# Patient Record
Sex: Male | Born: 1945 | Race: White | Hispanic: No | Marital: Single | Smoking: Current every day smoker
Health system: Southern US, Community
[De-identification: ages and names within clinical notes are randomized; demographics above are authoritative.]

## PROBLEM LIST (undated history)

## (undated) DIAGNOSIS — J449 Chronic obstructive pulmonary disease, unspecified: Secondary | ICD-10-CM

## (undated) DIAGNOSIS — I10 Essential (primary) hypertension: Secondary | ICD-10-CM

## (undated) DIAGNOSIS — N183 Chronic kidney disease, stage 3 unspecified: Secondary | ICD-10-CM

## (undated) DIAGNOSIS — Z8719 Personal history of other diseases of the digestive system: Secondary | ICD-10-CM

## (undated) DIAGNOSIS — K449 Diaphragmatic hernia without obstruction or gangrene: Secondary | ICD-10-CM

## (undated) DIAGNOSIS — I2510 Atherosclerotic heart disease of native coronary artery without angina pectoris: Secondary | ICD-10-CM

## (undated) DIAGNOSIS — I739 Peripheral vascular disease, unspecified: Secondary | ICD-10-CM

## (undated) DIAGNOSIS — G709 Myoneural disorder, unspecified: Secondary | ICD-10-CM

## (undated) DIAGNOSIS — N401 Enlarged prostate with lower urinary tract symptoms: Secondary | ICD-10-CM

## (undated) DIAGNOSIS — K5730 Diverticulosis of large intestine without perforation or abscess without bleeding: Secondary | ICD-10-CM

## (undated) DIAGNOSIS — N189 Chronic kidney disease, unspecified: Secondary | ICD-10-CM

## (undated) DIAGNOSIS — T8859XA Other complications of anesthesia, initial encounter: Secondary | ICD-10-CM

## (undated) DIAGNOSIS — T4145XA Adverse effect of unspecified anesthetic, initial encounter: Secondary | ICD-10-CM

## (undated) DIAGNOSIS — Z87442 Personal history of urinary calculi: Secondary | ICD-10-CM

## (undated) DIAGNOSIS — R600 Localized edema: Secondary | ICD-10-CM

## (undated) DIAGNOSIS — K118 Other diseases of salivary glands: Secondary | ICD-10-CM

## (undated) DIAGNOSIS — I499 Cardiac arrhythmia, unspecified: Secondary | ICD-10-CM

## (undated) DIAGNOSIS — E785 Hyperlipidemia, unspecified: Secondary | ICD-10-CM

## (undated) DIAGNOSIS — G4730 Sleep apnea, unspecified: Secondary | ICD-10-CM

## (undated) DIAGNOSIS — Z87898 Personal history of other specified conditions: Secondary | ICD-10-CM

## (undated) DIAGNOSIS — Z860101 Personal history of adenomatous and serrated colon polyps: Secondary | ICD-10-CM

## (undated) DIAGNOSIS — I701 Atherosclerosis of renal artery: Secondary | ICD-10-CM

## (undated) DIAGNOSIS — R0600 Dyspnea, unspecified: Secondary | ICD-10-CM

## (undated) DIAGNOSIS — Z85820 Personal history of malignant melanoma of skin: Secondary | ICD-10-CM

## (undated) DIAGNOSIS — M1990 Unspecified osteoarthritis, unspecified site: Secondary | ICD-10-CM

## (undated) DIAGNOSIS — Z86010 Personal history of colonic polyps: Secondary | ICD-10-CM

## (undated) DIAGNOSIS — I493 Ventricular premature depolarization: Secondary | ICD-10-CM

## (undated) DIAGNOSIS — K219 Gastro-esophageal reflux disease without esophagitis: Secondary | ICD-10-CM

## (undated) DIAGNOSIS — I4891 Unspecified atrial fibrillation: Secondary | ICD-10-CM

## (undated) DIAGNOSIS — I714 Abdominal aortic aneurysm, without rupture, unspecified: Secondary | ICD-10-CM

## (undated) DIAGNOSIS — N200 Calculus of kidney: Secondary | ICD-10-CM

## (undated) DIAGNOSIS — M5030 Other cervical disc degeneration, unspecified cervical region: Secondary | ICD-10-CM

## (undated) DIAGNOSIS — T7840XA Allergy, unspecified, initial encounter: Secondary | ICD-10-CM

## (undated) DIAGNOSIS — H269 Unspecified cataract: Secondary | ICD-10-CM

## (undated) DIAGNOSIS — J45909 Unspecified asthma, uncomplicated: Secondary | ICD-10-CM

## (undated) HISTORY — PX: TONSILLECTOMY: SHX5217

## (undated) HISTORY — PX: COLONOSCOPY: SHX174

## (undated) HISTORY — DX: Essential (primary) hypertension: I10

## (undated) HISTORY — PX: POLYPECTOMY: SHX149

## (undated) HISTORY — DX: Hyperlipidemia, unspecified: E78.5

## (undated) HISTORY — PX: ILIAC ARTERY STENT: SHX1786

## (undated) HISTORY — DX: Unspecified osteoarthritis, unspecified site: M19.90

## (undated) HISTORY — DX: Gastro-esophageal reflux disease without esophagitis: K21.9

## (undated) HISTORY — DX: Calculus of kidney: N20.0

## (undated) HISTORY — PX: CHOLECYSTECTOMY: SHX55

## (undated) HISTORY — DX: Unspecified atrial fibrillation: I48.91

## (undated) HISTORY — DX: Atherosclerotic heart disease of native coronary artery without angina pectoris: I25.10

## (undated) HISTORY — DX: Chronic obstructive pulmonary disease, unspecified: J44.9

## (undated) HISTORY — DX: Myoneural disorder, unspecified: G70.9

## (undated) HISTORY — DX: Unspecified cataract: H26.9

## (undated) HISTORY — DX: Sleep apnea, unspecified: G47.30

## (undated) HISTORY — DX: Peripheral vascular disease, unspecified: I73.9

## (undated) HISTORY — PX: TONSILLECTOMY: SUR1361

## (undated) HISTORY — DX: Unspecified asthma, uncomplicated: J45.909

## (undated) HISTORY — DX: Ventricular premature depolarization: I49.3

## (undated) HISTORY — DX: Allergy, unspecified, initial encounter: T78.40XA

## (undated) HISTORY — PX: CHOLECYSTECTOMY, LAPAROSCOPIC: SHX56

---
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
## 1972-12-19 HISTORY — PX: APPENDECTOMY: SHX54

## 1992-12-19 DIAGNOSIS — Z955 Presence of coronary angioplasty implant and graft: Secondary | ICD-10-CM

## 1992-12-19 DIAGNOSIS — I2510 Atherosclerotic heart disease of native coronary artery without angina pectoris: Secondary | ICD-10-CM

## 1992-12-19 HISTORY — PX: CORONARY ANGIOPLASTY WITH STENT PLACEMENT: SHX49

## 1992-12-19 HISTORY — DX: Presence of coronary angioplasty implant and graft: Z95.5

## 1992-12-19 HISTORY — DX: Atherosclerotic heart disease of native coronary artery without angina pectoris: I25.10

## 1998-12-19 DIAGNOSIS — G4733 Obstructive sleep apnea (adult) (pediatric): Secondary | ICD-10-CM

## 1998-12-19 HISTORY — DX: Obstructive sleep apnea (adult) (pediatric): G47.33

## 1999-04-06 ENCOUNTER — Ambulatory Visit: Admission: RE | Admit: 1999-04-06 | Discharge: 1999-04-06 | Payer: Self-pay | Admitting: Internal Medicine

## 1999-07-19 ENCOUNTER — Ambulatory Visit: Admission: RE | Admit: 1999-07-19 | Discharge: 1999-07-19 | Payer: Self-pay | Admitting: Internal Medicine

## 1999-09-19 ENCOUNTER — Inpatient Hospital Stay (HOSPITAL_COMMUNITY): Admission: EM | Admit: 1999-09-19 | Discharge: 1999-09-21 | Payer: Self-pay | Admitting: *Deleted

## 1999-09-19 ENCOUNTER — Encounter: Payer: Self-pay | Admitting: *Deleted

## 2001-04-02 ENCOUNTER — Encounter: Admission: RE | Admit: 2001-04-02 | Discharge: 2001-07-01 | Payer: Self-pay | Admitting: Internal Medicine

## 2001-11-06 ENCOUNTER — Encounter: Admission: RE | Admit: 2001-11-06 | Discharge: 2002-02-04 | Payer: Self-pay | Admitting: Internal Medicine

## 2002-04-17 ENCOUNTER — Encounter: Admission: RE | Admit: 2002-04-17 | Discharge: 2002-07-16 | Payer: Self-pay | Admitting: Internal Medicine

## 2002-06-04 ENCOUNTER — Encounter (INDEPENDENT_AMBULATORY_CARE_PROVIDER_SITE_OTHER): Payer: Self-pay | Admitting: Specialist

## 2002-06-04 ENCOUNTER — Ambulatory Visit (HOSPITAL_COMMUNITY): Admission: RE | Admit: 2002-06-04 | Discharge: 2002-06-04 | Payer: Self-pay | Admitting: Internal Medicine

## 2002-06-04 ENCOUNTER — Encounter: Payer: Self-pay | Admitting: Internal Medicine

## 2002-06-04 DIAGNOSIS — K648 Other hemorrhoids: Secondary | ICD-10-CM | POA: Insufficient documentation

## 2002-10-04 ENCOUNTER — Emergency Department (HOSPITAL_COMMUNITY): Admission: EM | Admit: 2002-10-04 | Discharge: 2002-10-04 | Payer: Self-pay | Admitting: Emergency Medicine

## 2002-10-04 ENCOUNTER — Encounter: Payer: Self-pay | Admitting: Emergency Medicine

## 2002-10-06 ENCOUNTER — Emergency Department (HOSPITAL_COMMUNITY): Admission: EM | Admit: 2002-10-06 | Discharge: 2002-10-06 | Payer: Self-pay | Admitting: Emergency Medicine

## 2002-12-19 HISTORY — PX: UMBILICAL HERNIA REPAIR: SHX196

## 2003-08-07 ENCOUNTER — Emergency Department (HOSPITAL_COMMUNITY): Admission: AD | Admit: 2003-08-07 | Discharge: 2003-08-07 | Payer: Self-pay | Admitting: Emergency Medicine

## 2003-08-07 ENCOUNTER — Encounter: Payer: Self-pay | Admitting: Emergency Medicine

## 2003-08-11 ENCOUNTER — Encounter: Payer: Self-pay | Admitting: *Deleted

## 2003-08-11 ENCOUNTER — Emergency Department (HOSPITAL_COMMUNITY): Admission: EM | Admit: 2003-08-11 | Discharge: 2003-08-11 | Payer: Self-pay | Admitting: *Deleted

## 2003-08-23 ENCOUNTER — Emergency Department (HOSPITAL_COMMUNITY): Admission: EM | Admit: 2003-08-23 | Discharge: 2003-08-23 | Payer: Self-pay | Admitting: Emergency Medicine

## 2003-08-23 ENCOUNTER — Encounter: Payer: Self-pay | Admitting: Emergency Medicine

## 2003-12-20 DIAGNOSIS — I2089 Other forms of angina pectoris: Secondary | ICD-10-CM

## 2003-12-20 DIAGNOSIS — I209 Angina pectoris, unspecified: Secondary | ICD-10-CM

## 2003-12-20 HISTORY — PX: OTHER SURGICAL HISTORY: SHX169

## 2003-12-20 HISTORY — DX: Angina pectoris, unspecified: I20.9

## 2003-12-20 HISTORY — DX: Other forms of angina pectoris: I20.89

## 2004-06-28 ENCOUNTER — Encounter: Admission: RE | Admit: 2004-06-28 | Discharge: 2004-06-28 | Payer: Self-pay | Admitting: Neurosurgery

## 2004-07-13 ENCOUNTER — Encounter: Admission: RE | Admit: 2004-07-13 | Discharge: 2004-07-13 | Payer: Self-pay | Admitting: Neurosurgery

## 2004-07-15 ENCOUNTER — Encounter: Admission: RE | Admit: 2004-07-15 | Discharge: 2004-07-15 | Payer: Self-pay | Admitting: Neurosurgery

## 2004-08-02 ENCOUNTER — Encounter: Admission: RE | Admit: 2004-08-02 | Discharge: 2004-08-02 | Payer: Self-pay | Admitting: Neurosurgery

## 2004-09-07 ENCOUNTER — Ambulatory Visit (HOSPITAL_COMMUNITY): Admission: RE | Admit: 2004-09-07 | Discharge: 2004-09-08 | Payer: Self-pay | Admitting: Neurosurgery

## 2004-09-07 HISTORY — PX: LUMBAR LAMINECTOMY: SHX95

## 2005-05-18 ENCOUNTER — Ambulatory Visit (HOSPITAL_COMMUNITY): Admission: RE | Admit: 2005-05-18 | Discharge: 2005-05-18 | Payer: Self-pay | Admitting: Urology

## 2005-05-18 HISTORY — PX: CYSTOSCOPY/URETEROSCOPY/HOLMIUM LASER/STENT PLACEMENT: SHX6546

## 2005-07-18 ENCOUNTER — Ambulatory Visit: Payer: Self-pay | Admitting: Internal Medicine

## 2005-08-11 ENCOUNTER — Ambulatory Visit: Payer: Self-pay | Admitting: Internal Medicine

## 2005-08-11 ENCOUNTER — Encounter (INDEPENDENT_AMBULATORY_CARE_PROVIDER_SITE_OTHER): Payer: Self-pay | Admitting: Specialist

## 2005-08-11 DIAGNOSIS — K5731 Diverticulosis of large intestine without perforation or abscess with bleeding: Secondary | ICD-10-CM | POA: Insufficient documentation

## 2005-08-11 DIAGNOSIS — D126 Benign neoplasm of colon, unspecified: Secondary | ICD-10-CM

## 2005-08-11 DIAGNOSIS — K5730 Diverticulosis of large intestine without perforation or abscess without bleeding: Secondary | ICD-10-CM

## 2005-10-07 ENCOUNTER — Ambulatory Visit: Payer: Self-pay | Admitting: Internal Medicine

## 2005-10-14 ENCOUNTER — Ambulatory Visit: Payer: Self-pay | Admitting: Internal Medicine

## 2006-10-28 ENCOUNTER — Emergency Department (HOSPITAL_COMMUNITY): Admission: EM | Admit: 2006-10-28 | Discharge: 2006-10-28 | Payer: Self-pay | Admitting: Emergency Medicine

## 2007-01-23 ENCOUNTER — Ambulatory Visit: Payer: Self-pay | Admitting: Vascular Surgery

## 2007-05-29 ENCOUNTER — Encounter: Payer: Self-pay | Admitting: Internal Medicine

## 2007-06-12 ENCOUNTER — Ambulatory Visit: Payer: Self-pay | Admitting: Internal Medicine

## 2007-06-12 LAB — CONVERTED CEMR LAB
Bilirubin Urine: NEGATIVE
Eosinophils Absolute: 0.3 10*3/uL (ref 0.0–0.6)
HCT: 49.2 % (ref 39.0–52.0)
Hemoglobin: 16.7 g/dL (ref 13.0–17.0)
Ketones, ur: NEGATIVE mg/dL
Leukocytes, UA: NEGATIVE
Lipase: 27 units/L (ref 11.0–59.0)
Monocytes Absolute: 0.8 10*3/uL — ABNORMAL HIGH (ref 0.2–0.7)
Monocytes Relative: 10.4 % (ref 3.0–11.0)
Mucus, UA: NEGATIVE
Neutro Abs: 4.3 10*3/uL (ref 1.4–7.7)
Neutrophils Relative %: 55.6 % (ref 43.0–77.0)
Total Protein, Urine: NEGATIVE mg/dL
Urobilinogen, UA: 0.2 (ref 0.0–1.0)
WBC: 7.8 10*3/uL (ref 4.5–10.5)
pH: 6 (ref 5.0–8.0)

## 2007-06-15 ENCOUNTER — Ambulatory Visit: Payer: Self-pay | Admitting: Gastroenterology

## 2007-06-15 DIAGNOSIS — N281 Cyst of kidney, acquired: Secondary | ICD-10-CM

## 2007-08-01 ENCOUNTER — Ambulatory Visit: Payer: Self-pay | Admitting: Vascular Surgery

## 2007-08-08 ENCOUNTER — Ambulatory Visit: Payer: Self-pay | Admitting: Vascular Surgery

## 2008-02-27 ENCOUNTER — Encounter: Payer: Self-pay | Admitting: Internal Medicine

## 2008-02-29 ENCOUNTER — Ambulatory Visit: Payer: Self-pay | Admitting: Internal Medicine

## 2008-02-29 DIAGNOSIS — R911 Solitary pulmonary nodule: Secondary | ICD-10-CM

## 2008-03-01 DIAGNOSIS — G4733 Obstructive sleep apnea (adult) (pediatric): Secondary | ICD-10-CM

## 2008-03-05 ENCOUNTER — Telehealth: Payer: Self-pay | Admitting: Internal Medicine

## 2008-03-07 ENCOUNTER — Telehealth: Payer: Self-pay | Admitting: Internal Medicine

## 2008-04-11 DIAGNOSIS — I2510 Atherosclerotic heart disease of native coronary artery without angina pectoris: Secondary | ICD-10-CM

## 2008-04-11 DIAGNOSIS — I10 Essential (primary) hypertension: Secondary | ICD-10-CM | POA: Insufficient documentation

## 2008-04-11 DIAGNOSIS — K6289 Other specified diseases of anus and rectum: Secondary | ICD-10-CM

## 2008-04-11 DIAGNOSIS — E785 Hyperlipidemia, unspecified: Secondary | ICD-10-CM | POA: Insufficient documentation

## 2008-04-16 ENCOUNTER — Ambulatory Visit (HOSPITAL_COMMUNITY): Admission: RE | Admit: 2008-04-16 | Discharge: 2008-04-16 | Payer: Self-pay | Admitting: Internal Medicine

## 2008-05-15 ENCOUNTER — Telehealth: Payer: Self-pay | Admitting: Internal Medicine

## 2008-05-16 ENCOUNTER — Ambulatory Visit: Payer: Self-pay | Admitting: Internal Medicine

## 2008-05-16 LAB — CONVERTED CEMR LAB
Basophils Absolute: 0.1 10*3/uL (ref 0.0–0.1)
Eosinophils Absolute: 0.3 10*3/uL (ref 0.0–0.7)
Eosinophils Relative: 2.8 % (ref 0.0–5.0)
Hemoglobin: 16.8 g/dL (ref 13.0–17.0)
MCV: 92 fL (ref 78.0–100.0)
Monocytes Relative: 6 % (ref 3.0–12.0)
Neutrophils Relative %: 68.1 % (ref 43.0–77.0)
Platelets: 199 10*3/uL (ref 150–400)

## 2008-05-19 ENCOUNTER — Telehealth (INDEPENDENT_AMBULATORY_CARE_PROVIDER_SITE_OTHER): Payer: Self-pay | Admitting: *Deleted

## 2008-05-20 ENCOUNTER — Ambulatory Visit: Payer: Self-pay | Admitting: Gastroenterology

## 2008-05-20 DIAGNOSIS — K219 Gastro-esophageal reflux disease without esophagitis: Secondary | ICD-10-CM

## 2008-05-20 DIAGNOSIS — R1013 Epigastric pain: Secondary | ICD-10-CM | POA: Insufficient documentation

## 2008-05-22 ENCOUNTER — Ambulatory Visit: Payer: Self-pay | Admitting: Internal Medicine

## 2008-08-01 ENCOUNTER — Ambulatory Visit: Payer: Self-pay | Admitting: Vascular Surgery

## 2008-08-11 ENCOUNTER — Ambulatory Visit: Payer: Self-pay | Admitting: Internal Medicine

## 2008-08-19 ENCOUNTER — Encounter: Payer: Self-pay | Admitting: Internal Medicine

## 2008-09-11 ENCOUNTER — Ambulatory Visit: Payer: Self-pay | Admitting: Internal Medicine

## 2008-10-02 ENCOUNTER — Telehealth (INDEPENDENT_AMBULATORY_CARE_PROVIDER_SITE_OTHER): Payer: Self-pay | Admitting: *Deleted

## 2008-11-15 ENCOUNTER — Emergency Department (HOSPITAL_COMMUNITY): Admission: EM | Admit: 2008-11-15 | Discharge: 2008-11-15 | Payer: Self-pay | Admitting: Emergency Medicine

## 2008-12-03 ENCOUNTER — Ambulatory Visit (HOSPITAL_COMMUNITY): Admission: RE | Admit: 2008-12-03 | Discharge: 2008-12-04 | Payer: Self-pay | Admitting: General Surgery

## 2008-12-03 HISTORY — PX: LAPAROSCOPY ABDOMEN DIAGNOSTIC: PRO50

## 2009-03-10 ENCOUNTER — Ambulatory Visit: Payer: Self-pay | Admitting: Internal Medicine

## 2009-03-10 DIAGNOSIS — J309 Allergic rhinitis, unspecified: Secondary | ICD-10-CM

## 2009-03-10 DIAGNOSIS — F17200 Nicotine dependence, unspecified, uncomplicated: Secondary | ICD-10-CM

## 2009-03-23 ENCOUNTER — Ambulatory Visit: Payer: Self-pay | Admitting: Internal Medicine

## 2009-04-03 ENCOUNTER — Encounter
Admission: RE | Admit: 2009-04-03 | Discharge: 2009-04-03 | Payer: Self-pay | Admitting: Physical Medicine and Rehabilitation

## 2009-04-06 ENCOUNTER — Encounter: Payer: Self-pay | Admitting: Internal Medicine

## 2009-04-15 ENCOUNTER — Ambulatory Visit: Payer: Self-pay | Admitting: Internal Medicine

## 2009-04-15 DIAGNOSIS — J449 Chronic obstructive pulmonary disease, unspecified: Secondary | ICD-10-CM

## 2009-04-22 ENCOUNTER — Ambulatory Visit: Payer: Self-pay

## 2009-06-01 ENCOUNTER — Encounter: Admission: RE | Admit: 2009-06-01 | Discharge: 2009-06-01 | Payer: Self-pay | Admitting: Specialist

## 2009-10-13 ENCOUNTER — Ambulatory Visit: Payer: Self-pay | Admitting: Internal Medicine

## 2010-04-02 ENCOUNTER — Ambulatory Visit: Payer: Self-pay | Admitting: Internal Medicine

## 2010-06-18 ENCOUNTER — Telehealth: Payer: Self-pay | Admitting: Physician Assistant

## 2010-07-06 ENCOUNTER — Encounter: Payer: Self-pay | Admitting: Internal Medicine

## 2010-09-24 ENCOUNTER — Emergency Department (HOSPITAL_COMMUNITY): Admission: EM | Admit: 2010-09-24 | Discharge: 2010-09-24 | Payer: Self-pay | Admitting: Emergency Medicine

## 2010-09-24 ENCOUNTER — Encounter: Payer: Self-pay | Admitting: Internal Medicine

## 2010-09-29 ENCOUNTER — Ambulatory Visit: Payer: Self-pay | Admitting: Internal Medicine

## 2010-10-05 ENCOUNTER — Ambulatory Visit: Payer: Self-pay | Admitting: Vascular Surgery

## 2011-01-18 NOTE — Progress Notes (Signed)
Summary: Pantoprazole Prior Auth  Phone Note Outgoing Call   Call placed by: Madlyn Frankel CMA ([place]),  June 18, 2010 1:20 PM Call placed to: Pharmacy Summary of Call: I recieved a fax from [employer] requesting prior authorization for pantoprazole. Actually, looks like patient should have never gotten 6 refills anyways as she has not been seen since 05/20/08 by Nicoletta Ba, PA-C and she has not been seen by her physician, Dr Scarlette Shorts since 2008. I have declined to do a prior authorization on patient's pantoprazole until he comes for an office visit.  Initial call taken by: Madlyn Frankel CMA (AAMA),  June 18, 2010 1:25 PM

## 2011-01-18 NOTE — Assessment & Plan Note (Signed)
Summary: 6 months/ mbw   Copy to:  Reynaldo Minium Primary Provider/Referring Provider:  Reynaldo Minium  CC:  6 month follow up visit-no complaints.  History of Present Illness: 03/10/09- Lung nodules, COPD, Tobacco, rhinits , OSA Much better on latest trip to [place]. Head stopped up on return. Has 2 dogs. Mostly nasal but some chest tight. smokes. Denies change in chest. Likes cpap at current pressure 17. Sleeps through night. Feels diaphragmatic crowding short of breath.    04/15/09- [place] nodules, COPD, Tobacco, rhinitis, OSA CXR reviewed- Do not see the smal nodules previously described on CT. Discussed issue of resolution. Ok to f/u CXR. He feels ok. Pollen is "awfull" with head congestion and some chest congestion. PFT reviewed- Moderate to severe obstruction without response to dilator- Emphysema pattern developing. Implications and urgency od smoking cessation discussed.  October 13, 2009- Hx lung nodules, COPD, Tobacco, rhinitis, OSA Tried hypnosis- didn't help. Chantix worked well but caused bad dreams and GI upset. Patches cause rash. Tried Welbutrin. Denies change in dyspnea, chest pain, palpitation, discolored sputum. He discussed bloating and gas discomforts with Dr Reynaldo Minium. Compliant with CPAP.  April 02, 2010- Hx lung nodules, COPD, Tobacco, rhinitis, OSA CPAP- Continues compliant at 17, used all night every night.[name]. Got a battery on line for travel use. He denies daytime somnolence. Smoking still. He hopes that he will quit while on upcoming trip to [place]. Daily cough is routine, mucus usually clear. Pollen turns it a little yellow. Swallowing more mucus causes stomach aches.  He is doing rescue Proventil two times a day maintenance. Steroid mdi caused yeast.     Current Medications (verified): 1)  Imdur 60 Mg  Tb24 (Isosorbide Mononitrate) .... Take 1 By Mouth Once Daily 2)  Cartia Xt 240 Mg  Cp24 (Diltiazem Hcl Coated Beads) .... Take 1 By Mouth Once Daily 3)  Trental  400 Mg  Tbcr (Pentoxifylline) .... Take 1 By Mouth Once Daily 4)  Lipitor 10 Mg  Tabs (Atorvastatin Calcium) .... Take 1 By Mouth Once Daily 5)  Bayer Aspirin 325 Mg  Tabs (Aspirin) .... Take 1 By Mouth Once Daily 6)  Lisinopril 20 Mg  Tabs (Lisinopril) .... Take 1 By Mouth Once Daily 7)  Finasteride 5 Mg  Tabs (Finasteride) .... Take 1 By Mouth Once Daily 8)  Proventil Hfa 108 (90 Base) Mcg/act  Aers (Albuterol Sulfate) .... 2 Puffs Two Times A Day 9)  Pantoprazole Sodium 40 Mg  Tbec (Pantoprazole Sodium) .... Take 1 By Mouth Once Daily 10)  Nitro-Dur 0.4 Mg/hr  Pt24 (Nitroglycerin) .... As Needed 11)  Nicoderm Cq 21 Mg/24hr Pt24 (Nicotine) .... Use As Directed 12)  Cpap 17 13)  Fluticasone Propionate 50 Mcg/act Susp (Fluticasone Propionate) .Marland Kitchen.. 1-2 Sprays Each Nostril Daily  Allergies (verified): 1)  ! Pcn  Past History:  Past Medical History: Last updated: 03/10/2009 Kidney Stone CAD,S/P ANGIOPLASTY AND STENTING X3 1994, SLEEP APNEA, HTN, HYPERLIPIDEMIA Lumbar disk surgery L3-4-5  Past Surgical History: Last updated: 04/11/2008 Coronary stent-1994 lumbar discectomy L3-4-5  2005 Cholecystectomy (1990's) Appendectomy  Angioplasty & stent placement (Q000111Q) Umbilical Hernia Repair Tonsillectomy   Family History: Last updated: 2008-03-23 Father ETOH Mother died renal cancer  Social History: Last updated: 09/11/2008 Patient is a current smoker.- 2 ppd Travelled to [place] and [place] 2008, [place] 2009 Retired Pharmacist, 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  The patient denies anorexia, fever, weight loss, weight gain, vision loss, decreased hearing, hoarseness, chest pain, syncope, peripheral edema, headaches, hemoptysis, abdominal pain, and severe indigestion/heartburn.    Vital  Signs:  Patient profile:   65 year old male Height:      65.5 inches Weight:      217.38 pounds BMI:     35.75 O2 Sat:      96 % on Room air Pulse rate:   68 / minute BP sitting:   130 / 64  (left arm) Cuff size:   large  Vitals Entered By: Clayborne Dana CMA (April 02, 2010 10:01 AM)  O2 Flow:  Room air  Physical Exam  Additional Exam:  General: A/Ox3; pleasant and cooperative, NAD, obese SKIN: no rash, lesions NODES: no lymphadenopathy HEENT: [place]/AT, EOM- WNL, Conjuctivae- clear, PERRLA, TM-WNL, Nose- clear, Throat- clear and wnl, Mallampati  III NECK: Supple w/ fair ROM, JVD- none, normal carotid impulses w/o bruits  CHEST: fdiminished butr quiet and unlabored HEART: RRR, no m/g/r heard ABDOMEN: Soft and nl; nml bowel sounds; no organomegaly or masses noted; significantly crowded by his abdominal girth [id number], nl pulses, no edema  NEURO: Grossly intact to observation      Impression & Recommendations:  Problem # 1:  OBSTRUCTIVE SLEEP APNEA (ICD-327.23)  Good compliance and control with CPAP.   Problem # 2:  LUNG NODULE (ICD-518.89)  We will update CXR  Problem # 3:  COPD ([id number]) He has an asthmatic bronchitis pattern with his COPD and is ok staying with maintenance bronchodilator for now without a steroid inhaler as discused. We are giving a Z pak to cary on his trip to [place].  Problem # 4:  TOBACCO USER (ICD-305.1)  Effort again made to discuss the medical issues, support and techniques available for cessation. His updated medication list for this problem includes:    Nicoderm Cq 21 Mg/24hr Pt24 (Nicotine) ..... Use as directed  Medications Added to Medication List This Visit: 1)  Cpap 17 [employer]  2)  Zithromax Z-pak 250 Mg Tabs (Azithromycin) .... 2 today then one daily  Other Orders: Est. Patient Level III [id number]) T-2 View CXR (71020TC) Tobacco use cessation intermediate 3-10 minutes [id number])  Patient Instructions: 1)  Please schedule a follow-up  appointment in 6 months. 2)  Script for Z pak sent to your drug store 3)  Please keep trying to stop smoking. Make it a present you give to yourself. 4)  A chest x-ray has been recommended.  Your imaging study may require preauthorization.  Prescriptions: ZITHROMAX Z-PAK 250 MG TABS (AZITHROMYCIN) 2 today then one daily  #1 pak x 0   Entered and Authorized by:   Deneise Lever MD   Signed by:   Deneise Lever MD on 04/02/2010   Method used:   Electronically to        [facility] [phone]* (retail)       [place]       [place],   [address]       Ph: [phone] or [phone]       Fax: [phone]   RxID:   [id number]

## 2011-01-18 NOTE — Assessment & Plan Note (Signed)
Summary: 6 months/apc   Copy to:  Dean [name] Primary Dean [name]/Referring Dean [name]:  Dean [name]  CC:  6 month follow up visit-COPD; Flu shot today.Marland Kitchen  History of Present Illness:  October 13, 2009- Hx lung nodules, COPD, Tobacco, rhinitis, OSA Tried hypnosis- didn't help. Chantix worked well but caused bad dreams and GI upset. Patches cause rash. Tried Welbutrin. Denies change in dyspnea, chest pain, palpitation, discolored sputum. He discussed bloating and gas discomforts with Dr Dean [name]. Compliant with CPAP.  April 02, 2010- Hx lung nodules, COPD, Tobacco, rhinitis, OSA CPAP- Continues compliant at 17, used all night every night.Dean [name]. Got a battery on line for travel use. He denies daytime somnolence. Smoking still. He hopes that he will quit while on upcoming trip to [place]. Daily cough is routine, mucus usually clear. Pollen turns it a little yellow. Swallowing more mucus causes stomach aches.  He is doing rescue Proventil two times a day maintenance. Steroid mdi caused yeast.  September 29, 2010-  Hx lung nodules, COPD, Tobacco, rhinitis, OSA Nurse CC:6 month follow up visit-COPD; Flu shot today. Still doing well with CPAP.  Still fighting cigarettes. Failed hypnosis, Chantix. Will give Dean [name] support information. Daily cough, clear mucus; sinus drainage. Sensitive to seasonal pollens. Had chest pain last week and Dr Dean [name] had CXR at [facility]- PFT- April/ 2010- FEV!/FVC 0.48. Discussed.   Preventive Screening-Counseling & Management  Alcohol-Tobacco     Smoking Status: current     Smoking Cessation Counseling: yes     Packs/Day: 2ppd     Tobacco Counseling: to quit use of tobacco products  Current Medications (verified): 1)  Imdur 60 Mg  Tb24 (Isosorbide Mononitrate) .... Take 1 By Mouth Once Daily 2)  Cartia Xt 240 Mg  Cp24 (Diltiazem Hcl Coated Beads) .... Take 1 By Mouth Once Daily 3)  Trental 400 Mg  Tbcr (Pentoxifylline) .... Take 1 By Mouth Once Daily 4)  Lipitor 10 Mg  Tabs  (Atorvastatin Calcium) .... Take 1 By Mouth Once Daily 5)  Bayer Aspirin 325 Mg  Tabs (Aspirin) .... Take 1 By Mouth Once Daily 6)  Lisinopril 20 Mg  Tabs (Lisinopril) .... Take 1 By Mouth Once Daily 7)  Finasteride 5 Mg  Tabs (Finasteride) .... Take 1 By Mouth Once Daily 8)  Proventil Hfa 108 (90 Base) Mcg/act  Aers (Albuterol Sulfate) .... 2 Puffs Two Times A Day 9)  Dexilant 60 Mg Cpdr (Dexlansoprazole) .... Take 1 By Mouth Once Daily 10)  Nitro-Dur 0.4 Mg/hr  Pt24 (Nitroglycerin) .... As Needed 11)  Nicoderm Cq 21 Mg/24hr Pt24 (Nicotine) .... Use As Directed 12)  Cpap 17 [employer] 13)  Fluticasone Propionate 50 Mcg/act Susp (Fluticasone Propionate) .Marland Kitchen.. 1-2 Sprays Each Nostril Daily  Allergies (verified): 1)  ! Pcn  Past History:  Past Medical History: Last updated: 03/10/2009 Kidney Stone CAD,S/P ANGIOPLASTY AND STENTING X3 1994, SLEEP APNEA, HTN, HYPERLIPIDEMIA Lumbar disk surgery L3-4-5  Past Surgical History: Last updated: 04/11/2008 Coronary stent-1994 lumbar discectomy L3-4-5  2005 Cholecystectomy (1990's) Appendectomy  Angioplasty & stent placement (Q000111Q) Umbilical Hernia Repair Tonsillectomy   Family History: Last updated: 03/08/08 Father ETOH Mother died renal cancer  Social History: Last updated: 09/11/2008 Patient is a current smoker.- 2 ppd Travelled to [place] and [place] 2008, [place] 2009 Retired Pharmacist, 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  The patient denies shortness of breath at rest, coughing up blood, chest pain, irregular heartbeats, acid heartburn, indigestion, loss of appetite, weight change, abdominal pain, difficulty swallowing, sore throat, tooth/dental  problems, headaches, nasal congestion/difficulty breathing through nose, sneezing, itching, ear ache, hand/feet swelling, rash, change in color of mucus, and fever.    Vital Signs:  Patient profile:   65 year old male Height:      65.5 inches Weight:      216.13 pounds BMI:     35.55 O2 Sat:      93 % on Room air Pulse rate:   68 / minute BP sitting:   122 / 64  (left arm) Cuff size:   regular  Vitals Entered By: Dean [name] CMA (September 29, 2010 9:37 AM)  O2 Flow:  Room air CC: 6 month follow up visit-COPD; Flu shot today.   Physical Exam  Additional Exam:  General: A/Ox3; pleasant and cooperative, NAD, obese SKIN: no rash, lesions NODES: no lymphadenopathy HEENT: [place]/AT, EOM- WNL, Conjuctivae- clear, PERRLA, TM-WNL, Nose- clear, Throat- clear and wnl, Mallampati  III NECK: Supple w/ fair ROM, JVD- none, normal carotid impulses w/o bruits  CHEST:unlabored, mild cough, no wheeze or dullness HEART: RRR, no m/g/r heard ABDOMEN: Soft and nl; nml bowel sounds; no organomegaly or masses noted; significantly crowded by his abdominal girth [id number], nl pulses, no edema  NEURO: Grossly intact to observation      Impression & Recommendations:  Problem # 1:  OBSTRUCTIVE SLEEP APNEA (ICD-327.23)  Good compliance and control. Weight loss emphasized as this would help substantially.  Problem # 2:  COPD ([id number]) Moderately severe COPD with no med changes needed. Walking encouraged.  Problem # 3:  LUNG NODULE (ICD-518.89)  CXR to be followed up.  Problem # 4:  TOBACCO USER (ICD-305.1)  Ongoing cessation effort. His updated medication list for this problem includes:    Nicoderm Cq 21 Mg/24hr Pt24 (Nicotine) ..... Use as directed  Problem # 5:  ALLERGIC RHINITIS WITH CONJUNCTIVITIS (ICD-477.9) Offering sample of Patanase His updated medication list for this problem includes:    Fluticasone Propionate 50 Mcg/act Susp (Fluticasone propionate) .Marland Kitchen... 1-2 sprays each nostril  daily  Orders: Est. Patient Level IV [id number])  Medications Added to Medication List This Visit: 1)  Dexilant 60 Mg Cpdr (Dexlansoprazole) .... Take 1 by mouth once daily  Other Orders: Admin 1st Vaccine [id number]) Flu Vaccine 43yrs + [id number])  Patient Instructions: 1)  Please schedule a follow-up appointment in 6 months. 2)  Flu vax 3)  Sample Patanase antihistamine nasal spray: 1-2 puffs up to twice daily as needed. This can be used in addition to Fluticasone. 4)  Flyer- information on Dean [name] smoking cessation program. 5)  cc Dr Dean [name]   Flu Vaccine Consent Questions     Do you have a history of severe allergic reactions to this vaccine? no    Any prior history of allergic reactions to egg and/or gelatin? no    Do you have a sensitivity to the preservative Thimersol? no    Do you have a past history of Guillan-Barre Syndrome? no    Do you currently have an acute febrile illness? no    Have you ever had a severe reaction to latex? no    Vaccine information given and explained to patient? yes    Are you currently pregnant? no    Lot Number:[id number]   Exp  Date:06/18/2011   Site Given  Left Deltoid IMbflu Raymondo Band RN  September 29, 2010 10:17 AM

## 2011-01-18 NOTE — Letter (Signed)
Summary: Colonoscopy Letter  [facility]  [place], [place] [address]   Phone: [phone]  Fax: [phone]      July 06, 2010 MRN: [id number]   BIJAN DAUZAT [address] [place], [place]  [address]   Dear Mr. Delima,   According to your medical record, it is time for you to schedule a Colonoscopy. The American Cancer Society recommends this procedure as a method to detect early colon cancer. Patients with a family history of colon cancer, or a personal history of colon polyps or inflammatory bowel disease are at increased risk.  This letter has been generated based on the recommendations made at the time of your procedure. If you feel that in your particular situation this may no longer apply, please contact our office.  Please call our office at [phone] to schedule this appointment or to update your records at your earliest convenience.  Thank you for cooperating with [place] to provide you with the very best care possible.   Sincerely,  Docia Chuck. Henrene Pastor, M.D.  [facility] [phone]

## 2011-03-02 LAB — BASIC METABOLIC PANEL
BUN: 9 mg/dL (ref 6–23)
CO2: 27 mEq/L (ref 19–32)
Calcium: 9.5 mg/dL (ref 8.4–10.5)
Creatinine, Ser: 0.96 mg/dL (ref 0.4–1.5)
GFR calc non Af Amer: >60 mL/min (ref >60)

## 2011-03-02 LAB — DIFFERENTIAL
Basophils Relative: 1 % (ref 0–1)
Lymphocytes Relative: 24 % (ref 12–46)
Lymphs Abs: 2.3 10*3/uL (ref 0.7–4.0)
Monocytes Absolute: 1 10*3/uL (ref 0.1–1.0)
Monocytes Relative: 11 % (ref 3–12)
Neutro Abs: 5.6 10*3/uL (ref 1.7–7.7)
Neutrophils Relative %: 61 % (ref 43–77)

## 2011-03-02 LAB — CBC
HCT: 48.4 % (ref 39.0–52.0)
Hemoglobin: 17 g/dL (ref 13.0–17.0)
MCH: 32.9 pg (ref 26.0–34.0)
MCV: 93.8 fL (ref 78.0–100.0)
Platelets: 218 10*3/uL (ref 150–400)
RBC: 5.16 MIL/uL (ref 4.22–5.81)
RDW: 13.7 % (ref 11.5–15.5)

## 2011-03-02 LAB — POCT CARDIAC MARKERS
CKMB, poc: 1.2 ng/mL (ref 1.0–8.0)
Myoglobin, poc: 144 ng/mL (ref 12–200)

## 2011-03-23 ENCOUNTER — Encounter: Payer: Self-pay | Admitting: Internal Medicine

## 2011-03-28 ENCOUNTER — Ambulatory Visit: Payer: Self-pay | Admitting: Internal Medicine

## 2011-03-29 ENCOUNTER — Encounter: Payer: Self-pay | Admitting: Internal Medicine

## 2011-03-29 ENCOUNTER — Ambulatory Visit (INDEPENDENT_AMBULATORY_CARE_PROVIDER_SITE_OTHER): Payer: BC Managed Care – PPO | Admitting: Internal Medicine

## 2011-03-29 VITALS — BP 122/70 | HR 61 | Ht 65.5 in | Wt 220.8 lb

## 2011-03-29 DIAGNOSIS — G4733 Obstructive sleep apnea (adult) (pediatric): Secondary | ICD-10-CM

## 2011-03-29 DIAGNOSIS — J984 Other disorders of lung: Secondary | ICD-10-CM

## 2011-03-29 DIAGNOSIS — F17200 Nicotine dependence, unspecified, uncomplicated: Secondary | ICD-10-CM

## 2011-03-29 DIAGNOSIS — J449 Chronic obstructive pulmonary disease, unspecified: Secondary | ICD-10-CM

## 2011-03-29 DIAGNOSIS — J309 Allergic rhinitis, unspecified: Secondary | ICD-10-CM

## 2011-03-29 MED ORDER — OLOPATADINE HCL 0.6 % NA SOLN: 1.0000 | Freq: Two times a day (BID) | NASAL | Status: DC | PRN

## 2011-03-29 NOTE — Patient Instructions (Signed)
Continue CPAP  Please keep trying to stop smoking. Other people with long habits have quit- so can you.   Refill script for Patanase was sent to drug store

## 2011-03-29 NOTE — Progress Notes (Signed)
  Subjective:    Patient ID: Dean [name], male    DOB: 11-18-1946, 64 y.o.   MRN: [id number]  HPI 22 yoM , ongoing smoker despite continued counseling efforts, followed here for COPD, obstructive sleep apnea, lung nodule, allergic rhinitis. Last here September 29, 2010, when PFT reviewed for 2010- FEV1/FVC 0.48 indicating severe COPD. Did well through the winter.  Now Spring pollen season is bothering him with itching eyes, watering eyes and nose.. Ears don't pop or hurt. Chest tightness and morning cough are also worse lately- blaming pollen. Denies discolored sputum, chest pain or palpitation. Actively uncomfortable with GERD- working with Dr Henrene Pastor.      Review of Systems See HPI   Constitutional:   No weight loss, night sweats,  Fevers, chills, fatigue, lassitude. HEENT:   No headaches,  Difficulty swallowing,  Tooth/dental problems,  Sore throat,               CV:  No chest pain,  Orthopnea, PND, swelling in lower extremities, anasarca, dizziness, palpitations  GI  No heartburn, indigestion, abdominal pain, nausea, vomiting, diarrhea, change in bowel habits, loss of appetite  Resp:Mre aware of shortness of breath with exertion, not at rest.  No excess mucus,,  No coughing up of blood.  No change in color of mucus.  No wheezing.   Skin: no rash or lesions.  GU: no dysuria, change in color of urine, no urgency or frequency.  No flank pain.  MS:  No joint pain or swelling.  No decreased range of motion.  No back pain.  Psych:  No change in mood or affect. No depression or anxiety.  No memory loss.   Objective:   Physical Exam General- Alert, Oriented, Affect-appropriate, Distress- none acute. Overweight    Odor of tobacco Skin- rash-none, lesions- none, excoriation- none  Lymphadenopathy- none  Head- atraumatic  Eyes- Gross vision intact, PERRLA, conjunctivae clear secretions, mildly injected Ears- Hearing Normal canals, Tm L , R ,  Nose- Clear, No-Septal dev, mucus,  polyps, erosion, perforation   Throat- Mallampati IV , mucosa clear , drainage- none, tonsils- atrophic  Neck- flexible , trachea midline, no stridor , thyroid nl, carotid no bruit  Chest - symmetrical excursion , unlabored     Heart/CV- RRR , no murmur , no gallop  , no rub, nl s1 s2                     - JVD- none , edema- none, stasis changes- none, varices- none     Lung- clear to P&A, distant.  wheeze- none, cough- none , dullness-none, rub- none     Chest wall- Abd- tender-no, distended-no, bowel sounds-present, HSM- no  Br/ Gen/ Rectal- Not done, not indicated  Extrem- cyanosis- none, clubbing, none, atrophy- none, strength- nl  Neuro- grossly intact to observation         Assessment & Plan:

## 2011-03-29 NOTE — Assessment & Plan Note (Signed)
CXR 09/24/10 showed emphysema without discrete nodule seen.

## 2011-03-29 NOTE — Assessment & Plan Note (Signed)
Definite seasonal exacerbation. We will continue flonase, refill Patanase

## 2011-03-29 NOTE — Assessment & Plan Note (Signed)
He most recdntly failed offer of referral to [facility] cessation program. Not motivated to try quitting despite my efforts.

## 2011-03-29 NOTE — Assessment & Plan Note (Signed)
Ongoing concern, but he is relatively asymptomatic within his effort range.

## 2011-04-02 ENCOUNTER — Encounter: Payer: Self-pay | Admitting: Internal Medicine

## 2011-04-02 NOTE — Assessment & Plan Note (Signed)
Remains fully compliant and successful, using CPAP 17 all night, every night- Dean [name]

## 2011-05-03 NOTE — Assessment & Plan Note (Signed)
[place] OFFICE NOTE   NAME:Scovell, KHRYSTIAN SCHMICK                    MRN:          [id number]  DATE:06/12/2007                            DOB:          [birth year]    REASON FOR CONSULTATION:  Abdominal pain and bloating.   HISTORY:  This is a 65 year old white male with multiple medical  problems including coronary artery disease, status post coronary artery  stenting, hypertension, hyperlipidemia, and sleep apnea. He also has a  history of adenomatous colon polyps for which he has undergone  colonoscopy, most recently in August 2006. He is also known to have  diverticulosis. The patient has chronic intermittent problems with  bloating. These worsened recently. A couple of weeks ago, he developed  significant right upper quadrant pain. This was constant and exacerbated  by meals and seemed worse at night. There was no associated nausea,  vomiting, fever, GI bleeding, change in urine color. He is status post  cholecystectomy and appendectomy. He had been on once daily proton pump  inhibitor therapy for bloating. This was increased to twice daily. His  symptoms seemed to resolve in about 3 to 4 days. About that time, he was  having worsening problems with constipation for which he took over-the-  counter laxatives. His discomfort seemed less after defecation. He  continues to smoke. He has had gradual weight gain of about 20 pounds  over the past 18 months.   He underwent a cardiac stress test, which was negative. He also  underwent a CT scan of the chest which was negative. He is now referred.  The patient denies a history of indigestion or heartburn. He takes daily  aspirin for his heart. He occasionally takes Advil or Aleve for back  pain. He is not certain whether the recent problems with right upper  quadrant discomfort remind him of his gallbladder type pain.   PAST MEDICAL HISTORY:  As above. In  addition, history of kidney stones.   PAST SURGICAL HISTORY:  1. Cholecystectomy.  2. Appendectomy.  3. Umbilical hernia repair.  4. Back surgery.   ALLERGIES:  PENICILLIN.   CURRENT MEDICATIONS:  1. Imdur 60 mg daily.  2. Cardizem ER 240 mg daily.  3. Trental ER 400 mg daily.  4. Lipitor 10 mg daily.  5. Ecotrin 325 mg daily.  6. Lisinopril 20 mg daily.  7. Uroxatral 10 mg daily.  8. Proventil two puffs b.i.d.  9. Protonix 40 mg b.i.d.   FAMILY HISTORY:  Non-contributory.   SOCIAL HISTORY:  Unchanged.   PHYSICAL EXAMINATION:  Well-appearing male in no acute distress. Blood  pressure 104/62, heart rate 60, weight is 210.4 pounds.  HEENT: Sclerae anicteric. Conjunctivae are pink. Oral mucosa intact. No  adenopathy.  LUNGS:  Are clear.  HEART: Is regular.  ABDOMEN: Obese and soft without tenderness, mass or hernia. Good bowel  sounds heard.   IMPRESSION:  Recent problems with constant right upper quadrant  abdominal pain. Now resolved. The patient's pain certainly could have  been related to worsening constipation, which after improved, resulted  in resolution of abdominal pain. He also  seemed to improve on proton  pump inhibitor therapy. Symptoms do not sound like reflux. It may have  been a peptic ulcer. In any event, symptoms have completely resolved.  Other possibilities for his pain include retained common bile duct stone  or kidney stone.   RECOMMENDATIONS:  1. Abdominal ultrasound to rule out biliary ductal dilation or common      duct stone.  2. Laboratories today including liver function tests, lipase and      urinalysis.  3. Continue b.i.d. proton pump inhibitor therapy for an additional two      weeks and then discontinue and observe.  4. Avoid unnecessary nonsteroidal anti-inflammatory drugs.  5. Stop smoking.  6. MiraLax as needed to alleviate constipation.  7. GI followup for recurrent problems. If problems recur, he may      require upper  endoscopy.     Docia Chuck. Henrene Pastor, MD  Electronically Signed    JNP/MedQ  DD: 06/12/2007  DT: 06/12/2007  Job #: [id number]   cc:   [name], M.D.

## 2011-05-03 NOTE — Op Note (Signed)
NAMETARVIS, MANGELS NO.:  [id number]   MEDICAL RECORD NO.:  [id number]          PATIENT TYPE:  OIB   LOCATION:  [id number]                         FACILITY:  [facility]   PHYSICIAN:  Dickey Gave, MDDATE OF BIRTH:  Mar 25, 1946   DATE OF PROCEDURE:  12/03/2008  DATE OF DISCHARGE:                               OPERATIVE REPORT   PREOPERATIVE DIAGNOSIS:  Recurrent incisional hernia.   POSTOPERATIVE DIAGNOSIS:  Recurrent incisional hernia.   PROCEDURE:  1. Diagnostic laparoscopy with lysis of adhesions.  2. Open repair umbilical hernia with Proceed ventral patch.  3. Laparoscopic tacking of Proceed ventral patch to the abdominal      wall.   SURGEON:  Rolm Bookbinder, M.D.   ASSISTANT:  None.   ANESTHESIA:  General.   FINDINGS:  A [id number] recurrent umbilical hernia with omental adhesions  present.   SPECIMENS:  None.   ESTIMATED BLOOD LOSS:  Minimal.   DRAINS:  None.   COMPLICATIONS:  None.   DISPOSITION:  To PACU in stable condition.   INDICATIONS:  This is a 65 year old male with history of umbilical  hernia repaired with mesh in 1990 that followed a laparoscopic  cholecystectomy by Dr. Leafy Kindle. He now has about a one-year history of  umbilical bulge slightly increasing in size over this time with some  intermittent pain and associated bloating as well. He underwent an EGD  and colonoscopy recently that were negative. On his examination, he had  about a [id number] umbilical hernia beside of his prior repair. I counseled  him for a diagnostic laparoscopy, possible laparoscopic versus open  umbilical hernia repair.   PROCEDURE:  After informed consent was obtained, the patient was brought  to the operating room. He was first administered intravenous  ciprofloxacin and then taken to the operating room where he had  sequential compressive devices placed on his lower extremities  throughout the operation. He then underwent general endotracheal  anesthesia  without complication. He also had 5000 units of subcutaneous  heparin given prior to surgery. His abdomen was then prepped and draped  in standard surgical fashion. A Foley catheter was inserted without  complication, as well as an orogastric tube. Charlie Pitter was placed over his  stomach. Following this, surgical time out was then performed.  I then made a 5-mm incision in his left upper quadrant and then used a 5-  mm OptiVu trocar to enter the abdomen without difficulty. This was done  without any injury. The abdomen was then insufflated to 15 mmHg  pressure. He was noted to have what appeared to be just some omental  adhesions to the site of his prior repair.  I did not see any mesh which  he reported that they had placed at the prior surgery present. Another  10-mm port was inserted in the left mid abdomen, and a combination of  scissors with no electricity and blunt dissection were then used to  dissect the adhesions. These were just omental adhesions that were  present. Following this, again, I did not see any mesh from a prior  repair, and the defect was noted to  be about 2.5 cm. I elected at this  point to make an open incision and dissect down. The umbilical stalk was  encircled and divided, and then I placed a large Proceed ventral patch  into position to cover this defect. I did tack the edges of the Proceed  ventral patch intra-abdominally to the abdominal wall to make sure this  stuck to the abdominal wall. This got good coverage of the defect. For  the open portion, I then tacked the leaves of the mesh to the fascia  after pulling them tight and then tacked them down further out as well.  Hemostasis was observed. This would was closed with 4-0 Vicryl after  tacking the belly button back down, and the skin was closed with 4-0  Vicryl. Upon inspection laparoscopically, the mesh was in good position.  There were no injuries noted, and I was very happy with the placement.  Ports were  then removed, after the abdomen was desufflated, I then  closed the 10-mm port with a 0 Vicryl suture. Ports were then closed  with 4-0 Monocryl. Steri-Strips were placed over this, and sterile  dressings were placed on all of the wounds. He tolerated this well and  was extubated in the operating room and was transferred to the recovery  room in stable condition.      Dickey Gave, MD  Electronically Signed     MCW/MEDQ  D:  12/03/2008  T:  12/03/2008  Job:  [id number]   cc:   [name], M.D.  Fax: [phone]

## 2011-05-06 NOTE — Op Note (Signed)
NAME:  Dean [name], Dean [name]             ACCOUNT NO.:  [id number]   MEDICAL RECORD NO.:  [id number]          PATIENT TYPE:  OIB   LOCATION:  2858                         FACILITY:  [facility]   PHYSICIAN:  Cooper Render. Pool, M.D.    DATE OF BIRTH:  November 22, 1946   DATE OF PROCEDURE:  09/07/2004  DATE OF DISCHARGE:                                 OPERATIVE REPORT   PREOPERATIVE DIAGNOSES:  Lumbar stenosis with neurogenic claudication.   POSTOPERATIVE DIAGNOSES:  Lumbar stenosis with neurogenic claudication.   OPERATION PERFORMED:  L3-4 and L4-5 bilateral decompressive laminotomies  with foraminotomies.   SURGEON:  Cooper Render. Pool, M.D.   ASSISTANT:  Faythe Ghee, M.D.   ANESTHESIA:  General endotracheal.   INDICATIONS FOR PROCEDURE:  [name] is a 65 year old male with a history  of back and bilateral lower extremity pain failing all conservative  management.  Work-up demonstrates evidence of significant stenosis at L3-4  and L4-5.  The patient has been counseled as to his options.  He has decided  to proceed with two-level lumbar decompression for hopeful improvement of  the symptoms.   DESCRIPTION OF PROCEDURE:  The patient was taken to the operating room and  placed on the table in the supine position.  After adequate level of  anesthesia was achieved, the patient was positioned prone onto a Wilson  frame and appropriately padded.  The patient's lumbar region was prepped and  draped sterilely.  A 10 blade was used to make a linear skin incision  overlying the L3, 4 and 5 levels.  This was carried down sharply in the  midline.  A subperiosteal dissection was then performed exposing the lamina  and facet joints of L3, L4 and L5.  Deep self-retaining retractor was  placed.  Intraoperative x-ray was taken, the levels were confirmed.  Decompressive laminotomy was then performed bilateral at L3-4 and L4-5 using  the high speed drill and Kerrison rongeurs to remove the inferior edge of  the  lamina above, the medial aspect of the facet joint and the superior  aspect of the lamina below.  The ligamentum flavum was then elevated and  resected in piecemeal fashion at all four levels.  The underlying thecal sac  and exiting nerve roots were identified.  Wide foraminotomies were performed  along the course of exiting nerve roots.   The microscope was brought into the field and used to complete the  foraminotomies.  The disk spaces were examined at L3-4 and L4-5 bilaterally.  There was no evidence of significant herniation.  There was no evidence of  injury to thecal sac or nerve roots.  The wound was then irrigated with  antibiotic solution.  Gelfoam was placed topically for hemostasis which was  found to be good.  Microscope and retractor system were removed.  Hemostasis  in the muscle was achieved with electrocautery.  The wound was then closed  in layers with Vicryl sutures.  Steri-Strips and sterile dressing were  applied.  There were no apparent complications.  The patient tolerated the  procedure well and returned to the recovery room postoperatively.  HAP/MEDQ  D:  09/07/2004  T:  09/08/2004  Job:  [id number]

## 2011-05-06 NOTE — Op Note (Signed)
NAME:  Dean [name], Dean [name]             ACCOUNT NO.:  [id number]   MEDICAL RECORD NO.:  [id number]          PATIENT TYPE:  AMB   LOCATION:  DAY                          FACILITY:  [facility]   PHYSICIAN:  Shanda Bumps., M.D.DATE OF BIRTH:  01/20/1946   DATE OF PROCEDURE:  05/18/2005  DATE OF DISCHARGE:                                 OPERATIVE REPORT   PREOPERATIVE DIAGNOSIS:  Left retained ureteral stone with non-progression.   POSTOPERATIVE DIAGNOSIS:  Left retained ureteral stone with non-progression.   OPERATION:  Cystoscopy, left retrograde pyelogram, left ureteroscopy with  holmium laser tripsy, insertion of left ureteral stent.   ANESTHESIA:  Spinal.   SURGEON:  Corky Downs, M.D.   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  It has been present since early April, 2006.  It measured about 5-6  mm on CT scan.  He has had some intermittent pain and hematuria and enters  to have this removed at this time prior to a trip he has planned to [place]  later this summer.   The patient was placed on the operating room table in a dorsal lithotomy  position after satisfactory induction of spinal anesthesia.  He was prepped  and draped with Betadine in the usual sterile fashion and given IV  antibiotics.  The pan endoscope was inserted.  He had a fairly large  prostate with the fairly big median lobe.  The bladder was carefully  inspected.  No bladder mucosal lesions were seen.  The left orifice was  somewhat tight, and I had to use a guidewire through the 5 open-ended  ureteral catheter  to be able to insert this into the left orifice.   An occlusive retrograde was done, demonstrating the stone at the junction of  the middle and lower third of the ureter, just below the pelvic brim with  moderate hydronephrosis proximal to this.   Under fluoroscopy,  through the open-ended catheter, a 0.38 floppy tipped  guidewire was passed under fluoroscopy up to the level of the kidney, past  the stone.  The open-ended catheter was removed.  Over the guidewire, a 10  cm ureteral balloon dilator was passed under fluoroscopy.  Initially went a  little past the stone, but then I pulled it back just to where the stone was  and inflated this to 12 atmospheres for three timed minutes.  After this, I  could leave the guidewire in place.  I could pass the scope through the  ureter and then up to the stone.  The stone was fairly large, and I could  not negotiate the scope past this, so I calibrated the holmium laser, and  then using the camera, under direct vision, the stone was broken into tiny  pieces.  The scope was then passed into the mid portion of the ureter.  Using the stone basket, several pieces were retrieved in this fashion,  leaving nothing but just some little tiny pieces.  There was a little  bit of  bleeding noted, and where the stone was present, was a little bit more  inflamed.  So I felt the need to insert a ureteral stent, which was done.  A  #6 French 24 cm length double-J ureteral stent was then passed under  fluoroscopy over the guidewire up to the level of the kidney.  As the  guidewire was removed, there was a nice coil in the renal pelvis and one in  the bladder.  It was a little high up in the kidney, and I had to pull this  back, so it was just barely outside the level of the prostate, but it seemed  to drain well at this point.  The bladder was then  drained.  The scope removed.  The patient was given a B&O suppository.  He  was taken to the recovery room in good condition, where he would be later  discharged as an outpatient with detailed written instructions.  We will  remove the stent in about five days' time.  He will be covered with  antibiotics and given  pain pills to take prn.       HMK/MEDQ  D:  05/18/2005  T:   05/18/2005  Job:  [id number]

## 2011-06-16 ENCOUNTER — Emergency Department (HOSPITAL_COMMUNITY)
Admission: EM | Admit: 2011-06-16 | Discharge: 2011-06-16 | Disposition: A | Discharge status: Home / Self Care | Payer: BC Managed Care – PPO | Attending: Emergency Medicine | Admitting: Emergency Medicine

## 2011-06-16 ENCOUNTER — Emergency Department (HOSPITAL_COMMUNITY): Payer: BC Managed Care – PPO

## 2011-06-16 DIAGNOSIS — R197 Diarrhea, unspecified: Secondary | ICD-10-CM | POA: Insufficient documentation

## 2011-06-16 DIAGNOSIS — I4891 Unspecified atrial fibrillation: Secondary | ICD-10-CM | POA: Insufficient documentation

## 2011-06-16 DIAGNOSIS — E7800 Pure hypercholesterolemia, unspecified: Secondary | ICD-10-CM | POA: Insufficient documentation

## 2011-06-16 DIAGNOSIS — K5289 Other specified noninfective gastroenteritis and colitis: Secondary | ICD-10-CM | POA: Insufficient documentation

## 2011-06-16 DIAGNOSIS — I252 Old myocardial infarction: Secondary | ICD-10-CM | POA: Insufficient documentation

## 2011-06-16 DIAGNOSIS — R10811 Right upper quadrant abdominal tenderness: Secondary | ICD-10-CM | POA: Insufficient documentation

## 2011-06-16 DIAGNOSIS — I10 Essential (primary) hypertension: Secondary | ICD-10-CM | POA: Insufficient documentation

## 2011-06-16 DIAGNOSIS — R112 Nausea with vomiting, unspecified: Secondary | ICD-10-CM | POA: Insufficient documentation

## 2011-06-16 DIAGNOSIS — Z79899 Other long term (current) drug therapy: Secondary | ICD-10-CM | POA: Insufficient documentation

## 2011-06-16 DIAGNOSIS — R0602 Shortness of breath: Secondary | ICD-10-CM | POA: Insufficient documentation

## 2011-06-16 DIAGNOSIS — Z0389 Encounter for observation for other suspected diseases and conditions ruled out: Secondary | ICD-10-CM | POA: Insufficient documentation

## 2011-06-16 LAB — URINE MICROSCOPIC-ADD ON

## 2011-06-16 LAB — CBC
Platelets: 192 10*3/uL (ref 150–400)
RBC: 5.35 MIL/uL (ref 4.22–5.81)
WBC: 14.9 10*3/uL — ABNORMAL HIGH (ref 4.0–10.5)

## 2011-06-16 LAB — COMPREHENSIVE METABOLIC PANEL
ALT: 30 U/L (ref 0–53)
AST: 19 U/L (ref 0–37)
Alkaline Phosphatase: 93 U/L (ref 39–117)
CO2: 26 mEq/L (ref 19–32)
Chloride: 103 mEq/L (ref 96–112)
GFR calc non Af Amer: >60 mL/min (ref >60)
Sodium: 140 mEq/L (ref 135–145)
Total Bilirubin: 0.3 mg/dL (ref 0.3–1.2)

## 2011-06-16 LAB — URINALYSIS, ROUTINE W REFLEX MICROSCOPIC
Glucose, UA: NEGATIVE mg/dL
Ketones, ur: NEGATIVE mg/dL
Leukocytes, UA: NEGATIVE
Protein, ur: 30 mg/dL — AB
Urobilinogen, UA: 1 mg/dL (ref 0.0–1.0)

## 2011-06-16 LAB — DIFFERENTIAL
Basophils Relative: 0 % (ref 0–1)
Eosinophils Absolute: 0.1 10*3/uL (ref 0.0–0.7)
Lymphs Abs: 1.1 10*3/uL (ref 0.7–4.0)
Neutro Abs: 12.4 10*3/uL — ABNORMAL HIGH (ref 1.7–7.7)
Neutrophils Relative %: 83 % — ABNORMAL HIGH (ref 43–77)

## 2011-07-11 ENCOUNTER — Other Ambulatory Visit: Payer: Self-pay | Admitting: Internal Medicine

## 2011-07-11 DIAGNOSIS — M48061 Spinal stenosis, lumbar region without neurogenic claudication: Secondary | ICD-10-CM

## 2011-07-11 DIAGNOSIS — E669 Obesity, unspecified: Secondary | ICD-10-CM | POA: Insufficient documentation

## 2011-07-12 ENCOUNTER — Ambulatory Visit
Admission: RE | Admit: 2011-07-12 | Discharge: 2011-07-12 | Disposition: A | Discharge status: Home / Self Care | Payer: BC Managed Care – PPO | Source: Ambulatory Visit | Attending: Internal Medicine | Admitting: Internal Medicine

## 2011-07-12 DIAGNOSIS — M48061 Spinal stenosis, lumbar region without neurogenic claudication: Secondary | ICD-10-CM

## 2011-07-12 MED ORDER — GADOBENATE DIMEGLUMINE 529 MG/ML IV SOLN
20.0000 mL | Freq: Once | INTRAVENOUS | Status: AC | PRN
  Administered 2011-07-12: 20 mL via INTRAVENOUS

## 2011-07-18 ENCOUNTER — Encounter (INDEPENDENT_AMBULATORY_CARE_PROVIDER_SITE_OTHER): Payer: BC Managed Care – PPO

## 2011-07-18 DIAGNOSIS — I714 Abdominal aortic aneurysm, without rupture: Secondary | ICD-10-CM

## 2011-07-27 NOTE — Procedures (Unsigned)
DUPLEX ULTRASOUND OF ABDOMINAL AORTA  INDICATION:  Followup abdominal aortic aneurysm.  HISTORY: Diabetes:  No. Cardiac:  No. Hypertension:  Yes. Smoking:  Currently. Family History:  No. Previous Surgery:  Bilateral iliac stents 1994.  DUPLEX EXAM:         AP (cm)                   TRANSVERSE (cm) Proximal             2.50 cm                   2.55 cm Mid                  N/V                       N/V Distal               3.13 cm                   3.0 cm Right Iliac          1.41 cm                   1.68 cm Left Iliac           2.56 cm                   2.51 cm   IMPRESSION: 1. Distal abdominal aortic aneurysm with intramural thrombus present     measuring 3.13 cm x 3.0 cm. 2. 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 3. Technically difficult and limited evaluation of the abdominal aorta     and proximal iliac arteries due to excessive bowel gas and patient     body habitus.  ___________________________________________ Nelda Severe. Kellie Simmering, M.D.  SH/MEDQ  D:  07/18/2011  T:  07/18/2011  Job:  [id number]

## 2011-08-08 ENCOUNTER — Encounter (INDEPENDENT_AMBULATORY_CARE_PROVIDER_SITE_OTHER): Payer: BC Managed Care – PPO

## 2011-08-08 ENCOUNTER — Ambulatory Visit (INDEPENDENT_AMBULATORY_CARE_PROVIDER_SITE_OTHER): Payer: BC Managed Care – PPO | Admitting: Vascular Surgery

## 2011-08-08 ENCOUNTER — Encounter: Payer: Self-pay | Admitting: Vascular Surgery

## 2011-08-08 VITALS — BP 153/67 | HR 83 | Resp 20 | Ht 65.5 in | Wt 210.0 lb

## 2011-08-08 DIAGNOSIS — I789 Disease of capillaries, unspecified: Secondary | ICD-10-CM

## 2011-08-08 DIAGNOSIS — I70219 Atherosclerosis of native arteries of extremities with intermittent claudication, unspecified extremity: Secondary | ICD-10-CM

## 2011-08-08 DIAGNOSIS — Z48812 Encounter for surgical aftercare following surgery on the circulatory system: Secondary | ICD-10-CM

## 2011-08-08 NOTE — Progress Notes (Signed)
Subjective:     Patient ID: Dean [name], male   DOB: 07-25-1946, 65 y.o.   MRN: [id number]  HPI this 65 year old male patient was referred for possible claudication left leg. He has a history of bilateral iliac stents placed in 1994. We have followed him in our vascular lab and in October of 2011 his ABI was greater than 0.9 bilaterally he now has symptoms in his left leg after walking only about 3 minutes. This started in his thigh area extends distally. He also has numbness in both lower extremities and some discomfort which he has had related to back problems. He has had previous lumbar surgery. He now has spinal stenosis. He was seen by Dr.Elsner about one year ago who did not think further surgery would be beneficial. New  Past Medical History  Diagnosis Date  . Hyperlipidemia   . Hypertension   . Sleep apnea   . Kidney stone   . CAD (coronary artery disease)     s/p angioplasty and stenting x3 1994    History  Substance Use Topics  . Smoking status: Current Everyday Smoker -- 2.0 packs/day    Types: Cigarettes  . Smokeless tobacco: Not on file  . Alcohol Use: No    Family History  Problem Relation Age of Onset  . Other Father     ETOH  . Kidney cancer Mother     Allergies  Allergen Reactions  . Penicillins     REACTION: rapid pulse    Current outpatient prescriptions:albuterol (PROVENTIL HFA) 108 (90 BASE) MCG/ACT inhaler, Inhale 2 puffs into the lungs 2 (two) times daily as needed.  , Disp: , Rfl: ;  aspirin 325 MG buffered tablet, Take 325 mg by mouth daily.  , Disp: , Rfl: ;  atorvastatin (LIPITOR) 10 MG tablet, Take 10 mg by mouth daily.  , Disp: , Rfl: ;  dexlansoprazole (DEXILANT) 60 MG capsule, Take 60 mg by mouth daily.  , Disp: , Rfl:  diltiazem (CARDIZEM CD) 240 MG 24 hr capsule, Take 240 mg by mouth daily.  , Disp: , Rfl: ;  finasteride (PROSCAR) 5 MG tablet, Take 5 mg by mouth daily.  , Disp: , Rfl: ;  fluticasone (FLONASE) 50 MCG/ACT nasal spray, 1-2  sprays by Nasal route daily.  , Disp: , Rfl: ;  isosorbide mononitrate (IMDUR) 60 MG 24 hr tablet, Take 60 mg by mouth daily.  , Disp: , Rfl:  lisinopril (PRINIVIL,ZESTRIL) 20 MG tablet, Take 20 mg by mouth daily.  , Disp: , Rfl: ;  nicotine (NICODERM CQ - DOSED IN MG/24 HOURS) 21 mg/24hr patch, as directed.  , Disp: , Rfl: ;  nitroGLYCERIN (NITRODUR - DOSED IN MG/24 HR) 0.4 mg/hr, Place 1 patch onto the skin as directed.  , Disp: , Rfl: ;  Olopatadine HCl (PATANASE) 0.6 % SOLN, 1-2 puffs by Nasal route 2 (two) times daily as needed., Disp: 1 Bottle, Rfl: prn pentoxifylline (TRENTAL) 400 MG CR tablet, Take 400 mg by mouth daily.  , Disp: , Rfl:   Filed Vitals:   08/08/11 1106  Height: 5' 5.5" (1.664 m)  Weight: 210 lb (95.255 kg)    Body mass index is 34.41 kg/(m^2).         Review of Systems  Constitutional: Negative for fever, chills and appetite change.  HENT: Negative for hearing loss, nosebleeds, congestion, sore throat, trouble swallowing and voice change.   Eyes: Negative for photophobia and visual disturbance.  Respiratory: Negative for cough, chest tightness,  shortness of breath and wheezing.   Cardiovascular: Negative for chest pain, palpitations and leg swelling.  Gastrointestinal: Negative for nausea, vomiting, abdominal pain, diarrhea, constipation, blood in stool and abdominal distention.  Genitourinary: Negative for urgency, frequency, hematuria and difficulty urinating.  Musculoskeletal: Negative for back pain, joint swelling, arthralgias and gait problem.  Skin: Negative for pallor, rash and wound.  Neurological: Negative for dizziness, syncope, facial asymmetry, speech difficulty, weakness, light-headedness, numbness and headaches.  Hematological: Negative for adenopathy.  Psychiatric/Behavioral: Negative for confusion.       Objective:   Physical Exam  Constitutional: He is oriented to person, place, and time. He appears well-developed and well-nourished. No  distress.  HENT:  Head: Normocephalic.  Mouth/Throat: Oropharynx is clear and moist.  Eyes: EOM are normal.  Neck: Normal range of motion. Neck supple.  Cardiovascular: Normal rate and regular rhythm.   No murmur heard.      Right leg has 3+ femoral popliteal and dorsalis pedis pulse palpable. Left leg has absent femoral popliteal and distal pulses. Both feet are adequately perfused with no evidence of cellulitis or gangrene. No varicosities are noted.  Pulmonary/Chest: Effort normal and breath sounds normal. No respiratory distress. He has no wheezes. He has no rales.  Abdominal: Soft. He exhibits no distension and no mass. There is no tenderness. There is no rebound and no guarding.  Musculoskeletal: Normal range of motion. He exhibits no edema.  Lymphadenopathy:    He has no cervical adenopathy.  Neurological: He is alert and oriented to person, place, and time. Coordination normal.  Skin: Skin is warm and dry. No rash noted.  Psychiatric: His behavior is normal.   Blood pressure 153/67 heart rate 83 respirations 20    Assessment:     today I ordered lower extremity arterial ABIs which I reviewed and interpreted. Right leg is 1.0 left leg is 0.40. 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 He also has symptoms secondary to his lumbar spine problems. Some of the symptoms may overlap.    Plan:    aortobifemoral angiography with possible PTA and stenting of left iliac artery by Dr. Trula Slade on Tuesday of August 28.

## 2011-08-16 ENCOUNTER — Ambulatory Visit (HOSPITAL_COMMUNITY)
Admission: RE | Admit: 2011-08-16 | Discharge: 2011-08-16 | Disposition: A | Discharge status: Home / Self Care | Payer: BC Managed Care – PPO | Source: Ambulatory Visit | Attending: Surgery | Admitting: Surgery

## 2011-08-16 DIAGNOSIS — I70219 Atherosclerosis of native arteries of extremities with intermittent claudication, unspecified extremity: Secondary | ICD-10-CM

## 2011-08-16 LAB — POCT I-STAT, CHEM 8
Creatinine, Ser: 1.2 mg/dL (ref 0.50–1.35)
Glucose, Bld: 134 mg/dL — ABNORMAL HIGH (ref 70–99)
Hemoglobin: 17.3 g/dL — ABNORMAL HIGH (ref 13.0–17.0)
Potassium: 3.6 mEq/L (ref 3.5–5.1)
TCO2: 29 mmol/L (ref 0–100)

## 2011-08-18 NOTE — Op Note (Signed)
NAME:  Dean [name], Dean [name]             ACCOUNT NO.:  [id number]  MEDICAL RECORD NO.:  [id number]  LOCATION:  SDSC                         FACILITY:  [facility]  PHYSICIAN:  Theotis Burrow IV, MDDATE OF BIRTH:  11-May-1946  DATE OF PROCEDURE:  08/16/2011 DATE OF DISCHARGE:                              OPERATIVE REPORT   PREOPERATIVE DIAGNOSIS:  Left leg claudication.  POSTOPERATIVE DIAGNOSIS:  Left leg claudication.  PROCEDURE PERFORMED: 1. Ultrasound access, right femoral artery. 2. Abdominal aortogram. 3. Bilateral lower extremity runoff.  INDICATIONS:  This is a 65 year old gentleman with history of left common iliac stent in the 90s.  He has had worsening left leg symptoms. ABIs dropped to 0.4.  He comes in today for further evaluation.  PROCEDURE:  The patient was identified in the holding area, taken to room 8, placed supine on the table.  The right groin was prepped and draped in usual fashion.  Time-out was called.  The right femoral artery was evaluated by ultrasound and found to be widely patent.  A digital ultrasound image was acquired.  The right femoral artery was then accessed under ultrasound guidance with a micropuncture needle and 0.018 wire was advanced without resistance and a micropuncture sheath was placed.  Next over a Bentson wire, a 5-French sheath was inserted and a Omni flush catheter was advanced to the level of L1.  Abdominal aortogram was obtained.  The cath was pulled down to the aortic bifurcation.  Pelvic angiogram was performed followed by bilateral lower extremity runoff with a catheter in the distal abdominal aorta.  FINDINGS:  Aortogram:  Visualized portions of the suprarenal abdominal aorta showed no significant disease.  There were 2 right renal arteries which were widely patent.  The left renal artery was widely patent. Mild disease within the infrarenal abdominal aorta without evidence of stenosis.  Pelvic angiogram:  The left common iliac  artery and stents were occluded.  There was reconstitution of the common femoral artery from hypogastric collaterals.  The left external iliac artery also appears to be occluded.  There was luminal irregularity within the right common iliac artery.  The right common iliac artery stent was widely patent. The right external iliac artery was widely patent.  A pressure gradient was checked from the aorta to the right groin.  There was no significant gradient.  Right lower extremity:  The right common femoral artery was widely patent.  The right profunda femoral artery was widely patent.  The right superficial femoral artery was widely patent.  No significant tibial disease was appreciated in the limited evaluation below the knee.  Left lower extremity:  There was reconstitution of the left common femoral artery.  The profunda femoral artery was patent.  Superficial femoral artery was patent and the popliteal artery was patent.  Further evaluation of the left leg could not be done due to proximal disease and the inability to visualize contrast.  At this point, decision was made to terminate procedure.  Catheters and wires were removed.  The patient was taken to the holding area for sheath pull.  IMPRESSION: 1. Occluded left common iliac stent with reconstitution of the common     femoral artery.  No  significant outflow or runoff disease was     found, however, this was a limited evaluation due to proximal     disease and the inability to opacify the vessels with contrast. 2. Patent right common iliac stent.  Luminal irregularity within the     right common iliac system, however, there was no significant     pressure gradient. 3. No significant outflow or runoff disease on the right.     Eldridge Abrahams, MD     VWB/MEDQ  D:  08/16/2011  T:  08/16/2011  Job:  [id number]  Electronically Signed by Orvan Falconer IV MD on 08/18/2011 12:16:45 AM

## 2011-08-19 ENCOUNTER — Encounter: Payer: Self-pay | Admitting: Vascular Surgery

## 2011-08-23 ENCOUNTER — Ambulatory Visit (INDEPENDENT_AMBULATORY_CARE_PROVIDER_SITE_OTHER): Payer: BC Managed Care – PPO | Admitting: Vascular Surgery

## 2011-08-23 ENCOUNTER — Encounter: Payer: Self-pay | Admitting: Vascular Surgery

## 2011-08-23 VITALS — BP 134/67 | HR 59 | Resp 20 | Ht 65.5 in | Wt 214.6 lb

## 2011-08-23 DIAGNOSIS — I70219 Atherosclerosis of native arteries of extremities with intermittent claudication, unspecified extremity: Secondary | ICD-10-CM

## 2011-08-23 NOTE — Progress Notes (Signed)
Subjective:     Patient ID: Dean [name], male   DOB: [birth year], 65 y.o.   MRN: [id number]  HPI this 65 year old male patient returns for further evaluation of his left leg claudication symptoms. He underwent angiography by Dr. Trula Slade last week and I have reviewed her studies. 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 Currently he cannot even walk more than 3 minutes because of discomfort in the thigh and calf area as well as numbness in the foot. He also has chronic back problems. Currently is limited severely by his claudication. Past Medical History  Diagnosis Date  . Hyperlipidemia   . Hypertension   . Sleep apnea   . Kidney stone   . CAD (coronary artery disease)     s/p angioplasty and stenting x3 1994  . Peripheral vascular disease     History  Substance Use Topics  . Smoking status: Current Everyday Smoker -- 2.0 packs/day for 42 years    Types: Cigarettes  . Smokeless tobacco: Not on file  . Alcohol Use: 0.6 oz/week    1 Glasses of wine per week    Family History  Problem Relation Age of Onset  . Other Father     ETOH  . Kidney cancer Mother   . Cancer Mother     renal  . Cancer Sister     breast    Allergies  Allergen Reactions  . Penicillins     REACTION: rapid pulse    Current outpatient prescriptions:albuterol (PROVENTIL HFA) 108 (90 BASE) MCG/ACT inhaler, Inhale 2 puffs into the lungs 2 (two) times daily as needed.  , Disp: , Rfl: ;  aspirin 325 MG buffered tablet, Take 325 mg by mouth daily.  , Disp: , Rfl: ;  atorvastatin (LIPITOR) 10 MG tablet, Take 10 mg by mouth daily.  , Disp: , Rfl: ;  dexlansoprazole (DEXILANT) 60 MG capsule, Take 60 mg by mouth daily.  , Disp: , Rfl:  diltiazem (CARDIZEM CD) 240 MG 24 hr capsule, Take 240 mg by mouth daily.  , Disp: , Rfl: ;  finasteride (PROSCAR) 5 MG tablet, Take 5 mg by mouth daily.  , Disp: , Rfl: ;  fluticasone (FLONASE) 50 MCG/ACT nasal spray,  1-2 sprays by Nasal route daily.  , Disp: , Rfl: ;  isosorbide mononitrate (IMDUR) 60 MG 24 hr tablet, Take 60 mg by mouth daily.  , Disp: , Rfl:  lisinopril (PRINIVIL,ZESTRIL) 20 MG tablet, Take 20 mg by mouth daily.  , Disp: , Rfl: ;  nicotine (NICODERM CQ - DOSED IN MG/24 HOURS) 21 mg/24hr patch, as directed.  , Disp: , Rfl: ;  Olopatadine HCl (PATANASE) 0.6 % SOLN, 1-2 puffs by Nasal route 2 (two) times daily as needed., Disp: 1 Bottle, Rfl: prn;  pentoxifylline (TRENTAL) 400 MG CR tablet, Take 400 mg by mouth daily.  , Disp: , Rfl:  Diclofenac Sodium CR 100 MG 24 hr tablet, Take 100 mg by mouth daily.  , Disp: , Rfl: ;  nitroGLYCERIN (NITRODUR - DOSED IN MG/24 HR) 0.4 mg/hr, Place 1 patch onto the skin as directed.  , Disp: , Rfl:   BP 134/67  Pulse 59  Resp 20  Ht 5' 5.5" (1.664 m)  Wt 214 lb 9.6 oz (97.342 kg)  BMI 35.17 kg/m2  Body mass index is 35.17 kg/(m^2).        Review of Systems he denies chest pain dyspnea  on exertion PND and orthopnea. He does have significant back discomfort with ambulation as well. All other systems are negative     Objective:   Physical Exam Blood pressure 134/67 heart rate 59 respirations 20 General he is an obese male patient is in no apparent distress alert and oriented x3 HEENT exam normal for age Abdomen is obese no palpable masses Lower extremity exam is for gross femoral popliteal and dorsalis pedis pulse palpable in the right leg. Left leg has no palpable pulses. 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

## 2011-08-29 ENCOUNTER — Ambulatory Visit (HOSPITAL_COMMUNITY)
Admission: RE | Admit: 2011-08-29 | Discharge: 2011-08-29 | Disposition: A | Discharge status: Home / Self Care | Payer: BC Managed Care – PPO | Source: Ambulatory Visit | Attending: Vascular Surgery | Admitting: Vascular Surgery

## 2011-08-29 ENCOUNTER — Other Ambulatory Visit: Payer: Self-pay | Admitting: Vascular Surgery

## 2011-08-29 ENCOUNTER — Encounter (HOSPITAL_COMMUNITY)
Admission: RE | Admit: 2011-08-29 | Discharge: 2011-08-29 | Disposition: A | Discharge status: Home / Self Care | Payer: BC Managed Care – PPO | Source: Ambulatory Visit | Attending: Vascular Surgery | Admitting: Vascular Surgery

## 2011-08-29 DIAGNOSIS — Z01811 Encounter for preprocedural respiratory examination: Secondary | ICD-10-CM

## 2011-08-29 DIAGNOSIS — Z01812 Encounter for preprocedural laboratory examination: Secondary | ICD-10-CM | POA: Insufficient documentation

## 2011-08-29 DIAGNOSIS — Z01818 Encounter for other preprocedural examination: Secondary | ICD-10-CM | POA: Insufficient documentation

## 2011-08-29 DIAGNOSIS — J438 Other emphysema: Secondary | ICD-10-CM | POA: Insufficient documentation

## 2011-08-29 DIAGNOSIS — Z01810 Encounter for preprocedural cardiovascular examination: Secondary | ICD-10-CM | POA: Insufficient documentation

## 2011-08-29 LAB — ABO/RH: ABO/RH(D): A POS

## 2011-08-29 LAB — COMPREHENSIVE METABOLIC PANEL
ALT: 26 U/L (ref 0–53)
AST: 17 U/L (ref 0–37)
Alkaline Phosphatase: 103 U/L (ref 39–117)
CO2: 33 mEq/L — ABNORMAL HIGH (ref 19–32)
Calcium: 11 mg/dL — ABNORMAL HIGH (ref 8.4–10.5)
GFR calc Af Amer: >60 mL/min (ref >60)
GFR calc non Af Amer: 60 mL/min — ABNORMAL LOW (ref >60)
Glucose, Bld: 219 mg/dL — ABNORMAL HIGH (ref 70–99)
Potassium: 4.3 mEq/L (ref 3.5–5.1)
Sodium: 141 mEq/L (ref 135–145)

## 2011-08-29 LAB — CBC
HCT: 48.2 % (ref 39.0–52.0)
Hemoglobin: 17 g/dL (ref 13.0–17.0)
MCH: 32.8 pg (ref 26.0–34.0)
MCV: 92.9 fL (ref 78.0–100.0)
RBC: 5.19 MIL/uL (ref 4.22–5.81)
WBC: 9.8 10*3/uL (ref 4.0–10.5)

## 2011-08-29 LAB — APTT: aPTT: 28 seconds (ref 24–37)

## 2011-08-29 LAB — SURGICAL PCR SCREEN: Staphylococcus aureus: NEGATIVE

## 2011-08-31 ENCOUNTER — Inpatient Hospital Stay (HOSPITAL_COMMUNITY)
Admission: RE | Admit: 2011-08-31 | Discharge: 2011-09-03 | DRG: 796 | Disposition: A | Discharge status: Home / Self Care | Payer: BC Managed Care – PPO | Source: Ambulatory Visit | Attending: Vascular Surgery | Admitting: Vascular Surgery

## 2011-08-31 DIAGNOSIS — I70219 Atherosclerosis of native arteries of extremities with intermittent claudication, unspecified extremity: Principal | ICD-10-CM | POA: Diagnosis present

## 2011-08-31 DIAGNOSIS — K219 Gastro-esophageal reflux disease without esophagitis: Secondary | ICD-10-CM | POA: Diagnosis present

## 2011-08-31 DIAGNOSIS — J4489 Other specified chronic obstructive pulmonary disease: Secondary | ICD-10-CM | POA: Diagnosis present

## 2011-08-31 DIAGNOSIS — E785 Hyperlipidemia, unspecified: Secondary | ICD-10-CM | POA: Diagnosis present

## 2011-08-31 DIAGNOSIS — I10 Essential (primary) hypertension: Secondary | ICD-10-CM | POA: Diagnosis present

## 2011-08-31 DIAGNOSIS — F17200 Nicotine dependence, unspecified, uncomplicated: Secondary | ICD-10-CM | POA: Diagnosis present

## 2011-08-31 DIAGNOSIS — J449 Chronic obstructive pulmonary disease, unspecified: Secondary | ICD-10-CM | POA: Diagnosis present

## 2011-08-31 DIAGNOSIS — I2510 Atherosclerotic heart disease of native coronary artery without angina pectoris: Secondary | ICD-10-CM | POA: Diagnosis present

## 2011-08-31 HISTORY — PX: FEMORAL-FEMORAL BYPASS GRAFT: SHX936

## 2011-08-31 HISTORY — PX: PR VEIN BYPASS GRAFT,AORTO-FEM-POP: 35551

## 2011-09-01 ENCOUNTER — Inpatient Hospital Stay (HOSPITAL_COMMUNITY): Payer: BC Managed Care – PPO

## 2011-09-01 LAB — TYPE AND SCREEN: Unit division: 0

## 2011-09-01 LAB — BASIC METABOLIC PANEL
Chloride: 99 mEq/L (ref 96–112)
GFR calc Af Amer: >60 mL/min (ref >60)
GFR calc non Af Amer: 56 mL/min — ABNORMAL LOW (ref >60)
Potassium: 3.8 mEq/L (ref 3.5–5.1)
Sodium: 139 mEq/L (ref 135–145)

## 2011-09-01 LAB — CBC
HCT: 48.1 % (ref 39.0–52.0)
MCHC: 34.1 g/dL (ref 30.0–36.0)
Platelets: 196 10*3/uL (ref 150–400)
RDW: 13.5 % (ref 11.5–15.5)
WBC: 14.1 10*3/uL — ABNORMAL HIGH (ref 4.0–10.5)

## 2011-09-01 LAB — PRO B NATRIURETIC PEPTIDE: Pro B Natriuretic peptide (BNP): 78.9 pg/mL (ref 0–125)

## 2011-09-02 LAB — PRO B NATRIURETIC PEPTIDE: Pro B Natriuretic peptide (BNP): 50.5 pg/mL (ref 0–125)

## 2011-09-02 LAB — BASIC METABOLIC PANEL
CO2: 30 mEq/L (ref 19–32)
Chloride: 98 mEq/L (ref 96–112)
Creatinine, Ser: 1.6 mg/dL — ABNORMAL HIGH (ref 0.50–1.35)
GFR calc Af Amer: 53 mL/min — ABNORMAL LOW (ref >60)
Potassium: 3.1 mEq/L — ABNORMAL LOW (ref 3.5–5.1)
Sodium: 137 mEq/L (ref 135–145)

## 2011-09-02 LAB — URINE CULTURE: Colony Count: NO GROWTH

## 2011-09-02 LAB — CBC
Hemoglobin: 14.5 g/dL (ref 13.0–17.0)
MCH: 32 pg (ref 26.0–34.0)
Platelets: 175 10*3/uL (ref 150–400)
RBC: 4.53 MIL/uL (ref 4.22–5.81)
WBC: 13.3 10*3/uL — ABNORMAL HIGH (ref 4.0–10.5)

## 2011-09-03 LAB — BASIC METABOLIC PANEL
BUN: 16 mg/dL (ref 6–23)
Creatinine, Ser: 1.28 mg/dL (ref 0.50–1.35)
GFR calc non Af Amer: 57 mL/min — ABNORMAL LOW (ref >60)
Glucose, Bld: 118 mg/dL — ABNORMAL HIGH (ref 70–99)
Potassium: 3.8 mEq/L (ref 3.5–5.1)

## 2011-09-03 NOTE — Discharge Summary (Addendum)
  NAMEGUMESINDO, Dean [name] NO.:  [id number]  MEDICAL RECORD NO.:  [id number]  LOCATION:  2020                         FACILITY:  [facility]  PHYSICIAN:  Nelda Severe. Kellie Simmering, M.D.  DATE OF BIRTH:  1946-10-16  DATE OF ADMISSION:  08/31/2011 DATE OF DISCHARGE:  09/03/2011                              DISCHARGE SUMMARY   CHIEF COMPLAINT:  Severe claudication, left leg secondary to left iliac occlusion.  HISTORY OF PRESENT ILLNESS:  [name] is a 65 year old gentleman, who was seen by Dr. Kellie Simmering for left leg claudication symptoms.  He had an angiogram done by Dr. Trula Slade and he was found to have total occlusion of his left common iliac artery system where stents had been placed previously in 1994.  Currently, he cannot even walk more than 3 minutes because of discomfort in the thigh and calf area as well as the foot on the left.  He also has chronic back problems and currently he is severely limited by his claudication.  PAST MEDICAL HISTORY:  Significant for hypertension, sleep apnea, kidney stones, coronary artery disease, status post angioplasty and stenting in 1994, peripheral vascular disease status post left iliac stenting in 1994, also hyperlipidemia.  SOCIAL HISTORY:  He smokes two packs per day and has for 42 years.  HOSPITAL COURSE:  The patient was taken to the operating room on August 31, 2011 for a right-to-left fem-fem bypass with 8-mm Hemashield Dacron graft.  Postoperatively, the patient did very well, was voiding and taking p.o.  He was ambulating without difficulties. Wounds were healing well.  He had 2+ palpable DP pulses bilaterally and he was discharged home, be followed up by Dr. Kellie Simmering in 2-3 weeks.  DISCHARGE MEDICATIONS: 1. Oxycodone 1-2 tablets every 4 hours as needed for pain, #30 tablets     were given. 2. Artificial tears for his eyes. 3. Atorvastatin 10 mg daily at bedtime by mild flush as needed. 4. Dexilant one capsule every other  day 60 mg.  He rotates this with     his Nexium 40 mg every other day. 5. Diclofenac 100 mg daily. 6. Diltiazem CD/XT 240 mg every morning. 7. Aspirin 325 mg daily. 8. Finasteride 5 mg daily. 9. Isosorbide XR mononitrate 60 mg daily. 10.Lisinopril 20 mg daily. 11.Proventil two puffs twice daily. 12.He can stop his pentoxifylline 400 mg three times a day.  FINAL DIAGNOSIS:  Left iliac occlusion status post right-to-left fem-fem bypass.  All of the other medical issues were stable while in-house.  DISPOSITION:  The patient was discharged home.  He will be seen by Dr. Kellie Simmering in 2-3 weeks.     [name], PA-C   ______________________________ Nelda Severe Kellie Simmering, M.D.    RR/MEDQ  D:  09/03/2011  T:  09/03/2011  Job:  [id number]  Electronically Signed by Tinnie Gens M.D. on 09/12/2011 12:48:24 PM Electronically Signed by [name] PA on 09/13/2011 02:30:55 PM

## 2011-09-05 NOTE — Op Note (Signed)
  NAMEBEARL, BOISEN NO.:  [id number]  MEDICAL RECORD NO.:  [id number]  LOCATION:  [id number]                         FACILITY:  [facility]  PHYSICIAN:  Nelda Severe. Kellie Simmering, M.D.  DATE OF BIRTH:  August 30, 1946  DATE OF PROCEDURE:  08/31/2011 DATE OF DISCHARGE:                              OPERATIVE REPORT   PREOPERATIVE DIAGNOSIS:  Severe claudication, left leg, secondary to left iliac occlusion.  POSTOPERATIVE DIAGNOSIS:  Severe claudication, left leg, secondary to left iliac occlusion.  OPERATION:  A right to left femoral-femoral bypass using an 8-mm Hemashield Dacron graft.  SURGEON:  Nelda Severe. Kellie Simmering, M.D.  FIRST ASSISTANT:  [name], PA-C  ANESTHESIA:  General endotracheal.  PROCEDURE:  The patient was taken to the operating room, placed in the supine position at which time satisfactory general endotracheal anesthesia was administered.  Lower abdomen and both groin and thigh areas were prepped with Betadine scrub and solution draped in routine sterile manner.  Short longitudinal incisions were made bilaterally in the inguinal crease, carried down through subcutaneous tissue.  The common, superficial, and profunda femoris arteries dissected free, encircled with vessel loops.  Left side was pulseless.  The right side had about 3+ pulse, but was diffusely diseased with posterior plaque. Superficial femoral artery was patent bilaterally.  A superficial tunnel was created suprapubically superficial to the fascia and a 8-mm Hemashield graft delivered through the tunnel and the patient heparinized.  Femoral vessels on the right were occluded with vascular clamps, longitudinal opening made in the distal common femoral artery, opened with a 15-blade, extended with Potts scissors.  There was good inflow present.  The graft was slightly spatulated and anastomosed end- to-side with 5-0 Prolene.  Attention turned to the left side where an identical clamping was  performed with a longitudinal opening made in the distal common femoral, extended down at the origin of the superficial femoral on the left.  Graft spatulated and anastomosed end-to-side with 5-0 Prolene.  Clamps released.  There was an excellent pulse in the graft and good Doppler flow in the superficial femoral artery bilaterally.  The protamine was given to reverse the heparin.  Following adequate hemostasis, wound was closed in layers with Vicryl in a subcuticular fashion with Dermabond.  The patient taken to recovery room in stable condition.     Nelda Severe Kellie Simmering, M.D.    JDL/MEDQ  D:  08/31/2011  T:  08/31/2011  Job:  [id number]  Electronically Signed by Tinnie Gens M.D. on 09/05/2011 02:16:59 PM

## 2011-09-13 LAB — CBC
HCT: 46.9
Hemoglobin: 16.1
MCHC: 34.3
Platelets: 226
RDW: 14.7

## 2011-09-13 LAB — PROTIME-INR: INR: 0.9

## 2011-09-20 LAB — POCT I-STAT, CHEM 8
BUN: 10
Calcium, Ion: 1.09 — ABNORMAL LOW
Creatinine, Ser: 1.3
TCO2: 28

## 2011-09-20 LAB — URINALYSIS, ROUTINE W REFLEX MICROSCOPIC
Bilirubin Urine: NEGATIVE
Glucose, UA: NEGATIVE
Nitrite: NEGATIVE
Specific Gravity, Urine: 1.009
pH: 6.5

## 2011-09-20 LAB — CBC
HCT: 51.6
Hemoglobin: 17.2 — ABNORMAL HIGH
WBC: 9.5

## 2011-09-20 LAB — DIFFERENTIAL
Eosinophils Relative: 3
Lymphocytes Relative: 27
Lymphs Abs: 2.6
Monocytes Absolute: 0.8
Monocytes Relative: 8

## 2011-09-20 LAB — LIPASE, BLOOD: Lipase: 28

## 2011-09-20 LAB — B-NATRIURETIC PEPTIDE (CONVERTED LAB): Pro B Natriuretic peptide (BNP): <30

## 2011-09-22 ENCOUNTER — Other Ambulatory Visit: Payer: Self-pay | Admitting: Lab

## 2011-09-22 DIAGNOSIS — Z48812 Encounter for surgical aftercare following surgery on the circulatory system: Secondary | ICD-10-CM

## 2011-09-22 DIAGNOSIS — I70219 Atherosclerosis of native arteries of extremities with intermittent claudication, unspecified extremity: Secondary | ICD-10-CM

## 2011-09-22 LAB — DIFFERENTIAL
Basophils Absolute: 0 10*3/uL (ref 0.0–0.1)
Basophils Relative: 1 % (ref 0–1)
Eosinophils Absolute: 0.2 10*3/uL (ref 0.0–0.7)
Monocytes Relative: 11 % (ref 3–12)
Neutrophils Relative %: 57 % (ref 43–77)

## 2011-09-22 LAB — COMPREHENSIVE METABOLIC PANEL
ALT: 21 U/L (ref 0–53)
Alkaline Phosphatase: 66 U/L (ref 39–117)
BUN: 9 mg/dL (ref 6–23)
CO2: 29 mEq/L (ref 19–32)
Chloride: 105 mEq/L (ref 96–112)
GFR calc non Af Amer: >60 mL/min (ref >60)
Glucose, Bld: 91 mg/dL (ref 70–99)
Potassium: 4.5 mEq/L (ref 3.5–5.1)
Sodium: 140 mEq/L (ref 135–145)
Total Bilirubin: 0.6 mg/dL (ref 0.3–1.2)

## 2011-09-22 LAB — CBC
HCT: 50.4 % (ref 39.0–52.0)
Hemoglobin: 16.8 g/dL (ref 13.0–17.0)
RBC: 5.31 MIL/uL (ref 4.22–5.81)

## 2011-09-26 ENCOUNTER — Encounter: Payer: Self-pay | Admitting: Vascular Surgery

## 2011-09-27 ENCOUNTER — Ambulatory Visit (INDEPENDENT_AMBULATORY_CARE_PROVIDER_SITE_OTHER): Payer: BC Managed Care – PPO | Admitting: Vascular Surgery

## 2011-09-27 ENCOUNTER — Encounter: Payer: Self-pay | Admitting: Vascular Surgery

## 2011-09-27 VITALS — BP 157/69 | HR 64 | Temp 98.1°F | Resp 16 | Ht 66.0 in | Wt 211.0 lb

## 2011-09-27 DIAGNOSIS — I7092 Chronic total occlusion of artery of the extremities: Secondary | ICD-10-CM

## 2011-09-27 DIAGNOSIS — Z48812 Encounter for surgical aftercare following surgery on the circulatory system: Secondary | ICD-10-CM

## 2011-09-27 DIAGNOSIS — I739 Peripheral vascular disease, unspecified: Secondary | ICD-10-CM

## 2011-09-27 NOTE — Progress Notes (Signed)
Subjective:     Patient ID: Dean [name], male   DOB: 1946-06-14, 65 y.o.   MRN: [id number]  HPI this 65 year old male patient returns 4 weeks post right to left femoral-femoral bypass for severe claudication symptoms in the left leg secondary to left iliac occlusion he previously had left iliac stents which had occluded. He states that he is not having any left leg symptoms at this time. He continues to have back problems which are chronic in nature.  Review of Systems     Objective:   Physical Exam blood pressure 157/69 heart rate 64 respirations 16 Generally he is alert and oriented x3 in no apparent distress Lower extremity exam reveals a normal incision suture well-healed he has a 3+ femoral-femoral graft pulse. He has 2-3+ dorsalis pedis pulses palpable bilaterally.  Order lower extremity arterial ABIs. The left leg has improved significantly from 0.40-0.75 in the right leg is slightly diminished to 0.72 from 1.0. There is biphasic flow in both feet    Assessment:     nicely functioning femoral-femoral bypass   Plan:    return in 3 months with bilateral ABIs-lab only

## 2011-09-27 NOTE — Progress Notes (Signed)
Addended by: Mena Goes on: 09/27/2011 02:47 PM   Modules accepted: Orders

## 2011-09-28 ENCOUNTER — Ambulatory Visit (INDEPENDENT_AMBULATORY_CARE_PROVIDER_SITE_OTHER): Payer: BC Managed Care – PPO | Admitting: Internal Medicine

## 2011-09-28 ENCOUNTER — Encounter: Payer: Self-pay | Admitting: Internal Medicine

## 2011-09-28 VITALS — BP 142/76 | HR 77 | Ht 65.5 in | Wt 214.0 lb

## 2011-09-28 DIAGNOSIS — F17200 Nicotine dependence, unspecified, uncomplicated: Secondary | ICD-10-CM

## 2011-09-28 DIAGNOSIS — J449 Chronic obstructive pulmonary disease, unspecified: Secondary | ICD-10-CM

## 2011-09-28 DIAGNOSIS — Z23 Encounter for immunization: Secondary | ICD-10-CM

## 2011-09-28 DIAGNOSIS — J309 Allergic rhinitis, unspecified: Secondary | ICD-10-CM

## 2011-09-28 DIAGNOSIS — J4489 Other specified chronic obstructive pulmonary disease: Secondary | ICD-10-CM

## 2011-09-28 DIAGNOSIS — G4733 Obstructive sleep apnea (adult) (pediatric): Secondary | ICD-10-CM

## 2011-09-28 MED ORDER — PHENYLEPHRINE HCL 1 % NA SOLN
3.0000 [drp] | Freq: Once | NASAL | Status: AC
Start: 2011-09-28 — End: 2011-09-28
  Administered 2011-09-28: 3 [drp] via NASAL

## 2011-09-28 NOTE — Patient Instructions (Signed)
Flu vax  Neb neo nasal  Consider saline nasal rinse with Neti pot or squeeze bottle  Continue CPAP  I am really pleased with your good progress on smoking- let's keep it up.

## 2011-09-28 NOTE — Progress Notes (Signed)
Patient ID: Dean [name], male    DOB: Dec 11, 1946, 65 y.o.   MRN: [id number]  HPI 03/29/11- 65 yoM , ongoing smoker despite continued counseling efforts, followed here for COPD, obstructive sleep apnea, lung nodule, allergic rhinitis. Last here September 29, 2010, when PFT reviewed for 2010- FEV1/FVC 0.48 indicating severe COPD. Did well through the winter.  Now Spring pollen season is bothering him with itching eyes, watering eyes and nose.. Ears don't pop or hurt. Chest tightness and morning cough are also worse lately- blaming pollen. Denies discolored sputum, chest pain or palpitation. Actively uncomfortable with GERD- working with Dr Henrene Pastor.   09/28/11- 46 yoM , ongoing smoker despite continued counseling efforts, followed here for COPD, obstructive sleep apnea, lung nodule, allergic rhinitis. GERD is better. 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 Kellie Simmering. During that time he nearly quit cigarettes but admits still smoking about 4 per day. He has a written taper schedule plan for smoking cessation. I strongly encouraged him in this effort. He had no restart toric complications from his surgery. He is very much aware of shortness of breath. Still has daily cough with clear phlegm, no blood and no chest pain. He does complain of some sinus pressure with little nasal discharge. He continues able to use CPAP all night every night and now has a new mask which he likes. He feels this problem is controlled.  Review of Systems See HPI Constitutional:   No-   weight loss, night sweats, fevers, chills, fatigue, lassitude. HEENT:   No-  headaches, difficulty swallowing, tooth/dental problems, sore throat,       No-  sneezing, itching, ear ache, nasal congestion, post nasal drip,  CV:  No-   chest pain, orthopnea, PND, swelling in lower extremities, anasarca, dizziness, palpitations Resp: + shortness of breath with exertion or at rest.              +  productive cough,  No non-productive  cough,  No-  coughing up of blood.              No-   change in color of mucus.  No- wheezing.   Skin: No-   rash or lesions. GI:  No-   heartburn, indigestion, abdominal pain, nausea, vomiting, diarrhea,                 change in bowel habits, loss of appetite GU: No-   dysuria, change in color of urine, no urgency or frequency.  No- flank pain. MS:  No-   joint pain or swelling.  No- decreased range of motion.  No- back pain. Neuro- grossly normal to observation, Or:  Psych:  No- change in mood or affect. No depression or anxiety.  No memory loss.   Objective:   Physical Exam General- Alert, Oriented, Affect-appropriate, Distress- none acute. Odor of tobacco again noted. Skin- rash-none, lesions- none, excoriation- none Lymphadenopathy- none Head- atraumatic            Eyes- Gross vision intact, PERRLA, conjunctivae clear secretions            Ears- Hearing, canals-normal            Nose- Clear, no-Septal dev, mucus, polyps, erosion, perforation             Throat- Mallampati IV , mucosa red , drainage- none, tonsils- atrophic Neck- flexible , trachea midline, no stridor , thyroid nl, carotid no bruit Chest - symmetrical excursion ,  unlabored           Heart/CV- RRR , no murmur , no gallop  , no rub, nl s1 s2                           - JVD- none , edema- none, stasis changes- none, varices- none           Lung- diminished, wheeze- none, cough- none , dullness-none, rub- none           Chest wall-  Abd- tender-no, distended-no, bowel sounds-present, HSM- no Br/ Gen/ Rectal- Not done, not indicated Extrem- cyanosis- none, clubbing, none, atrophy- none, strength- nl Neuro- grossly intact to observation

## 2011-09-30 NOTE — Assessment & Plan Note (Signed)
Exertional dyspnea with relatively little cough or wheeze. Some of this has been do to his peripheral arterial disease.

## 2011-09-30 NOTE — Assessment & Plan Note (Signed)
Complaints of sinus pressure without a lot of sneezing or nasal discharge. Plan-nasal nebulizer treatment, Neti pot for saline lavage

## 2011-09-30 NOTE — Assessment & Plan Note (Signed)
He continues with very satisfactory compliance and control. Weight loss would help.

## 2011-09-30 NOTE — Assessment & Plan Note (Signed)
Smoking cessation efforts reviewed and reinforced. Hopefully he can sustain progress this time.

## 2011-11-02 DIAGNOSIS — B372 Candidiasis of skin and nail: Secondary | ICD-10-CM | POA: Insufficient documentation

## 2011-12-27 ENCOUNTER — Other Ambulatory Visit (INDEPENDENT_AMBULATORY_CARE_PROVIDER_SITE_OTHER): Payer: BC Managed Care – PPO | Admitting: *Deleted

## 2011-12-27 DIAGNOSIS — I7092 Chronic total occlusion of artery of the extremities: Secondary | ICD-10-CM

## 2011-12-27 DIAGNOSIS — I739 Peripheral vascular disease, unspecified: Secondary | ICD-10-CM

## 2011-12-27 DIAGNOSIS — Z48812 Encounter for surgical aftercare following surgery on the circulatory system: Secondary | ICD-10-CM

## 2012-01-03 ENCOUNTER — Encounter: Payer: Self-pay | Admitting: Vascular Surgery

## 2012-01-04 ENCOUNTER — Other Ambulatory Visit: Payer: Self-pay | Admitting: *Deleted

## 2012-01-04 DIAGNOSIS — I739 Peripheral vascular disease, unspecified: Secondary | ICD-10-CM

## 2012-01-04 DIAGNOSIS — Z48812 Encounter for surgical aftercare following surgery on the circulatory system: Secondary | ICD-10-CM

## 2012-03-28 ENCOUNTER — Encounter: Payer: Self-pay | Admitting: Internal Medicine

## 2012-03-28 ENCOUNTER — Ambulatory Visit (INDEPENDENT_AMBULATORY_CARE_PROVIDER_SITE_OTHER): Payer: Medicare Other | Admitting: Internal Medicine

## 2012-03-28 VITALS — BP 128/80 | HR 72 | Ht 65.5 in | Wt 220.2 lb

## 2012-03-28 DIAGNOSIS — G4733 Obstructive sleep apnea (adult) (pediatric): Secondary | ICD-10-CM

## 2012-03-28 DIAGNOSIS — J309 Allergic rhinitis, unspecified: Secondary | ICD-10-CM

## 2012-03-28 DIAGNOSIS — F17200 Nicotine dependence, unspecified, uncomplicated: Secondary | ICD-10-CM

## 2012-03-28 DIAGNOSIS — J449 Chronic obstructive pulmonary disease, unspecified: Secondary | ICD-10-CM

## 2012-03-28 MED ORDER — ALBUTEROL SULFATE (2.5 MG/3ML) 0.083% IN NEBU
2.5000 mg | INHALATION_SOLUTION | Freq: Once | RESPIRATORY_TRACT | Status: AC
  Administered 2012-03-28: 2.5 mg via RESPIRATORY_TRACT

## 2012-03-28 NOTE — Assessment & Plan Note (Signed)
Seasonal exacerbation, compounded by his smoking  Plan - trial of Dymista

## 2012-03-28 NOTE — Progress Notes (Signed)
Patient ID: Dean [name], male    DOB: [birth year], 66 y.o.   MRN: [id number]  HPI 03/29/11- 65 yoM , ongoing smoker despite continued counseling efforts, followed here for COPD, obstructive sleep apnea, lung nodule, allergic rhinitis. Last here September 29, 2010, when PFT reviewed for 2010- FEV1/FVC 0.48 indicating severe COPD. Did well through the winter.  Now Spring pollen season is bothering him with itching eyes, watering eyes and nose.. Ears don't pop or hurt. Chest tightness and morning cough are also worse lately- blaming pollen. Denies discolored sputum, chest pain or palpitation. Actively uncomfortable with GERD- working with Dr Henrene Pastor.   09/28/11- 53 yoM , ongoing smoker despite continued counseling efforts, followed here for COPD, obstructive sleep apnea, lung nodule, allergic rhinitis. GERD is better. 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 Kellie Simmering. During that time he nearly quit cigarettes but admits still smoking about 4 per day. He has a written taper schedule plan for smoking cessation. I strongly encouraged him in this effort. He had no restart toric complications from his surgery. He is very much aware of shortness of breath. Still has daily cough with clear phlegm, no blood and no chest pain. He does complain of some sinus pressure with little nasal discharge. He continues able to use CPAP all night every night and now has a new mask which he likes. He feels this problem is controlled.   03/28/12-  19 yoM , ongoing smoker despite continued counseling efforts, followed here for COPD, obstructive sleep apnea, lung nodule, allergic rhinitis. Activity is limited more by back pain and his breathing. He continues to smoke. Now pollen is bothering him with nasal congestion. Using nasal saline. Admits cough with white sputum. Denies blood, chest pain, palpitation or recent infection. IMPRESSION: CXR- 09/01/11- reviewed with pt Interval decreased interstitial prominence.  Mild  left lung base/retrocardiac opacity is likely scarring or  atelectasis. A mild/early infiltrate is not excluded in the  appropriate clinical setting.  Original Report Authenticated By: Suanne Marker, M.D.   Review of Systems-See HPI Constitutional:   No-   weight loss, night sweats, fevers, chills, fatigue, lassitude. HEENT:   No-  headaches, difficulty swallowing, tooth/dental problems, sore throat,       No-  sneezing, itching, ear ache, nasal congestion, post nasal drip,  CV:  No-   chest pain, orthopnea, PND, swelling in lower extremities, anasarca, dizziness, palpitations Resp: + shortness of breath with exertion or at rest.              +  productive cough,  No non-productive cough,  No-  coughing up of blood.              No-   change in color of mucus.  No- wheezing.   Skin: No-   rash or lesions. GI:  No-   heartburn, indigestion, abdominal pain, nausea, vomiting, diarrhea,                 change in bowel habits, loss of appetite GU:  MS:  No-   joint pain or swelling.  No- decreased range of motion.  + back pain. Neuro- nothing unusual Psych:  No- change in mood or affect. No depression or anxiety.  No memory loss.   Objective:   Physical Exam General- Alert, Oriented, Affect-appropriate, Distress- none acute. Odor of tobacco again noted.Overweight Skin- rash-none, lesions- none, excoriation- none Lymphadenopathy- none Head- atraumatic  Eyes- Gross vision intact, PERRLA, conjunctivae clear secretions            Ears- Hearing, canals-normal            Nose- mucus bridging, no-Septal dev, mucus, polyps, erosion, perforation             Throat- Mallampati IV , mucosa red , drainage- none, tonsils- atrophic Neck- flexible , trachea midline, no stridor , thyroid nl, carotid no bruit Chest - symmetrical excursion , unlabored           Heart/CV- RRR , no murmur , no gallop  , no rub, nl s1 s2                           - JVD- none , edema- none, stasis changes-  none, varices- none           Lung- diminished, wheeze- none,  + loose cough , dullness-none, rub- none           Chest wall-  Abd-  Br/ Gen/ Rectal- Not done, not indicated Extrem- cyanosis- none, clubbing, none, atrophy- none, strength- nl Neuro- grossly intact to observation

## 2012-03-28 NOTE — Patient Instructions (Addendum)
The best thing you can do for yourself continues to be smoking cessation!!  Try sample Dymista nasal spray- combination of antihistamine and steroid.   1-2 puffs each nostril once or twice daily. Depends on cumulative use to work well.   Please call as needed    Neb- albuterol 0.083      See how this affects your breathing this afternoon.

## 2012-04-01 NOTE — Assessment & Plan Note (Signed)
We have reinforced smoking cessation recommendations and offers of support.

## 2012-04-01 NOTE — Assessment & Plan Note (Addendum)
Last PFT was in 2010. Will probably look to repeat it in the next year or so. Obesity and peripheral vascular disease contributing to exertional dyspnea.

## 2012-04-01 NOTE — Assessment & Plan Note (Signed)
Continues compliant with CPAP.

## 2012-05-03 ENCOUNTER — Telehealth: Payer: Self-pay | Admitting: Internal Medicine

## 2012-05-03 ENCOUNTER — Encounter: Payer: Self-pay | Admitting: Internal Medicine

## 2012-05-03 NOTE — Telephone Encounter (Signed)
Please clarify..patient was asking to come in to do the neb tx.  Thanks.

## 2012-05-03 NOTE — Telephone Encounter (Signed)
Per CY-okay to give albuterol nebulizer meds as requested.

## 2012-05-03 NOTE — Telephone Encounter (Signed)
Please advise regarding the neb tx, thanks

## 2012-05-03 NOTE — Telephone Encounter (Signed)
I spoke with pt and he states the dymista has worked well for him and would like an rx called in for him. He uses it 1-2 puffs QD. Also he stated he could not tell if the albuterol neb had helped with his breathing or not bc he had used his rescue inhaler about 1 hr prior to coming in for his OV. He is wanting to know if he can come in one AM to just do the neb tx before he uses his rescue inhaler to see if he can tell a difference. Please advise Dr. Annamaria Boots, thanks  [facility] [address]

## 2012-05-03 NOTE — Telephone Encounter (Signed)
Per CY-okay to give Dymista RX but if insurance will not cover we have to use steroid nasal and antihistamine nasal sprays.

## 2012-05-03 NOTE — Telephone Encounter (Signed)
Pt aware to be here at 11am 05-04-12 to see CY first then get neb tx.

## 2012-05-03 NOTE — Telephone Encounter (Signed)
Per CY-okay to have patient come by for neb tx-pt will need to placed on schedule as acute ov in order to charge for this. Please have patient come in on Friday so CY can see him quickly and okay the neb tx once again. Thanks.

## 2012-05-04 ENCOUNTER — Ambulatory Visit (INDEPENDENT_AMBULATORY_CARE_PROVIDER_SITE_OTHER): Payer: Medicare Other | Admitting: Internal Medicine

## 2012-05-04 ENCOUNTER — Encounter: Payer: Self-pay | Admitting: Internal Medicine

## 2012-05-04 VITALS — BP 118/66 | HR 66 | Ht 65.5 in | Wt 216.0 lb

## 2012-05-04 DIAGNOSIS — J309 Allergic rhinitis, unspecified: Secondary | ICD-10-CM

## 2012-05-04 DIAGNOSIS — J449 Chronic obstructive pulmonary disease, unspecified: Secondary | ICD-10-CM

## 2012-05-04 MED ORDER — ALBUTEROL SULFATE (2.5 MG/3ML) 0.083% IN NEBU
2.5000 mg | INHALATION_SOLUTION | Freq: Once | RESPIRATORY_TRACT | Status: AC
  Administered 2012-05-04: 2.5 mg via RESPIRATORY_TRACT

## 2012-05-04 NOTE — Patient Instructions (Addendum)
Notice how you feel over the next few hours after the neb treatment, and let us know if it seems useful for you to have a nebulizer machine at home.

## 2012-05-04 NOTE — Progress Notes (Signed)
Patient ID: Dean [name], male    DOB: 18-Mar-1946, 66 y.o.   MRN: [id number]  HPI 03/29/11- 65 yoM , ongoing smoker despite continued counseling efforts, followed here for COPD, obstructive sleep apnea, lung nodule, allergic rhinitis. Last here September 29, 2010, when PFT reviewed for 2010- FEV1/FVC 0.48 indicating severe COPD. Did well through the winter.  Now Spring pollen season is bothering him with itching eyes, watering eyes and nose.. Ears don't pop or hurt. Chest tightness and morning cough are also worse lately- blaming pollen. Denies discolored sputum, chest pain or palpitation. Actively uncomfortable with GERD- working with Dr Henrene Pastor.   09/28/11- 40 yoM , ongoing smoker despite continued counseling efforts, followed here for COPD, obstructive sleep apnea, lung nodule, allergic rhinitis. GERD is better. 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 Kellie Simmering. During that time he nearly quit cigarettes but admits still smoking about 4 per day. He has a written taper schedule plan for smoking cessation. I strongly encouraged him in this effort. He had no restart toric complications from his surgery. He is very much aware of shortness of breath. Still has daily cough with clear phlegm, no blood and no chest pain. He does complain of some sinus pressure with little nasal discharge. He continues able to use CPAP all night every night and now has a new mask which he likes. He feels this problem is controlled.   03/28/12-  70 yoM , ongoing smoker despite continued counseling efforts, followed here for COPD, obstructive sleep apnea, lung nodule, allergic rhinitis. Activity is limited more by back pain and his breathing. He continues to smoke. Now pollen is bothering him with nasal congestion. Using nasal saline. Admits cough with white sputum. Denies blood, chest pain, palpitation or recent infection. IMPRESSION: CXR- 09/01/11- reviewed with pt Interval decreased interstitial prominence.  Mild  left lung base/retrocardiac opacity is likely scarring or  atelectasis. A mild/early infiltrate is not excluded in the  appropriate clinical setting.  Original Report Authenticated By: Suanne Marker, M.D.   05/04/12-  64 yoM , ongoing smoker despite continued counseling efforts, followed here for COPD, obstructive sleep apnea, lung nodule, allergic rhinitis. He had called wanting to try a neb treatment before deciding if a home neb would work better than his MDI. He continues to smoke against advice.  Review of Systems-See HPI Constitutional:   No-   weight loss, night sweats, fevers, chills, fatigue, lassitude. HEENT:   No-  headaches, difficulty swallowing, tooth/dental problems, sore throat,       No-  sneezing, itching, ear ache, nasal congestion, post nasal drip,  CV:  No-   chest pain, orthopnea, PND, swelling in lower extremities, anasarca, dizziness, palpitations Resp: + shortness of breath with exertion or at rest.              +  productive cough,  No non-productive cough,  No-  coughing up of blood.              No-   change in color of mucus.  No- wheezing.    Objective:   Physical Exam General- Alert, Oriented, Affect-appropriate, Distress- none acute. Strong odor of tobacco again noted.Overweight Skin- rash-none, lesions- none, excoriation- none Lymphadenopathy- none Neck- flexible , trachea midline, no stridor , thyroid nl, carotid no bruit Chest - symmetrical excursion , unlabored           Heart/CV- RRR , no murmur , no gallop  , no rub, nl  s1 s2                           - JVD- none , edema- none, stasis changes- none, varices- none           Lung- diminished, wheeze- none,  + loose cough , dullness-none, rub- none

## 2012-05-04 NOTE — Assessment & Plan Note (Signed)
Gradually worse. No acute event. Neb treatment for trial today.

## 2012-05-07 ENCOUNTER — Telehealth: Payer: Self-pay | Admitting: Internal Medicine

## 2012-05-07 DIAGNOSIS — J449 Chronic obstructive pulmonary disease, unspecified: Secondary | ICD-10-CM

## 2012-05-07 MED ORDER — ALBUTEROL SULFATE (2.5 MG/3ML) 0.083% IN NEBU: 2.5000 mg | INHALATION_SOLUTION | RESPIRATORY_TRACT | Status: DC | PRN

## 2012-05-07 NOTE — Telephone Encounter (Signed)
Ok order DME [facility]   Nebulizer compressor                 Albuterol 0.083 neb solution # 50, 1 every 4 hours if needed, refill prn, Dx COPD

## 2012-05-07 NOTE — Telephone Encounter (Signed)
Spoke with pt. He states calling to let CY know that the albuterol neb tx he had in the office helped a lot and so he wants rx sent to [facility] for neb sol and machine. Please advise, thanks!

## 2012-05-07 NOTE — Telephone Encounter (Signed)
Order sent to [facility] for neb machine and supplies. I have also faxed rx to them for albuterol solution.  Pt aware aware and states that there is nothing further needed.

## 2012-05-29 ENCOUNTER — Encounter: Payer: Self-pay | Admitting: Internal Medicine

## 2012-06-12 ENCOUNTER — Ambulatory Visit (AMBULATORY_SURGERY_CENTER): Payer: Medicare Other | Admitting: *Deleted

## 2012-06-12 VITALS — Ht 65.5 in | Wt 214.0 lb

## 2012-06-12 DIAGNOSIS — Z1211 Encounter for screening for malignant neoplasm of colon: Secondary | ICD-10-CM

## 2012-06-12 MED ORDER — MOVIPREP 100 G PO SOLR: ORAL | Status: DC

## 2012-06-26 ENCOUNTER — Ambulatory Visit (AMBULATORY_SURGERY_CENTER): Payer: Medicare Other | Admitting: Internal Medicine

## 2012-06-26 ENCOUNTER — Encounter: Payer: Self-pay | Admitting: Internal Medicine

## 2012-06-26 VITALS — BP 146/88 | HR 71 | Temp 97.2°F | Resp 17 | Ht 65.5 in | Wt 214.0 lb

## 2012-06-26 DIAGNOSIS — D126 Benign neoplasm of colon, unspecified: Secondary | ICD-10-CM

## 2012-06-26 DIAGNOSIS — Z1211 Encounter for screening for malignant neoplasm of colon: Secondary | ICD-10-CM

## 2012-06-26 DIAGNOSIS — Z86010 Personal history of colonic polyps: Secondary | ICD-10-CM

## 2012-06-26 MED ORDER — SODIUM CHLORIDE 0.9 % IV SOLN: 500.0000 mL | INTRAVENOUS | Status: DC

## 2012-06-26 NOTE — Patient Instructions (Addendum)
Discharge instructions given with verbal understanding. Handout on polyps given. Resume previous medications. YOU HAD AN ENDOSCOPIC PROCEDURE TODAY AT THE [facility]: Refer to the procedure report that was given to you for any specific questions about what was found during the examination.  If the procedure report does not answer your questions, please call your gastroenterologist to clarify.  If you requested that your care partner not be given the details of your procedure findings, then the procedure report has been included in a sealed envelope for you to review at your convenience later.  YOU SHOULD EXPECT: Some feelings of bloating in the abdomen. Passage of more gas than usual.  Walking can help get rid of the air that was put into your GI tract during the procedure and reduce the bloating. If you had a lower endoscopy (such as a colonoscopy or flexible sigmoidoscopy) you may notice spotting of blood in your stool or on the toilet paper. If you underwent a bowel prep for your procedure, then you may not have a normal bowel movement for a few days.  DIET: Your first meal following the procedure should be a light meal and then it is ok to progress to your normal diet.  A half-sandwich or bowl of soup is an example of a good first meal.  Heavy or fried foods are harder to digest and may make you feel nauseous or bloated.  Likewise meals heavy in dairy and vegetables can cause extra gas to form and this can also increase the bloating.  Drink plenty of fluids but you should avoid alcoholic beverages for 24 hours.  ACTIVITY: Your care partner should take you home directly after the procedure.  You should plan to take it easy, moving slowly for the rest of the day.  You can resume normal activity the day after the procedure however you should NOT DRIVE or use heavy machinery for 24 hours (because of the sedation medicines used during the test).    SYMPTOMS TO REPORT IMMEDIATELY: A  gastroenterologist can be reached at any hour.  During normal business hours, 8:30 AM to 5:00 PM Monday through Friday, call [phone].  After hours and on weekends, please call the GI answering service at [phone] who will take a message and have the physician on call contact you.   Following lower endoscopy (colonoscopy or flexible sigmoidoscopy):  Excessive amounts of blood in the stool  Significant tenderness or worsening of abdominal pains  Swelling of the abdomen that is new, acute  Fever of 100F or higher  FOLLOW UP: If any biopsies were taken you will be contacted by phone or by letter within the next 1-3 weeks.  Call your gastroenterologist if you have not heard about the biopsies in 3 weeks.  Our staff will call the home number listed on your records the next business day following your procedure to check on you and address any questions or concerns that you may have at that time regarding the information given to you following your procedure. This is a courtesy call and so if there is no answer at the home number and we have not heard from you through the emergency physician on call, we will assume that you have returned to your regular daily activities without incident.  SIGNATURES/CONFIDENTIALITY: You and/or your care partner have signed paperwork which will be entered into your electronic medical record.  These signatures attest to the fact that that the information above on your After Visit Summary has   been reviewed and is understood.  Full responsibility of the confidentiality of this discharge information lies with you and/or your care-partner. 

## 2012-06-26 NOTE — Progress Notes (Signed)
Patient did not experience any of the following events: a burn prior to discharge; a fall within the facility; wrong site/side/patient/procedure/implant event; or a hospital transfer or hospital admission upon discharge from the facility. (G8907) Patient did not have preoperative order for IV antibiotic SSI prophylaxis. (G8918)  

## 2012-06-26 NOTE — Op Note (Signed)
[facility] [address] [place], [place]  [id number]  COLONOSCOPY PROCEDURE REPORT  PATIENT:  Dean [name], Dean [name]  MR#:  [id number] BIRTHDATE:  Oct 20, 1946, 66 yrs. old  GENDER:  male ENDOSCOPIST:  Docia Chuck. Geri Seminole, MD REF. BY:  Surveillance Program Recall, PROCEDURE DATE:  06/26/2012 PROCEDURE:  Colonoscopy with snare polypectomy x 2 ASA CLASS:  Class II INDICATIONS:  history of pre-cancerous (adenomatous) colon polyps, surveillance and high-risk screening ; prior exams 2003 (large hpp); 2006 w/ TA MEDICATIONS:   MAC sedation, administered by CRNA, propofol (Diprivan) 150 mg IV  DESCRIPTION OF PROCEDURE:   After the risks benefits and alternatives of the procedure were thoroughly explained, informed consent was obtained.  Digital rectal exam was performed and revealed no abnormalities.   The LB CF-H180AL [id number] endoscope was introduced through the anus and advanced to the cecum, which was identified by both the appendix and ileocecal valve, without limitations.  The quality of the prep was good, using MoviPrep. The instrument was then slowly withdrawn as the colon was fully examined. <<PROCEDUREIMAGES>>  FINDINGS:  Two polyps were found - 92mm in the ascending colon and 75mm in descending. Polyps were snared without cautery. Retrieval was successful.   Severe diverticulosis was found in the left colon.   Retroflexed views in the rectum revealed internal hemorrhoids.    The time to cecum =  3:53  minutes. The scope was then withdrawn in   14:57  minutes from the cecum and the procedure completed.  COMPLICATIONS:  None  ENDOSCOPIC IMPRESSION: 1) Two polyps in the  colon - removed 2) Severe diverticulosis in the left colon 3) Internal hemorrhoids  RECOMMENDATIONS: 1) Follow up colonoscopy in 5 years  ______________________________ Docia Chuck. Geri Seminole, MD  CC:  [name], MD; The Patient  n. eSIGNED:   Docia Chuck. Geri Seminole at 06/26/2012 09:15 AM  [name], [id number]

## 2012-06-27 ENCOUNTER — Telehealth: Payer: Self-pay | Admitting: *Deleted

## 2012-06-27 NOTE — Telephone Encounter (Signed)
No answer, message left at number provided.

## 2012-07-02 ENCOUNTER — Encounter: Payer: Self-pay | Admitting: Internal Medicine

## 2012-09-27 ENCOUNTER — Encounter: Payer: Self-pay | Admitting: Internal Medicine

## 2012-09-27 ENCOUNTER — Ambulatory Visit (INDEPENDENT_AMBULATORY_CARE_PROVIDER_SITE_OTHER)
Admission: RE | Admit: 2012-09-27 | Discharge: 2012-09-27 | Disposition: A | Discharge status: Home / Self Care | Payer: Medicare Other | Source: Ambulatory Visit | Attending: Internal Medicine | Admitting: Internal Medicine

## 2012-09-27 ENCOUNTER — Ambulatory Visit (INDEPENDENT_AMBULATORY_CARE_PROVIDER_SITE_OTHER): Payer: Medicare Other | Admitting: Internal Medicine

## 2012-09-27 VITALS — BP 146/72 | HR 66 | Ht 65.5 in | Wt 208.2 lb

## 2012-09-27 DIAGNOSIS — J449 Chronic obstructive pulmonary disease, unspecified: Secondary | ICD-10-CM

## 2012-09-27 DIAGNOSIS — G4733 Obstructive sleep apnea (adult) (pediatric): Secondary | ICD-10-CM

## 2012-09-27 DIAGNOSIS — F17200 Nicotine dependence, unspecified, uncomplicated: Secondary | ICD-10-CM

## 2012-09-27 MED ORDER — FLUTICASONE PROPIONATE 50 MCG/ACT NA SUSP: 1.0000 | Freq: Every day | NASAL | Status: DC

## 2012-09-27 NOTE — Progress Notes (Signed)
Patient ID: LINC FLAMENCO, male    DOB: [birth year], 66 y.o.   MRN: [id number]  HPI 03/29/11- 65 yoM , ongoing smoker despite continued counseling efforts, followed here for COPD, obstructive sleep apnea, lung nodule, allergic rhinitis. Last here September 29, 2010, when PFT reviewed for 2010- FEV1/FVC 0.48 indicating severe COPD. Did well through the winter.  Now Spring pollen season is bothering him with itching eyes, watering eyes and nose.. Ears don't pop or hurt. Chest tightness and morning cough are also worse lately- blaming pollen. Denies discolored sputum, chest pain or palpitation. Actively uncomfortable with GERD- working with Dr Henrene Pastor.   09/28/11- 57 yoM , ongoing smoker despite continued counseling efforts, followed here for COPD, obstructive sleep apnea, lung nodule, allergic rhinitis. GERD is better. 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 Kellie Simmering. During that time he nearly quit cigarettes but admits still smoking about 4 per day. He has a written taper schedule plan for smoking cessation. I strongly encouraged him in this effort. He had no restart toric complications from his surgery. He is very much aware of shortness of breath. Still has daily cough with clear phlegm, no blood and no chest pain. He does complain of some sinus pressure with little nasal discharge. He continues able to use CPAP all night every night and now has a new mask which he likes. He feels this problem is controlled.   03/28/12-  40 yoM , ongoing smoker despite continued counseling efforts, followed here for COPD, obstructive sleep apnea, lung nodule, allergic rhinitis. Activity is limited more by back pain and his breathing. He continues to smoke. Now pollen is bothering him with nasal congestion. Using nasal saline. Admits cough with white sputum. Denies blood, chest pain, palpitation or recent infection. IMPRESSION: CXR- 09/01/11- reviewed with pt Interval decreased interstitial prominence.  Mild  left lung base/retrocardiac opacity is likely scarring or  atelectasis. A mild/early infiltrate is not excluded in the  appropriate clinical setting.  Original Report Authenticated By: Suanne Marker, M.D.   05/04/12-  35 yoM , ongoing smoker despite continued counseling efforts, followed here for COPD, obstructive sleep apnea, lung nodule, allergic rhinitis. He had called wanting to try a neb treatment before deciding if a home neb would work better than his MDI. He continues to smoke against advice.  09/27/12- 33 yoM , ongoing smoker despite continued counseling efforts, followed here for COPD, obstructive sleep apnea, lung nodule, allergic rhinitis. CPAP [facility] 17-  good  Still has SOB at times; breathing has gotten better since last visit. No acute events and less cough and wheeze as he has decreased smoking. Chronic back pain limits activity. COPD assessment test (CABG) score 12/40 CXR 09/01/11- reviewed with him IMPRESSION:  Interval decreased interstitial prominence.  Mild left lung base/retrocardiac opacity is likely scarring or  atelectasis. A mild/early infiltrate is not excluded in the  appropriate clinical setting.  Original Report Authenticated By: Suanne Marker, M.D.   ROS-see HPI Constitutional:   No-   weight loss, night sweats, fevers, chills, fatigue, lassitude. HEENT:   No-  headaches, difficulty swallowing, tooth/dental problems, sore throat,       No-  sneezing, itching, ear ache, nasal congestion, post nasal drip,  CV:  No-   chest pain, orthopnea, PND, swelling in lower extremities, anasarca,  dizziness, palpitations Resp: + shortness of breath with exertion or at rest.              No-  productive cough,  + non-productive cough,  No- coughing up of blood.              No-   change in color of mucus.  + wheezing.   Skin: No-   rash or lesions. GI:  No-   heartburn, indigestion, abdominal pain, nausea, vomiting,  GU:  MS:  No-   joint pain or swelling.   . Neuro-     nothing unusual Psych:  No- change in mood or affect. No depression or anxiety.  No memory loss.  Objective:  OBJ- Physical Exam General- Alert, Oriented, Affect-appropriate, Distress- none acute. Odor of tobacco Skin- rash-none, lesions- none, excoriation- none Lymphadenopathy- none Head- atraumatic            Eyes- Gross vision intact, PERRLA, conjunctivae and secretions clear            Ears- Hearing, canals-normal            Nose- Clear, no-Septal dev, mucus, polyps, erosion, perforation             Throat- Mallampati II , mucosa clear , drainage- none, tonsils- atrophic Neck- flexible , trachea midline, no stridor , thyroid nl, carotid no bruit Chest - symmetrical excursion , unlabored           Heart/CV- RRR , no murmur , no gallop  , no rub, nl s1 s2                           - JVD- none , edema- none, stasis changes- none, varices- none           Lung- + diminished but clear to P&A, wheeze- none, cough- none , dullness-none, rub- none           Chest wall-  Abd-  Br/ Gen/ Rectal- Not done, not indicated Extrem- cyanosis- none, clubbing, none, atrophy- none, strength- nl Neuro- grossly intact to observation

## 2012-09-27 NOTE — Patient Instructions (Addendum)
We can continue CPAP 17/ [facility]  Please keep trying to stop smoking, one day at a time.  Consider looking at electronic cigarettes with low nicotine doses.  Flu vax  Order- CXR  Dx COPD

## 2012-10-03 NOTE — Progress Notes (Signed)
Quick Note:  Pt aware of results. ______ 

## 2012-10-04 NOTE — Assessment & Plan Note (Signed)
He uses CPAP every night with good compliance and control

## 2012-10-04 NOTE — Assessment & Plan Note (Signed)
We reviewed expectations with patches and other nicotine sources.

## 2012-10-04 NOTE — Assessment & Plan Note (Signed)
He is talking about trying to slow down his smoking and we talked about support measures, encouraging to press forward with this effort. Plan-chest x-ray, flu vaccine

## 2012-11-02 DIAGNOSIS — R739 Hyperglycemia, unspecified: Secondary | ICD-10-CM | POA: Insufficient documentation

## 2012-12-24 ENCOUNTER — Encounter: Payer: Self-pay | Admitting: Vascular Surgery

## 2012-12-25 ENCOUNTER — Encounter: Payer: Self-pay | Admitting: Vascular Surgery

## 2012-12-25 ENCOUNTER — Ambulatory Visit (INDEPENDENT_AMBULATORY_CARE_PROVIDER_SITE_OTHER): Payer: Medicare Other | Admitting: Vascular Surgery

## 2012-12-25 VITALS — BP 137/75 | HR 66 | Ht 65.5 in | Wt 210.9 lb

## 2012-12-25 DIAGNOSIS — I7092 Chronic total occlusion of artery of the extremities: Secondary | ICD-10-CM

## 2012-12-25 DIAGNOSIS — Z48812 Encounter for surgical aftercare following surgery on the circulatory system: Secondary | ICD-10-CM

## 2012-12-25 DIAGNOSIS — I739 Peripheral vascular disease, unspecified: Secondary | ICD-10-CM

## 2012-12-25 NOTE — Progress Notes (Signed)
Ankle brachial index performed @ [facility] 12/25/2012

## 2012-12-25 NOTE — Progress Notes (Signed)
Subjective:     Patient ID: Dean [name], male   DOB: [birth year], 67 y.o.   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 His claudication symptoms are stable. He does have significant back arthritic lumbar spine problems which apparently are not amenable to surgery. His biggest issue is his back pain. He has no severe calf claudication.  Past Medical History  Diagnosis Date  . Hyperlipidemia   . Hypertension   . Sleep apnea   . CAD (coronary artery disease)     s/p angioplasty and stenting x3 1994  . Peripheral vascular disease   . Allergy     pollen  . Asthma   . Arthritis   . COPD (chronic obstructive pulmonary disease)   . GERD (gastroesophageal reflux disease)   . Kidney stone     History  Substance Use Topics  . Smoking status: Current Every Day Smoker -- 1.0 packs/day for 42 years    Types: Cigarettes  . Smokeless tobacco: Never Used  . Alcohol Use: 0.0 oz/week     Comment: rarely    Family History  Problem Relation Age of Onset  . Other Father     ETOH  . Kidney cancer Mother   . Cancer Mother     renal  . Cancer Sister     breast    Allergies  Allergen Reactions  . Penicillins     REACTION: rapid pulse    Current outpatient prescriptions:albuterol (PROVENTIL HFA) 108 (90 BASE) MCG/ACT inhaler, Inhale 2 puffs into the lungs every 6 (six) hours as needed. , Disp: , Rfl: ;  aspirin 325 MG buffered tablet, Take 325 mg by mouth daily.  , Disp: , Rfl: ;  atorvastatin (LIPITOR) 10 MG tablet, Take 10 mg by mouth daily.  , Disp: , Rfl: ;  Diclofenac Sodium CR 100 MG 24 hr tablet, Take 100 mg by mouth daily.  , Disp: , Rfl:  diltiazem (CARDIZEM CD) 240 MG 24 hr capsule, Take 240 mg by mouth daily.  , Disp: , Rfl: ;  esomeprazole (NEXIUM) 40 MG capsule, Take 40 mg by mouth daily before breakfast., Disp: , Rfl: ;  finasteride (PROSCAR) 5 MG tablet, Take 1 tablet by mouth daily.,  Disp: , Rfl: ;  fluticasone (FLONASE) 50 MCG/ACT nasal spray, Place 1-2 sprays into the nose daily., Disp: 16 g, Rfl: prn isosorbide mononitrate (IMDUR) 60 MG 24 hr tablet, Take 60 mg by mouth daily.  , Disp: , Rfl: ;  lisinopril (PRINIVIL,ZESTRIL) 20 MG tablet, Take 20 mg by mouth daily.  , Disp: , Rfl: ;  nitroGLYCERIN (NITRODUR - DOSED IN MG/24 HR) 0.4 mg/hr, Place 1 patch onto the skin as directed.  , Disp: , Rfl: ;  Vitamin D, Ergocalciferol, (DRISDOL) 50000 UNITS CAPS, Take 1 tablet by mouth Once a week., Disp: , Rfl:   BP 137/75  Pulse 66  Ht 5' 5.5" (1.664 m)  Wt 210 lb 14.4 oz (95.664 kg)  BMI 34.56 kg/m2  SpO2 92%  Body mass index is 34.56 kg/(m^2).           Review of Systems denies chest pain but does have dyspnea on exertion. Severe back discomfort. Denies lateralizing weakness, aphasia, amaurosis fugax, or other strokelike symptoms.     Objective:   Physical Exam blood pressure 135/75 heart rate 66 respirations 18 Gen.-alert and oriented x3 in no apparent distress HEENT normal for age  Lungs no rhonchi or wheezing Cardiovascular regular rhythm no murmurs carotid pulses 3+ palpable no bruits audible Abdomen soft nontender no palpable masses Musculoskeletal free of  major deformities Skin clear -no rashes Neurologic normal Lower extremities 3+ femoral pulses bilaterally. No pedal pulses are palpable but both feet are well perfused.  Today I ordered lower extremity arterial Dopplers. ABIs are 0.73 on the right 0.77 on the left. This is slightly down from previous study done in January 2013.        Assessment:      fairly stable  ABIs 16 months post right to left femoral femoral bypass    Plan:     Return one year with duplex scan of femoral-femoral bypass and bilateral ABIs and see nurse practitioner

## 2013-01-14 ENCOUNTER — Other Ambulatory Visit: Payer: Self-pay | Admitting: *Deleted

## 2013-01-14 DIAGNOSIS — Z48812 Encounter for surgical aftercare following surgery on the circulatory system: Secondary | ICD-10-CM

## 2013-01-14 DIAGNOSIS — I739 Peripheral vascular disease, unspecified: Secondary | ICD-10-CM

## 2013-02-02 ENCOUNTER — Other Ambulatory Visit: Payer: Self-pay

## 2013-03-28 ENCOUNTER — Ambulatory Visit (INDEPENDENT_AMBULATORY_CARE_PROVIDER_SITE_OTHER): Payer: Medicare Other | Admitting: Internal Medicine

## 2013-03-28 ENCOUNTER — Encounter: Payer: Self-pay | Admitting: Internal Medicine

## 2013-03-28 VITALS — BP 138/78 | HR 78 | Ht 65.75 in | Wt 216.8 lb

## 2013-03-28 DIAGNOSIS — J309 Allergic rhinitis, unspecified: Secondary | ICD-10-CM

## 2013-03-28 DIAGNOSIS — J4489 Other specified chronic obstructive pulmonary disease: Secondary | ICD-10-CM

## 2013-03-28 DIAGNOSIS — F17200 Nicotine dependence, unspecified, uncomplicated: Secondary | ICD-10-CM

## 2013-03-28 DIAGNOSIS — J449 Chronic obstructive pulmonary disease, unspecified: Secondary | ICD-10-CM

## 2013-03-28 DIAGNOSIS — G4733 Obstructive sleep apnea (adult) (pediatric): Secondary | ICD-10-CM

## 2013-03-28 MED ORDER — OLMESARTAN MEDOXOMIL 40 MG PO TABS: 40.0000 mg | ORAL_TABLET | Freq: Every day | ORAL | Status: DC

## 2013-03-28 MED ORDER — FLUTICASONE FUROATE-VILANTEROL 100-25 MCG/INH IN AEPB: 1.0000 | INHALATION_SPRAY | Freq: Every day | RESPIRATORY_TRACT | Status: AC

## 2013-03-28 MED ORDER — AZELASTINE-FLUTICASONE 137-50 MCG/ACT NA SUSP: 1.0000 | Freq: Every day | NASAL | Status: DC

## 2013-03-28 NOTE — Progress Notes (Signed)
Patient ID: Dean [name], male    DOB: [birth year], 67 y.o.   MRN: [id number]  HPI 03/29/11- 65 yoM , ongoing smoker despite continued counseling efforts, followed here for COPD, obstructive sleep apnea, lung nodule, allergic rhinitis. Last here September 29, 2010, when PFT reviewed for 2010- FEV1/FVC 0.48 indicating severe COPD. Did well through the winter.  Now Spring pollen season is bothering him with itching eyes, watering eyes and nose.. Ears don't pop or hurt. Chest tightness and morning cough are also worse lately- blaming pollen. Denies discolored sputum, chest pain or palpitation. Actively uncomfortable with GERD- working with Dr Henrene Pastor.   09/28/11- 67 yoM , ongoing smoker despite continued counseling efforts, followed here for COPD, obstructive sleep apnea, lung nodule, allergic rhinitis. GERD is better. 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 Kellie Simmering. During that time he nearly quit cigarettes but admits still smoking about 4 per day. He has a written taper schedule plan for smoking cessation. I strongly encouraged him in this effort. He had no restart toric complications from his surgery. He is very much aware of shortness of breath. Still has daily cough with clear phlegm, no blood and no chest pain. He does complain of some sinus pressure with little nasal discharge. He continues able to use CPAP all night every night and now has a new mask which he likes. He feels this problem is controlled.   03/28/12-  45 yoM , ongoing smoker despite continued counseling efforts, followed here for COPD, obstructive sleep apnea, lung nodule, allergic rhinitis. Activity is limited more by back pain and his breathing. He continues to smoke. Now pollen is bothering him with nasal congestion. Using nasal saline. Admits cough with white sputum. Denies blood, chest pain, palpitation or recent infection. IMPRESSION: CXR- 09/01/11- reviewed with pt Interval decreased interstitial prominence.  Mild  left lung base/retrocardiac opacity is likely scarring or  atelectasis. A mild/early infiltrate is not excluded in the  appropriate clinical setting.  Original Report Authenticated By: Suanne Marker, M.D.   05/04/12-  58 yoM , ongoing smoker despite continued counseling efforts, followed here for COPD, obstructive sleep apnea, lung nodule, allergic rhinitis. He had called wanting to try a neb treatment before deciding if a home neb would work better than his MDI. He continues to smoke against advice.  09/27/12- 51 yoM , ongoing smoker despite continued counseling efforts, followed here for COPD, obstructive sleep apnea, lung nodule, allergic rhinitis. CPAP [facility] 17-  good  Still has SOB at times; breathing has gotten better since last visit. No acute events and less cough and wheeze as he has decreased smoking. Chronic back pain limits activity. COPD assessment test (CABG) score 12/40 CXR 09/01/11- reviewed with him IMPRESSION:  Interval decreased interstitial prominence.  Mild left lung base/retrocardiac opacity is likely scarring or  atelectasis. A mild/early infiltrate is not excluded in the  appropriate clinical setting.  Original Report Authenticated By: Suanne Marker, M.D.   03/28/13- 67 yoM , ongoing smoker despite continued counseling efforts, followed here for COPD, obstructive sleep apnea, lung nodule, allergic rhinitis. CPAP 17/ [facility] FOLLOWS FOR:SOB worse with activity, wheezing, cough and congestion-productive-clear in color. Denies any fever or chills. Still smoking. We discussed support again. Tree pollen is bothering him some with nasal congestion and light cough. We discussed lisinopril as a possible cause of cough which could be checked by changing to an ARB for trial.  ROS-see HPI Constitutional:   No-   weight  loss, night sweats, fevers, chills, fatigue, lassitude. HEENT:   No-  headaches, difficulty swallowing, tooth/dental problems, sore throat,       No-   sneezing, itching, ear ache, nasal congestion, post nasal drip,  CV:  No-   chest pain, orthopnea, PND, swelling in lower extremities, anasarca,  dizziness, palpitations Resp: + shortness of breath with exertion or at rest.              No-   productive cough,  + non-productive cough,  No- coughing up of blood.              No-   change in color of mucus.  + wheezing.   Skin: No-   rash or lesions. GI:  No-   heartburn, indigestion, abdominal pain, nausea, vomiting,  GU:  MS:  No-   joint pain or swelling.  . Neuro-     nothing unusual Psych:  No- change in mood or affect. No depression or anxiety.  No memory loss.  Objective:  OBJ- Physical Exam General- Alert, Oriented, Affect-appropriate, Distress- none acute. Odor of tobacco Skin- rash-none, lesions- none, excoriation- none Lymphadenopathy- none Head- atraumatic            Eyes- Gross vision intact, PERRLA, conjunctivae and secretions clear            Ears- Hearing, canals-normal            Nose- Clear, no-Septal dev, mucus, polyps, erosion, perforation             Throat- Mallampati II , mucosa clear , drainage- none, tonsils- atrophic Neck- flexible , trachea midline, no stridor , thyroid nl, carotid no bruit Chest - symmetrical excursion , unlabored           Heart/CV- RRR , no murmur , no gallop  , no rub, nl s1 s2                           - JVD- none , edema- none, stasis changes- none, varices- none           Lung- + diminished, wheeze- none, rattling cough+with deep breath , dullness-none, rub- none           Chest wall-  Abd-  Br/ Gen/ Rectal- Not done, not indicated Extrem- cyanosis- none, clubbing, none, atrophy- none, strength- nl Neuro- grossly intact to observation

## 2013-03-28 NOTE — Patient Instructions (Addendum)
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 Try this for 1 month instead of lisinopril to see if the cough gets better. When out of Benicar, go back to lisinopril and discuss with Dr Reynaldo Minium.   Sample Breo Ellipta inhaler    Try 1 puff then rinse mouth, once daily  Sample Dymista nasal spray   1-2 puffs each nostril once daily at bedtime   See what you think of this compared with fluticasone

## 2013-04-05 NOTE — Assessment & Plan Note (Signed)
Good compliance and control with no changes needed.

## 2013-04-05 NOTE — Assessment & Plan Note (Signed)
Plan-try sample Dymista nasal spray with discussion

## 2013-04-05 NOTE — Assessment & Plan Note (Signed)
Smoking cessation was discussed again. He hopes he will be able to quit someday.

## 2013-04-05 NOTE — Assessment & Plan Note (Signed)
We discussed interaction of his ongoing smoking with spring pollens. Plan-sample Breo Ellipta for trial. Try changing from lisinopril to Benicar 40 mg x1 month to see what effect that has on his cough.

## 2013-04-15 ENCOUNTER — Telehealth: Payer: Self-pay | Admitting: Internal Medicine

## 2013-04-15 MED ORDER — AZELASTINE-FLUTICASONE 137-50 MCG/ACT NA SUSP: 1.0000 | Freq: Every day | NASAL | Status: DC

## 2013-04-15 MED ORDER — FLUTICASONE FUROATE-VILANTEROL 100-25 MCG/INH IN AEPB: 1.0000 | INHALATION_SPRAY | Freq: Every day | RESPIRATORY_TRACT | Status: DC

## 2013-04-15 NOTE — Telephone Encounter (Signed)
Per CY-Rx Dymista #1 1-2 sprays in each nostril once daily at bedtime and Rx Breo Ellipta #1 1 puff and Rinse mouth once daily.  Reason to try Benicar was to see if cough improved on this compare with what he was taking before.

## 2013-04-15 NOTE — Telephone Encounter (Signed)
I spoke with pt. 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 I advised regarding BP medication he needed to call his PCP regarding that as stated in last ov. Please advise regaridng rx's Dr. Annamaria Boots thanks  Last OV 03/28/13 Pending 09/27/13

## 2013-04-15 NOTE — Telephone Encounter (Signed)
Spoke with pt and notified of recs per CDY He verbalized understanding  Nothing further needed

## 2013-06-24 DIAGNOSIS — R05 Cough: Secondary | ICD-10-CM | POA: Insufficient documentation

## 2013-07-24 ENCOUNTER — Other Ambulatory Visit: Payer: Self-pay

## 2013-09-27 ENCOUNTER — Ambulatory Visit (INDEPENDENT_AMBULATORY_CARE_PROVIDER_SITE_OTHER): Payer: Medicare Other | Admitting: Internal Medicine

## 2013-09-27 ENCOUNTER — Encounter: Payer: Self-pay | Admitting: Internal Medicine

## 2013-09-27 VITALS — BP 114/70 | HR 62 | Ht 65.75 in | Wt 209.6 lb

## 2013-09-27 DIAGNOSIS — F17200 Nicotine dependence, unspecified, uncomplicated: Secondary | ICD-10-CM

## 2013-09-27 DIAGNOSIS — Z23 Encounter for immunization: Secondary | ICD-10-CM

## 2013-09-27 DIAGNOSIS — G4733 Obstructive sleep apnea (adult) (pediatric): Secondary | ICD-10-CM

## 2013-09-27 DIAGNOSIS — J449 Chronic obstructive pulmonary disease, unspecified: Secondary | ICD-10-CM

## 2013-09-27 NOTE — Patient Instructions (Signed)
Flu vax  Please keep trying to stop smoking  Order- [facility] need CPAP autotitrate 5-20 x 7 days for pressure recommendation  We will ask Dr Jacquiline Doe office for the chest xray report from this summer  Please call as needed

## 2013-09-27 NOTE — Progress Notes (Signed)
Patient ID: Dean [name], male    DOB: 01-Nov-1946, 67 y.o.   MRN: [id number]  HPI 03/29/11- 65 yoM , ongoing smoker despite continued counseling efforts, followed here for COPD, obstructive sleep apnea, lung nodule, allergic rhinitis. Last here September 29, 2010, when PFT reviewed for 2010- FEV1/FVC 0.48 indicating severe COPD. Did well through the winter.  Now Spring pollen season is bothering him with itching eyes, watering eyes and nose.. Ears don't pop or hurt. Chest tightness and morning cough are also worse lately- blaming pollen. Denies discolored sputum, chest pain or palpitation. Actively uncomfortable with GERD- working with Dr Henrene Pastor.   09/28/11- 47 yoM , ongoing smoker despite continued counseling efforts, followed here for COPD, obstructive sleep apnea, lung nodule, allergic rhinitis. GERD is better. 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 Kellie Simmering. During that time he nearly quit cigarettes but admits still smoking about 4 per day. He has a written taper schedule plan for smoking cessation. I strongly encouraged him in this effort. He had no restart toric complications from his surgery. He is very much aware of shortness of breath. Still has daily cough with clear phlegm, no blood and no chest pain. He does complain of some sinus pressure with little nasal discharge. He continues able to use CPAP all night every night and now has a new mask which he likes. He feels this problem is controlled.   03/28/12-  89 yoM , ongoing smoker despite continued counseling efforts, followed here for COPD, obstructive sleep apnea, lung nodule, allergic rhinitis. Activity is limited more by back pain and his breathing. He continues to smoke. Now pollen is bothering him with nasal congestion. Using nasal saline. Admits cough with white sputum. Denies blood, chest pain, palpitation or recent infection. IMPRESSION: CXR- 09/01/11- reviewed with pt Interval decreased interstitial prominence.  Mild  left lung base/retrocardiac opacity is likely scarring or  atelectasis. A mild/early infiltrate is not excluded in the  appropriate clinical setting.  Original Report Authenticated By: Suanne Marker, M.D.   05/04/12-  78 yoM , ongoing smoker despite continued counseling efforts, followed here for COPD, obstructive sleep apnea, lung nodule, allergic rhinitis. He had called wanting to try a neb treatment before deciding if a home neb would work better than his MDI. He continues to smoke against advice.  09/27/12- 89 yoM , ongoing smoker despite continued counseling efforts, followed here for COPD, obstructive sleep apnea, lung nodule, allergic rhinitis. CPAP [employer] 17-  good  Still has SOB at times; breathing has gotten better since last visit. No acute events and less cough and wheeze as he has decreased smoking. Chronic back pain limits activity. COPD assessment test (CABG) score 12/40 CXR 09/01/11- reviewed with him IMPRESSION:  Interval decreased interstitial prominence.  Mild left lung base/retrocardiac opacity is likely scarring or  atelectasis. A mild/early infiltrate is not excluded in the  appropriate clinical setting.  Original Report Authenticated By: Suanne Marker, M.D.   03/28/13- 67 yoM , ongoing smoker despite continued counseling efforts, followed here for COPD, obstructive sleep apnea, lung nodule, allergic rhinitis. CPAP 17/ [facility] FOLLOWS FOR:SOB worse with activity, wheezing, cough and congestion-productive-clear in color. Denies any fever or chills. Still smoking. We discussed support again. Tree pollen is bothering him some with nasal congestion and light cough. We discussed lisinopril as a possible cause of cough which could be checked by changing to an ARB for trial.  09/27/09-67 yoM, ongoing smoker despite continued counseling efforts, followed  here for COPD, obstructive sleep apnea, lung nodule, allergic rhinitis FOLLOWS FOR: dong pretty good overall; has  good and bad days with SOB and wheezing. CPAP 17/ [name] We discussed his smoking again. Occasional shortness of breath with cough. Came back from visiting [place] with a bronchitis in July. Treated by Dr. Reynaldo Minium with chest x-ray. Compliant with CPAP but still needs occasional nap. Back pain affects sleep quality. CXR 09/27/12 IMPRESSION:  No evidence of acute cardiopulmonary disease.  Original Report Authenticated By: Julian Hy, M.D.   ROS-see HPI Constitutional:   No-   weight loss, night sweats, fevers, chills, fatigue, lassitude. HEENT:   No-  headaches, difficulty swallowing, tooth/dental problems, sore throat,       No-  sneezing, itching, ear ache, nasal congestion, post nasal drip,  CV:  No-   chest pain, orthopnea, PND, swelling in lower extremities, anasarca,  dizziness, palpitations Resp: + shortness of breath with exertion or at rest.              No-   productive cough,  + non-productive cough,  No- coughing up of blood.              No-   change in color of mucus.  + wheezing.   Skin: No-   rash or lesions. GI:  No-   heartburn, indigestion, abdominal pain, nausea, vomiting,  GU:  MS:  No-   joint pain or swelling.  . Neuro-     nothing unusual Psych:  No- change in mood or affect. No depression or anxiety.  No memory loss.  Objective:  OBJ- Physical Exam General- Alert, Oriented, Affect-appropriate, Distress- none acute. Odor of tobacco Skin- rash-none, lesions- none, excoriation- none Lymphadenopathy- none Head- atraumatic            Eyes- Gross vision intact, PERRLA, conjunctivae and secretions clear            Ears- Hearing, canals-normal            Nose- Clear, no-Septal dev, mucus, polyps, erosion, perforation             Throat- Mallampati II , mucosa clear , drainage- none, tonsils- atrophic Neck- flexible , trachea midline, no stridor , thyroid nl, carotid no bruit Chest - symmetrical excursion , unlabored           Heart/CV- RRR , no murmur , no  gallop  , no rub, nl s1 s2                           - JVD- none , edema- none, stasis changes- none, varices- none           Lung- + diminished, wheeze- none,  cough-none, dullness-none, rub- none           Chest wall-  Abd-  Br/ Gen/ Rectal- Not done, not indicated Extrem- cyanosis- none, clubbing, none, atrophy- none, strength- nl Neuro- grossly intact to observation

## 2013-10-13 ENCOUNTER — Encounter: Payer: Self-pay | Admitting: Internal Medicine

## 2013-10-13 NOTE — Assessment & Plan Note (Signed)
Plan- [employer] do autotitration for pressure recommendation

## 2013-10-13 NOTE — Assessment & Plan Note (Signed)
I have reviewed opportunities and pushed him to make a serious smoking cessation effort.

## 2013-10-13 NOTE — Assessment & Plan Note (Signed)
Slow to resolve a bronchitis after acute episode this summer. Plan-flu shot, CXR/ report from Dr Reynaldo Minium

## 2013-11-06 DIAGNOSIS — Z1331 Encounter for screening for depression: Secondary | ICD-10-CM | POA: Insufficient documentation

## 2013-12-20 ENCOUNTER — Encounter: Payer: Self-pay | Admitting: Family

## 2013-12-23 ENCOUNTER — Ambulatory Visit (INDEPENDENT_AMBULATORY_CARE_PROVIDER_SITE_OTHER)
Admission: RE | Admit: 2013-12-23 | Discharge: 2013-12-23 | Disposition: A | Discharge status: Home / Self Care | Payer: Medicare Other | Source: Ambulatory Visit | Attending: Vascular Surgery | Admitting: Vascular Surgery

## 2013-12-23 ENCOUNTER — Ambulatory Visit (HOSPITAL_COMMUNITY)
Admission: RE | Admit: 2013-12-23 | Discharge: 2013-12-23 | Disposition: A | Discharge status: Home / Self Care | Payer: Medicare Other | Source: Ambulatory Visit | Attending: Vascular Surgery | Admitting: Vascular Surgery

## 2013-12-23 ENCOUNTER — Other Ambulatory Visit: Payer: Self-pay | Admitting: Vascular Surgery

## 2013-12-23 ENCOUNTER — Encounter: Payer: Self-pay | Admitting: Family

## 2013-12-23 ENCOUNTER — Ambulatory Visit (INDEPENDENT_AMBULATORY_CARE_PROVIDER_SITE_OTHER): Payer: Medicare Other | Admitting: Family

## 2013-12-23 VITALS — BP 145/82 | HR 58 | Resp 14 | Ht 65.5 in | Wt 206.0 lb

## 2013-12-23 DIAGNOSIS — I7092 Chronic total occlusion of artery of the extremities: Secondary | ICD-10-CM

## 2013-12-23 DIAGNOSIS — Z48812 Encounter for surgical aftercare following surgery on the circulatory system: Secondary | ICD-10-CM

## 2013-12-23 DIAGNOSIS — I739 Peripheral vascular disease, unspecified: Secondary | ICD-10-CM

## 2013-12-23 NOTE — Progress Notes (Signed)
[facility] HISTORY AND PHYSICAL -PAD   History of Present Illness Dean [name] is a 68 y.o. male patient of Dr. 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 He returns today for routine surveillance.   He does have significant back arthritic lumbar spine problems which apparently are not amenable to surgery. His biggest issue is his back pain. His low back gives out before his legs do. He denies non-healing wounds, denies rest pain.  Patient denies New Medical or Surgical History.  Pt Diabetic: No Pt smoker: smoker  (1.5 ppd x 46 yrs)  Pt meds include: Statin :Yes ASA: Yes Other anticoagulants/antiplatelets: no  Past Medical History  Diagnosis Date  . Hyperlipidemia   . Hypertension   . Sleep apnea   . CAD (coronary artery disease)     s/p angioplasty and stenting x3 1994  . Peripheral vascular disease   . Allergy     pollen  . Asthma   . Arthritis   . COPD (chronic obstructive pulmonary disease)   . GERD (gastroesophageal reflux disease)   . Kidney stone     Social History History  Substance Use Topics  . Smoking status: Current Every Day Smoker -- 0.25 packs/day for 42 years    Types: Cigarettes  . Smokeless tobacco: Never Used     Comment: use e cig as well  . Alcohol Use: 0.0 oz/week     Comment: rarely    Family History Family History  Problem Relation Age of Onset  . Other Father     ETOH  . Kidney cancer Mother   . Cancer Mother     renal  . Cancer Sister     breast    Past Surgical History  Procedure Laterality Date  . Coronary angioplasty with stent placement  1994  . Lumbar disectomy  2005    L3 - 4 - 5  . Cholecystectomy  1990s  . Appendectomy    . Umbilical hernia repair    . Tonsillectomy    . Pr vein bypass graft,aorto-fem-pop  08/31/11    Right to Left Fem-Fem    Allergies  Allergen Reactions  . Penicillins     REACTION: rapid pulse     Current Outpatient Prescriptions  Medication Sig Dispense Refill  . albuterol (PROVENTIL HFA) 108 (90 BASE) MCG/ACT inhaler Inhale 2 puffs into the lungs every 6 (six) hours as needed.       Marland Kitchen aspirin 325 MG buffered tablet Take 325 mg by mouth daily.        Marland Kitchen atorvastatin (LIPITOR) 10 MG tablet Take 10 mg by mouth daily.        . Azelastine-Fluticasone 137-50 MCG/ACT SUSP Place 1-2 sprays into the nose at bedtime.  23 g  5  . Diclofenac Sodium CR 100 MG 24 hr tablet Take 100 mg by mouth daily.        Marland Kitchen diltiazem (CARDIZEM CD) 240 MG 24 hr capsule Take 240 mg by mouth daily.        Marland Kitchen esomeprazole (NEXIUM) 40 MG capsule Take 40 mg by mouth daily before breakfast.      . finasteride (PROSCAR) 5 MG tablet Take 1 tablet by mouth daily.      . isosorbide mononitrate (IMDUR) 60 MG 24 hr tablet Take 60 mg by mouth daily.        Marland Kitchen lisinopril (PRINIVIL,ZESTRIL) 20 MG tablet Take 20 mg by mouth  daily.        . nitroGLYCERIN (NITRODUR - DOSED IN MG/24 HR) 0.4 mg/hr Place 1 patch onto the skin as directed.        . olmesartan (BENICAR) 40 MG tablet Take 1 tablet (40 mg total) by mouth daily.  30 tablet  0  . Vitamin D, Ergocalciferol, (DRISDOL) 50000 UNITS CAPS Take 1 tablet by mouth Once a week.       No current facility-administered medications for this visit.    ROS: See HPI for pertinent positives and negatives.   Physical Examination  Filed Vitals:   12/23/13 1227  BP: 145/82  Pulse: 58  Resp: 14   Filed Weights   12/23/13 1227  Weight: 206 lb (93.441 kg)   Body mass index is 33.75 kg/(m^2).  General: A&O x 3, WDWN, obese. Gait: normal Eyes: PERRLA, Pulmonary: CTAB, diminished breath sounds in all fields, without wheezes , rales or rhonchi Cardiac: regular Rythm , without detected murmur          Carotid Bruits Left Right   Negative Negative  Aorta: is not palpable Radial pulses: 3+ palpable and =                           VASCULAR EXAM: Extremities without ischemic  changes  without Gangrene; without open wounds.                                                                                                          LE Pulses LEFT RIGHT       FEMORAL  not palpable   palpable        POPLITEAL  not palpable   not palpable       POSTERIOR TIBIAL  not palpable   not palpable        DORSALIS PEDIS      ANTERIOR TIBIAL  palpable  not palpable    Abdomen: soft, NT, no masses. Skin: no rashes, no ulcers noted. Musculoskeletal: no muscle wasting or atrophy.  Neurologic: A&O X 3; Appropriate Affect ; SENSATION: normal; MOTOR FUNCTION:  moving all extremities equally, motor strength 5/5 throughout. Speech is fluent/normal. CN 2-12 intact.    Non-Invasive Vascular Imaging: DATE: 12/23/2013 ABI: RIGHT 0.76, Waveforms: monophasic;  LEFT 0.74, Waveforms: monophasic in PT, biphasic in DP. DUPLEX SCAN OF BYPASS: Patent [id number].  ASSESSMENT: Dean [name] is a 68 y.o. 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 He does have significant back arthritic lumbar spine problems which apparently are not amenable to surgery. He has no symptoms referable to claudication, no evidence of ischemia in LE's. 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 His right femoral to femoral bypass graft is patent by Duplex today. Unfortunately he continues to smoke; this and obesity are his 2 major risk factors for the progression of atherosclerosis.   PLAN:  I discussed in depth with the patient the nature of atherosclerosis, and emphasized  the importance of maximal medical management including strict control of blood pressure, blood glucose, and lipid levels, obtaining regular exercise, and cessation of smoking.  The patient is aware that without maximal medical management the underlying atherosclerotic disease process will progress, limiting the benefit of any  interventions. Based on the patient's vascular studies and examination, pt will return to clinic in 1 year for ABI's and LE arterial Duplex for fem-fem BPG.  The patient was counseled re smoking cessation.  The patient was given information about PAD including signs, symptoms, treatment, what symptoms should prompt the patient to seek immediate medical care, and risk reduction measures to take.  Clemon Chambers, RN, MSN, FNP-C [facility] Phone: [phone]  Clinic MD: Kellie Simmering  12/23/2013 11:40 AM

## 2013-12-23 NOTE — Patient Instructions (Addendum)
Peripheral Vascular Disease Peripheral Vascular Disease (PVD), also called Peripheral Arterial Disease (PAD), is a circulation problem caused by cholesterol (atherosclerotic plaque) deposits in the arteries. PVD commonly occurs in the lower extremities (legs) but it can occur in other areas of the body, such as your arms. The cholesterol buildup in the arteries reduces blood flow which can cause pain and other serious problems. The presence of PVD can place a person at risk for Coronary Artery Disease (CAD).  CAUSES  Causes of PVD can be many. It is usually associated with more than one risk factor such as:   High Cholesterol.  Smoking.  Diabetes.  Lack of exercise or inactivity.  High blood pressure (hypertension).  Obesity.  Family history. SYMPTOMS   When the lower extremities are affected, patients with PVD may experience:  Leg pain with exertion or physical activity. This is called INTERMITTENT CLAUDICATION. This may present as cramping or numbness with physical activity. The location of the pain is associated with the level of blockage. For example, blockage at the abdominal level (distal abdominal aorta) may result in buttock or hip pain. Lower leg arterial blockage may result in calf pain.  As PVD becomes more severe, pain can develop with less physical activity.  In people with severe PVD, leg pain may occur at rest.  Other PVD signs and symptoms:  Leg numbness or weakness.  Coldness in the affected leg or foot, especially when compared to the other leg.  A change in leg color.  Patients with significant PVD are more prone to ulcers or sores on toes, feet or legs. These may take longer to heal or may reoccur. The ulcers or sores can become infected.  If signs and symptoms of PVD are ignored, gangrene may occur. This can result in the loss of toes or loss of an entire limb.  Not all leg pain is related to PVD. Other medical conditions can cause leg pain such  as:  Blood clots (embolism) or Deep Vein Thrombosis.  Inflammation of the blood vessels (vasculitis).  Spinal stenosis. DIAGNOSIS  Diagnosis of PVD can involve several different types of tests. These can include:  Pulse Volume Recording Method (PVR). This test is simple, painless and does not involve the use of X-rays. PVR involves measuring and comparing the blood pressure in the arms and legs. An ABI (Ankle-Brachial Index) is calculated. The normal ratio of blood pressures is 1. As this number becomes smaller, it indicates more severe disease.  < 0.95  indicates significant narrowing in one or more leg vessels.  <0.8 there will usually be pain in the foot, leg or buttock with exercise.  <0.4 will usually have pain in the legs at rest.  <0.25  usually indicates limb threatening PVD.  Doppler detection of pulses in the legs. This test is painless and checks to see if you have a pulses in your legs/feet.  A dye or contrast material (a substance that highlights the blood vessels so they show up on x-ray) may be given to help your caregiver better see the arteries for the following tests. The dye is eliminated from your body by the kidney's. Your caregiver may order blood work to check your kidney function and other laboratory values before the following tests are performed:  Magnetic Resonance Angiography (MRA). An MRA is a picture study of the blood vessels and arteries. The MRA machine uses a large magnet to produce images of the blood vessels.  Computed Tomography Angiography (CTA). A CTA is a   specialized x-ray that looks at how the blood flows in your blood vessels. An IV may be inserted into your arm so contrast dye can be injected.  Angiogram. Is a procedure that uses x-rays to look at your blood vessels. This procedure is minimally invasive, meaning a small incision (cut) is made in your groin. A small tube (catheter) is then inserted into the artery of your groin. The catheter is  guided to the blood vessel or artery your caregiver wants to examine. Contrast dye is injected into the catheter. X-rays are then taken of the blood vessel or artery. After the images are obtained, the catheter is taken out. TREATMENT  Treatment of PVD involves many interventions which may include:  Lifestyle changes:  Quitting smoking.  Exercise.  Following a low fat, low cholesterol diet.  Control of diabetes.  Foot care is very important to the PVD patient. Good foot care can help prevent infection.  Medication:  Cholesterol-lowering medicine.  Blood pressure medicine.  Anti-platelet drugs.  Certain medicines may reduce symptoms of Intermittent Claudication.  Interventional/Surgical options:  Angioplasty. An Angioplasty is a procedure that inflates a balloon in the blocked artery. This opens the blocked artery to improve blood flow.  Stent Implant. A wire mesh tube (stent) is placed in the artery. The stent expands and stays in place, allowing the artery to remain open.  Peripheral Bypass Surgery. This is a surgical procedure that reroutes the blood around a blocked artery to help improve blood flow. This type of procedure may be performed if Angioplasty or stent implants are not an option. SEEK IMMEDIATE MEDICAL CARE IF:   You develop pain or numbness in your arms or legs.  Your arm or leg turns cold, becomes blue in color.  You develop redness, warmth, swelling and pain in your arms or legs. MAKE SURE YOU:   Understand these instructions.  Will watch your condition.  Will get help right away if you are not doing well or get worse. Document Released: 01/12/2005 Document Revised: 02/27/2012 Document Reviewed: 12/09/2008 Exit[employer] Patient Information 2014 [employer].   Smoking Cessation Quitting smoking is important to your health and has many advantages. However, it is not always easy to quit since nicotine is a very addictive drug. Often times, people try 3  times or more before being able to quit. This document explains the best ways for you to prepare to quit smoking. Quitting takes hard work and a lot of effort, but you can do it. ADVANTAGES OF QUITTING SMOKING  You will live longer, feel better, and live better.  Your body will feel the impact of quitting smoking almost immediately.  Within 20 minutes, blood pressure decreases. Your pulse returns to its normal level.  After 8 hours, carbon monoxide levels in the blood return to normal. Your oxygen level increases.  After 24 hours, the chance of having a heart attack starts to decrease. Your breath, hair, and body stop smelling like smoke.  After 48 hours, damaged nerve endings begin to recover. Your sense of taste and smell improve.  After 72 hours, the body is virtually free of nicotine. Your bronchial tubes relax and breathing becomes easier.  After 2 to 12 weeks, lungs can hold more air. Exercise becomes easier and circulation improves.  The risk of having a heart attack, stroke, cancer, or lung disease is greatly reduced.  After 1 year, the risk of coronary heart disease is cut in half.  After 5 years, the risk of stroke falls to   the same as a nonsmoker.  After 10 years, the risk of lung cancer is cut in half and the risk of other cancers decreases significantly.  After 15 years, the risk of coronary heart disease drops, usually to the level of a nonsmoker.  If you are pregnant, quitting smoking will improve your chances of having a healthy baby.  The people you live with, especially any children, will be healthier.  You will have extra money to spend on things other than cigarettes. QUESTIONS TO THINK ABOUT BEFORE ATTEMPTING TO QUIT You may want to talk about your answers with your caregiver.  Why do you want to quit?  If you tried to quit in the past, what helped and what did not?  What will be the most difficult situations for you after you quit? How will you plan to  handle them?  Who can help you through the tough times? Your family? Friends? A caregiver?  What pleasures do you get from smoking? What ways can you still get pleasure if you quit? Here are some questions to ask your caregiver:  How can you help me to be successful at quitting?  What medicine do you think would be best for me and how should I take it?  What should I do if I need more help?  What is smoking withdrawal like? How can I get information on withdrawal? GET READY  Set a quit date.  Change your environment by getting rid of all cigarettes, ashtrays, matches, and lighters in your home, car, or work. Do not let people smoke in your home.  Review your past attempts to quit. Think about what worked and what did not. GET SUPPORT AND ENCOURAGEMENT You have a better chance of being successful if you have help. You can get support in many ways.  Tell your family, friends, and co-workers that you are going to quit and need their support. Ask them not to smoke around you.  Get individual, group, or telephone counseling and support. Programs are available at local hospitals and health centers. Call your local health department for information about programs in your area.  Spiritual beliefs and practices may help some smokers quit.  Download a "quit meter" on your computer to keep track of quit statistics, such as how long you have gone without smoking, cigarettes not smoked, and money saved.  Get a self-help book about quitting smoking and staying off of tobacco. LEARN NEW SKILLS AND BEHAVIORS  Distract yourself from urges to smoke. Talk to someone, go for a walk, or occupy your time with a task.  Change your normal routine. Take a different route to work. Drink tea instead of coffee. Eat breakfast in a different place.  Reduce your stress. Take a hot bath, exercise, or read a book.  Plan something enjoyable to do every day. Reward yourself for not smoking.  Explore  interactive web-based programs that specialize in helping you quit. GET MEDICINE AND USE IT CORRECTLY Medicines can help you stop smoking and decrease the urge to smoke. Combining medicine with the above behavioral methods and support can greatly increase your chances of successfully quitting smoking.  Nicotine replacement therapy helps deliver nicotine to your body without the negative effects and risks of smoking. Nicotine replacement therapy includes nicotine gum, lozenges, inhalers, nasal sprays, and skin patches. Some may be available over-the-counter and others require a prescription.  Antidepressant medicine helps people abstain from smoking, but how this works is unknown. This medicine is available by prescription.    Nicotinic receptor partial agonist medicine simulates the effect of nicotine in your brain. This medicine is available by prescription. Ask your caregiver for advice about which medicines to use and how to use them based on your health history. Your caregiver will tell you what side effects to look out for if you choose to be on a medicine or therapy. Carefully read the information on the package. Do not use any other product containing nicotine while using a nicotine replacement product.  RELAPSE OR DIFFICULT SITUATIONS Most relapses occur within the first 3 months after quitting. Do not be discouraged if you start smoking again. Remember, most people try several times before finally quitting. You may have symptoms of withdrawal because your body is used to nicotine. You may crave cigarettes, be irritable, feel very hungry, cough often, get headaches, or have difficulty concentrating. The withdrawal symptoms are only temporary. They are strongest when you first quit, but they will go away within 10 14 days. To reduce the chances of relapse, try to:  Avoid drinking alcohol. Drinking lowers your chances of successfully quitting.  Reduce the amount of caffeine you consume. Once you  quit smoking, the amount of caffeine in your body increases and can give you symptoms, such as a rapid heartbeat, sweating, and anxiety.  Avoid smokers because they can make you want to smoke.  Do not let weight gain distract you. Many smokers will gain weight when they quit, usually less than 10 pounds. Eat a healthy diet and stay active. You can always lose the weight gained after you quit.  Find ways to improve your mood other than smoking. FOR MORE INFORMATION  www.smokefree.gov  Document Released: 11/29/2001 Document Revised: 06/05/2012 Document Reviewed: 03/15/2012 [employer] Patient Information 2014 [employer].  

## 2013-12-23 NOTE — Addendum Note (Signed)
Addended by: Mena Goes on: 12/23/2013 02:50 PM   Modules accepted: Orders

## 2014-03-28 ENCOUNTER — Encounter: Payer: Self-pay | Admitting: Internal Medicine

## 2014-03-28 ENCOUNTER — Ambulatory Visit (INDEPENDENT_AMBULATORY_CARE_PROVIDER_SITE_OTHER): Payer: Medicare Other | Admitting: Internal Medicine

## 2014-03-28 VITALS — BP 124/70 | HR 59 | Ht 65.75 in | Wt 208.4 lb

## 2014-03-28 DIAGNOSIS — J309 Allergic rhinitis, unspecified: Secondary | ICD-10-CM

## 2014-03-28 DIAGNOSIS — F17200 Nicotine dependence, unspecified, uncomplicated: Secondary | ICD-10-CM

## 2014-03-28 DIAGNOSIS — G4733 Obstructive sleep apnea (adult) (pediatric): Secondary | ICD-10-CM

## 2014-03-28 DIAGNOSIS — J449 Chronic obstructive pulmonary disease, unspecified: Secondary | ICD-10-CM

## 2014-03-28 MED ORDER — AZELASTINE-FLUTICASONE 137-50 MCG/ACT NA SUSP: 1.0000 | Freq: Every day | NASAL | Status: DC

## 2014-03-28 NOTE — Progress Notes (Signed)
Patient ID: Dean [name], male    DOB: 01-Oct-1946, 68 y.o.   MRN: [id number]  HPI 03/29/11- 65 yoM , ongoing smoker despite continued counseling efforts, followed here for COPD, obstructive sleep apnea, lung nodule, allergic rhinitis. Last here September 29, 2010, when PFT reviewed for 2010- FEV1/FVC 0.48 indicating severe COPD. Did well through the winter.  Now Spring pollen season is bothering him with itching eyes, watering eyes and nose.. Ears don't pop or hurt. Chest tightness and morning cough are also worse lately- blaming pollen. Denies discolored sputum, chest pain or palpitation. Actively uncomfortable with GERD- working with Dr Henrene Pastor.   09/28/11- 45 yoM , ongoing smoker despite continued counseling efforts, followed here for COPD, obstructive sleep apnea, lung nodule, allergic rhinitis. GERD is better. 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 Kellie Simmering. During that time he nearly quit cigarettes but admits still smoking about 4 per day. He has a written taper schedule plan for smoking cessation. I strongly encouraged him in this effort. He had no restart toric complications from his surgery. He is very much aware of shortness of breath. Still has daily cough with clear phlegm, no blood and no chest pain. He does complain of some sinus pressure with little nasal discharge. He continues able to use CPAP all night every night and now has a new mask which he likes. He feels this problem is controlled.   03/28/12-  66 yoM , ongoing smoker despite continued counseling efforts, followed here for COPD, obstructive sleep apnea, lung nodule, allergic rhinitis. Activity is limited more by back pain and his breathing. He continues to smoke. Now pollen is bothering him with nasal congestion. Using nasal saline. Admits cough with white sputum. Denies blood, chest pain, palpitation or recent infection. IMPRESSION: CXR- 09/01/11- reviewed with pt Interval decreased interstitial prominence.  Mild  left lung base/retrocardiac opacity is likely scarring or  atelectasis. A mild/early infiltrate is not excluded in the  appropriate clinical setting.  Original Report Authenticated By: Suanne Marker, M.D.   05/04/12-  89 yoM , ongoing smoker despite continued counseling efforts, followed here for COPD, obstructive sleep apnea, lung nodule, allergic rhinitis. He had called wanting to try a neb treatment before deciding if a home neb would work better than his MDI. He continues to smoke against advice.  09/27/12- 30 yoM , ongoing smoker despite continued counseling efforts, followed here for COPD, obstructive sleep apnea, lung nodule, allergic rhinitis. CPAP [facility] 17-  good  Still has SOB at times; breathing has gotten better since last visit. No acute events and less cough and wheeze as he has decreased smoking. Chronic back pain limits activity. COPD assessment test (CABG) score 12/40 CXR 09/01/11- reviewed with him IMPRESSION:  Interval decreased interstitial prominence.  Mild left lung base/retrocardiac opacity is likely scarring or  atelectasis. A mild/early infiltrate is not excluded in the  appropriate clinical setting.  Original Report Authenticated By: Suanne Marker, M.D.   03/28/13- 67 yoM , ongoing smoker despite continued counseling efforts, followed here for COPD, obstructive sleep apnea, lung nodule, allergic rhinitis. CPAP 17/ [facility] FOLLOWS FOR:SOB worse with activity, wheezing, cough and congestion-productive-clear in color. Denies any fever or chills. Still smoking. We discussed support again. Tree pollen is bothering him some with nasal congestion and light cough. We discussed lisinopril as a possible cause of cough which could be checked by changing to an ARB for trial.  09/27/09-67 yoM, ongoing smoker despite continued counseling efforts, followed  here for COPD, obstructive sleep apnea, lung nodule, allergic rhinitis FOLLOWS FOR: dong pretty good overall; has  good and bad days with SOB and wheezing. CPAP 17/ [name] We discussed his smoking again. Occasional shortness of breath with cough. Came back from visiting [place] with a bronchitis in July. Treated by Dr. Reynaldo Minium with chest x-ray. Compliant with CPAP but still needs occasional nap. Back pain affects sleep quality. CXR 09/27/12 IMPRESSION:  No evidence of acute cardiopulmonary disease.  Original Report Authenticated By: Julian Hy, M.D.  03/28/14- 67 yoM, ongoing smoker despite continued counseling efforts, followed here for COPD, obstructive sleep apnea, lung nodule, allergic rhinitis FOLLOWS FOR: reveiw titration report with patient; Wears CPAP 17/ [employer] every night and at nap time. Pt states allergies have kicked and now causing SOB and wheezing. Continues to smoke against counseling. Download showed great compliance and suggested he could use pressure at 18. He agrees to increase. Spring pollen much worse this year-Dymista helps some  ROS-see HPI Constitutional:   No-   weight loss, night sweats, fevers, chills, fatigue, lassitude. HEENT:   No-  headaches, difficulty swallowing, tooth/dental problems, sore throat,       No-  sneezing, itching, ear ache, +nasal congestion, post nasal drip,  CV:  No-   chest pain, orthopnea, PND, swelling in lower extremities, anasarca,  dizziness, palpitations Resp: + shortness of breath with exertion or at rest.              No-   productive cough,  + non-productive cough,  No- coughing up of blood.              No-   change in color of mucus.  + wheezing.   Skin: No-   rash or lesions. GI:  No-   heartburn, indigestion, abdominal pain, nausea, vomiting,  GU:  MS:  No-   joint pain or swelling.  . Neuro-     nothing unusual Psych:  No- change in mood or affect. No depression or anxiety.  No memory loss.  Objective:  OBJ- Physical Exam General- Alert, Oriented, Affect-appropriate, Distress- none acute. Odor of tobacco Skin- rash-none, lesions-  none, excoriation- none Lymphadenopathy- none Head- atraumatic            Eyes- Gross vision intact, PERRLA, conjunctivae and secretions clear            Ears- Hearing, canals-normal            Nose- Clear, no-Septal dev, mucus, polyps, erosion, perforation             Throat- Mallampati II , mucosa clear , drainage- none, tonsils- atrophic Neck- flexible , trachea midline, no stridor , thyroid nl, carotid no bruit Chest - symmetrical excursion , unlabored           Heart/CV- RRR , no murmur , no gallop  , no rub, nl s1 s2                           - JVD- none , edema- none, stasis changes- none, varices- none           Lung- + diminished, wheeze- none,  cough-none, dullness-none, rub- none           Chest wall-  Abd-  Br/ Gen/ Rectal- Not done, not indicated Extrem- cyanosis- none, clubbing, none, atrophy- none, strength- nl Neuro- grossly intact to observation

## 2014-03-28 NOTE — Patient Instructions (Signed)
Ok to continue Dymista   1-2 puffs each nostril once daily at bedtime. You can increase this to twice daily for awhile if needed.  Ok to add an otc antihistamine like Claritin/ loratadine, Allegra/ fexofenadine, Zyrtec/ cetirizine, on a day-by-day basis, as needed.  Order- DME [facility] increase CPAP to 18 cwp      Dx OSA

## 2014-04-27 NOTE — Assessment & Plan Note (Signed)
Seasonal exacerbation Plan-continue Dymista, add Claritin

## 2014-04-27 NOTE — Assessment & Plan Note (Signed)
Counseled again, emphasizing medical importance of stopping and available support

## 2014-04-27 NOTE — Assessment & Plan Note (Signed)
Good compliance Plan-increase CPAP to 18

## 2014-04-27 NOTE — Assessment & Plan Note (Signed)
Emphasis on smoking cessation he does not have much clinical bronchitis

## 2014-09-29 ENCOUNTER — Ambulatory Visit (INDEPENDENT_AMBULATORY_CARE_PROVIDER_SITE_OTHER): Payer: Medicare Other | Admitting: Internal Medicine

## 2014-09-29 ENCOUNTER — Ambulatory Visit (INDEPENDENT_AMBULATORY_CARE_PROVIDER_SITE_OTHER)
Admission: RE | Admit: 2014-09-29 | Discharge: 2014-09-29 | Disposition: A | Discharge status: Home / Self Care | Payer: Medicare Other | Source: Ambulatory Visit | Attending: Internal Medicine | Admitting: Internal Medicine

## 2014-09-29 ENCOUNTER — Encounter: Payer: Self-pay | Admitting: Internal Medicine

## 2014-09-29 VITALS — BP 160/62 | HR 53 | Ht 65.5 in | Wt 210.0 lb

## 2014-09-29 DIAGNOSIS — J449 Chronic obstructive pulmonary disease, unspecified: Secondary | ICD-10-CM

## 2014-09-29 DIAGNOSIS — Z23 Encounter for immunization: Secondary | ICD-10-CM

## 2014-09-29 DIAGNOSIS — G4733 Obstructive sleep apnea (adult) (pediatric): Secondary | ICD-10-CM

## 2014-09-29 MED ORDER — AZELASTINE-FLUTICASONE 137-50 MCG/ACT NA SUSP: 1.0000 | Freq: Every day | NASAL | Status: DC

## 2014-09-29 NOTE — Progress Notes (Signed)
Patient ID: Dean [name], male    DOB: Aug 27, 1946, 68 y.o.   MRN: [id number]  HPI 03/29/11- 65 yoM , ongoing smoker despite continued counseling efforts, followed here for COPD, obstructive sleep apnea, lung nodule, allergic rhinitis. Last here September 29, 2010, when PFT reviewed for 2010- FEV1/FVC 0.48 indicating severe COPD. Did well through the winter.  Now Spring pollen season is bothering him with itching eyes, watering eyes and nose.. Ears don't pop or hurt. Chest tightness and morning cough are also worse lately- blaming pollen. Denies discolored sputum, chest pain or palpitation. Actively uncomfortable with GERD- working with Dr Henrene Pastor.   09/28/11- 12 yoM , ongoing smoker despite continued counseling efforts, followed here for COPD, obstructive sleep apnea, lung nodule, allergic rhinitis. GERD is better. 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 Kellie Simmering. During that time he nearly quit cigarettes but admits still smoking about 4 per day. He has a written taper schedule plan for smoking cessation. I strongly encouraged him in this effort. He had no restart toric complications from his surgery. He is very much aware of shortness of breath. Still has daily cough with clear phlegm, no blood and no chest pain. He does complain of some sinus pressure with little nasal discharge. He continues able to use CPAP all night every night and now has a new mask which he likes. He feels this problem is controlled.   03/28/12-  34 yoM , ongoing smoker despite continued counseling efforts, followed here for COPD, obstructive sleep apnea, lung nodule, allergic rhinitis. Activity is limited more by back pain and his breathing. He continues to smoke. Now pollen is bothering him with nasal congestion. Using nasal saline. Admits cough with white sputum. Denies blood, chest pain, palpitation or recent infection. IMPRESSION: CXR- 09/01/11- reviewed with pt Interval decreased interstitial prominence.  Mild  left lung base/retrocardiac opacity is likely scarring or  atelectasis. A mild/early infiltrate is not excluded in the  appropriate clinical setting.  Original Report Authenticated By: Suanne Marker, M.D.   05/04/12-  78 yoM , ongoing smoker despite continued counseling efforts, followed here for COPD, obstructive sleep apnea, lung nodule, allergic rhinitis. He had called wanting to try a neb treatment before deciding if a home neb would work better than his MDI. He continues to smoke against advice.  09/27/12- [name] , ongoing smoker despite continued counseling efforts, followed here for COPD, obstructive sleep apnea, lung nodule, allergic rhinitis. CPAP [employer] 17-  good  Still has SOB at times; breathing has gotten better since last visit. No acute events and less cough and wheeze as he has decreased smoking. Chronic back pain limits activity. COPD assessment test (CABG) score 12/40 CXR 09/01/11- reviewed with him IMPRESSION:  Interval decreased interstitial prominence.  Mild left lung base/retrocardiac opacity is likely scarring or  atelectasis. A mild/early infiltrate is not excluded in the  appropriate clinical setting.  Original Report Authenticated By: Suanne Marker, M.D.   03/28/13- 67 yoM , ongoing smoker despite continued counseling efforts, followed here for COPD, obstructive sleep apnea, lung nodule, allergic rhinitis. CPAP 17/ [employer] FOLLOWS FOR:SOB worse with activity, wheezing, cough and congestion-productive-clear in color. Denies any fever or chills. Still smoking. We discussed support again. Tree pollen is bothering him some with nasal congestion and light cough. We discussed lisinopril as a possible cause of cough which could be checked by changing to an ARB for trial.  09/27/09-67 yoM, ongoing smoker despite continued counseling efforts, followed  here for COPD, obstructive sleep apnea, lung nodule, allergic rhinitis FOLLOWS FOR: dong pretty good overall; has  good and bad days with SOB and wheezing. CPAP 17/ [name] We discussed his smoking again. Occasional shortness of breath with cough. Came back from visiting [place] with a bronchitis in July. Treated by Dr. Reynaldo Minium with chest x-ray. Compliant with CPAP but still needs occasional nap. Back pain affects sleep quality. CXR 09/27/12 IMPRESSION:  No evidence of acute cardiopulmonary disease.  Original Report Authenticated By: Julian Hy, M.D.  03/28/14- 67 yoM, ongoing smoker despite continued counseling efforts, followed here for COPD, obstructive sleep apnea, lung nodule, allergic rhinitis FOLLOWS FOR: reveiw titration report with patient; Wears CPAP 17/ [employer] every night and at nap time. Pt states allergies have kicked and now causing SOB and wheezing. Continues to smoke against counseling. Download showed great compliance and suggested he could use pressure at 18. He agrees to increase. Spring pollen much worse this year-Dymista helps some  09/29/14- 67 yoM, ongoing smoker despite continued counseling efforts, followed here for COPD, obstructive sleep apnea, lung nodule, allergic rhinitis FOLLOW UP:  [facility]; wears CPAP/ 18 [employer] every night ; Wants Rx for new mask    ROS-see HPI Constitutional:   No-   weight loss, night sweats, fevers, chills, fatigue, lassitude. HEENT:   No-  headaches, difficulty swallowing, tooth/dental problems, sore throat,       No-  sneezing, itching, ear ache, +nasal congestion, post nasal drip,  CV:  No-   chest pain, orthopnea, PND, swelling in lower extremities, anasarca,  dizziness, palpitations Resp: + shortness of breath with exertion or at rest.              No-   productive cough,  + non-productive cough,  No- coughing up of blood.              No-   change in color of mucus.  + wheezing.   Skin: No-   rash or lesions. GI:  No-   heartburn, indigestion, abdominal pain, nausea, vomiting,  GU:  MS:  No-   joint pain or swelling.  . Neuro-     nothing  unusual Psych:  No- change in mood or affect. No depression or anxiety.  No memory loss.  Objective:  OBJ- Physical Exam General- Alert, Oriented, Affect-appropriate, Distress- none acute. Odor of tobacco Skin- rash-none, lesions- none, excoriation- none Lymphadenopathy- none Head- atraumatic            Eyes- Gross vision intact, PERRLA, conjunctivae and secretions clear            Ears- Hearing, canals-normal            Nose- Clear, no-Septal dev, mucus, polyps, erosion, perforation             Throat- Mallampati II , mucosa clear , drainage- none, tonsils- atrophic Neck- flexible , trachea midline, no stridor , thyroid nl, carotid no bruit Chest - symmetrical excursion , unlabored           Heart/CV- RRR , no murmur , no gallop  , no rub, nl s1 s2                           - JVD- none , edema- none, stasis changes- none, varices- none           Lung- + diminished, wheeze- none,  cough-none, dullness-none, rub- none  Chest wall-  Abd-  Br/ Gen/ Rectal- Not done, not indicated Extrem- cyanosis- none, clubbing, none, atrophy- none, strength- nl Neuro- grossly intact to observation

## 2014-09-29 NOTE — Patient Instructions (Addendum)
Prescription for CPAP mask of choice and supplies    Dx [facility]  Flu vax  Order CXR -- Dx COPD with chronic bronchitis

## 2014-10-08 NOTE — Progress Notes (Signed)
Quick Note:  Pt aware of results and recs. Nothing further needed. ______

## 2014-11-10 ENCOUNTER — Telehealth: Payer: Self-pay | Admitting: Internal Medicine

## 2014-11-10 NOTE — Telephone Encounter (Signed)
Records received from [facility] for patient appointment with Dr Debara Pickett on 12/25/14.  Records given to [facility] (medical records) for Dr Lysbeth Penner schedule on 12/25/14.  lp

## 2014-12-01 ENCOUNTER — Ambulatory Visit: Payer: Medicare Other | Admitting: Family

## 2014-12-01 ENCOUNTER — Ambulatory Visit (INDEPENDENT_AMBULATORY_CARE_PROVIDER_SITE_OTHER)
Admission: RE | Admit: 2014-12-01 | Discharge: 2014-12-01 | Disposition: A | Discharge status: Home / Self Care | Payer: Medicare Other | Source: Ambulatory Visit | Attending: Family | Admitting: Family

## 2014-12-01 ENCOUNTER — Ambulatory Visit (HOSPITAL_COMMUNITY)
Admission: RE | Admit: 2014-12-01 | Discharge: 2014-12-01 | Disposition: A | Discharge status: Home / Self Care | Payer: Medicare Other | Source: Ambulatory Visit | Attending: Surgery | Admitting: Surgery

## 2014-12-01 DIAGNOSIS — I7092 Chronic total occlusion of artery of the extremities: Secondary | ICD-10-CM

## 2014-12-01 DIAGNOSIS — I10 Essential (primary) hypertension: Secondary | ICD-10-CM | POA: Diagnosis not present

## 2014-12-01 DIAGNOSIS — F17210 Nicotine dependence, cigarettes, uncomplicated: Secondary | ICD-10-CM | POA: Diagnosis not present

## 2014-12-01 DIAGNOSIS — I739 Peripheral vascular disease, unspecified: Secondary | ICD-10-CM | POA: Insufficient documentation

## 2014-12-01 DIAGNOSIS — E785 Hyperlipidemia, unspecified: Secondary | ICD-10-CM | POA: Diagnosis not present

## 2014-12-01 DIAGNOSIS — Z48812 Encounter for surgical aftercare following surgery on the circulatory system: Secondary | ICD-10-CM

## 2014-12-08 ENCOUNTER — Encounter: Payer: Self-pay | Admitting: Family

## 2014-12-09 ENCOUNTER — Ambulatory Visit (INDEPENDENT_AMBULATORY_CARE_PROVIDER_SITE_OTHER): Payer: Medicare Other | Admitting: Family

## 2014-12-09 ENCOUNTER — Encounter: Payer: Self-pay | Admitting: Family

## 2014-12-09 VITALS — BP 161/69 | HR 61 | Resp 16 | Ht 65.5 in | Wt 213.0 lb

## 2014-12-09 DIAGNOSIS — Z95828 Presence of other vascular implants and grafts: Secondary | ICD-10-CM

## 2014-12-09 DIAGNOSIS — F17200 Nicotine dependence, unspecified, uncomplicated: Secondary | ICD-10-CM

## 2014-12-09 DIAGNOSIS — Z9889 Other specified postprocedural states: Secondary | ICD-10-CM

## 2014-12-09 DIAGNOSIS — Z720 Tobacco use: Secondary | ICD-10-CM

## 2014-12-09 DIAGNOSIS — I739 Peripheral vascular disease, unspecified: Secondary | ICD-10-CM

## 2014-12-09 NOTE — Patient Instructions (Signed)
Peripheral Vascular Disease Peripheral Vascular Disease (PVD), also called Peripheral Arterial Disease (PAD), is a circulation problem caused by cholesterol (atherosclerotic plaque) deposits in the arteries. PVD commonly occurs in the lower extremities (legs) but it can occur in other areas of the body, such as your arms. The cholesterol buildup in the arteries reduces blood flow which can cause pain and other serious problems. The presence of PVD can place a person at risk for Coronary Artery Disease (CAD).  CAUSES  Causes of PVD can be many. It is usually associated with more than one risk factor such as:   High Cholesterol.  Smoking.  Diabetes.  Lack of exercise or inactivity.  High blood pressure (hypertension).  Obesity.  Family history. SYMPTOMS   When the lower extremities are affected, patients with PVD may experience:  Leg pain with exertion or physical activity. This is called INTERMITTENT CLAUDICATION. This may present as cramping or numbness with physical activity. The location of the pain is associated with the level of blockage. For example, blockage at the abdominal level (distal abdominal aorta) may result in buttock or hip pain. Lower leg arterial blockage may result in calf pain.  As PVD becomes more severe, pain can develop with less physical activity.  In people with severe PVD, leg pain may occur at rest.  Other PVD signs and symptoms:  Leg numbness or weakness.  Coldness in the affected leg or foot, especially when compared to the other leg.  A change in leg color.  Patients with significant PVD are more prone to ulcers or sores on toes, feet or legs. These may take longer to heal or may reoccur. The ulcers or sores can become infected.  If signs and symptoms of PVD are ignored, gangrene may occur. This can result in the loss of toes or loss of an entire limb.  Not all leg pain is related to PVD. Other medical conditions can cause leg pain such  as:  Blood clots (embolism) or Deep Vein Thrombosis.  Inflammation of the blood vessels (vasculitis).  Spinal stenosis. DIAGNOSIS  Diagnosis of PVD can involve several different types of tests. These can include:  Pulse Volume Recording Method (PVR). This test is simple, painless and does not involve the use of X-rays. PVR involves measuring and comparing the blood pressure in the arms and legs. An ABI (Ankle-Brachial Index) is calculated. The normal ratio of blood pressures is 1. As this number becomes smaller, it indicates more severe disease.  < 0.95 - indicates significant narrowing in one or more leg vessels.  <0.8 - there will usually be pain in the foot, leg or buttock with exercise.  <0.4 - will usually have pain in the legs at rest.  <0.25 - usually indicates limb threatening PVD.  Doppler detection of pulses in the legs. This test is painless and checks to see if you have a pulses in your legs/feet.  A dye or contrast material (a substance that highlights the blood vessels so they show up on x-ray) may be given to help your caregiver better see the arteries for the following tests. The dye is eliminated from your body by the kidney's. Your caregiver may order blood work to check your kidney function and other laboratory values before the following tests are performed:  Magnetic Resonance Angiography (MRA). An MRA is a picture study of the blood vessels and arteries. The MRA machine uses a large magnet to produce images of the blood vessels.  Computed Tomography Angiography (CTA). A CTA   is a specialized x-ray that looks at how the blood flows in your blood vessels. An IV may be inserted into your arm so contrast dye can be injected.  Angiogram. Is a procedure that uses x-rays to look at your blood vessels. This procedure is minimally invasive, meaning a small incision (cut) is made in your groin. A small tube (catheter) is then inserted into the artery of your groin. The catheter  is guided to the blood vessel or artery your caregiver wants to examine. Contrast dye is injected into the catheter. X-rays are then taken of the blood vessel or artery. After the images are obtained, the catheter is taken out. TREATMENT  Treatment of PVD involves many interventions which may include:  Lifestyle changes:  Quitting smoking.  Exercise.  Following a low fat, low cholesterol diet.  Control of diabetes.  Foot care is very important to the PVD patient. Good foot care can help prevent infection.  Medication:  Cholesterol-lowering medicine.  Blood pressure medicine.  Anti-platelet drugs.  Certain medicines may reduce symptoms of Intermittent Claudication.  Interventional/Surgical options:  Angioplasty. An Angioplasty is a procedure that inflates a balloon in the blocked artery. This opens the blocked artery to improve blood flow.  Stent Implant. A wire mesh tube (stent) is placed in the artery. The stent expands and stays in place, allowing the artery to remain open.  Peripheral Bypass Surgery. This is a surgical procedure that reroutes the blood around a blocked artery to help improve blood flow. This type of procedure may be performed if Angioplasty or stent implants are not an option. SEEK IMMEDIATE MEDICAL CARE IF:   You develop pain or numbness in your arms or legs.  Your arm or leg turns cold, becomes blue in color.  You develop redness, warmth, swelling and pain in your arms or legs. MAKE SURE YOU:   Understand these instructions.  Will watch your condition.  Will get help right away if you are not doing well or get worse. Document Released: 01/12/2005 Document Revised: 02/27/2012 Document Reviewed: 12/09/2008 [employer] Patient Information 2015 [employer]. This information is not intended to replace advice given to you by your health care provider. Make sure you discuss any questions you have with your health care provider.    Smoking  Cessation Quitting smoking is important to your health and has many advantages. However, it is not always easy to quit since nicotine is a very addictive drug. Oftentimes, people try 3 times or more before being able to quit. This document explains the best ways for you to prepare to quit smoking. Quitting takes hard work and a lot of effort, but you can do it. ADVANTAGES OF QUITTING SMOKING  You will live longer, feel better, and live better.  Your body will feel the impact of quitting smoking almost immediately.  Within 20 minutes, blood pressure decreases. Your pulse returns to its normal level.  After 8 hours, carbon monoxide levels in the blood return to normal. Your oxygen level increases.  After 24 hours, the chance of having a heart attack starts to decrease. Your breath, hair, and body stop smelling like smoke.  After 48 hours, damaged nerve endings begin to recover. Your sense of taste and smell improve.  After 72 hours, the body is virtually free of nicotine. Your bronchial tubes relax and breathing becomes easier.  After 2 to 12 weeks, lungs can hold more air. Exercise becomes easier and circulation improves.  The risk of having a heart attack, stroke,   cancer, or lung disease is greatly reduced.  After 1 year, the risk of coronary heart disease is cut in half.  After 5 years, the risk of stroke falls to the same as a nonsmoker.  After 10 years, the risk of lung cancer is cut in half and the risk of other cancers decreases significantly.  After 15 years, the risk of coronary heart disease drops, usually to the level of a nonsmoker.  If you are pregnant, quitting smoking will improve your chances of having a healthy baby.  The people you live with, especially any children, will be healthier.  You will have extra money to spend on things other than cigarettes. QUESTIONS TO THINK ABOUT BEFORE ATTEMPTING TO QUIT You may want to talk about your answers with your health care  provider.  Why do you want to quit?  If you tried to quit in the past, what helped and what did not?  What will be the most difficult situations for you after you quit? How will you plan to handle them?  Who can help you through the tough times? Your family? Friends? A health care provider?  What pleasures do you get from smoking? What ways can you still get pleasure if you quit? Here are some questions to ask your health care provider:  How can you help me to be successful at quitting?  What medicine do you think would be best for me and how should I take it?  What should I do if I need more help?  What is smoking withdrawal like? How can I get information on withdrawal? GET READY  Set a quit date.  Change your environment by getting rid of all cigarettes, ashtrays, matches, and lighters in your home, car, or work. Do not let people smoke in your home.  Review your past attempts to quit. Think about what worked and what did not. GET SUPPORT AND ENCOURAGEMENT You have a better chance of being successful if you have help. You can get support in many ways.  Tell your family, friends, and coworkers that you are going to quit and need their support. Ask them not to smoke around you.  Get individual, group, or telephone counseling and support. Programs are available at local hospitals and health centers. Call your local health department for information about programs in your area.  Spiritual beliefs and practices may help some smokers quit.  Download a "quit meter" on your computer to keep track of quit statistics, such as how long you have gone without smoking, cigarettes not smoked, and money saved.  Get a self-help book about quitting smoking and staying off tobacco. LEARN NEW SKILLS AND BEHAVIORS  Distract yourself from urges to smoke. Talk to someone, go for a walk, or occupy your time with a task.  Change your normal routine. Take a different route to work. Drink tea  instead of coffee. Eat breakfast in a different place.  Reduce your stress. Take a hot bath, exercise, or read a book.  Plan something enjoyable to do every day. Reward yourself for not smoking.  Explore interactive web-based programs that specialize in helping you quit. GET MEDICINE AND USE IT CORRECTLY Medicines can help you stop smoking and decrease the urge to smoke. Combining medicine with the above behavioral methods and support can greatly increase your chances of successfully quitting smoking.  Nicotine replacement therapy helps deliver nicotine to your body without the negative effects and risks of smoking. Nicotine replacement therapy includes nicotine gum, lozenges,   inhalers, nasal sprays, and skin patches. Some may be available over-the-counter and others require a prescription.  Antidepressant medicine helps people abstain from smoking, but how this works is unknown. This medicine is available by prescription.  Nicotinic receptor partial agonist medicine simulates the effect of nicotine in your brain. This medicine is available by prescription. Ask your health care provider for advice about which medicines to use and how to use them based on your health history. Your health care provider will tell you what side effects to look out for if you choose to be on a medicine or therapy. Carefully read the information on the package. Do not use any other product containing nicotine while using a nicotine replacement product.  RELAPSE OR DIFFICULT SITUATIONS Most relapses occur within the first 3 months after quitting. Do not be discouraged if you start smoking again. Remember, most people try several times before finally quitting. You may have symptoms of withdrawal because your body is used to nicotine. You may crave cigarettes, be irritable, feel very hungry, cough often, get headaches, or have difficulty concentrating. The withdrawal symptoms are only temporary. They are strongest when you  first quit, but they will go away within 10-14 days. To reduce the chances of relapse, try to:  Avoid drinking alcohol. Drinking lowers your chances of successfully quitting.  Reduce the amount of caffeine you consume. Once you quit smoking, the amount of caffeine in your body increases and can give you symptoms, such as a rapid heartbeat, sweating, and anxiety.  Avoid smokers because they can make you want to smoke.  Do not let weight gain distract you. Many smokers will gain weight when they quit, usually less than 10 pounds. Eat a healthy diet and stay active. You can always lose the weight gained after you quit.  Find ways to improve your mood other than smoking. FOR MORE INFORMATION  www.smokefree.gov  Document Released: 11/29/2001 Document Revised: 04/21/2014 Document Reviewed: 03/15/2012 [employer] Patient Information 2015 [employer]. This information is not intended to replace advice given to you by your health care provider. Make sure you discuss any questions you have with your health care provider.    Smoking Cessation, Tips for Success If you are ready to quit smoking, congratulations! You have chosen to help yourself be healthier. Cigarettes bring nicotine, tar, carbon monoxide, and other irritants into your body. Your lungs, heart, and blood vessels will be able to work better without these poisons. There are many different ways to quit smoking. Nicotine gum, nicotine patches, a nicotine inhaler, or nicotine nasal spray can help with physical craving. Hypnosis, support groups, and medicines help break the habit of smoking. WHAT THINGS CAN I DO TO MAKE QUITTING EASIER?  Here are some tips to help you quit for good:  Pick a date when you will quit smoking completely. Tell all of your friends and family about your plan to quit on that date.  Do not try to slowly cut down on the number of cigarettes you are smoking. Pick a quit date and quit smoking completely starting on that  day.  Throw away all cigarettes.   Clean and remove all ashtrays from your home, work, and car.  On a card, write down your reasons for quitting. Carry the card with you and read it when you get the urge to smoke.  Cleanse your body of nicotine. Drink enough water and fluids to keep your urine clear or pale yellow. Do this after quitting to flush the nicotine from   your body.  Learn to predict your moods. Do not let a bad situation be your excuse to have a cigarette. Some situations in your life might tempt you into wanting a cigarette.  Never have "just one" cigarette. It leads to wanting another and another. Remind yourself of your decision to quit.  Change habits associated with smoking. If you smoked while driving or when feeling stressed, try other activities to replace smoking. Stand up when drinking your coffee. Brush your teeth after eating. Sit in a different chair when you read the paper. Avoid alcohol while trying to quit, and try to drink fewer caffeinated beverages. Alcohol and caffeine may urge you to smoke.  Avoid foods and drinks that can trigger a desire to smoke, such as sugary or spicy foods and alcohol.  Ask people who smoke not to smoke around you.  Have something planned to do right after eating or having a cup of coffee. For example, plan to take a walk or exercise.  Try a relaxation exercise to calm you down and decrease your stress. Remember, you may be tense and nervous for the first 2 weeks after you quit, but this will pass.  Find new activities to keep your hands busy. Play with a pen, coin, or rubber band. Doodle or draw things on paper.  Brush your teeth right after eating. This will help cut down on the craving for the taste of tobacco after meals. You can also try mouthwash.   Use oral substitutes in place of cigarettes. Try using lemon drops, carrots, cinnamon sticks, or chewing gum. Keep them handy so they are available when you have the urge to  smoke.  When you have the urge to smoke, try deep breathing.  Designate your home as a nonsmoking area.  If you are a heavy smoker, ask your health care provider about a prescription for nicotine chewing gum. It can ease your withdrawal from nicotine.  Reward yourself. Set aside the cigarette money you save and buy yourself something nice.  Look for support from others. Join a support group or smoking cessation program. Ask someone at home or at work to help you with your plan to quit smoking.  Always ask yourself, "Do I need this cigarette or is this just a reflex?" Tell yourself, "Today, I choose not to smoke," or "I do not want to smoke." You are reminding yourself of your decision to quit.  Do not replace cigarette smoking with electronic cigarettes (commonly called e-cigarettes). The safety of e-cigarettes is unknown, and some may contain harmful chemicals.  If you relapse, do not give up! Plan ahead and think about what you will do the next time you get the urge to smoke. HOW WILL I FEEL WHEN I QUIT SMOKING? You may have symptoms of withdrawal because your body is used to nicotine (the addictive substance in cigarettes). You may crave cigarettes, be irritable, feel very hungry, cough often, get headaches, or have difficulty concentrating. The withdrawal symptoms are only temporary. They are strongest when you first quit but will go away within 10-14 days. When withdrawal symptoms occur, stay in control. Think about your reasons for quitting. Remind yourself that these are signs that your body is healing and getting used to being without cigarettes. Remember that withdrawal symptoms are easier to treat than the major diseases that smoking can cause.  Even after the withdrawal is over, expect periodic urges to smoke. However, these cravings are generally short lived and will go away whether you   smoke or not. Do not smoke! WHAT RESOURCES ARE AVAILABLE TO HELP ME QUIT SMOKING? Your health care  provider can direct you to community resources or hospitals for support, which may include:  Group support.  Education.  Hypnosis.  Therapy. Document Released: 09/02/2004 Document Revised: 04/21/2014 Document Reviewed: 05/23/2013 [employer] Patient Information 2015 [employer]. This information is not intended to replace advice given to you by your health care provider. Make sure you discuss any questions you have with your health care provider.  

## 2014-12-09 NOTE — Progress Notes (Signed)
[facility] [place] HISTORY AND PHYSICAL -PAD  History of Present Illness Dean [name] is a 68 y.o. male patient of Dr. 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 He returns today for routine surveillance.   He does have significant back arthritic lumbar spine problems which apparently are not amenable to surgery. His biggest issue is his back pain. His low back gives out before his legs do. He denies non-healing wounds, denies rest pain. Pt denies any history of stroke or TIA.  Patient reports he will be getting a stress test in January 2016, denies chest pain, known ectopic beats.  Pt Diabetic: No Pt smoker: smoker (1.5 ppd x 46 yrs)  Pt meds include: Statin :Yes ASA: Yes Other anticoagulants/antiplatelets: no    Past Medical History  Diagnosis Date  . Hyperlipidemia   . Hypertension   . Sleep apnea   . CAD (coronary artery disease)     s/p angioplasty and stenting x3 1994  . Peripheral vascular disease   . Allergy     pollen  . Asthma   . Arthritis   . COPD (chronic obstructive pulmonary disease)   . GERD (gastroesophageal reflux disease)   . Kidney stone   . Atrial fibrillation     Social History History  Substance Use Topics  . Smoking status: Current Every Day Smoker -- 0.25 packs/day for 42 years    Types: Cigarettes  . Smokeless tobacco: Never Used     Comment: use e cig as well  . Alcohol Use: 0.0 oz/week     Comment: rarely    Family History Family History  Problem Relation Age of Onset  . Other Father     ETOH  . Diabetes Father   . Hypertension Father   . Kidney cancer Mother   . Cancer Mother     renal  . Hyperlipidemia Mother   . Hypertension Mother   . Cancer Sister     breast  . Hyperlipidemia Sister   . Hypertension Sister   . Hyperlipidemia Brother   . Hypertension Brother     Past Surgical History  Procedure Laterality Date  . Coronary  angioplasty with stent placement  1994  . Lumbar disectomy  2005    L3 - 4 - 5  . Appendectomy    . Umbilical hernia repair    . Tonsillectomy    . Pr vein bypass graft,aorto-fem-pop  08/31/11    Right to Left Fem-Fem  . Cholecystectomy  1990s    Gall Bladder    Allergies  Allergen Reactions  . Penicillins Palpitations    REACTION: rapid pulse    Current Outpatient Prescriptions  Medication Sig Dispense Refill  . albuterol (PROVENTIL HFA) 108 (90 BASE) MCG/ACT inhaler Inhale 1 puff into the lungs 2 (two) times daily.     Marland Kitchen aspirin 325 MG buffered tablet Take 325 mg by mouth daily.      Marland Kitchen atorvastatin (LIPITOR) 10 MG tablet Take 10 mg by mouth daily.      . Azelastine-Fluticasone (DYMISTA) 137-50 MCG/ACT SUSP Place 1-2 puffs into the nose at bedtime. 1 Bottle prn  . Diclofenac Sodium CR 100 MG 24 hr tablet Take 100 mg by mouth as needed.     . diltiazem (CARDIZEM CD) 240 MG 24 hr capsule Take 240 mg by mouth daily.      Marland Kitchen esomeprazole (NEXIUM) 40 MG capsule Take 40 mg by mouth daily before  breakfast.    . finasteride (PROSCAR) 5 MG tablet Take 1 tablet by mouth daily.    . isosorbide mononitrate (IMDUR) 60 MG 24 hr tablet Take 60 mg by mouth daily.      Marland Kitchen lisinopril (PRINIVIL,ZESTRIL) 20 MG tablet Take 20 mg by mouth daily.      . nitroGLYCERIN (NITRODUR - DOSED IN MG/24 HR) 0.4 mg/hr Place 1 patch onto the skin as directed.      . traMADol (ULTRAM) 50 MG tablet Take 50 mg by mouth as needed.     . Vitamin D, Ergocalciferol, (DRISDOL) 50000 UNITS CAPS Take 1 tablet by mouth Once a week.     No current facility-administered medications for this visit.    ROS: See HPI for pertinent positives and negatives.   Physical Examination  Filed Vitals:   12/09/14 1427  BP: 161/69  Pulse: 61  Resp: 16  Height: 5' 5.5" (1.664 m)  Weight: 213 lb (96.616 kg)  SpO2: 89%   Body mass index is 34.89 kg/(m^2).  General: A&O x 3, WDWN, obese male. Gait: normal Eyes:  PERRLA, Pulmonary: CTAB, diminished breath sounds in all fields, without wheezes , rales or rhonchi Cardiac: regular Rythm , without detected murmur     Carotid Bruits Left Right   Negative Negative  Aorta: is not palpable Radial pulses: 2+ palpable and =   VASCULAR EXAM: Extremities without ischemic changes  without Gangrene; without open wounds.     LE Pulses LEFT RIGHT   FEMORAL 1+ palpable 1+ palpable    POPLITEAL not palpable  not palpable   POSTERIOR TIBIAL not palpable  not palpable    DORSALIS PEDIS  ANTERIOR TIBIAL 2+palpable  2+ palpable    Abdomen: soft, NT, no palpable masses. Skin: no rashes, no ulcers. Musculoskeletal: no muscle wasting or atrophy. Neurologic: A&O X 3; Appropriate Affect ; SENSATION: normal; MOTOR FUNCTION: moving all extremities equally, motor strength 5/5 throughout. Speech is fluent/normal. CN 2-12 intact.     Non-Invasive Vascular Imaging: DATE: (12/01/14)  AORTO-ILIAC DUPLEX LIMITED EVALUATION  FEMORAL-FEMORAL BYPASS GRAFT    INDICATION: Follow up femoral to femoral bypass graft    PREVIOUS INTERVENTION(S): Left to right femoral to femoral bypass graft 08/31/2011; Bilateral common iliac stents placed 1994    DUPLEX EXAM:     LOCATION Velocities                (cm/s) Diameter AP                          (cm) Diameter Transverse (cm)  Inflow Artery        138    Anastomosis              64 1.4 1.2  Graft         53    Anastomosis          225 1.0 1.0  Outflow Artery      196       Right  Left  Today's ABI/TBI .79 .82  Previous ABI/TBI    12/23/13  .76 .74    If Ankle Brachial Index (ABI) or Toe Brachial Index (TBI) performed, please see complete report   ADDITIONAL FINDINGS: Arrhythmias noted     IMPRESSION: 1.Patent left to right femoral to femoral bypass graft 2. Unable to visualize iliac artery stents due to body habitus.    Compared to the previous exam:  No change  ASSESSMENT: Dean [name] is a 68 y.o. 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 He does not walk enough to elicit claudication symptoms due to radiculopathy. He has no tissue loss in his LE's. 12/01/14 fem-fem bypass graft arterial Duplex reveals a patent left to right femoral to femoral bypass graft, unable to visualize iliac artery stents due to body habitus. No change from previous Duplex on 12/23/13. ABI's remain stable with mild and moderate arterial occlusive disease. Unfortunately he continues to smoke.  PLAN:  The patient was counseled re smoking cessation and given several free resources re smoking cessation. Graduated walking program and/or daily seated leg exercises as demonstrated and discussed.  I discussed in depth with the patient the nature of atherosclerosis, and emphasized the importance of maximal medical management including strict control of blood pressure, blood glucose, and lipid levels, obtaining regular exercise, and cessation of smoking.  The patient is aware that without maximal medical management the underlying atherosclerotic disease process will progress, limiting the benefit of any interventions.  Based on the patient's vascular studies and examination, pt will return to clinic in 1 year for ABI's and aortoiliac ltd fem-fem BPG Duplex.  The patient was given information about PAD including signs, symptoms, treatment, what symptoms should prompt the patient to seek immediate medical care, and risk reduction measures to take.  Clemon Chambers, RN, MSN, FNP-C [facility] Phone: [phone]  Clinic MD: Kellie Simmering  12/09/2014 2:24 PM

## 2014-12-10 ENCOUNTER — Other Ambulatory Visit: Payer: Self-pay

## 2014-12-10 DIAGNOSIS — Z48812 Encounter for surgical aftercare following surgery on the circulatory system: Secondary | ICD-10-CM

## 2014-12-10 DIAGNOSIS — I739 Peripheral vascular disease, unspecified: Secondary | ICD-10-CM

## 2014-12-23 ENCOUNTER — Ambulatory Visit: Payer: Medicare Other | Admitting: Family

## 2014-12-23 ENCOUNTER — Other Ambulatory Visit (HOSPITAL_COMMUNITY): Payer: Medicare Other

## 2014-12-23 ENCOUNTER — Encounter (HOSPITAL_COMMUNITY): Payer: Medicare Other

## 2014-12-25 ENCOUNTER — Encounter: Payer: Self-pay | Admitting: Internal Medicine

## 2014-12-25 ENCOUNTER — Ambulatory Visit (INDEPENDENT_AMBULATORY_CARE_PROVIDER_SITE_OTHER): Payer: 59 | Admitting: Internal Medicine

## 2014-12-25 VITALS — BP 156/86 | HR 61 | Ht 66.0 in | Wt 213.7 lb

## 2014-12-25 DIAGNOSIS — F17200 Nicotine dependence, unspecified, uncomplicated: Secondary | ICD-10-CM

## 2014-12-25 DIAGNOSIS — I2510 Atherosclerotic heart disease of native coronary artery without angina pectoris: Secondary | ICD-10-CM

## 2014-12-25 DIAGNOSIS — R9431 Abnormal electrocardiogram [ECG] [EKG]: Secondary | ICD-10-CM

## 2014-12-25 DIAGNOSIS — E785 Hyperlipidemia, unspecified: Secondary | ICD-10-CM

## 2014-12-25 DIAGNOSIS — I739 Peripheral vascular disease, unspecified: Secondary | ICD-10-CM

## 2014-12-25 DIAGNOSIS — Z720 Tobacco use: Secondary | ICD-10-CM

## 2014-12-25 DIAGNOSIS — I10 Essential (primary) hypertension: Secondary | ICD-10-CM

## 2014-12-25 DIAGNOSIS — R0602 Shortness of breath: Secondary | ICD-10-CM

## 2014-12-25 DIAGNOSIS — I7092 Chronic total occlusion of artery of the extremities: Secondary | ICD-10-CM

## 2014-12-25 DIAGNOSIS — I493 Ventricular premature depolarization: Secondary | ICD-10-CM

## 2014-12-25 NOTE — Patient Instructions (Signed)
Your physician has requested that you have a lexiscan myoview. For further information please visit [email or web address]. Please follow instruction sheet, as given.  Your physician recommends that you schedule a follow-up appointment after your test.

## 2014-12-25 NOTE — Progress Notes (Signed)
OFFICE NOTE  Chief Complaint:  Abnormal EKG, dyspnea  Primary Care Physician: Geoffery Lyons, MD  HPI:  Dean [name] is a pleasant 69 year old male who is currently referred to me for evaluation of abnormal EKG. Recently he's had some worsening shortness of breath. He also significant back problems and other medical issues. He is a long-standing smoker with a history of hypertension, dyslipidemia and PAD. He recently had a left femoral-femoral bypass for occluded distal aorta. He had a routine physical performed which demonstrated an abnormal EKG done which showed inferior ST depression and PVCs. This is apparently a new finding. He denies any anginal complaints. He is not particularly active due to problems with his back and peripheral arterial disease.  PMHx:  Past Medical History  Diagnosis Date  . Hyperlipidemia   . Hypertension   . Sleep apnea   . CAD (coronary artery disease)     s/p angioplasty and stenting x3 1994  . Peripheral vascular disease   . Allergy     pollen  . Asthma   . Arthritis   . COPD (chronic obstructive pulmonary disease)   . GERD (gastroesophageal reflux disease)   . Kidney stone   . Atrial fibrillation     Past Surgical History  Procedure Laterality Date  . Coronary angioplasty with stent placement  1994  . Lumbar disectomy  2005    L3 - 4 - 5  . Appendectomy    . Umbilical hernia repair    . Tonsillectomy    . Pr vein bypass graft,aorto-fem-pop  08/31/11    Right to Left Fem-Fem  . Cholecystectomy  1990s    Gall Bladder    FAMHx:  Family History  Problem Relation Age of Onset  . Other Father     ETOH  . Diabetes Father   . Hypertension Father   . Kidney cancer Mother   . Cancer Mother     renal  . Hyperlipidemia Mother   . Hypertension Mother   . Cancer Sister     breast  . Hyperlipidemia Sister   . Hypertension Sister   . Hyperlipidemia Brother   . Hypertension Brother     SOCHx:   reports that he has been  smoking Cigarettes.  He has a 70.5 pack-year smoking history. He has never used smokeless tobacco. He reports that he drinks alcohol. He reports that he does not use illicit drugs.  ALLERGIES:  Allergies  Allergen Reactions  . Penicillins Palpitations    REACTION: rapid pulse    ROS: A comprehensive review of systems was negative except for: Respiratory: positive for dyspnea on exertion Gastrointestinal: positive for abdominal pain and reflux symptoms Musculoskeletal: positive for back pain  HOME MEDS: Current Outpatient Prescriptions  Medication Sig Dispense Refill  . albuterol (PROVENTIL HFA) 108 (90 BASE) MCG/ACT inhaler Inhale 1 puff into the lungs 2 (two) times daily.     Marland Kitchen aspirin 325 MG buffered tablet Take 325 mg by mouth daily.      Marland Kitchen atorvastatin (LIPITOR) 10 MG tablet Take 10 mg by mouth daily.      . Azelastine-Fluticasone (DYMISTA) 137-50 MCG/ACT SUSP Place 1-2 puffs into the nose at bedtime. 1 Bottle prn  . Diclofenac Sodium CR 100 MG 24 hr tablet Take 100 mg by mouth as needed.     . diltiazem (CARDIZEM CD) 240 MG 24 hr capsule Take 240 mg by mouth daily.      Marland Kitchen esomeprazole (NEXIUM) 40 MG capsule Take 40 mg  by mouth daily before breakfast.    . finasteride (PROSCAR) 5 MG tablet Take 1 tablet by mouth daily.    . isosorbide mononitrate (IMDUR) 60 MG 24 hr tablet Take 60 mg by mouth daily.      Marland Kitchen lisinopril (PRINIVIL,ZESTRIL) 20 MG tablet Take 20 mg by mouth daily.      . traMADol (ULTRAM) 50 MG tablet Take 50 mg by mouth as needed.     . Vitamin D, Ergocalciferol, (DRISDOL) 50000 UNITS CAPS Take 1 tablet by mouth Once a week.     No current facility-administered medications for this visit.    LABS/IMAGING: No results found for this or any previous visit (from the past 48 hour(s)). No results found.  VITALS: BP 156/86 mmHg  Pulse 61  Ht 5\' 6"  (1.676 m)  Wt 213 lb 11.2 oz (96.934 kg)  BMI 34.51 kg/m2  EXAM: General appearance: alert and no distress Neck: no  carotid bruit and no JVD Lungs: diminished breath sounds bilaterally Heart: regular rate and rhythm and occasional missed beats Abdomen: obese, soft, non-tender Extremities: extremities normal, atraumatic, no cyanosis or edema Pulses: 1-2+ pulses Skin: Skin color, texture, turgor normal. No rashes or lesions Neurologic: Grossly normal Psych: Pleasant  EKG: Sinus rhythm with PACs at 61, inferior and lateral ST and T-wave changes, PVCs  ASSESSMENT: 1. Abnormal EKG with ischemic findings 2. PVCs 3. Peripheral arterial disease status post left femorofemoral bypass 4. Hypertension 5. Dyslipidemia 6. Smoking 7. Obesity  PLAN: 1.   Mr. Shaheen is a number of cardiac risk factors with an abnormal, ischemic EKG. He's recently had some progressive shortness of breath. Difficult for him to ascertain whether this is related to underlying lung disease, back pain in or other medical problems. He does have a history of PAD which is a coronary equivalent. He is on appropriate medications for heart disease except he is not on beta blocker. This may be related to COPD. I would recommend a Lexiscan nuclear stress test to evaluate for ischemia. He is unable to walk on a treadmill. Plan to see her back to discuss results of that stress test in about a month.  Thanks as always for the kind referral.  Pixie Casino, MD, [facility] Attending Cardiologist [facility]  Saory Carriero C 12/25/2014, 2:09 PM

## 2014-12-26 ENCOUNTER — Telehealth (HOSPITAL_COMMUNITY): Payer: Self-pay

## 2014-12-26 NOTE — Telephone Encounter (Signed)
Encounter complete. 

## 2014-12-31 ENCOUNTER — Ambulatory Visit (HOSPITAL_COMMUNITY)
Admission: RE | Admit: 2014-12-31 | Discharge: 2014-12-31 | Disposition: A | Discharge status: Home / Self Care | Payer: Medicare Other | Source: Ambulatory Visit | Attending: Internal Medicine | Admitting: Internal Medicine

## 2014-12-31 DIAGNOSIS — R9431 Abnormal electrocardiogram [ECG] [EKG]: Secondary | ICD-10-CM

## 2014-12-31 DIAGNOSIS — R0602 Shortness of breath: Secondary | ICD-10-CM | POA: Diagnosis not present

## 2014-12-31 DIAGNOSIS — E785 Hyperlipidemia, unspecified: Secondary | ICD-10-CM

## 2014-12-31 DIAGNOSIS — R42 Dizziness and giddiness: Secondary | ICD-10-CM | POA: Insufficient documentation

## 2014-12-31 DIAGNOSIS — E669 Obesity, unspecified: Secondary | ICD-10-CM | POA: Diagnosis not present

## 2014-12-31 DIAGNOSIS — F17200 Nicotine dependence, unspecified, uncomplicated: Secondary | ICD-10-CM

## 2014-12-31 DIAGNOSIS — R5383 Other fatigue: Secondary | ICD-10-CM | POA: Insufficient documentation

## 2014-12-31 DIAGNOSIS — I10 Essential (primary) hypertension: Secondary | ICD-10-CM | POA: Diagnosis not present

## 2014-12-31 DIAGNOSIS — I493 Ventricular premature depolarization: Secondary | ICD-10-CM

## 2014-12-31 DIAGNOSIS — R0609 Other forms of dyspnea: Secondary | ICD-10-CM | POA: Diagnosis not present

## 2014-12-31 DIAGNOSIS — I739 Peripheral vascular disease, unspecified: Secondary | ICD-10-CM

## 2014-12-31 DIAGNOSIS — Z720 Tobacco use: Secondary | ICD-10-CM | POA: Diagnosis not present

## 2014-12-31 MED ORDER — TECHNETIUM TC 99M SESTAMIBI GENERIC - CARDIOLITE
32.0000 | Freq: Once | INTRAVENOUS | Status: AC | PRN
  Administered 2014-12-31: 32 via INTRAVENOUS

## 2014-12-31 MED ORDER — TECHNETIUM TC 99M SESTAMIBI GENERIC - CARDIOLITE
10.9000 | Freq: Once | INTRAVENOUS | Status: AC | PRN
  Administered 2014-12-31: 10.9 via INTRAVENOUS

## 2014-12-31 MED ORDER — REGADENOSON 0.4 MG/5ML IV SOLN
0.4000 mg | Freq: Once | INTRAVENOUS | Status: AC
Start: 2014-12-31 — End: 2014-12-31
  Administered 2014-12-31: 0.4 mg via INTRAVENOUS

## 2014-12-31 NOTE — Procedures (Addendum)
[place] [address] [place] [address] [id number]  Cardiology Nuclear Med Study  Dean [name] is a 69 y.o. male     MRN : [id number]     DOB: 08-09-1946  Procedure Date: 12/31/2014  Nuclear Med Background Indication for Stress Test:  Evaluation for Ischemia, Abnormal EKG and Follow up CAD History:  Asthma, COPD and CAD;PTCA x3-1994;Lung nodule;chronic bronchitis;AFIB; Pt states has bifemoral stents Cardiac Risk Factors: Hypertension, Lipids, Obesity, Smoker and DVT;PAD  Symptoms:  Dizziness, DOE, Fatigue and SOB   Nuclear Pre-Procedure Caffeine/Decaff Intake:  1:00am NPO After: 11AM   IV Site: R Forearm  IV 0.9% NS with Angio Cath:  22g  Chest Size (in):  50"  IV Started by: Rolene Course, RN  Height: 5\' 6"  (1.676 m)  Cup Size: n/a  BMI:  Body mass index is 34.4 kg/(m^2). Weight:  213 lb (96.616 kg)   Tech Comments:  n/a    Nuclear Med Study 1 or 2 day study: 1 day  Stress Test Type:  Brandon  Order Authorizing Provider:  Lyman Bishop, MD   Resting Radionuclide: Technetium 62m Sestamibi  Resting Radionuclide Dose: 10.9 mCi   Stress Radionuclide:  Technetium 31m Sestamibi  Stress Radionuclide Dose: 32.0 mCi           Stress Protocol Rest HR: 55 Stress HR: 72  Rest BP: 165/80 Stress BP: 209/94  Exercise Time (min): n/a METS: n/a          Dose of Adenosine (mg):  n/a Dose of Lexiscan: 0.4 mg  Dose of Atropine (mg): n/a Dose of Dobutamine: n/a mcg/kg/min (at max HR)  Stress Test Technologist: Mellody Memos, CCT Nuclear Technologist: Imagene Riches, CNMT   Rest Procedure:  Myocardial perfusion imaging was performed at rest 45 minutes following the intravenous administration of Technetium 17m Sestamibi. Stress Procedure:  The patient received IV Lexiscan 0.4 mg over 15-seconds.  Technetium 33m Sestamibi injected IV at 30-seconds.  Patient experienced shortness of breath, head pain and was administered  125 mg of Aminophylline IV. There were no significant changes with Lexiscan.  Quantitative spect images were obtained after a 45 minute delay.  Transient Ischemic Dilatation (Normal <1.22):  1.11  QGS EDV:  n/a ml QGS ESV:  n/a ml LV Ejection Fraction: Study not gated  Rest ECG: NSR with inferior ST depression  Stress ECG: No significant change from baseline ECG  QPS Raw Data Images:  Normal; no motion artifact; normal heart/lung ratio. Stress Images:  There is decreased uptake in the apex. Rest Images:  There is decreased uptake in the apex. Subtraction (SDS):  No evidence of ischemia.  Impression Exercise Capacity:  Lexiscan with no exercise. BP Response:  Normal blood pressure response. Clinical Symptoms:  No significant symptoms noted. ECG Impression:  There are scattered PVCs. Comparison with Prior Nuclear Study: No previous nuclear study performed  Overall Impression:  Low risk stress nuclear study with small, mild fixed inferoapical defect, likely artifact or small area of scar. The study was non-gated due to ectopy.  LV Wall Motion:  Non-gated due to ectopy  Pixie Casino, MD, [facility] Board Certified in Nuclear Cardiology Attending Cardiologist [place], MD  12/31/2014 4:37 PM

## 2015-01-13 ENCOUNTER — Encounter: Payer: Self-pay | Admitting: Internal Medicine

## 2015-01-13 ENCOUNTER — Ambulatory Visit (INDEPENDENT_AMBULATORY_CARE_PROVIDER_SITE_OTHER): Payer: 59 | Admitting: Internal Medicine

## 2015-01-13 VITALS — BP 162/86 | HR 64 | Ht 66.0 in | Wt 213.1 lb

## 2015-01-13 DIAGNOSIS — R9431 Abnormal electrocardiogram [ECG] [EKG]: Secondary | ICD-10-CM

## 2015-01-13 DIAGNOSIS — R0602 Shortness of breath: Secondary | ICD-10-CM

## 2015-01-13 DIAGNOSIS — J4489 Other specified chronic obstructive pulmonary disease: Secondary | ICD-10-CM

## 2015-01-13 DIAGNOSIS — J449 Chronic obstructive pulmonary disease, unspecified: Secondary | ICD-10-CM

## 2015-01-13 DIAGNOSIS — I493 Ventricular premature depolarization: Secondary | ICD-10-CM

## 2015-01-13 DIAGNOSIS — I2510 Atherosclerotic heart disease of native coronary artery without angina pectoris: Secondary | ICD-10-CM

## 2015-01-13 MED ORDER — IRBESARTAN-HYDROCHLOROTHIAZIDE 150-12.5 MG PO TABS: 1.0000 | ORAL_TABLET | Freq: Every day | ORAL | Status: DC

## 2015-01-13 NOTE — Progress Notes (Signed)
OFFICE NOTE  Chief Complaint:  Dyspnea, elevated BP, cough  Primary Care Physician: Geoffery Lyons, MD  HPI:  Dean [name] is a pleasant 69 year old male who is currently referred to me for evaluation of abnormal EKG. Recently he's had some worsening shortness of breath. He also significant back problems and other medical issues. He is a long-standing smoker with a history of hypertension, dyslipidemia and PAD. He recently had a left femoral-femoral bypass for occluded distal aorta. He had a routine physical performed which demonstrated an abnormal EKG done which showed inferior ST depression and PVCs. This is apparently a new finding. He denies any anginal complaints. He is not particularly active due to problems with his back and peripheral arterial disease.  I saw Dean [name] back today For follow-up of his stress test. This is negative for ischemia and showed a small fixed defect which is likely artifact, however scar cannot be excluded as it was a non-gated study. This is somewhat reassuring suggesting that shortness of breath may be more pulmonary in nature. He has reported more persistent, productive cough. He also has a dry cough. He's wondering if this could be related to his lisinopril. He's also noted that his blood pressure is not as well-controlled as it had been in the past with recent increases in diastolic pressure.   PMHx:  Past Medical History  Diagnosis Date  . Hyperlipidemia   . Hypertension   . Sleep apnea   . CAD (coronary artery disease)     s/p angioplasty and stenting x3 1994  . Peripheral vascular disease   . Allergy     pollen  . Asthma   . Arthritis   . COPD (chronic obstructive pulmonary disease)   . GERD (gastroesophageal reflux disease)   . Kidney stone   . Atrial fibrillation     Past Surgical History  Procedure Laterality Date  . Coronary angioplasty with stent placement  1994  . Lumbar disectomy  2005    L3 - 4 - 5  . Appendectomy     . Umbilical hernia repair    . Tonsillectomy    . Pr vein bypass graft,aorto-fem-pop  08/31/11    Right to Left Fem-Fem  . Cholecystectomy  1990s    Gall Bladder    FAMHx:  Family History  Problem Relation Age of Onset  . Other Father     ETOH  . Diabetes Father   . Hypertension Father   . Kidney cancer Mother   . Cancer Mother     renal  . Hyperlipidemia Mother   . Hypertension Mother   . Cancer Sister     breast  . Hyperlipidemia Sister   . Hypertension Sister   . Hyperlipidemia Brother   . Hypertension Brother     SOCHx:   reports that he has been smoking Cigarettes.  He has a 70.5 pack-year smoking history. He has never used smokeless tobacco. He reports that he drinks alcohol. He reports that he does not use illicit drugs.  ALLERGIES:  Allergies  Allergen Reactions  . Penicillins Palpitations    REACTION: rapid pulse    ROS: A comprehensive review of systems was negative except for: Respiratory: positive for dyspnea on exertion Gastrointestinal: positive for abdominal pain and reflux symptoms Musculoskeletal: positive for back pain  HOME MEDS: Current Outpatient Prescriptions  Medication Sig Dispense Refill  . albuterol (PROVENTIL HFA) 108 (90 BASE) MCG/ACT inhaler Inhale 1 puff into the lungs 2 (two) times daily.     Marland Kitchen  aspirin 325 MG buffered tablet Take 325 mg by mouth daily.      Marland Kitchen atorvastatin (LIPITOR) 10 MG tablet Take 10 mg by mouth daily.      . Azelastine-Fluticasone (DYMISTA) 137-50 MCG/ACT SUSP Place 1-2 puffs into the nose at bedtime. 1 Bottle prn  . Diclofenac Sodium CR 100 MG 24 hr tablet Take 100 mg by mouth as needed.     . diltiazem (CARDIZEM CD) 240 MG 24 hr capsule Take 240 mg by mouth daily.      Marland Kitchen esomeprazole (NEXIUM) 40 MG capsule Take 40 mg by mouth daily before breakfast.    . finasteride (PROSCAR) 5 MG tablet Take 1 tablet by mouth daily.    . isosorbide mononitrate (IMDUR) 60 MG 24 hr tablet Take 60 mg by mouth daily.      .  traMADol (ULTRAM) 50 MG tablet Take 50 mg by mouth as needed.     . Vitamin D, Ergocalciferol, (DRISDOL) 50000 UNITS CAPS Take 1 tablet by mouth Once a week.    . irbesartan-hydrochlorothiazide (AVALIDE) 150-12.5 MG per tablet Take 1 tablet by mouth daily. 30 tablet 11   No current facility-administered medications for this visit.    LABS/IMAGING: No results found for this or any previous visit (from the past 48 hour(s)). No results found.  VITALS: BP 162/86 mmHg  Pulse 64  Ht 5\' 6"  (1.676 m)  Wt 213 lb 1.6 oz (96.662 kg)  BMI 34.41 kg/m2  EXAM: deferred  EKG: deferred  ASSESSMENT: 1. Abnormal EKG with ischemic findings - negative nuclear stress test  2. PVCs 3. Peripheral arterial disease status post left femorofemoral bypass 4. Hypertension 5. Dyslipidemia 6. Smoking 7. Obesity 8.  COPD-with recent productive cough  PLAN: 1.   Dean [name] had a negative nuclear stress test which is somewhat reassuring. He's not endorsing chest pain but rather worsening shortness of breath and productive cough. This could be a COPD exacerbation. He is also on lisinopril which could be playing a role in some dry cough. He is reporting his blood pressure is not as well-controlled as it could be, therefore I recommend switching his lisinopril to an ARB, namely irbesartan HCTZ 150/12.5 mg daily. We'll plan to have him follow-up with Erasmo Downer our hypertension management pharmacist in 2 weeks at which time he should present his blood pressures for evaluation. I've encouraged him to follow-up with his pulmonologist.   Thanks as always for the kind referral.  Pixie Casino, MD, [facility] Attending Cardiologist [facility]  HILTY,Kenneth C 01/13/2015, 4:33 PM

## 2015-01-13 NOTE — Patient Instructions (Signed)
Your physician has recommended you make the following change in your medication...  1. STOP lisinopril  2. START irbesartan-hydrochlorothiazide 150-12.5mg  daily for BP  Your physician recommends that you schedule a follow-up appointment in 2 weeks with Erasmo Downer (pharmacist) for BP check   Your physician wants you to follow-up in: 1 year with Dr. Debara Pickett. You will receive a reminder letter in the mail two months in advance. If you don't receive a letter, please call our office to schedule the follow-up appointment.

## 2015-01-20 ENCOUNTER — Encounter: Payer: Self-pay | Admitting: Internal Medicine

## 2015-01-28 ENCOUNTER — Ambulatory Visit: Payer: 59 | Admitting: Pharmacist Clinician (PhC)/ Clinical Pharmacy Specialist

## 2015-02-05 ENCOUNTER — Ambulatory Visit (INDEPENDENT_AMBULATORY_CARE_PROVIDER_SITE_OTHER): Payer: Medicare Other | Admitting: Pharmacist

## 2015-02-05 ENCOUNTER — Encounter: Payer: Self-pay | Admitting: Pharmacist

## 2015-02-05 VITALS — BP 151/83 | HR 63 | Wt 211.0 lb

## 2015-02-05 DIAGNOSIS — I10 Essential (primary) hypertension: Secondary | ICD-10-CM

## 2015-02-05 NOTE — Patient Instructions (Signed)
It was nice meeting you today.  Continue taking all of you medicines as directed Start checking your blood pressure at home at least 4 times each week and record results Let's start thinking about setting a quit date for an attempt to stop smoking Email your blood pressure log to me by 03/03/15 ([email or web address]) Follow-up telephone visit with me on 03/04/15 10am

## 2015-02-05 NOTE — Progress Notes (Signed)
Pharmacist Hypertension Clinic - Resident Research Study  S/O: Dean [name] is a 69 y.o. white male presenting to clinic today for initial hypertension clinic visit and possible study enrollment, referred to hypertension clinic by Dr. Debara Pickett.  Pt was recently switched from lisinopril 20mg  daily to irbesartan 150mg  daily d/t cough - unclear if cough was d/t ACEi vs COPD/pulmonary.  Pt reports that cough has improved, but not yet resolved.  He has a pulmonologist who he sees regularly.  Pt also reports that he switched from lisinopril to irbesartan on ~01/29/15, a couple weeks after it was prescribed.  Pt reports several failed attempts at smoking cessation in the past.  He states he could try again but that he's not very hopeful at a successful quit.  He reports trying nicotine patch and gum, chantix, and hypnosis.  He denies any regular exercise d/t long standing back pain.  He reports never adding salt to food.  Current HTN meds: Diltiazem CD 240mg  daily Imdur 60mg  daily Irbesartan/HCTZ 150/12.5mg  daily  FH: Pt reports mother, father, and brother all had/have HTN; otherwise non-contributory for CV disease  SH: Current smoker ~75 pack-yr hx, pt reports rarely drinking alcohol, denies illicit drugs  Wt Readings from Last 3 Encounters:  02/05/15 211 lb (95.709 kg)  01/13/15 213 lb 1.6 oz (96.662 kg)  12/31/14 213 lb (96.616 kg)   BP Readings from Last 3 Encounters:  02/05/15 151/83  01/13/15 162/86  12/25/14 156/86   Pulse Readings from Last 3 Encounters:  02/05/15 63  01/13/15 64  12/25/14 61    Renal function: CrCl ~100 mL/min, lytes ok   Outpatient Encounter Prescriptions as of 02/05/2015  Medication Sig Note  . albuterol (PROVENTIL HFA) 108 (90 BASE) MCG/ACT inhaler Inhale 1 puff into the lungs 2 (two) times daily.    Marland Kitchen aspirin 325 MG buffered tablet Take 325 mg by mouth daily.     Marland Kitchen atorvastatin (LIPITOR) 10 MG tablet Take 10 mg by mouth daily.     .  Azelastine-Fluticasone (DYMISTA) 137-50 MCG/ACT SUSP Place 1-2 puffs into the nose at bedtime.   . Diclofenac Sodium CR 100 MG 24 hr tablet Take 100 mg by mouth as needed.    . diltiazem (CARDIZEM CD) 240 MG 24 hr capsule Take 240 mg by mouth daily.     Marland Kitchen esomeprazole (NEXIUM) 40 MG capsule Take 40 mg by mouth daily before breakfast.   . finasteride (PROSCAR) 5 MG tablet Take 1 tablet by mouth daily.   . irbesartan-hydrochlorothiazide (AVALIDE) 150-12.5 MG per tablet Take 1 tablet by mouth daily.   . isosorbide mononitrate (IMDUR) 60 MG 24 hr tablet Take 60 mg by mouth daily.     . traMADol (ULTRAM) 50 MG tablet Take 50 mg by mouth as needed.  12/09/2014: Received from: [facility]  . Vitamin D, Ergocalciferol, (DRISDOL) 50000 UNITS CAPS Take 1 tablet by mouth Once a week.     Allergies: Allergies  Allergen Reactions  . Penicillins Palpitations    REACTION: rapid pulse    A/P: Pt has qualified for study enrollment and has signed informed consent. Pt was randomized to study group: Home BP and telephone F/U  Pt is not at BP goal of < 140/90 mmHg but it has appears to be slowly improving.  Will not make any med changes today d/t switch from ACEi >> ARB only occurred about a week ago.  Continue all medicines as prescribed Begin checking home BP at least 4x/wk and record results  F/U telephone visit in 63mo  Future visits may consider increasing irbesartan dose (note, highest strength combo product available is irbesartan/HCTZ 300/12.5mg ) if additional BP lowering is needed.  Need to reinforce diet recommendations and continue encouraging him to quit smoking.  Drucie Opitz, PharmD Clinical Pharmacy Resident

## 2015-03-04 ENCOUNTER — Telehealth: Payer: Self-pay | Admitting: Pharmacist

## 2015-03-04 DIAGNOSIS — I10 Essential (primary) hypertension: Secondary | ICD-10-CM

## 2015-03-04 NOTE — Telephone Encounter (Signed)
Pharmacist Hypertension Clinic - Resident Research Study  S/O: Dean [name] is a 69 y.o. white male .  I am calling him today for 4wk HTN follow-up visit, referred to hypertension clinic by Dr. Debara Pickett.  Pt was switched from lisinopril 20mg  daily to irbesartan 150mg  daily 01/2015 d/t cough - unclear if cough was d/t ACEi vs COPD/pulmonary.  Pt reports that his cough has "improved a whole, whole lot" since the med change, but not completely resolved.  He has a pulmonologist who he sees regularly.  Pt reports several failed attempts at smoking cessation in the past. He states that he continuously thinks about quitting, but that smoking is one of his coping mechanisms for living with his chronic back pain. He is not ready to set a quit date, but reports that he would like to be asked about quitting during future visits.  He reports trying and failing nicotine patch + gum, chantix, and hypnosis previously.  He denies any regular exercise d/t long standing back pain.    He reports now being more aware of watching salt content of foods, looking at nutrition labels for sodium content, and avoiding canned foods.  He reports never adding salt to food.  Current HTN meds: Diltiazem CD 240mg  daily Imdur 60mg  daily Irbesartan/HCTZ 150/12.5mg  daily (started 01/29/2014)   FH: Pt reports mother, father, and brother all had/have HTN; otherwise non-contributory for CV disease  SH: Current smoker ~75 pack-yr hx, pt reports rarely drinking alcohol, denies illicit drugs  Wt Readings from Last 3 Encounters:  02/05/15 211 lb (95.709 kg)  01/13/15 213 lb 1.6 oz (96.662 kg)  12/31/14 213 lb (96.616 kg)   BP Readings from Last 3 Encounters:  03/04/15 132/72  02/05/15 151/83  01/13/15 162/86   Pulse Readings from Last 3 Encounters:  02/05/15 63  01/13/15 64  12/25/14 61    Renal function: CrCl ~100 mL/min, lytes ok (labs from 10/31/2014)   Outpatient Encounter Prescriptions as of 03/04/2015   Medication Sig Note  . albuterol (PROVENTIL HFA) 108 (90 BASE) MCG/ACT inhaler Inhale 1 puff into the lungs 2 (two) times daily.    Marland Kitchen aspirin 325 MG buffered tablet Take 325 mg by mouth daily.     Marland Kitchen atorvastatin (LIPITOR) 10 MG tablet Take 10 mg by mouth daily.     . Azelastine-Fluticasone (DYMISTA) 137-50 MCG/ACT SUSP Place 1-2 puffs into the nose at bedtime.   . Diclofenac Sodium CR 100 MG 24 hr tablet Take 100 mg by mouth as needed.    . diltiazem (CARDIZEM CD) 240 MG 24 hr capsule Take 240 mg by mouth daily.     Marland Kitchen esomeprazole (NEXIUM) 40 MG capsule Take 40 mg by mouth daily before breakfast.   . finasteride (PROSCAR) 5 MG tablet Take 1 tablet by mouth daily.   . irbesartan-hydrochlorothiazide (AVALIDE) 150-12.5 MG per tablet Take 1 tablet by mouth daily.   . isosorbide mononitrate (IMDUR) 60 MG 24 hr tablet Take 60 mg by mouth daily.     . traMADol (ULTRAM) 50 MG tablet Take 50 mg by mouth as needed.  12/09/2014: Received from: [facility]  . Vitamin D, Ergocalciferol, (DRISDOL) 50000 UNITS CAPS Take 1 tablet by mouth Once a week.     Allergies: Allergies  Allergen Reactions  . Penicillins Palpitations    REACTION: rapid pulse    A/P: Pt has qualified for study enrollment and has signed informed consent. Pt was randomized to study group: Home BP and telephone F/U  Pt  is now at BP goal of < 140/90 mmHg based on results of home BP log.  Continue all medicines as prescribed Continue checking home BP at least 4x/wk and record results Final study visit in clinic in 8wks  Future visits may consider increasing irbesartan dose (note, highest strength combo product available is irbesartan/HCTZ 300/12.5mg ) if additional BP lowering is needed.  Need to reinforce diet recommendations and continue encouraging him to quit smoking.  Drucie Opitz, PharmD Clinical Pharmacy Resident

## 2015-03-31 ENCOUNTER — Encounter: Payer: Self-pay | Admitting: Internal Medicine

## 2015-03-31 ENCOUNTER — Ambulatory Visit (INDEPENDENT_AMBULATORY_CARE_PROVIDER_SITE_OTHER): Payer: Medicare Other | Admitting: Internal Medicine

## 2015-03-31 VITALS — BP 110/62 | HR 69 | Ht 66.0 in | Wt 218.8 lb

## 2015-03-31 DIAGNOSIS — Z720 Tobacco use: Secondary | ICD-10-CM

## 2015-03-31 DIAGNOSIS — M545 Low back pain, unspecified: Secondary | ICD-10-CM | POA: Insufficient documentation

## 2015-03-31 DIAGNOSIS — J449 Chronic obstructive pulmonary disease, unspecified: Secondary | ICD-10-CM

## 2015-03-31 DIAGNOSIS — F17200 Nicotine dependence, unspecified, uncomplicated: Secondary | ICD-10-CM

## 2015-03-31 DIAGNOSIS — M546 Pain in thoracic spine: Secondary | ICD-10-CM | POA: Insufficient documentation

## 2015-03-31 DIAGNOSIS — G4733 Obstructive sleep apnea (adult) (pediatric): Secondary | ICD-10-CM

## 2015-03-31 MED ORDER — DULOXETINE HCL 30 MG PO CPEP: ORAL_CAPSULE | ORAL | Status: DC

## 2015-03-31 NOTE — Assessment & Plan Note (Signed)
We discussed a trial of Cymbalta for chronic pain since he understands he is inoperable. This may also help chronic situational depression.

## 2015-03-31 NOTE — Assessment & Plan Note (Signed)
Fair control. He cannot exercise due to dyspnea because of his back pain.

## 2015-03-31 NOTE — Assessment & Plan Note (Signed)
Steady supportive pressure on him stop smoking

## 2015-03-31 NOTE — Progress Notes (Signed)
Patient ID: Dean [name], male    DOB: 1946/09/28, 69 y.o.   MRN: [id number]  HPI 03/29/11- 65 yoM , ongoing smoker despite continued counseling efforts, followed here for COPD, obstructive sleep apnea, lung nodule, allergic rhinitis. Last here September 29, 2010, when PFT reviewed for 2010- FEV1/FVC 0.48 indicating severe COPD. Did well through the winter.  Now Spring pollen season is bothering him with itching eyes, watering eyes and nose.. Ears don't pop or hurt. Chest tightness and morning cough are also worse lately- blaming pollen. Denies discolored sputum, chest pain or palpitation. Actively uncomfortable with GERD- working with Dr Henrene Pastor.   09/28/11- 5 yoM , ongoing smoker despite continued counseling efforts, followed here for COPD, obstructive sleep apnea, lung nodule, allergic rhinitis. GERD is better. 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 Kellie Simmering. During that time he nearly quit cigarettes but admits still smoking about 4 per day. He has a written taper schedule plan for smoking cessation. I strongly encouraged him in this effort. He had no restart toric complications from his surgery. He is very much aware of shortness of breath. Still has daily cough with clear phlegm, no blood and no chest pain. He does complain of some sinus pressure with little nasal discharge. He continues able to use CPAP all night every night and now has a new mask which he likes. He feels this problem is controlled.   03/28/12-  61 yoM , ongoing smoker despite continued counseling efforts, followed here for COPD, obstructive sleep apnea, lung nodule, allergic rhinitis. Activity is limited more by back pain and his breathing. He continues to smoke. Now pollen is bothering him with nasal congestion. Using nasal saline. Admits cough with white sputum. Denies blood, chest pain, palpitation or recent infection. IMPRESSION: CXR- 09/01/11- reviewed with pt Interval decreased interstitial prominence.  Mild  left lung base/retrocardiac opacity is likely scarring or  atelectasis. A mild/early infiltrate is not excluded in the  appropriate clinical setting.  Original Report Authenticated By: Suanne Marker, M.D.   05/04/12-  64 yoM , ongoing smoker despite continued counseling efforts, followed here for COPD, obstructive sleep apnea, lung nodule, allergic rhinitis. He had called wanting to try a neb treatment before deciding if a home neb would work better than his MDI. He continues to smoke against advice.  09/27/12- 61 yoM , ongoing smoker despite continued counseling efforts, followed here for COPD, obstructive sleep apnea, lung nodule, allergic rhinitis. CPAP [employer] 17-  good  Still has SOB at times; breathing has gotten better since last visit. No acute events and less cough and wheeze as he has decreased smoking. Chronic back pain limits activity. COPD assessment test (CABG) score 12/40 CXR 09/01/11- reviewed with him IMPRESSION:  Interval decreased interstitial prominence.  Mild left lung base/retrocardiac opacity is likely scarring or  atelectasis. A mild/early infiltrate is not excluded in the  appropriate clinical setting.  Original Report Authenticated By: Suanne Marker, M.D.   03/28/13- 67 yoM , ongoing smoker despite continued counseling efforts, followed here for COPD, obstructive sleep apnea, lung nodule, allergic rhinitis. CPAP 17/ [employer] FOLLOWS FOR:SOB worse with activity, wheezing, cough and congestion-productive-clear in color. Denies any fever or chills. Still smoking. We discussed support again. Tree pollen is bothering him some with nasal congestion and light cough. We discussed lisinopril as a possible cause of cough which could be checked by changing to an ARB for trial.  09/27/09-67 yoM, ongoing smoker despite continued counseling efforts, followed  here for COPD, obstructive sleep apnea, lung nodule, allergic rhinitis FOLLOWS FOR: dong pretty good overall; has  good and bad days with SOB and wheezing. CPAP 17/ [name] We discussed his smoking again. Occasional shortness of breath with cough. Came back from visiting [place] with a bronchitis in July. Treated by Dr. Reynaldo Minium with chest x-ray. Compliant with CPAP but still needs occasional nap. Back pain affects sleep quality. CXR 09/27/12 IMPRESSION:  No evidence of acute cardiopulmonary disease.  Original Report Authenticated By: Julian Hy, M.D.  03/28/14- 67 yoM, ongoing smoker despite continued counseling efforts, followed here for COPD, obstructive sleep apnea, lung nodule, allergic rhinitis FOLLOWS FOR: reveiw titration report with patient; Wears CPAP 17/ [employer] every night and at nap time. Pt states allergies have kicked and now causing SOB and wheezing. Continues to smoke against counseling. Download showed great compliance and suggested he could use pressure at 18. He agrees to increase. Spring pollen much worse this year-Dymista helps some  09/29/14- 67 yoM, ongoing smoker despite continued counseling efforts, followed here for COPD, obstructive sleep apnea, lung nodule, allergic rhinitis CPAP/ 18 [employer] every night ; Wants Rx for new mask  03/31/15- 69 yoM, ongoing smoker despite continued counseling efforts, followed here for COPD, obstructive sleep apnea, lung nodule, allergic rhinitis FOLLOW FOR:  COPD.  Breathing doing okay.  no concerns. CPAP 18/ [employer] usually wears less nasal congestion is really bad Admits feeling somewhat depressed. Very limited by back pain. He has had to hire somebody to walk his dog. Not a surgical candidate. CXR 09/29/14 IMPRESSION: No active disease. Stable hyperinflation and chronic mild bronchitic changes. Electronically Signed  By: Lahoma Crocker M.D.  On: 09/29/2014 15:08  ROS-see HPI Constitutional:   No-   weight loss, night sweats, fevers, chills, fatigue, lassitude. HEENT:   No-  headaches, difficulty swallowing, tooth/dental problems, sore throat,        No-  sneezing, itching, ear ache, +nasal congestion, post nasal drip,  CV:  No-   chest pain, orthopnea, PND, swelling in lower extremities, anasarca,  dizziness, palpitations Resp: + shortness of breath with exertion or at rest.              No-   productive cough,  + non-productive cough,  No- coughing up of blood.              No-   change in color of mucus.  + wheezing.   Skin: No-   rash or lesions. GI:  No-   heartburn, indigestion, abdominal pain, nausea, vomiting,  GU:  MS:  No-   joint pain or swelling, + back pain . Neuro-     nothing unusual Psych:  No- change in mood or affect. No depression or anxiety.  No memory loss.  Objective:  OBJ- Physical Exam General- Alert, Oriented, Affect-appropriate, Distress- none acute. Odor of tobacco Skin- rash-none, lesions- none, excoriation- none Lymphadenopathy- none Head- atraumatic            Eyes- Gross vision intact, PERRLA, conjunctivae and secretions clear            Ears- Hearing, canals-normal            Nose- Clear, no-Septal dev, mucus, polyps, erosion, perforation             Throat- Mallampati II , mucosa clear , drainage- none, tonsils- atrophic Neck- flexible , trachea midline, no stridor , thyroid nl, carotid no bruit Chest - symmetrical excursion , unlabored  Heart/CV- RRR , no murmur , no gallop  , no rub, nl s1 s2                           - JVD- none , edema- none, stasis changes- none, varices- none           Lung- + diminished, wheeze- none,  cough-none, dullness-none, rub- none           Chest wall-  Abd-  Br/ Gen/ Rectal- Not done, not indicated Extrem- cyanosis- none, clubbing, none, atrophy- none, strength- nl Neuro- grossly intact to observation

## 2015-03-31 NOTE — Assessment & Plan Note (Signed)
Good compliance and control generally. Recent nasal congestion is interfering. CPAP does not need changes.

## 2015-03-31 NOTE — Patient Instructions (Addendum)
Script printed for Cymbalta to see if it can help with chronic back pain as discussed. It may interact with tramadol, so don't use tramadol more than you have to.   Ok to use the Dymista in any combination of 1-2 puffs each nostril, once or twice daily  Please don't smoke !

## 2015-04-28 ENCOUNTER — Encounter: Payer: Self-pay | Admitting: Pharmacist

## 2015-04-28 ENCOUNTER — Ambulatory Visit (INDEPENDENT_AMBULATORY_CARE_PROVIDER_SITE_OTHER): Payer: Medicare Other | Admitting: Pharmacist

## 2015-04-28 VITALS — BP 138/70 | HR 65 | Wt 213.2 lb

## 2015-04-28 DIAGNOSIS — I10 Essential (primary) hypertension: Secondary | ICD-10-CM

## 2015-04-28 NOTE — Patient Instructions (Signed)
It has been a pleasure working with you these past few months.  Continue taking all your medicines as prescribed Work on limiting salt intake and incorporating more fresh and frozen veggies Continue thinking about motivators to quit smoking Consider talking about smoking cessation methods - bupropion, nicotine replacement, etc. If your pain seems to improve, work on increasing exercise - stationary bike might be a good option to try Continue care with your primary care doctor and Dr. Debara Pickett

## 2015-04-28 NOTE — Progress Notes (Signed)
Pharmacist Hypertension Clinic - Resident Research Study  S/O: Dean [name] is a 69 y.o. white male presenting today for 12wk HTN follow-up visit, referred to hypertension clinic by Dr. Debara Pickett.  Pt was switched from lisinopril 20mg  daily to irbesartan 150mg  daily 01/2015 d/t cough - unclear if cough was d/t ACEi vs COPD/pulmonary.  Pt reports that his cough has "improved a whole, whole lot" since the med change, but not completely resolved.  He has a pulmonologist who he sees regularly.  He also continues to smoke despite repeated discussions and encouragement to quit smoking.  Pt reports several failed attempts at smoking cessation in the past. He states that he continuously thinks about quitting, but that smoking is one of his coping mechanisms for living with his chronic back pain. He is not ready to set a quit date, but reports that he would like to be asked about quitting during future visits.  He reports trying and failing nicotine patch + gum, chantix, and hypnosis previously.  He denies any regular exercise d/t long standing back pain.  Pt was prescribed Cymbalta last month in an attempt to help w/ his back pain as well as improve his mood.  He discontinued Cymbalta on his own d/t intolerable side effects, but that he has an upcoming appt w/ his PCP to address his back pain and mood.  He reports now being more aware of watching salt content of foods, looking at nutrition labels for sodium content, and avoiding canned foods.  He reports never adding salt to food.  Current HTN meds: Diltiazem CD 240mg  daily Imdur 60mg  daily Irbesartan/HCTZ 150mg /12.5mg  daily   FH: Pt reports mother, father, and brother all had/have HTN; otherwise non-contributory for CV disease  SH: Current smoker ~75 pack-yr hx, pt reports rarely drinking alcohol, denies illicit drugs  Wt Readings from Last 3 Encounters:  04/28/15 213 lb 4 oz (96.73 kg)  03/31/15 218 lb 12.8 oz (99.247 kg)  02/05/15 211 lb (95.709  kg)   BP Readings from Last 3 Encounters:  04/28/15 138/70  03/31/15 110/62  02/05/15 151/83   Pulse Readings from Last 3 Encounters:  04/28/15 65  03/31/15 69  02/05/15 63    Renal function: CrCl ~100 mL/min, lytes ok (labs from 10/31/2014)  Current Outpatient Prescriptions on File Prior to Visit  Medication Sig Dispense Refill  . albuterol (PROVENTIL HFA) 108 (90 BASE) MCG/ACT inhaler Inhale 1 puff into the lungs 2 (two) times daily.     Marland Kitchen aspirin 325 MG buffered tablet Take 325 mg by mouth daily.      Marland Kitchen atorvastatin (LIPITOR) 10 MG tablet Take 10 mg by mouth daily.      . Azelastine-Fluticasone (DYMISTA) 137-50 MCG/ACT SUSP Place 1-2 puffs into the nose at bedtime. 1 Bottle prn  . Diclofenac Sodium CR 100 MG 24 hr tablet Take 100 mg by mouth as needed.     . diltiazem (CARDIZEM CD) 240 MG 24 hr capsule Take 240 mg by mouth daily.      Marland Kitchen esomeprazole (NEXIUM) 40 MG capsule Take 40 mg by mouth daily before breakfast.    . finasteride (PROSCAR) 5 MG tablet Take 1 tablet by mouth daily.    . irbesartan-hydrochlorothiazide (AVALIDE) 150-12.5 MG per tablet Take 1 tablet by mouth daily. 30 tablet 11  . isosorbide mononitrate (IMDUR) 60 MG 24 hr tablet Take 60 mg by mouth daily.      . traMADol (ULTRAM) 50 MG tablet Take 50 mg by mouth as needed.     Marland Kitchen  Vitamin D, Ergocalciferol, (DRISDOL) 50000 UNITS CAPS Take 1 tablet by mouth Once a week.     No current facility-administered medications on file prior to visit.      Allergies: Allergies  Allergen Reactions  . Penicillins Palpitations    REACTION: rapid pulse    A/P: Pt has qualified for study enrollment and has signed informed consent. Pt was randomized to study group: Home BP and telephone F/U  Pt is at BP goal of < 140/90 mmHg.  I had an extensive conversation w/ pt today regarding smoking cessation.  I made several suggestions regarding smoking cessation (Rx, NRT, behavioral) and had discussion w/ him regarding  motivating factors to quit.  He states he will continue to think about setting a quit date.  He would like to address this either w/ his PCP or will schedule f/u visit w/ [place] in pharmacy clinic if he would like to pursue this further.  Continue all medicines as prescribed Increase exercise as back pain is addressed Quit smoking! Continue care with your PCP and cardiologist  Future visits may consider increasing irbesartan dose if additional BP lowering is needed (note, highest strength combo product available is irbesartan/HCTZ 300/12.5mg ).  Need to reinforce diet recommendations and continue encouraging him to quit smoking.  Drucie Opitz, PharmD Clinical Pharmacy Resident

## 2015-04-30 ENCOUNTER — Encounter: Payer: Self-pay | Admitting: Internal Medicine

## 2015-06-15 ENCOUNTER — Other Ambulatory Visit: Payer: Self-pay

## 2015-10-01 ENCOUNTER — Ambulatory Visit: Payer: Medicare Other | Admitting: Internal Medicine

## 2015-11-05 ENCOUNTER — Other Ambulatory Visit: Payer: Self-pay | Admitting: Internal Medicine

## 2015-12-09 ENCOUNTER — Encounter: Payer: Self-pay | Admitting: Family

## 2015-12-10 ENCOUNTER — Ambulatory Visit (INDEPENDENT_AMBULATORY_CARE_PROVIDER_SITE_OTHER)
Admission: RE | Admit: 2015-12-10 | Discharge: 2015-12-10 | Disposition: A | Discharge status: Home / Self Care | Payer: Medicare Other | Source: Ambulatory Visit | Attending: Internal Medicine | Admitting: Internal Medicine

## 2015-12-10 ENCOUNTER — Ambulatory Visit: Payer: Medicare Other | Admitting: Internal Medicine

## 2015-12-10 ENCOUNTER — Encounter: Payer: Self-pay | Admitting: Internal Medicine

## 2015-12-10 VITALS — BP 116/78 | HR 72 | Ht 66.0 in | Wt 206.2 lb

## 2015-12-10 DIAGNOSIS — J449 Chronic obstructive pulmonary disease, unspecified: Secondary | ICD-10-CM

## 2015-12-10 NOTE — Progress Notes (Signed)
Patient ID: TREYSEN OTTUM, male    DOB: 1945-12-22, 69 y.o.   MRN: [id number]  HPI 03/29/11- 65 yoM , ongoing smoker despite continued counseling efforts, followed here for COPD, obstructive sleep apnea, lung nodule, allergic rhinitis. Last here September 29, 2010, when PFT reviewed for 2010- FEV1/FVC 0.48 indicating severe COPD. Did well through the winter.  Now Spring pollen season is bothering him with itching eyes, watering eyes and nose.. Ears don't pop or hurt. Chest tightness and morning cough are also worse lately- blaming pollen. Denies discolored sputum, chest pain or palpitation. Actively uncomfortable with GERD- working with Dr Henrene Pastor.   09/28/11- 73 yoM , ongoing smoker despite continued counseling efforts, followed here for COPD, obstructive sleep apnea, lung nodule, allergic rhinitis. GERD is better. 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 Kellie Simmering. During that time he nearly quit cigarettes but admits still smoking about 4 per day. He has a written taper schedule plan for smoking cessation. I strongly encouraged him in this effort. He had no restart toric complications from his surgery. He is very much aware of shortness of breath. Still has daily cough with clear phlegm, no blood and no chest pain. He does complain of some sinus pressure with little nasal discharge. He continues able to use CPAP all night every night and now has a new mask which he likes. He feels this problem is controlled.   03/28/12-  69 yoM , ongoing smoker despite continued counseling efforts, followed here for COPD, obstructive sleep apnea, lung nodule, allergic rhinitis. Activity is limited more by back pain and his breathing. He continues to smoke. Now pollen is bothering him with nasal congestion. Using nasal saline. Admits cough with white sputum. Denies blood, chest pain, palpitation or recent infection. IMPRESSION: CXR- 09/01/11- reviewed with pt Interval decreased interstitial prominence.  Mild  left lung base/retrocardiac opacity is likely scarring or  atelectasis. A mild/early infiltrate is not excluded in the  appropriate clinical setting.  Original Report Authenticated By: Suanne Marker, M.D.   05/04/12-  50 yoM , ongoing smoker despite continued counseling efforts, followed here for COPD, obstructive sleep apnea, lung nodule, allergic rhinitis. He had called wanting to try a neb treatment before deciding if a home neb would work better than his MDI. He continues to smoke against advice.  09/27/12- 61 yoM , ongoing smoker despite continued counseling efforts, followed here for COPD, obstructive sleep apnea, lung nodule, allergic rhinitis. CPAP [employer] 17-  good  Still has SOB at times; breathing has gotten better since last visit. No acute events and less cough and wheeze as he has decreased smoking. Chronic back pain limits activity. COPD assessment test (CABG) score 12/40 CXR 09/01/11- reviewed with him IMPRESSION:  Interval decreased interstitial prominence.  Mild left lung base/retrocardiac opacity is likely scarring or  atelectasis. A mild/early infiltrate is not excluded in the  appropriate clinical setting.  Original Report Authenticated By: Suanne Marker, M.D.   03/28/13- 67 yoM , ongoing smoker despite continued counseling efforts, followed here for COPD, obstructive sleep apnea, lung nodule, allergic rhinitis. CPAP 17/ [employer] FOLLOWS FOR:SOB worse with activity, wheezing, cough and congestion-productive-clear in color. Denies any fever or chills. Still smoking. We discussed support again. Tree pollen is bothering him some with nasal congestion and light cough. We discussed lisinopril as a possible cause of cough which could be checked by changing to an ARB for trial.  09/27/09-67 yoM, ongoing smoker despite continued counseling efforts, followed  here for COPD, obstructive sleep apnea, lung nodule, allergic rhinitis FOLLOWS FOR: dong pretty good overall; has  good and bad days with SOB and wheezing. CPAP 17/ [name] We discussed his smoking again. Occasional shortness of breath with cough. Came back from visiting [place] with a bronchitis in July. Treated by Dr. Reynaldo Minium with chest x-ray. Compliant with CPAP but still needs occasional nap. Back pain affects sleep quality. CXR 09/27/12 IMPRESSION:  No evidence of acute cardiopulmonary disease.  Original Report Authenticated By: Julian Hy, M.D.  03/28/14- 67 yoM, ongoing smoker despite continued counseling efforts, followed here for COPD, obstructive sleep apnea, lung nodule, allergic rhinitis FOLLOWS FOR: reveiw titration report with patient; Wears CPAP 17/ [employer] every night and at nap time. Pt states allergies have kicked and now causing SOB and wheezing. Continues to smoke against counseling. Download showed great compliance and suggested he could use pressure at 18. He agrees to increase. Spring pollen much worse this year-Dymista helps some  09/29/14- 67 yoM, ongoing smoker despite continued counseling efforts, followed here for COPD, obstructive sleep apnea, lung nodule, allergic rhinitis CPAP/ 18 [employer] every night ; Wants Rx for new mask  03/31/15- 69 yoM, ongoing smoker despite continued counseling efforts, followed here for COPD, obstructive sleep apnea, lung nodule, allergic rhinitis FOLLOW FOR:  COPD.  Breathing doing okay.  no concerns. CPAP 18/ [employer] usually wears less nasal congestion is really bad Admits feeling somewhat depressed. Very limited by back pain. He has had to hire somebody to walk his dog. Not a surgical candidate. CXR 09/29/14 IMPRESSION: No active disease. Stable hyperinflation and chronic mild bronchitic changes. Electronically Signed  By: Lahoma Crocker M.D.  On: 09/29/2014 15:08  12/10/2015-69 year old male smoker followed for COPD, OSA, lung nodule, allergic rhinitis CPAP 18/ [employer] FOLLOWS FOR: No change in breathing since last OV. Wears CPAP nightly x 8hr  and naps during the datytime. Denies problems with mask or pressure.     ROS-see HPI Constitutional:   No-   weight loss, night sweats, fevers, chills, fatigue, lassitude. HEENT:   No-  headaches, difficulty swallowing, tooth/dental problems, sore throat,       No-  sneezing, itching, ear ache, +nasal congestion, post nasal drip,  CV:  No-   chest pain, orthopnea, PND, swelling in lower extremities, anasarca,  dizziness, palpitations Resp: + shortness of breath with exertion or at rest.              No-   productive cough,  + non-productive cough,  No- coughing up of blood.              No-   change in color of mucus.  + wheezing.   Skin: No-   rash or lesions. GI:  No-   heartburn, indigestion, abdominal pain, nausea, vomiting,  GU:  MS:  No-   joint pain or swelling, + back pain . Neuro-     nothing unusual Psych:  No- change in mood or affect. No depression or anxiety.  No memory loss.  Objective:  OBJ- Physical Exam General- Alert, Oriented, Affect-appropriate, Distress- none acute. Odor of tobacco Skin- rash-none, lesions- none, excoriation- none Lymphadenopathy- none Head- atraumatic            Eyes- Gross vision intact, PERRLA, conjunctivae and secretions clear            Ears- Hearing, canals-normal            Nose- Clear, no-Septal dev, mucus, polyps, erosion, perforation  Throat- Mallampati II , mucosa clear , drainage- none, tonsils- atrophic Neck- flexible , trachea midline, no stridor , thyroid nl, carotid no bruit Chest - symmetrical excursion , unlabored           Heart/CV- RRR , no murmur , no gallop  , no rub, nl s1 s2                           - JVD- none , edema- none, stasis changes- none, varices- none           Lung- + diminished, wheeze- none,  cough-none, dullness-none, rub- none           Chest wall-  Abd-  Br/ Gen/ Rectal- Not done, not indicated Extrem- cyanosis- none, clubbing, none, atrophy- none, strength- nl Neuro- grossly intact to  observation

## 2015-12-10 NOTE — Patient Instructions (Signed)
Order- CXR   Dx COPD mixed type  Please call if we can help 

## 2015-12-15 ENCOUNTER — Other Ambulatory Visit (HOSPITAL_COMMUNITY): Payer: Medicare Other

## 2015-12-15 ENCOUNTER — Ambulatory Visit: Payer: Medicare Other | Admitting: Family

## 2015-12-15 ENCOUNTER — Encounter (HOSPITAL_COMMUNITY): Payer: Medicare Other

## 2015-12-16 ENCOUNTER — Ambulatory Visit (HOSPITAL_COMMUNITY)
Admission: RE | Admit: 2015-12-16 | Discharge: 2015-12-16 | Disposition: A | Discharge status: Home / Self Care | Payer: Medicare Other | Source: Ambulatory Visit | Attending: Family | Admitting: Family

## 2015-12-16 ENCOUNTER — Ambulatory Visit (INDEPENDENT_AMBULATORY_CARE_PROVIDER_SITE_OTHER): Payer: Medicare Other | Admitting: Family

## 2015-12-16 ENCOUNTER — Encounter: Payer: Self-pay | Admitting: Family

## 2015-12-16 ENCOUNTER — Ambulatory Visit (INDEPENDENT_AMBULATORY_CARE_PROVIDER_SITE_OTHER)
Admission: RE | Admit: 2015-12-16 | Discharge: 2015-12-16 | Disposition: A | Discharge status: Home / Self Care | Payer: Medicare Other | Source: Ambulatory Visit | Attending: Family | Admitting: Family

## 2015-12-16 VITALS — BP 132/73 | HR 65 | Temp 97.8°F | Resp 16 | Ht 66.0 in | Wt 207.0 lb

## 2015-12-16 DIAGNOSIS — I779 Disorder of arteries and arterioles, unspecified: Secondary | ICD-10-CM | POA: Diagnosis not present

## 2015-12-16 DIAGNOSIS — I10 Essential (primary) hypertension: Secondary | ICD-10-CM | POA: Diagnosis not present

## 2015-12-16 DIAGNOSIS — Z48812 Encounter for surgical aftercare following surgery on the circulatory system: Secondary | ICD-10-CM

## 2015-12-16 DIAGNOSIS — F17200 Nicotine dependence, unspecified, uncomplicated: Secondary | ICD-10-CM

## 2015-12-16 DIAGNOSIS — Z720 Tobacco use: Secondary | ICD-10-CM

## 2015-12-16 DIAGNOSIS — Z95828 Presence of other vascular implants and grafts: Secondary | ICD-10-CM

## 2015-12-16 DIAGNOSIS — Z4889 Encounter for other specified surgical aftercare: Secondary | ICD-10-CM

## 2015-12-16 DIAGNOSIS — E785 Hyperlipidemia, unspecified: Secondary | ICD-10-CM | POA: Insufficient documentation

## 2015-12-16 DIAGNOSIS — I739 Peripheral vascular disease, unspecified: Secondary | ICD-10-CM

## 2015-12-16 NOTE — Patient Instructions (Signed)

## 2015-12-16 NOTE — Addendum Note (Signed)
Addended by: Dorthula Rue L on: 12/16/2015 10:35 AM   Modules accepted: Orders

## 2015-12-16 NOTE — Progress Notes (Signed)
[facility]  HISTORY AND PHYSICAL -PAD  History of Present Illness Dean [name] is a 69 y.o. male patient of Dr. 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 He returns today for routine surveillance.   He does have significant back arthritic lumbar spine problems which apparently are not amenable to surgery. His biggest issue is his back pain. His low back gives out before his legs do. He denies non-healing wounds, denies rest pain. Pt denies any history of stroke or TIA. He reports walking as little as possible as it aggravates his back pain which is worse; this is managed with analgesics that he states he tries not to take.  Patient reports he will be getting a stress test in January 2017, states January 2016 stress test result was "fine". His cardiologist is Dr. Debara Pickett.   Pt Diabetic: No Pt smoker: smoker (1.5 ppd x 46 yrs)  Pt meds include: Statin :Yes ASA: Yes Other anticoagulants/antiplatelets: no     Past Medical History  Diagnosis Date  . Hyperlipidemia   . Hypertension   . Sleep apnea   . CAD (coronary artery disease)     s/p angioplasty and stenting x3 1994  . Peripheral vascular disease ([place])   . Allergy     pollen  . Asthma   . Arthritis   . COPD (chronic obstructive pulmonary disease) ([place])   . GERD (gastroesophageal reflux disease)   . Kidney stone   . Atrial fibrillation [facility])     Social History Social History  Substance Use Topics  . Smoking status: Current Every Day Smoker -- 1.50 packs/day for 47 years    Types: Cigarettes  . Smokeless tobacco: Never Used     Comment: use e cig as well  . Alcohol Use: 0.0 oz/week    0 Standard drinks or equivalent per week     Comment: rarely    Family History Family History  Problem Relation Age of Onset  . Other Father     ETOH  . Diabetes Father   . Hypertension Father   . Kidney cancer Mother   . Cancer Mother    renal  . Hyperlipidemia Mother   . Hypertension Mother   . Cancer Sister     breast  . Hyperlipidemia Sister   . Hypertension Sister   . Hyperlipidemia Brother   . Hypertension Brother     Past Surgical History  Procedure Laterality Date  . Coronary angioplasty with stent placement  1994  . Lumbar disectomy  2005    L3 - 4 - 5  . Appendectomy    . Umbilical hernia repair    . Tonsillectomy    . Pr vein bypass graft,aorto-fem-pop  08/31/11    Right to Left Fem-Fem  . Cholecystectomy  1990s    Gall Bladder    Allergies  Allergen Reactions  . Penicillins Palpitations    REACTION: rapid pulse    Current Outpatient Prescriptions  Medication Sig Dispense Refill  . albuterol (PROVENTIL HFA) 108 (90 BASE) MCG/ACT inhaler Inhale 1 puff into the lungs 2 (two) times daily.     Marland Kitchen aspirin 325 MG buffered tablet Take 325 mg by mouth daily.      Marland Kitchen atorvastatin (LIPITOR) 10 MG tablet Take 10 mg by mouth daily.      . Diclofenac Sodium CR 100 MG 24 hr tablet Take 100 mg by mouth as needed.     Marland Kitchen  diltiazem (CARDIZEM CD) 240 MG 24 hr capsule Take 240 mg by mouth daily.      Marland Kitchen DYMISTA 137-50 MCG/ACT SUSP INSTILL 1 TO 2 PUFFS INTO EACH NOSTRIL AT BEDTIME 23 g PRN  . esomeprazole (NEXIUM) 40 MG capsule Take 40 mg by mouth daily before breakfast.    . finasteride (PROSCAR) 5 MG tablet Take 1 tablet by mouth daily.    . irbesartan-hydrochlorothiazide (AVALIDE) 150-12.5 MG per tablet Take 1 tablet by mouth daily. 30 tablet 11  . isosorbide mononitrate (IMDUR) 60 MG 24 hr tablet Take 60 mg by mouth daily.      . traMADol (ULTRAM) 50 MG tablet Take 50 mg by mouth as needed.     . Vitamin D, Ergocalciferol, (DRISDOL) 50000 UNITS CAPS Take 1 tablet by mouth Once a week.     No current facility-administered medications for this visit.    ROS: See HPI for pertinent positives and negatives.   Physical Examination  Filed Vitals:   12/16/15 0908  BP: 132/73  Pulse: 65  Temp: 97.8 F (36.6 C)   TempSrc: Oral  Resp: 16  Height: 5\' 6"  (1.676 m)  Weight: 207 lb (93.895 kg)  SpO2: 91%   Body mass index is 33.43 kg/(m^2).  General: A&O x 3, WDWN, obese male. Gait: normal Eyes: PERRLA, Pulmonary: CTAB, diminished breath sounds in all fields, without wheezes , rales or rhonchi Cardiac: regular rhythm, no detected murmur     Carotid Bruits Left Right   Negative Negative  Aorta: is not palpable Radial pulses: 2+ palpable and =   VASCULAR EXAM: Extremities without ischemic changes  without Gangrene; without open wounds. 1+ palpable fem-fem bypass graft     LE Pulses LEFT RIGHT   FEMORAL Not  Palpable(obese) not palpable (obese)    POPLITEAL not palpable  not palpable   POSTERIOR TIBIAL not palpable  not palpable    DORSALIS PEDIS  ANTERIOR TIBIAL 1+palpable  not palpable    Abdomen: soft, NT, no palpable masses, large panus. Skin: no rashes, no ulcers. Musculoskeletal: no muscle wasting or atrophy. Neurologic: A&O X 3; Appropriate Affect ; SENSATION: normal; MOTOR FUNCTION: moving all extremities equally, motor strength 5/5 throughout. Speech is fluent/normal. CN 2-12 intact.           Non-Invasive Vascular Imaging: DATE: 12/16/2015 AORTO-ILIAC DUPLEX LIMITED EVALUATION  FEMORAL-FEMORAL BYPASS GRAFT    INDICATION: Peripheral vascular disease     PREVIOUS INTERVENTION(S): Left to right femoral to femoral bypass graft 08/31/2011 Bilateral common iliac stents placed in 1994.    DUPLEX EXAM:     LOCATION Velocities                (cm/s) Diameter AP                          (cm) Diameter Transverse (cm)  Inflow Artery        146    Anastomosis              36 1.36   Graft         37    Anastomosis          229  1.21   Outflow Artery      185       Right  Left  Today's ABI/TBI 0.66 0.70  Previous ABI/TBI   12/01/2014   0.79 0.82    If Ankle Brachial Index (ABI) or Toe Brachial Index (TBI) performed, please  see complete report   ADDITIONAL FINDINGS: The bilateral common iliac arteries cannot be adequately visualized due to bowel gas and patient body habitus.    IMPRESSION: Patent left to right femoral to femoral graft with elevated velocities noted at the distal anastomosis; however, no appreciable plaque is visualized.    Compared to the previous exam:  There is a decline in the bilateral ankle brachial indices when compared to the previous exam.     ASSESSMENT: Dean [name] is a 69 y.o. 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 He does not walk enough to elicit claudication symptoms due to radiculopathy which has worsened and pt is walking less. He has no tissue loss in his LE's.  Today's fem-fem bypass duplex suggests a patent left to right femoral to femoral graft with elevated velocities noted at the distal anastomosis; however, no appreciable plaque is visualized. The bilateral common iliac arteries cannot be adequately visualized due to bowel gas and patient body habitus. There is a decline in the bilateral ankle brachial indices when compared to the previous exam. 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  PLAN:  The patient was counseled re smoking cessation and given several free resources re smoking cessation. Daily seated leg exercises demonstrated and discussed.   Based on the patient's vascular studies and examination, pt will return to clinic in 1 year with fem-fem bypass duplex and ABI's.  I discussed in depth with the patient the nature of atherosclerosis, and emphasized the importance of maximal medical management including strict control of blood pressure, blood glucose, and lipid levels, obtaining regular  exercise, and cessation of smoking.  The patient is aware that without maximal medical management the underlying atherosclerotic disease process will progress, limiting the benefit of any interventions.  The patient was given information about PAD including signs, symptoms, treatment, what symptoms should prompt the patient to seek immediate medical care, and risk reduction measures to take.  Clemon Chambers, RN, MSN, FNP-C [facility] [employer] Phone: [phone]  Clinic MD: Trula Slade  12/16/2015 9:12 AM

## 2016-01-18 ENCOUNTER — Other Ambulatory Visit: Payer: Self-pay | Admitting: Internal Medicine

## 2016-01-18 NOTE — Telephone Encounter (Signed)
Rx request sent to pharmacy.  

## 2016-01-29 ENCOUNTER — Encounter: Payer: Self-pay | Admitting: Internal Medicine

## 2016-01-29 ENCOUNTER — Ambulatory Visit (INDEPENDENT_AMBULATORY_CARE_PROVIDER_SITE_OTHER): Payer: Medicare Other | Admitting: Internal Medicine

## 2016-01-29 VITALS — BP 140/64 | HR 61 | Ht 66.0 in | Wt 197.0 lb

## 2016-01-29 DIAGNOSIS — J449 Chronic obstructive pulmonary disease, unspecified: Secondary | ICD-10-CM

## 2016-01-29 DIAGNOSIS — I2510 Atherosclerotic heart disease of native coronary artery without angina pectoris: Secondary | ICD-10-CM | POA: Diagnosis not present

## 2016-01-29 DIAGNOSIS — I739 Peripheral vascular disease, unspecified: Secondary | ICD-10-CM

## 2016-01-29 DIAGNOSIS — I493 Ventricular premature depolarization: Secondary | ICD-10-CM

## 2016-01-29 NOTE — Progress Notes (Signed)
OFFICE NOTE  Chief Complaint:  No complaints  Primary Care Physician: Geoffery Lyons, MD  HPI:  Dean [name] is a pleasant 70 year old male who is currently referred to me for evaluation of abnormal EKG. Recently he's had some worsening shortness of breath. He also significant back problems and other medical issues. He is a long-standing smoker with a history of hypertension, dyslipidemia and PAD. He recently had a left femoral-femoral bypass for occluded distal aorta. He had a routine physical performed which demonstrated an abnormal EKG done which showed inferior ST depression and PVCs. This is apparently a new finding. He denies any anginal complaints. He is not particularly active due to problems with his back and peripheral arterial disease.  I saw Dean [name] back today For follow-up of his stress test. This is negative for ischemia and showed a small fixed defect which is likely artifact, however scar cannot be excluded as it was a non-gated study. This is somewhat reassuring suggesting that shortness of breath may be more pulmonary in nature. He has reported more persistent, productive cough. He also has a dry cough. He's wondering if this could be related to his lisinopril. He's also noted that his blood pressure is not as well-controlled as it had been in the past with recent increases in diastolic pressure.   Dean [name] returns today for follow-up. He denies any chest pain or worsening shortness of breath. He told me on New Year's Eve he came down with the flu and had cough for about a month. Eventually he has improved. He continues to have some PVCs bits asymptomatic with this. He denies any claudication symptoms but does have history of femoral femoral bypass grafting. He is followed by vascular surgery for this. Cholesterol is followed by his primary care provider.  PMHx:  Past Medical History  Diagnosis Date  . Hyperlipidemia   . Hypertension   . Sleep apnea   . CAD  (coronary artery disease)     s/p angioplasty and stenting x3 1994  . Peripheral vascular disease ([place])   . Allergy     pollen  . Asthma   . Arthritis   . COPD (chronic obstructive pulmonary disease) ([place])   . GERD (gastroesophageal reflux disease)   . Kidney stone   . Atrial fibrillation [facility])     Past Surgical History  Procedure Laterality Date  . Coronary angioplasty with stent placement  1994  . Lumbar disectomy  2005    L3 - 4 - 5  . Appendectomy    . Umbilical hernia repair    . Tonsillectomy    . Pr vein bypass graft,aorto-fem-pop  08/31/11    Right to Left Fem-Fem  . Cholecystectomy  1990s    Gall Bladder    FAMHx:  Family History  Problem Relation Age of Onset  . Other Father     ETOH  . Diabetes Father   . Hypertension Father   . Kidney cancer Mother   . Cancer Mother     renal  . Hyperlipidemia Mother   . Hypertension Mother   . Cancer Sister     breast  . Hyperlipidemia Sister   . Hypertension Sister   . Hyperlipidemia Brother   . Hypertension Brother     SOCHx:   reports that he has been smoking Cigarettes.  He has a 70.5 pack-year smoking history. He has never used smokeless tobacco. He reports that he drinks alcohol. He reports that he does not use illicit drugs.  ALLERGIES:  Allergies  Allergen Reactions  . Penicillins Palpitations    REACTION: rapid pulse    ROS: A comprehensive review of systems was negative except for: Respiratory: positive for cough Musculoskeletal: positive for back pain  HOME MEDS: Current Outpatient Prescriptions  Medication Sig Dispense Refill  . albuterol (PROVENTIL HFA) 108 (90 BASE) MCG/ACT inhaler Inhale 1 puff into the lungs 2 (two) times daily.     Marland Kitchen aspirin 325 MG buffered tablet Take 325 mg by mouth daily.      Marland Kitchen atorvastatin (LIPITOR) 10 MG tablet Take 10 mg by mouth daily.      . Diclofenac Sodium CR 100 MG 24 hr tablet Take 100 mg by mouth as needed.     . diltiazem (CARDIZEM CD) 240 MG 24 hr  capsule Take 240 mg by mouth daily.      Marland Kitchen DYMISTA 137-50 MCG/ACT SUSP INSTILL 1 TO 2 PUFFS INTO EACH NOSTRIL AT BEDTIME 23 g PRN  . esomeprazole (NEXIUM) 40 MG capsule Take 40 mg by mouth daily before breakfast.    . finasteride (PROSCAR) 5 MG tablet Take 1 tablet by mouth daily.    . irbesartan-hydrochlorothiazide (AVALIDE) 150-12.5 MG tablet take 1 tablet by mouth once daily 30 tablet 0  . isosorbide mononitrate (IMDUR) 60 MG 24 hr tablet Take 60 mg by mouth daily.      . traMADol (ULTRAM) 50 MG tablet Take 50 mg by mouth as needed.     . Vitamin D, Ergocalciferol, (DRISDOL) 50000 UNITS CAPS Take 1 tablet by mouth Once a week.     No current facility-administered medications for this visit.    LABS/IMAGING: No results found for this or any previous visit (from the past 48 hour(s)). No results found.  VITALS: BP 140/64 mmHg  Pulse 61  Ht 5\' 6"  (1.676 m)  Wt 197 lb (89.359 kg)  BMI 31.81 kg/m2  EXAM: General appearance: alert and no distress Neck: no carotid bruit and no JVD Lungs: rhonchi bilaterally Heart: regular rate and rhythm and occasional missed beats Abdomen: soft, non-tender; bowel sounds normal; no masses,  no organomegaly Extremities: extremities normal, atraumatic, no cyanosis or edema Pulses: 2+ and symmetric Skin: Skin color, texture, turgor normal. No rashes or lesions Neurologic: Grossly normal Psych: Pleasant  EKG: Sinus rhythm with PVCs at 61  ASSESSMENT: 1. CAD status post PCI 3 in 1994 2. Abnormal EKG with ischemic findings - negative nuclear stress test (2014) 3. PVCs 4. Peripheral arterial disease status post left femorofemoral bypass 5. Hypertension 6. Dyslipidemia 7. Smoking 8. Obesity 9.  COPD-with recent productive cough  PLAN: 1.   Dean [name] is doing generally pretty well. Unfortunate he continues to smoke. He has COPD and got the flu and the end of December and took about a month to improve from that. He denies any new claudication  symptoms and is followed by vascular surgery. Blood pressure is much better controlled. Cholesterol is followed by his primary care provider. He denies any anginal symptoms. His stress test most recently was negative for ischemia although he continues to have some PVCs. He is asymptomatic with those.  Pixie Casino, MD, [facility] Attending Cardiologist [place] C Hilty 01/29/2016, 1:13 PM

## 2016-01-29 NOTE — Patient Instructions (Signed)
Your physician wants you to follow-up in:  1 year with Dr. Hilty.  You will receive a reminder letter in the mail two months in advance. If you don't receive a letter, please call our office to schedule the follow-up appointment.  Your physician recommends that you continue on your current medications as directed. Please refer to the Current Medication list given to you today.  

## 2016-02-24 ENCOUNTER — Encounter: Payer: Self-pay | Admitting: Podiatry

## 2016-02-24 ENCOUNTER — Ambulatory Visit (INDEPENDENT_AMBULATORY_CARE_PROVIDER_SITE_OTHER): Payer: Medicare Other | Admitting: Podiatry

## 2016-02-24 VITALS — BP 151/76 | HR 77 | Resp 12

## 2016-02-24 DIAGNOSIS — I739 Peripheral vascular disease, unspecified: Secondary | ICD-10-CM

## 2016-02-24 DIAGNOSIS — L84 Corns and callosities: Secondary | ICD-10-CM

## 2016-02-24 DIAGNOSIS — L608 Other nail disorders: Secondary | ICD-10-CM

## 2016-02-24 DIAGNOSIS — L609 Nail disorder, unspecified: Secondary | ICD-10-CM | POA: Diagnosis not present

## 2016-02-24 NOTE — Patient Instructions (Signed)
Today your foot examination revealed decreased pulsation right and left feet with a known history of peripheral arterial disease You have slightly decreased feeling in right and left feet Today I trim your elongated incurvated toenails on the right and left feet. There was slight bleeding at the end of the right big toe which was treated with antibiotic ointment and Band-Aid Removed Band-Aid on third right toe 1-3 days and apply topical antibiotic ointment until a scab forms Return as needed for trimming of toenails or at 3-4 month intervals

## 2016-02-24 NOTE — Progress Notes (Signed)
   Subjective:    Patient ID: Dean [name], male    DOB: [birth year], 70 y.o.   MRN: [id number]  HPI    This patient presents today concerned about discolored and elongated toenails, hallux primarily, coming more uncomfortable in the past month. He says the nails are gradually becoming elongated and slightly more discolored and are uncomfortable with direct shoe pressure and walking. Is attempted to trim the nails himself, however, has difficulty trimming his toenails.  Patient states that he does have some circulation problems that have been evaluated in the past and has some ongoing surveillance he does recall having some surgical treatment for peripheral arterial disease  Patient denies history of foot ulceration, claudication or amputation  Review of Systems  HENT: Positive for sinus pressure.   Genitourinary: Positive for urgency and frequency.  Musculoskeletal: Positive for back pain and gait problem.       Objective:   Physical Exam  Orientated 3  Vascular: DP right 0/4 DP left 2/4 PT pulses 0/4 bilaterally Capillary reflex delayed bilaterally  Neurological: Sensation to 10 g monofilament wire intact 3/4 right 4/5 left Vibratory sensation reactive bilaterally Ankle reflex equal and reactive bilaterally  Dermatological: No skin lesions bilaterally Callusing medial plantar first MPJ bilaterally The toenails elongated and incurvated 6-10 with occasional discolored sedation distally including hallux nails  Musculoskeletal: No deformities noted bilaterally There is no restriction ankle, subtalar, midtarsal joints bilaterally       Assessment & Plan:   Assessment: Decrease pedal pulses consistent with known history of peripheral arterial disease Incurvated toenails 6-10 Calluses 2  Plan: Today I reviewed the results of examination with patient today and made aware that he had decreased pulsations which have been under evaluation as he described previously  by vascular doctors The toenails 6-10 were debrided mechanically and electrically. It was slight bleeding distal right hallux after debridement which was treated with antibiotic ointment and Band-Aid. Patient advised to removed Band-Aid on right great toe and 1-3 days and continue applying topical antibiotic ointment and Band-Aid until a scab forms Debrided calluses 2 without any bleeding  Return every 3 months or as needed for skin a nail debridement as patient has peripheral arterial disease

## 2016-02-26 ENCOUNTER — Other Ambulatory Visit: Payer: Self-pay | Admitting: Internal Medicine

## 2016-06-09 ENCOUNTER — Ambulatory Visit (INDEPENDENT_AMBULATORY_CARE_PROVIDER_SITE_OTHER): Payer: Medicare Other | Admitting: Internal Medicine

## 2016-06-09 ENCOUNTER — Encounter: Payer: Self-pay | Admitting: Internal Medicine

## 2016-06-09 VITALS — BP 122/78 | HR 51 | Ht 66.0 in | Wt 194.4 lb

## 2016-06-09 DIAGNOSIS — F17200 Nicotine dependence, unspecified, uncomplicated: Secondary | ICD-10-CM | POA: Diagnosis not present

## 2016-06-09 DIAGNOSIS — G4733 Obstructive sleep apnea (adult) (pediatric): Secondary | ICD-10-CM | POA: Diagnosis not present

## 2016-06-09 DIAGNOSIS — J309 Allergic rhinitis, unspecified: Secondary | ICD-10-CM | POA: Diagnosis not present

## 2016-06-09 DIAGNOSIS — J449 Chronic obstructive pulmonary disease, unspecified: Secondary | ICD-10-CM

## 2016-06-09 DIAGNOSIS — J4489 Other specified chronic obstructive pulmonary disease: Secondary | ICD-10-CM

## 2016-06-09 MED ORDER — GLYCOPYRROLATE-FORMOTEROL 9-4.8 MCG/ACT IN AERO
2.0000 | INHALATION_SPRAY | Freq: Two times a day (BID) | RESPIRATORY_TRACT | Status: DC
Start: 2016-06-09 — End: 2017-02-06

## 2016-06-09 NOTE — Progress Notes (Signed)
Patient ID: Dean [name], male    DOB: 1946/01/02, 70 y.o.   MRN: [id number]  HPI 03/29/11- 65 yoM , ongoing smoker despite continued counseling efforts, followed here for COPD, obstructive sleep apnea, lung nodule, allergic rhinitis. Last here September 29, 2010, when PFT reviewed for 2010- FEV1/FVC 0.48 indicating severe COPD. Did well through the winter.  Now Spring pollen season is bothering him with itching eyes, watering eyes and nose.. Ears don't pop or hurt. Chest tightness and morning cough are also worse lately- blaming pollen. Denies discolored sputum, chest pain or palpitation. Actively uncomfortable with GERD- working with Dr Henrene Pastor.   09/28/11- 88 yoM , ongoing smoker despite continued counseling efforts, followed here for COPD, obstructive sleep apnea, lung nodule, allergic rhinitis. GERD is better. 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 Kellie Simmering. During that time he nearly quit cigarettes but admits still smoking about 4 per day. He has a written taper schedule plan for smoking cessation. I strongly encouraged him in this effort. He had no restart toric complications from his surgery. He is very much aware of shortness of breath. Still has daily cough with clear phlegm, no blood and no chest pain. He does complain of some sinus pressure with little nasal discharge. He continues able to use CPAP all night every night and now has a new mask which he likes. He feels this problem is controlled.   03/28/12-  24 yoM , ongoing smoker despite continued counseling efforts, followed here for COPD, obstructive sleep apnea, lung nodule, allergic rhinitis. Activity is limited more by back pain and his breathing. He continues to smoke. Now pollen is bothering him with nasal congestion. Using nasal saline. Admits cough with white sputum. Denies blood, chest pain, palpitation or recent infection. IMPRESSION: CXR- 09/01/11- reviewed with pt Interval decreased interstitial prominence.  Mild  left lung base/retrocardiac opacity is likely scarring or  atelectasis. A mild/early infiltrate is not excluded in the  appropriate clinical setting.  Original Report Authenticated By: Suanne Marker, M.D.   05/04/12-  36 yoM , ongoing smoker despite continued counseling efforts, followed here for COPD, obstructive sleep apnea, lung nodule, allergic rhinitis. He had called wanting to try a neb treatment before deciding if a home neb would work better than his MDI. He continues to smoke against advice.  09/27/12- 16 yoM , ongoing smoker despite continued counseling efforts, followed here for COPD, obstructive sleep apnea, lung nodule, allergic rhinitis. CPAP [facility] 17-  good  Still has SOB at times; breathing has gotten better since last visit. No acute events and less cough and wheeze as he has decreased smoking. Chronic back pain limits activity. COPD assessment test (CABG) score 12/40 CXR 09/01/11- reviewed with him IMPRESSION:  Interval decreased interstitial prominence.  Mild left lung base/retrocardiac opacity is likely scarring or  atelectasis. A mild/early infiltrate is not excluded in the  appropriate clinical setting.  Original Report Authenticated By: Suanne Marker, M.D.   03/28/13- 67 yoM , ongoing smoker despite continued counseling efforts, followed here for COPD, obstructive sleep apnea, lung nodule, allergic rhinitis. CPAP 17/ [facility] FOLLOWS FOR:SOB worse with activity, wheezing, cough and congestion-productive-clear in color. Denies any fever or chills. Still smoking. We discussed support again. Tree pollen is bothering him some with nasal congestion and light cough. We discussed lisinopril as a possible cause of cough which could be checked by changing to an ARB for trial.  09/27/09-67 yoM, ongoing smoker despite continued counseling efforts, followed  here for COPD, obstructive sleep apnea, lung nodule, allergic rhinitis FOLLOWS FOR: dong pretty good overall; has  good and bad days with SOB and wheezing. CPAP 17/ [name] We discussed his smoking again. Occasional shortness of breath with cough. Came back from visiting [place] with a bronchitis in July. Treated by Dr. Reynaldo Minium with chest x-ray. Compliant with CPAP but still needs occasional nap. Back pain affects sleep quality. CXR 09/27/12 IMPRESSION:  No evidence of acute cardiopulmonary disease.  Original Report Authenticated By: Julian Hy, M.D.  03/28/14- 67 yoM, ongoing smoker despite continued counseling efforts, followed here for COPD, obstructive sleep apnea, lung nodule, allergic rhinitis FOLLOWS FOR: reveiw titration report with patient; Wears CPAP 17/ [facility] every night and at nap time. Pt states allergies have kicked and now causing SOB and wheezing. Continues to smoke against counseling. Download showed great compliance and suggested he could use pressure at 18. He agrees to increase. Spring pollen much worse this year-Dymista helps some  09/29/14- 67 yoM, ongoing smoker despite continued counseling efforts, followed here for COPD, obstructive sleep apnea, lung nodule, allergic rhinitis CPAP/ 18 [facility] every night ; Wants Rx for new mask  03/31/15- 69 yoM, ongoing smoker despite continued counseling efforts, followed here for COPD, obstructive sleep apnea, lung nodule, allergic rhinitis FOLLOW FOR:  COPD.  Breathing doing okay.  no concerns. CPAP 18/ [facility] usually wears less nasal congestion is really bad Admits feeling somewhat depressed. Very limited by back pain. He has had to hire somebody to walk his dog. Not a surgical candidate. CXR 09/29/14 IMPRESSION: No active disease. Stable hyperinflation and chronic mild bronchitic changes. Electronically Signed  By: Lahoma Crocker M.D.  On: 09/29/2014 15:08  12/10/2015-70 year old male smoker followed for COPD, OSA, lung nodule, allergic rhinitis CPAP 18/ [facility] FOLLOWS FOR: No change in breathing since last OV. Wears CPAP nightly x 8hr  and naps during the datytime. Denies problems with mask or pressure.  06/09/2016-70 year old male smoker followed for COPD, OSA, lung nodule, allergic rhinitis CPAP 18/[facility] FOLLOWS FOR: DME [facility]; will need to order DL-we are unable to get DL from his current CPAP; wears nightly. CXR 12/10/2015-mild hyperexpansion and prominent markings with changes of emphysema stable, NAD Old CPAP machine is obsolete and can't be downloaded for documentation. Back pain affects sleep quality Dymista nasal spray over dries.  ROS-see HPI Constitutional:   No-   weight loss, night sweats, fevers, chills, fatigue, lassitude. HEENT:   No-  headaches, difficulty swallowing, tooth/dental problems, sore throat,       No-  sneezing, itching, ear ache, +nasal congestion, post nasal drip,  CV:  No-   chest pain, orthopnea, PND, swelling in lower extremities, anasarca,  dizziness, palpitations Resp: + shortness of breath with exertion or at rest.              No-   productive cough,  + non-productive cough,  No- coughing up of blood.              No-   change in color of mucus.  + wheezing.   Skin: No-   rash or lesions. GI:  No-   heartburn, indigestion, abdominal pain, nausea, vomiting,  GU:  MS:  No-   joint pain or swelling, + back pain . Neuro-     nothing unusual Psych:  No- change in mood or affect. No depression or anxiety.  No memory loss.  Objective:  OBJ- Physical Exam General- Alert, Oriented, Affect-appropriate, Distress- none acute. Odor of tobacco Skin- rash-none, lesions-  none, excoriation- none Lymphadenopathy- none Head- atraumatic            Eyes- Gross vision intact, PERRLA, conjunctivae and secretions clear            Ears- Hearing, canals-normal            Nose- + Turbinate edema, no-Septal dev, mucus, polyps, erosion, perforation             Throat- Mallampati II , mucosa clear , drainage- none, tonsils- atrophic Neck- flexible , trachea midline, no stridor , thyroid nl, carotid no  bruit Chest - symmetrical excursion , unlabored           Heart/CV- RRR , no murmur , no gallop  , no rub, nl s1 s2                           - JVD- none , edema- none, stasis changes- none, varices- none           Lung- + diminished, wheeze- none,  cough-none, dullness-none, rub- none           Chest wall-  Abd-  Br/ Gen/ Rectal- Not done, not indicated Extrem- cyanosis- none, clubbing, none, atrophy- none, strength- nl Neuro- grossly intact to observation

## 2016-06-09 NOTE — Patient Instructions (Addendum)
When your nose gets over-dried, try just using otc Flonase and maybe an otc saline nasal rinse instead of  Dymista, which has the antihistamine azelastine it it.  Sample Bevespi maintenance inhaler    Inhale 2 puffs, twice daily    See if this helps your breathing  Please keep trying to stop smoking  Order- DME [facility]   Please evaluate eligibility to replace old CPAP machine  Auto 10-20, mask of choice, humidifier, supplies, AirView      Dx OSA

## 2016-06-12 NOTE — Assessment & Plan Note (Signed)
Nonspecific with some seasonal allergic rhinitis but a bigger component now of vasomotor rhinitis and overdrying. Plan-nasal saline spray

## 2016-06-12 NOTE — Assessment & Plan Note (Signed)
We talked again about smoking cessation and options

## 2016-06-12 NOTE — Assessment & Plan Note (Signed)
Prospects are limited unless he is prepared to stop smoking as explained. Plan-try sample Bevespi LABA/ LAMA

## 2016-06-12 NOTE — Assessment & Plan Note (Addendum)
Reports good compliance and control with CPAP. Able to use it every night. Plan-evaluate for replacement of old CPAP machine

## 2016-06-13 ENCOUNTER — Telehealth: Payer: Self-pay | Admitting: Internal Medicine

## 2016-06-13 NOTE — Telephone Encounter (Signed)
lmtcb x1 for pt. 

## 2016-06-13 NOTE — Telephone Encounter (Signed)
Pt calling back

## 2016-06-13 NOTE — Telephone Encounter (Signed)
Spoke with pt. Advised him that CY instructed him to get Flonase OTC. He agreed and verbalized understanding. Nothing further was needed.

## 2016-09-08 ENCOUNTER — Other Ambulatory Visit: Payer: Self-pay | Admitting: Internal Medicine

## 2016-09-08 DIAGNOSIS — R221 Localized swelling, mass and lump, neck: Secondary | ICD-10-CM

## 2016-09-09 ENCOUNTER — Ambulatory Visit
Admission: RE | Admit: 2016-09-09 | Discharge: 2016-09-09 | Disposition: A | Discharge status: Home / Self Care | Payer: Medicare Other | Source: Ambulatory Visit | Attending: Internal Medicine | Admitting: Internal Medicine

## 2016-09-09 DIAGNOSIS — R221 Localized swelling, mass and lump, neck: Secondary | ICD-10-CM

## 2016-09-09 MED ORDER — IOPAMIDOL (ISOVUE-300) INJECTION 61%
75.0000 mL | Freq: Once | INTRAVENOUS | Status: AC | PRN
  Administered 2016-09-09: 75 mL via INTRAVENOUS

## 2016-09-12 ENCOUNTER — Encounter: Payer: Self-pay | Admitting: Internal Medicine

## 2016-09-12 ENCOUNTER — Ambulatory Visit (INDEPENDENT_AMBULATORY_CARE_PROVIDER_SITE_OTHER): Payer: Medicare Other | Admitting: Internal Medicine

## 2016-09-12 VITALS — BP 122/80 | HR 52 | Ht 66.0 in | Wt 188.6 lb

## 2016-09-12 DIAGNOSIS — J449 Chronic obstructive pulmonary disease, unspecified: Secondary | ICD-10-CM | POA: Diagnosis not present

## 2016-09-12 DIAGNOSIS — G4733 Obstructive sleep apnea (adult) (pediatric): Secondary | ICD-10-CM | POA: Diagnosis not present

## 2016-09-12 NOTE — Patient Instructions (Addendum)
Order- DME [name]- please provide pressure compliance download for CPAP. Patient doesn't have SD card.  Dx OSA Continue CPAP auto 10-20, mask of choice, humidifier, supplies, AirView  Sample Bevespi # 2     Inhale 2 puffs, twice daily    See if this helps breathing  Please work hard to get off the cigarettes- You don't want any more lumps popping up.

## 2016-09-12 NOTE — Progress Notes (Signed)
Patient ID: Dean [name], male    DOB: 1946/03/05, 70 y.o.   MRN: [id number]  HPI M  Smoker followed for COPD, obstructive sleep apnea, lung nodule, allergic rhinitis.  06/09/2016-70 year old male smoker followed for COPD, OSA, lung nodule, allergic rhinitis CPAP 18/[facility] FOLLOWS FOR: DME [facility]; will need to order DL-we are unable to get DL from his current CPAP; wears nightly. CXR 12/10/2015-mild hyperexpansion and prominent markings with changes of emphysema stable, NAD Old CPAP machine is obsolete and can't be downloaded for documentation. Back pain affects sleep quality Dymista nasal spray over dries.  09/12/2016-70 year old male smoker followed for COPD, OSA, lung nodule, allergic rhinitis CPAP auto 10-20/[facility] Being evaluated for parotid mass CXR 12/10/2015-COPD changes, no acute cardiopulmonary process. FOLLOWS FOR: DME: [facility]. Pt states he wears CPAP nightly-never goes to sleep/nap without it. No new supplies needed at this time.  Will need to place order to [place] for DL as pt does not have SD card in CPAP. He thinks his breathing is stable although he still smokes few cigarettes daily against advice. Chronic cough occasionally productive white sputum with no blood. Denies chest pain or palpitation. Easy dyspnea on exertion with brisk walk, hills or stairs. 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 Comfortable with CPAP 10-20 auto, new machine/[facility] Wants to wait on flu vaccine  ROS-see HPI Constitutional:   No-   weight loss, night sweats, fevers, chills, fatigue, lassitude. HEENT:   No-  headaches, difficulty swallowing, tooth/dental problems, sore throat,       No-  sneezing, itching, ear ache, +nasal congestion, post nasal drip,  CV:  No-   chest pain, orthopnea, PND, swelling in lower extremities, anasarca,  dizziness, palpitations Resp: + shortness of breath with exertion or at rest.              No-    productive cough,  + non-productive cough,  No- coughing up of blood.              No-   change in color of mucus.  + wheezing.   Skin: No-   rash or lesions. GI:  No-   heartburn, indigestion, abdominal pain, nausea, vomiting,  GU:  MS:  No-   joint pain or swelling, + back pain . Neuro-     nothing unusual Psych:  No- change in mood or affect. No depression or anxiety.  No memory loss.  Objective:  OBJ- Physical Exam General- Alert, Oriented, Affect-appropriate, Distress- none acute. Odor of tobacco Skin- rash-none, lesions- none, excoriation- none Lymphadenopathy- none Head- atraumatic            Eyes- Gross vision intact, PERRLA, conjunctivae and secretions clear            Ears- Hearing, canals-normal            Nose- + Turbinate edema, no-Septal dev, mucus, polyps, erosion, perforation             Throat- Mallampati II , mucosa clear , drainage- none, tonsils- atrophic Neck- flexible , trachea midline, no stridor , thyroid nl, carotid no bruit Chest - symmetrical excursion , unlabored           Heart/CV- RRR , no murmur , no gallop  , no rub, nl s1 s2                           - JVD-  none , edema- none, stasis changes- none, varices- none           Lung- + diminished, wheeze- none,  cough-none, dullness-none, rub- none           Chest wall-  Abd-  Br/ Gen/ Rectal- Not done, not indicated Extrem- cyanosis- none, clubbing, none, atrophy- none, strength- nl Neuro- grossly intact to observation

## 2016-09-14 NOTE — Assessment & Plan Note (Signed)
He describes comfortable good compliance with CPAP auto 10-20/[facility] Plan-download for pressure compliance documentation

## 2016-09-14 NOTE — Assessment & Plan Note (Signed)
Near baseline currently without recent exacerbation. Plan-try Bevespi maintenance inhaler to see if it helps

## 2016-09-26 ENCOUNTER — Encounter: Payer: Self-pay | Admitting: Internal Medicine

## 2016-12-08 ENCOUNTER — Encounter: Payer: Self-pay | Admitting: Family

## 2016-12-20 ENCOUNTER — Ambulatory Visit (INDEPENDENT_AMBULATORY_CARE_PROVIDER_SITE_OTHER): Payer: Medicare Other | Admitting: Family

## 2016-12-20 ENCOUNTER — Ambulatory Visit (HOSPITAL_COMMUNITY)
Admission: RE | Admit: 2016-12-20 | Discharge: 2016-12-20 | Disposition: A | Discharge status: Home / Self Care | Payer: Medicare Other | Source: Ambulatory Visit | Attending: Family | Admitting: Family

## 2016-12-20 ENCOUNTER — Ambulatory Visit (INDEPENDENT_AMBULATORY_CARE_PROVIDER_SITE_OTHER)
Admission: RE | Admit: 2016-12-20 | Discharge: 2016-12-20 | Disposition: A | Discharge status: Home / Self Care | Payer: Medicare Other | Source: Ambulatory Visit | Attending: Family | Admitting: Family

## 2016-12-20 ENCOUNTER — Encounter: Payer: Self-pay | Admitting: Family

## 2016-12-20 VITALS — BP 132/71 | HR 51 | Temp 97.7°F | Resp 18 | Ht 66.0 in | Wt 191.9 lb

## 2016-12-20 DIAGNOSIS — F17200 Nicotine dependence, unspecified, uncomplicated: Secondary | ICD-10-CM | POA: Insufficient documentation

## 2016-12-20 DIAGNOSIS — Z4889 Encounter for other specified surgical aftercare: Secondary | ICD-10-CM

## 2016-12-20 DIAGNOSIS — I779 Disorder of arteries and arterioles, unspecified: Secondary | ICD-10-CM | POA: Diagnosis not present

## 2016-12-20 DIAGNOSIS — Z48812 Encounter for surgical aftercare following surgery on the circulatory system: Secondary | ICD-10-CM

## 2016-12-20 DIAGNOSIS — Z95828 Presence of other vascular implants and grafts: Secondary | ICD-10-CM

## 2016-12-20 DIAGNOSIS — I739 Peripheral vascular disease, unspecified: Secondary | ICD-10-CM | POA: Insufficient documentation

## 2016-12-20 DIAGNOSIS — I745 Embolism and thrombosis of iliac artery: Secondary | ICD-10-CM | POA: Diagnosis not present

## 2016-12-20 NOTE — Progress Notes (Signed)
[facility] [employer]   CC: Follow up peripheral artery occlusive disease  History of Present Illness Dean [name] is a 71 y.o. male patient of Dr. 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 He returns today for routine surveillance.   He does have significant back arthritic lumbar spine problems which apparently are not amenable to surgery. His biggest issue is his back pain. His low back gives out before his legs do. He denies non-healing wounds, denies rest pain. Pt denies any history of stroke or TIA.  He reports walking as little as possible as it aggravates his back pain which is worse; this is managed with analgesics that he states he tries not to take.  Patient reports January 2017 stress test result was "fine". His cardiologist is Dr. Debara Pickett.   Pt Diabetic: No Pt smoker: smoker (1.5 ppd x 46 yrs)  Pt meds include: Statin :Yes ASA: Yes Other anticoagulants/antiplatelets: no    Past Medical History:  Diagnosis Date  . Allergy    pollen  . Arthritis   . Asthma   . Atrial fibrillation ([place])   . CAD (coronary artery disease)    s/p angioplasty and stenting x3 1994  . COPD (chronic obstructive pulmonary disease) ([place])   . GERD (gastroesophageal reflux disease)   . Hyperlipidemia   . Hypertension   . Kidney stone   . Peripheral vascular disease ([place])   . Sleep apnea     Social History Social History  Substance Use Topics  . Smoking status: Current Every Day Smoker    Packs/day: 1.50    Years: 47.00    Types: Cigarettes  . Smokeless tobacco: Never Used     Comment: use e cig as well  . Alcohol use 0.0 oz/week     Comment: rarely    Family History Family History  Problem Relation Age of Onset  . Other Father     ETOH  . Diabetes Father   . Hypertension Father   . Kidney cancer Mother   . Cancer Mother     renal  . Hyperlipidemia Mother   .  Hypertension Mother   . Cancer Sister     breast  . Hyperlipidemia Sister   . Hypertension Sister   . Hyperlipidemia Brother   . Hypertension Brother     Past Surgical History:  Procedure Laterality Date  . APPENDECTOMY    . CHOLECYSTECTOMY  1990s   Gall Bladder  . CORONARY ANGIOPLASTY WITH STENT PLACEMENT  1994  . lumbar disectomy  2005   L3 - 4 - 5  . PR VEIN BYPASS GRAFT,AORTO-FEM-POP  08/31/11   Right to Left Fem-Fem  . TONSILLECTOMY    . UMBILICAL HERNIA REPAIR      Allergies  Allergen Reactions  . Penicillins Palpitations    REACTION: rapid pulse    Current Outpatient Prescriptions  Medication Sig Dispense Refill  . albuterol (PROVENTIL HFA) 108 (90 BASE) MCG/ACT inhaler Inhale 1 puff into the lungs 2 (two) times daily.     Marland Kitchen aspirin 325 MG buffered tablet Take 325 mg by mouth daily.      Marland Kitchen atorvastatin (LIPITOR) 10 MG tablet Take 10 mg by mouth daily.      . Diclofenac Sodium CR 100 MG 24 hr tablet Take 100 mg by mouth as needed.     . diltiazem (CARDIZEM CD) 240 MG 24 hr capsule Take 240 mg  by mouth daily.      Marland Kitchen DYMISTA 137-50 MCG/ACT SUSP INSTILL 1 TO 2 PUFFS INTO EACH NOSTRIL AT BEDTIME 23 g PRN  . esomeprazole (NEXIUM) 40 MG capsule Take 40 mg by mouth daily before breakfast.    . finasteride (PROSCAR) 5 MG tablet Take 1 tablet by mouth daily.    . Glycopyrrolate-Formoterol (BEVESPI AEROSPHERE) 9-4.8 MCG/ACT AERO Inhale 2 puffs into the lungs 2 (two) times daily. 1 Inhaler 0  . irbesartan-hydrochlorothiazide (AVALIDE) 150-12.5 MG tablet take 1 tablet by mouth once daily 30 tablet 10  . isosorbide mononitrate (IMDUR) 60 MG 24 hr tablet Take 60 mg by mouth daily.      . traMADol (ULTRAM) 50 MG tablet Take 50 mg by mouth as needed.     . Vitamin D, Ergocalciferol, (DRISDOL) 50000 UNITS CAPS Take 1 tablet by mouth Once a week.     No current facility-administered medications for this visit.     ROS: See HPI for pertinent positives and negatives.   Physical  Examination  Vitals:   12/20/16 1321  BP: 132/71  Pulse: (!) 51  Resp: 18  Temp: 97.7 F (36.5 C)  TempSrc: Oral  SpO2: 90%  Weight: 191 lb 14.4 oz (87 kg)  Height: 5\' 6"  (1.676 m)   Body mass index is 30.97 kg/m.  General: A&O x 3, WDWN, obese male. Gait: normal Eyes: PERRLA, Pulmonary: CTAB, diminished breath sounds in all fields, without wheezes, rales, or rhonchi Cardiac: regular rhythm, no detected murmur     Carotid Bruits Left Right   Negative Negative  Aorta: is not palpable Radial pulses: 2+ palpable and =   VASCULAR EXAM: Extremities without ischemic changes  without Gangrene; without open wounds. 2+ palpable fem-fem bypass graft     LE Pulses LEFT RIGHT   FEMORAL Not  Palpable(obese) not palpable (obese)    POPLITEAL not palpable  not palpable   POSTERIOR TIBIAL not palpable  not palpable    DORSALIS PEDIS  ANTERIOR TIBIAL not palpable  2+ palpable    Abdomen: soft, NT, no palpable masses, large panus. Skin: no rashes, no ulcers. Musculoskeletal: no muscle wasting or atrophy. Neurologic: A&O X 3; Appropriate Affect ; SENSATION: normal; MOTOR FUNCTION: moving all extremities.   ASSESSMENT: Dean [name] is a 71 y.o. 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 He does not walk enough to elicit claudication symptoms due to radiculopathy which has worsened and pt is walking less. He has no signs of ischemia in his LE's.   DATA Today's fem-fem bypass duplex suggests a patent right to left femoral to femoral graft with no significant stenosis.  ABI's improved bilaterally: Right was 0.66 on 12-16-15, today is 0.74 with bi and monophasic  waveforms, TBI: 0.48 (0.47 on 12-16-15) Left was 0.70, today is 0.81 with monophasic waveforms, TBI: 0.53 (was 0.56)    PLAN:  The patient was counseled re smoking cessation and given several free resources re smoking cessation. Daily seated leg exercises demonstrated and discussed.   Based on the patient's vascular studies and examination, pt will return to clinic in 1 year with ABI's. I advised him to notify us if he develops non healing wounds in his feet or legs, or if he develops concerns re the circulation in his legs.   I discussed in depth with the patient the nature of atherosclerosis, and emphasized the importance of maximal medical management including strict control of  blood pressure, blood glucose, and lipid levels, obtaining regular exercise, and cessation of smoking.  The patient is aware that without maximal medical management the underlying atherosclerotic disease process will progress, limiting the benefit of any interventions.  The patient was given information about PAD including signs, symptoms, treatment, what symptoms should prompt the patient to seek immediate medical care, and risk reduction measures to take.  Clemon Chambers, RN, MSN, FNP-C [facility] Phone: [phone]  Clinic MD: Early  12/20/16 1:23 PM

## 2016-12-20 NOTE — Patient Instructions (Signed)
Peripheral Vascular Disease Peripheral vascular disease (PVD) is a disease of the blood vessels that are not part of your heart and brain. A simple term for PVD is poor circulation. In most cases, PVD narrows the blood vessels that carry blood from your heart to the rest of your body. This can result in a decreased supply of blood to your arms, legs, and internal organs, like your stomach or kidneys. However, it most often affects a person's lower legs and feet. There are two types of PVD.  Organic PVD. This is the more common type. It is caused by damage to the structure of blood vessels.  Functional PVD. This is caused by conditions that make blood vessels contract and tighten (spasm). Without treatment, PVD tends to get worse over time. PVD can also lead to acute ischemic limb. This is when an arm or limb suddenly has trouble getting enough blood. This is a medical emergency. Follow these instructions at home:  Take medicines only as told by your doctor.  Do not use any tobacco products, including cigarettes, chewing tobacco, or electronic cigarettes. If you need help quitting, ask your doctor.  Lose weight if you are overweight, and maintain a healthy weight as told by your doctor.  Eat a diet that is low in fat and cholesterol. If you need help, ask your doctor.  Exercise regularly. Ask your doctor for some good activities for you.  Take good care of your feet.  Wear comfortable shoes that fit well.  Check your feet often for any cuts or sores. Contact a doctor if:  You have cramps in your legs while walking.  You have leg pain when you are at rest.  You have coldness in a leg or foot.  Your skin changes.  You are unable to get or have an erection (erectile dysfunction).  You have cuts or sores on your feet that are not healing. Get help right away if:  Your arm or leg turns cold and blue.  Your arms or legs become red, warm, swollen, painful, or numb.  You have  chest pain or trouble breathing.  You suddenly have weakness in your face, arm, or leg.  You become very confused or you cannot speak.  You suddenly have a very bad headache.  You suddenly cannot see. This information is not intended to replace advice given to you by your health care provider. Make sure you discuss any questions you have with your health care provider. Document Released: 03/01/2010 Document Revised: 05/12/2016 Document Reviewed: 05/15/2014 Elsevier Interactive Patient Education  2017 Elsevier Inc.      Steps to Quit Smoking Smoking tobacco can be bad for your health. It can also affect almost every organ in your body. Smoking puts you and people around you at risk for many serious long-lasting (chronic) diseases. Quitting smoking is hard, but it is one of the best things that you can do for your health. It is never too late to quit. What are the benefits of quitting smoking? When you quit smoking, you lower your risk for getting serious diseases and conditions. They can include:  Lung cancer or lung disease.  Heart disease.  Stroke.  Heart attack.  Not being able to have children (infertility).  Weak bones (osteoporosis) and broken bones (fractures). If you have coughing, wheezing, and shortness of breath, those symptoms may get better when you quit. You may also get sick less often. If you are pregnant, quitting smoking can help to lower your chances   of having a baby of low birth weight. What can I do to help me quit smoking? Talk with your doctor about what can help you quit smoking. Some things you can do (strategies) include:  Quitting smoking totally, instead of slowly cutting back how much you smoke over a period of time.  Going to in-person counseling. You are more likely to quit if you go to many counseling sessions.  Using resources and support systems, such as:  Online chats with a counselor.  Phone quitlines.  Printed self-help  materials.  Support groups or group counseling.  Text messaging programs.  Mobile phone apps or applications.  Taking medicines. Some of these medicines may have nicotine in them. If you are pregnant or breastfeeding, do not take any medicines to quit smoking unless your doctor says it is okay. Talk with your doctor about counseling or other things that can help you. Talk with your doctor about using more than one strategy at the same time, such as taking medicines while you are also going to in-person counseling. This can help make quitting easier. What things can I do to make it easier to quit? Quitting smoking might feel very hard at first, but there is a lot that you can do to make it easier. Take these steps:  Talk to your family and friends. Ask them to support and encourage you.  Call phone quitlines, reach out to support groups, or work with a counselor.  Ask people who smoke to not smoke around you.  Avoid places that make you want (trigger) to smoke, such as:  Bars.  Parties.  Smoke-break areas at work.  Spend time with people who do not smoke.  Lower the stress in your life. Stress can make you want to smoke. Try these things to help your stress:  Getting regular exercise.  Deep-breathing exercises.  Yoga.  Meditating.  Doing a body scan. To do this, close your eyes, focus on one area of your body at a time from head to toe, and notice which parts of your body are tense. Try to relax the muscles in those areas.  Download or buy apps on your mobile phone or tablet that can help you stick to your quit plan. There are many free apps, such as QuitGuide from the CDC (Centers for Disease Control and Prevention). You can find more support from smokefree.gov and other websites. This information is not intended to replace advice given to you by your health care provider. Make sure you discuss any questions you have with your health care provider. Document Released:  10/01/2009 Document Revised: 08/02/2016 Document Reviewed: 04/21/2015 Elsevier Interactive Patient Education  2017 Elsevier Inc.  

## 2016-12-21 NOTE — Addendum Note (Signed)
Addended by: Lianne Cure A on: 12/21/2016 11:54 AM   Modules accepted: Orders

## 2016-12-27 ENCOUNTER — Ambulatory Visit: Payer: Medicare Other | Admitting: Internal Medicine

## 2017-01-13 ENCOUNTER — Other Ambulatory Visit: Payer: Self-pay | Admitting: Internal Medicine

## 2017-01-25 ENCOUNTER — Other Ambulatory Visit: Payer: Self-pay | Admitting: Internal Medicine

## 2017-01-25 NOTE — Telephone Encounter (Signed)
Rx(s) sent to pharmacy electronically.  

## 2017-02-06 ENCOUNTER — Encounter: Payer: Self-pay | Admitting: Internal Medicine

## 2017-02-06 ENCOUNTER — Ambulatory Visit (INDEPENDENT_AMBULATORY_CARE_PROVIDER_SITE_OTHER): Payer: Medicare Other | Admitting: Internal Medicine

## 2017-02-06 VITALS — BP 120/52 | HR 59 | Ht 66.0 in | Wt 192.0 lb

## 2017-02-06 DIAGNOSIS — I2510 Atherosclerotic heart disease of native coronary artery without angina pectoris: Secondary | ICD-10-CM

## 2017-02-06 DIAGNOSIS — J449 Chronic obstructive pulmonary disease, unspecified: Secondary | ICD-10-CM

## 2017-02-06 DIAGNOSIS — F17200 Nicotine dependence, unspecified, uncomplicated: Secondary | ICD-10-CM | POA: Diagnosis not present

## 2017-02-06 DIAGNOSIS — I10 Essential (primary) hypertension: Secondary | ICD-10-CM | POA: Diagnosis not present

## 2017-02-06 DIAGNOSIS — Z95828 Presence of other vascular implants and grafts: Secondary | ICD-10-CM

## 2017-02-06 DIAGNOSIS — I739 Peripheral vascular disease, unspecified: Secondary | ICD-10-CM

## 2017-02-06 NOTE — Progress Notes (Signed)
OFFICE NOTE  Chief Complaint:  No complaints  Primary Care Physician: Geoffery Lyons, MD  HPI:  Dean [name] is a pleasant 71 year old male who is currently referred to me for evaluation of abnormal EKG. Recently he's had some worsening shortness of breath. He also significant back problems and other medical issues. He is a long-standing smoker with a history of hypertension, dyslipidemia and PAD. He recently had a left femoral-femoral bypass for occluded distal aorta. He had a routine physical performed which demonstrated an abnormal EKG done which showed inferior ST depression and PVCs. This is apparently a new finding. He denies any anginal complaints. He is not particularly active due to problems with his back and peripheral arterial disease.  I saw Dean [name] back today For follow-up of his stress test. This is negative for ischemia and showed a small fixed defect which is likely artifact, however scar cannot be excluded as it was a non-gated study. This is somewhat reassuring suggesting that shortness of breath may be more pulmonary in nature. He has reported more persistent, productive cough. He also has a dry cough. He's wondering if this could be related to his lisinopril. He's also noted that his blood pressure is not as well-controlled as it had been in the past with recent increases in diastolic pressure.   Dean [name] returns today for follow-up. He denies any chest pain or worsening shortness of breath. He told me on New Year's Eve he came down with the flu and had cough for about a month. Eventually he has improved. He continues to have some PVCs bits asymptomatic with this. He denies any claudication symptoms but does have history of femoral femoral bypass grafting. He is followed by vascular surgery for this. Cholesterol is followed by his primary care provider.  02/06/2017  Dean [name] returns today for follow-up. Since I last saw him he feels [name] he is doing fairly  well. Unfortunately he is not able to stop smoking due to significant stresses in his life. This is despite having chronic lung disease, PAD and coronary artery disease. He's not had any upper respiratory infections this year. He is followed by vascular surgery due to his history of femoral bypass grafting. He denies any chest pain. EKG shows sinus bradycardia with nonspecific interventricular conduction delay. Blood pressure is well-controlled today.  PMHx:  Past Medical History:  Diagnosis Date  . Allergy    pollen  . Arthritis   . Asthma   . Atrial fibrillation ([place])   . CAD (coronary artery disease)    s/p angioplasty and stenting x3 1994  . COPD (chronic obstructive pulmonary disease) ([place])   . GERD (gastroesophageal reflux disease)   . Hyperlipidemia   . Hypertension   . Kidney stone   . Peripheral vascular disease ([place])   . Sleep apnea     Past Surgical History:  Procedure Laterality Date  . APPENDECTOMY    . CHOLECYSTECTOMY  1990s   Gall Bladder  . CORONARY ANGIOPLASTY WITH STENT PLACEMENT  1994  . lumbar disectomy  2005   L3 - 4 - 5  . PR VEIN BYPASS GRAFT,AORTO-FEM-POP  08/31/11   Right to Left Fem-Fem  . TONSILLECTOMY    . UMBILICAL HERNIA REPAIR      FAMHx:  Family History  Problem Relation Age of Onset  . Other Father     ETOH  . Diabetes Father   . Hypertension Father   . Kidney cancer Mother   . Cancer Mother  renal  . Hyperlipidemia Mother   . Hypertension Mother   . Cancer Sister     breast  . Hyperlipidemia Sister   . Hypertension Sister   . Hyperlipidemia Brother   . Hypertension Brother     SOCHx:   reports that he has been smoking Cigarettes.  He has a 70.50 pack-year smoking history. He has never used smokeless tobacco. He reports that he drinks alcohol. He reports that he does not use drugs.  ALLERGIES:  Allergies  Allergen Reactions  . Penicillins Palpitations    REACTION: rapid pulse    ROS: Pertinent items noted in HPI and  remainder of comprehensive ROS otherwise negative.  HOME MEDS: Current Outpatient Prescriptions  Medication Sig Dispense Refill  . albuterol (PROVENTIL HFA) 108 (90 BASE) MCG/ACT inhaler Inhale 1 puff into the lungs 2 (two) times daily.     Marland Kitchen aspirin 325 MG buffered tablet Take 325 mg by mouth daily.      Marland Kitchen atorvastatin (LIPITOR) 10 MG tablet Take 10 mg by mouth daily.      . Diclofenac Sodium CR 100 MG 24 hr tablet Take 100 mg by mouth as needed.     . diltiazem (CARDIZEM CD) 240 MG 24 hr capsule Take 240 mg by mouth daily.      Marland Kitchen DYMISTA 137-50 MCG/ACT SUSP INSTILL 1 TO 2 SPRAYS INTO EACH NOSTRIL AT BEDTIME 23 g 3  . esomeprazole (NEXIUM) 40 MG capsule Take 40 mg by mouth daily before breakfast.    . finasteride (PROSCAR) 5 MG tablet Take 1 tablet by mouth daily.    . irbesartan-hydrochlorothiazide (AVALIDE) 150-12.5 MG tablet take 1 tablet by mouth once daily 30 tablet 5  . isosorbide mononitrate (IMDUR) 60 MG 24 hr tablet Take 60 mg by mouth daily.      . traMADol (ULTRAM) 50 MG tablet Take 50 mg by mouth as needed.     . Vitamin D, Ergocalciferol, (DRISDOL) 50000 UNITS CAPS Take 1 tablet by mouth Once a week.     No current facility-administered medications for this visit.     LABS/IMAGING: No results found for this or any previous visit (from the past 48 hour(s)). No results found.  VITALS: BP (!) 120/52   Pulse (!) 59   Ht 5\' 6"  (1.676 m)   Wt 192 lb (87.1 kg)   BMI 30.99 kg/m   EXAM: General appearance: alert and no distress Neck: no carotid bruit and no JVD Lungs: rhonchi bilaterally Heart: regular rate and rhythm and occasional missed beats Abdomen: soft, non-tender; bowel sounds normal; no masses,  no organomegaly Extremities: extremities normal, atraumatic, no cyanosis or edema Pulses: 2+ and symmetric Skin: Skin color, texture, turgor normal. No rashes or lesions Neurologic: Grossly normal Psych: Pleasant  EKG: Eyes bradycardia 59, nonspecific  interventricular conduction delay  ASSESSMENT: 1. CAD status post PCI 3 in 1994 2. Abnormal EKG with ischemic findings - negative nuclear stress test (2014) 3. PVCs 4. Peripheral arterial disease status post left femorofemoral bypass 5. Hypertension 6. Dyslipidemia 7. Smoking 8. Obesity 9.  COPD  PLAN: 1.   Dean [name] is doing generally pretty well. Unfortunate he continues to smoke. He has COPD. No new or worsening coronary symptoms or worsening claudication. Blood pressure is well-controlled today. We'll obtain labs from his primary care provider regarding cholesterol profile. Follow-up annually or sooner as necessary.  Pixie Casino, MD, [facility] Attending Cardiologist [place] C Angie Hogg 02/06/2017, 1:17 PM

## 2017-02-06 NOTE — Patient Instructions (Signed)
Your physician wants you to follow-up in: ONE YEAR with Dr. Hilty. You will receive a reminder letter in the mail two months in advance. If you don't receive a letter, please call our office to schedule the follow-up appointment.  

## 2017-03-19 ENCOUNTER — Encounter: Payer: Self-pay | Admitting: Internal Medicine

## 2017-03-19 IMAGING — DX DG CHEST 2V
2 series · 2 of 2 positions shown · non-contrast
Comparison: 09/29/2014.

CLINICAL DATA: Routine exam. No chest complaints. History of
hypertension, COPD and smoking.

EXAM:
CHEST  2 VIEW

[chest pa]
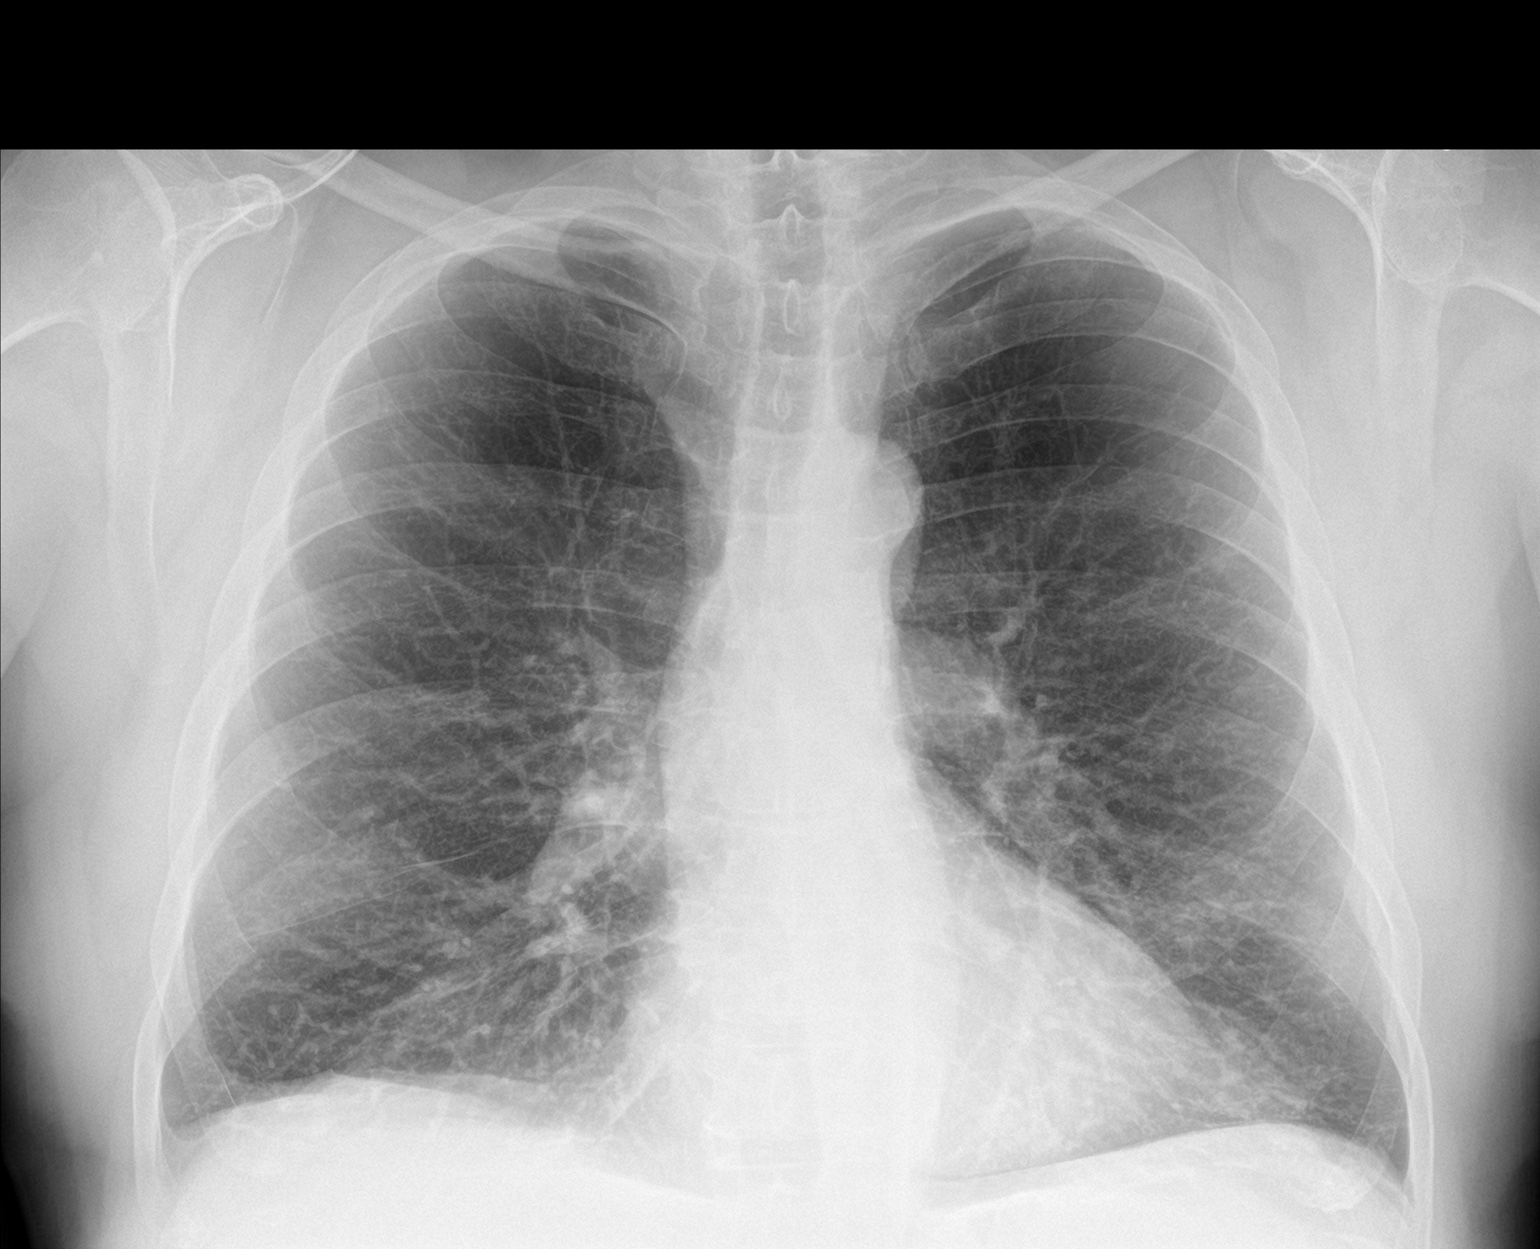

[chest lat]
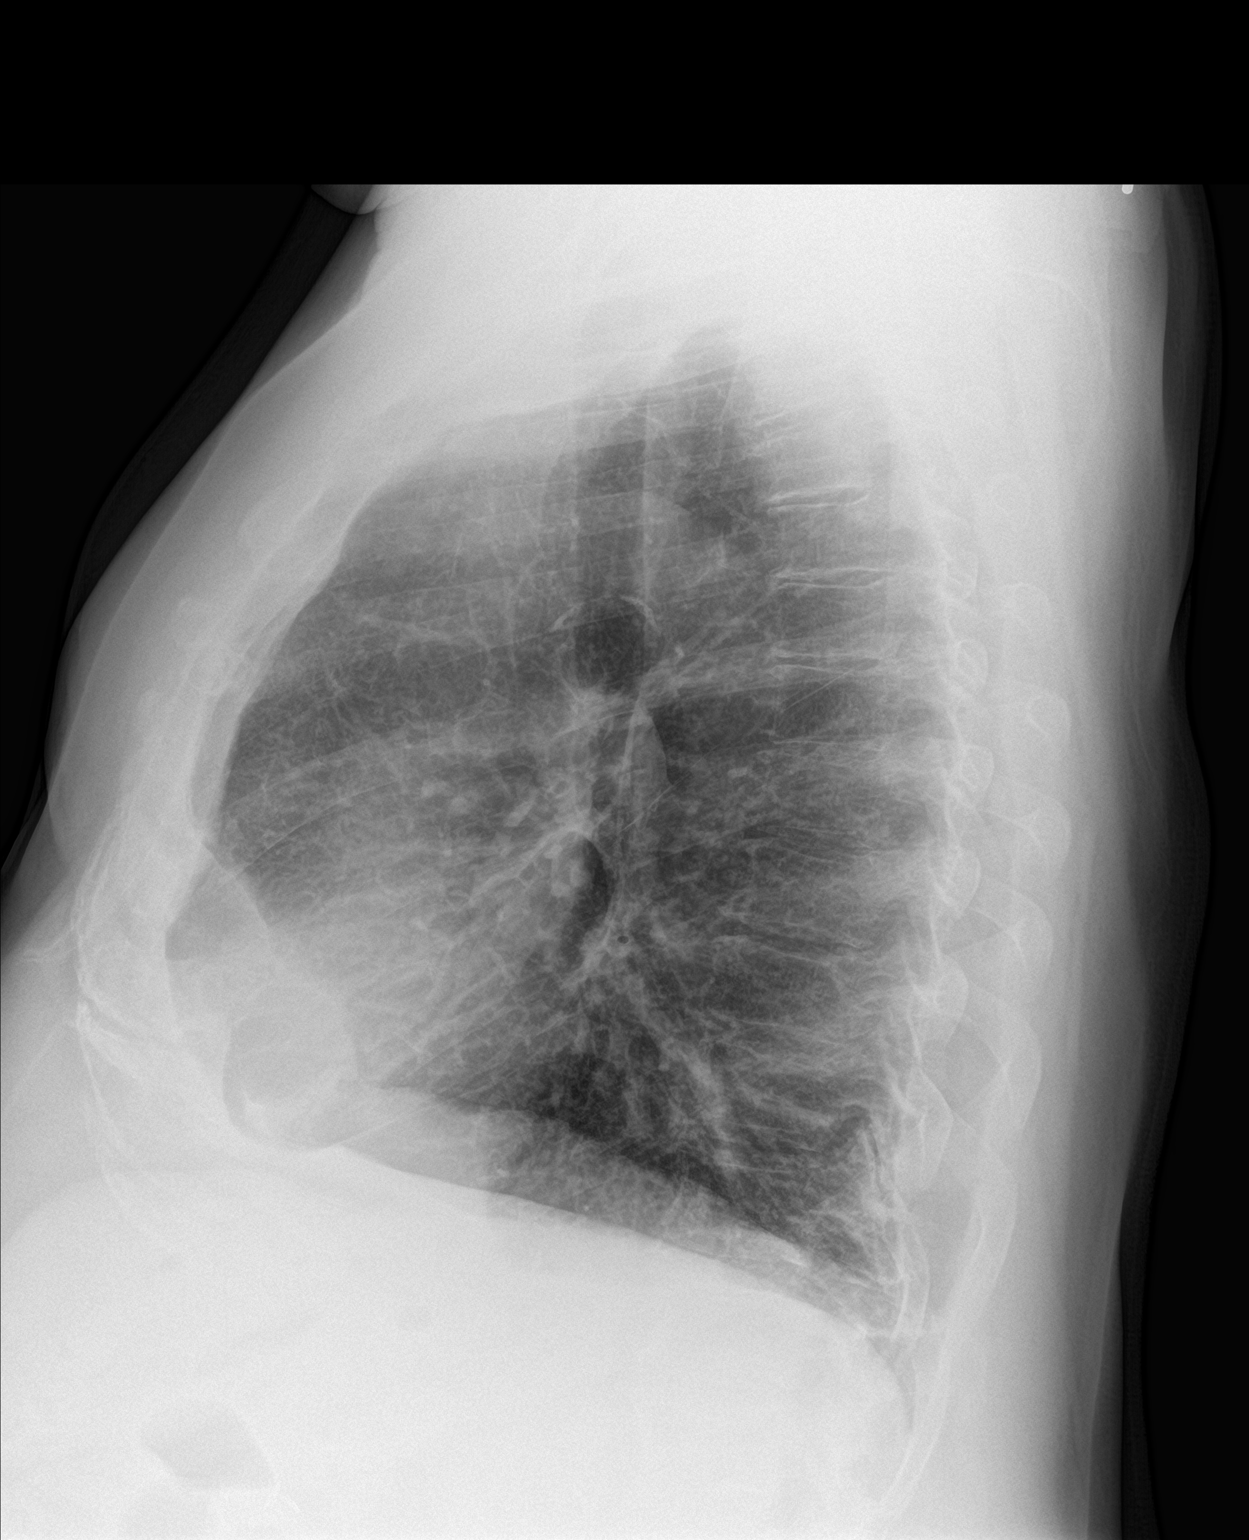

[2 of 2 positions shown; findings below may reference images not displayed]

FINDINGS: Cardiac silhouette is normal in size and configuration. The aorta is
slightly tortuous. No mediastinal or hilar masses or evidence of
adenopathy.

Lungs are mildly hyperexpanded. There are thickened
bronchovascular/interstitial markings most evident in the lung
bases. A paucity of vascular markings is noted upper lobes
consistent with emphysema. These findings are stable. No evidence of
pneumonia or pulmonary edema. No pleural effusion or pneumothorax.

The bony structures are demineralized but grossly intact.
IMPRESSION: No acute cardiopulmonary disease.

## 2017-03-20 ENCOUNTER — Ambulatory Visit (INDEPENDENT_AMBULATORY_CARE_PROVIDER_SITE_OTHER): Payer: Medicare Other | Admitting: Internal Medicine

## 2017-03-20 ENCOUNTER — Encounter: Payer: Self-pay | Admitting: Internal Medicine

## 2017-03-20 ENCOUNTER — Ambulatory Visit (INDEPENDENT_AMBULATORY_CARE_PROVIDER_SITE_OTHER)
Admission: RE | Admit: 2017-03-20 | Discharge: 2017-03-20 | Disposition: A | Discharge status: Home / Self Care | Payer: Medicare Other | Source: Ambulatory Visit | Attending: Internal Medicine | Admitting: Internal Medicine

## 2017-03-20 VITALS — BP 116/70 | HR 61 | Ht 64.5 in | Wt 192.8 lb

## 2017-03-20 DIAGNOSIS — F17200 Nicotine dependence, unspecified, uncomplicated: Secondary | ICD-10-CM | POA: Diagnosis not present

## 2017-03-20 DIAGNOSIS — K119 Disease of salivary gland, unspecified: Secondary | ICD-10-CM

## 2017-03-20 DIAGNOSIS — J449 Chronic obstructive pulmonary disease, unspecified: Secondary | ICD-10-CM

## 2017-03-20 DIAGNOSIS — G4733 Obstructive sleep apnea (adult) (pediatric): Secondary | ICD-10-CM | POA: Diagnosis not present

## 2017-03-20 DIAGNOSIS — R911 Solitary pulmonary nodule: Secondary | ICD-10-CM

## 2017-03-20 DIAGNOSIS — K118 Other diseases of salivary glands: Secondary | ICD-10-CM

## 2017-03-20 NOTE — Patient Instructions (Signed)
Order- office spirometry    Dx COPD mixed type  Order- CXR    Dx COPD mixed type  We can continue CPAP auto 10-20, mask of choice, humidifier, supplies, Please make sure he is on AirView     Dx OSA  If you need help with CPAP mask fitting please let us know.  I continue to encourage you to quit those cigarettes.  Don't put off following with Dr Constance Holster about the parotid mass

## 2017-03-20 NOTE — Progress Notes (Signed)
Patient ID: Dean [name], male    DOB: 23-Jun-1946, 71 y.o.   MRN: [id number]  HPI M  Smoker followed for COPD, obstructive sleep apnea, lung nodule, allergic rhinitis. NPSG 04/06/1999   AHI 87/hr, desaturation to 83%, body weight 185 pounds PFT- /04/2009-severe obstructive airways disease with insignificant response to bronchodilator. FVC 3.18/83%, FEV1 1.53/56%, ratio 0.48, TLC 86%, DLCO 51% Office Spirometry 03/20/2017-very severe obstructive airways disease. FVC 1.64/46%, FEV1 0.73/28%, ratio 0.45 ----------------------------------------------------------------------------------------------  09/12/2016-71 year old male smoker followed for COPD, OSA, lung nodule, allergic rhinitis CPAP auto 10-20/[facility] Being evaluated for parotid mass CXR 12/10/2015-COPD changes, no acute cardiopulmonary process. FOLLOWS FOR: DME: [facility]. Pt states he wears CPAP nightly-never goes to sleep/nap without it. No new supplies needed at this time.  Will need to place order to [place] for DL as pt does not have SD card in CPAP. He thinks his breathing is stable although he still smokes few cigarettes daily against advice. Chronic cough occasionally productive white sputum with no blood. Denies chest pain or palpitation. Easy dyspnea on exertion with brisk walk, hills or stairs. 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 Comfortable with CPAP 10-20 auto, new machine/[facility] Wants to wait on flu vaccine  03/20/2817-71 year old male smoker followed for COPD, OSA, lung nodule, allergic rhinitis FOLLOWS FOR:DME [facility]. Pt wears CPAP nightly but continues to wake up-gets good "grade" for usage but thinks its his back causing him trouble. No new supplies needed at this time. No DL and no tin AV. CPAP auto 10-20/[facility] Still smoking cigarettes and vapes. Counseling again. Admits some increased cough and shortness of breath which he blames on "pollen". Clear  sputum. Being followed by Dr. Olga Coaster for left neck/parotid mass which doesn't seem to be growing. CPAP download 100% 4 hour compliance, AHI 0.6/hour. Excellent compliance and control. CPAP mask that he likes is wearing out at [place] having trouble replacing it. Office Spirometry 03/20/2017-very severe obstructive airways disease. FVC 1.64/46%, FEV1 0.73/28%, ratio 0.45  ROS-see HPI Constitutional:   No-   weight loss, night sweats, fevers, chills, fatigue, lassitude. HEENT:   No-  headaches, difficulty swallowing, tooth/dental problems, sore throat,       No-  sneezing, itching, ear ache, +nasal congestion, post nasal drip,  CV:  No-   chest pain, orthopnea, PND, swelling in lower extremities, anasarca,  dizziness, palpitations Resp: + shortness of breath with exertion or at rest.              No-   productive cough,  + non-productive cough,  No- coughing up of blood.              No-   change in color of mucus.  + wheezing.   Skin: No-   rash or lesions. GI:  No-   heartburn, indigestion, abdominal pain, nausea, vomiting,  GU:  MS:  No-   joint pain or swelling, + back pain . Neuro-     nothing unusual Psych:  No- change in mood or affect. No depression or anxiety.  No memory loss.  Objective:  OBJ- Physical Exam General- Alert, Oriented, Affect-appropriate, Distress- none acute. + Odor of tobacco, + overweight Skin- rash-none, lesions- none, excoriation- none Lymphadenopathy- none Head- atraumatic            Eyes- Gross vision intact, PERRLA, conjunctivae and secretions clear            Ears- Hearing, canals-normal  Nose- + Turbinate edema, no-Septal dev, mucus, polyps, erosion, perforation             Throat- Mallampati II , mucosa clear , drainage- none, tonsils- atrophic Neck- flexible , trachea midline, no stridor , thyroid nl, carotid no bruit, + mass left lateral neck/cheek Chest - symmetrical excursion , unlabored           Heart/CV- RRR , no murmur , no gallop  ,  no rub, nl s1 s2                           - JVD- none , edema- none, stasis changes- none, varices- none           Lung- + diminished, wheeze- none,  + loose, dullness-none, rub- none           Chest wall-  Abd-  Br/ Gen/ Rectal- Not done, not indicated Extrem- cyanosis- none, clubbing, none, atrophy- none, strength- nl Neuro- grossly intact to observation

## 2017-03-26 DIAGNOSIS — K118 Other diseases of salivary glands: Secondary | ICD-10-CM | POA: Insufficient documentation

## 2017-03-26 NOTE — Assessment & Plan Note (Signed)
Progressively more severe with ongoing smoking. 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 I'm again counseling and offering support. He will need assessment for oxygen in the future.

## 2017-03-26 NOTE — Assessment & Plan Note (Signed)
Being followed by Dr. Constance Holster and plans to make appointment for next visit. Very concerning although apparently it seems stable. He understands that surgery would damage facial nerves.

## 2017-03-26 NOTE — Assessment & Plan Note (Signed)
Plan follow-up CXR

## 2017-03-26 NOTE — Assessment & Plan Note (Signed)
Encouraging tobacco smoker has resisted my efforts to help him quit.

## 2017-03-26 NOTE — Assessment & Plan Note (Signed)
Download confirms excellent compliance and control. We will try to help by getting mask fitting at the sleep [facility] if he can't get what he needs from [place].

## 2017-04-27 ENCOUNTER — Encounter: Payer: Self-pay | Admitting: Internal Medicine

## 2017-04-28 ENCOUNTER — Encounter: Payer: Self-pay | Admitting: Internal Medicine

## 2017-06-23 ENCOUNTER — Ambulatory Visit (AMBULATORY_SURGERY_CENTER): Payer: Self-pay | Admitting: *Deleted

## 2017-06-23 VITALS — Ht 65.0 in | Wt 197.2 lb

## 2017-06-23 DIAGNOSIS — Z86010 Personal history of colonic polyps: Secondary | ICD-10-CM

## 2017-06-23 MED ORDER — NA SULFATE-K SULFATE-MG SULF 17.5-3.13-1.6 GM/177ML PO SOLN: 1.0000 | Freq: Once | ORAL | 0 refills | Status: AC

## 2017-06-23 NOTE — Progress Notes (Signed)
No egg or soy allergy known to patient  No issues with past sedation with any surgeries  or procedures, no intubation problems  No diet pills per patient No home 02 use per patient  No blood thinners per patient  Pt denies issues with constipation  No A fib or A flutter- noted in med hx A Fib but pt denies this- has hx of PVC's  EMMI video sent to pt's e mail -- pt states he does not want this video

## 2017-06-26 ENCOUNTER — Encounter: Payer: Self-pay | Admitting: Internal Medicine

## 2017-07-10 ENCOUNTER — Encounter: Payer: Self-pay | Admitting: Internal Medicine

## 2017-07-10 ENCOUNTER — Ambulatory Visit (AMBULATORY_SURGERY_CENTER): Payer: Medicare Other | Admitting: Internal Medicine

## 2017-07-10 VITALS — BP 144/78 | HR 63 | Temp 96.2°F | Resp 15 | Ht 65.0 in | Wt 197.0 lb

## 2017-07-10 DIAGNOSIS — K621 Rectal polyp: Secondary | ICD-10-CM

## 2017-07-10 DIAGNOSIS — D124 Benign neoplasm of descending colon: Secondary | ICD-10-CM

## 2017-07-10 DIAGNOSIS — D123 Benign neoplasm of transverse colon: Secondary | ICD-10-CM

## 2017-07-10 DIAGNOSIS — K635 Polyp of colon: Secondary | ICD-10-CM | POA: Diagnosis not present

## 2017-07-10 DIAGNOSIS — Z86010 Personal history of colonic polyps: Secondary | ICD-10-CM | POA: Diagnosis not present

## 2017-07-10 DIAGNOSIS — D122 Benign neoplasm of ascending colon: Secondary | ICD-10-CM

## 2017-07-10 DIAGNOSIS — D128 Benign neoplasm of rectum: Secondary | ICD-10-CM

## 2017-07-10 MED ORDER — SODIUM CHLORIDE 0.9 % IV SOLN: 500.0000 mL | INTRAVENOUS | Status: AC

## 2017-07-10 NOTE — Progress Notes (Signed)
Report to PACU, RN, vss, BBS= Clear.  

## 2017-07-10 NOTE — Progress Notes (Signed)
Called to room to assist during endoscopic procedure.  Patient ID and intended procedure confirmed with present staff. Received instructions for my participation in the procedure from the performing physician.  

## 2017-07-10 NOTE — Patient Instructions (Signed)
YOU HAD AN ENDOSCOPIC PROCEDURE TODAY AT [facility]:   Refer to the procedure report that was given to you for any specific questions about what was found during the examination.  If the procedure report does not answer your questions, please call your gastroenterologist to clarify.  If you requested that your care partner not be given the details of your procedure findings, then the procedure report has been included in a sealed envelope for you to review at your convenience later.  YOU SHOULD EXPECT: Some feelings of bloating in the abdomen. Passage of more gas than usual.  Walking can help get rid of the air that was put into your GI tract during the procedure and reduce the bloating. If you had a lower endoscopy (such as a colonoscopy or flexible sigmoidoscopy) you may notice spotting of blood in your stool or on the toilet paper. If you underwent a bowel prep for your procedure, you may not have a normal bowel movement for a few days.  Please Note:  You might notice some irritation and congestion in your nose or some drainage.  This is from the oxygen used during your procedure.  There is no need for concern and it should clear up in a day or so.  SYMPTOMS TO REPORT IMMEDIATELY:   Following lower endoscopy (colonoscopy or flexible sigmoidoscopy):  Excessive amounts of blood in the stool  Significant tenderness or worsening of abdominal pains  Swelling of the abdomen that is new, acute  Fever of 100F or higher  For urgent or emergent issues, a gastroenterologist can be reached at any hour by calling [phone].   DIET:  We do recommend a small meal at first, but then you may proceed to your regular diet.  Drink plenty of fluids but you should avoid alcoholic beverages for 24 hours.  ACTIVITY:  You should plan to take it easy for the rest of today and you should NOT DRIVE or use heavy machinery until tomorrow (because of the sedation medicines used during the test).     FOLLOW UP: Our staff will call the number listed on your records the next business day following your procedure to check on you and address any questions or concerns that you may have regarding the information given to you following your procedure. If we do not reach you, we will leave a message.  However, if you are feeling well and you are not experiencing any problems, there is no need to return our call.  We will assume that you have returned to your regular daily activities without incident.  If any biopsies were taken you will be contacted by phone or by letter within the next 1-3 weeks.  Please call us at [phone] if you have not heard about the biopsies in 3 weeks.    SIGNATURES/CONFIDENTIALITY: You and/or your care partner have signed paperwork which will be entered into your electronic medical record.  These signatures attest to the fact that that the information above on your After Visit Summary has been reviewed and is understood.  Full responsibility of the confidentiality of this discharge information lies with you and/or your care-partner.    Handouts were given to your care partner on polyps, diverticulosis, hemorrhoids and a high fiber diet with liberal fluid intake. You may resume your current medications today. Await biopsy results. Please call if any questions or concerns.

## 2017-07-10 NOTE — Op Note (Signed)
[name]: Key Cen Procedure Date: 07/10/2017 1:13 PM MRN: [id number] Endoscopist: Docia Chuck. Dean [name] , MD Age: 71 Referring MD:  Date of Birth: 1946-04-29 Gender: Male Account #: [id number] Procedure:                Colonoscopy, with cold snare polypectomy X6 Indications:              High risk colon cancer surveillance: Personal                            history of multiple (3 or more) adenomas. Previous                            examinations 2003, 2006, 2013. In addition to                            adenomas, large hyperplastic polyps Medicines:                Monitored Anesthesia Care Procedure:                Pre-Anesthesia Assessment:                           - Prior to the procedure, a History and Physical                            was performed, and patient medications and                            allergies were reviewed. The patient's tolerance of                            previous anesthesia was also reviewed. The risks                            and benefits of the procedure and the sedation                            options and risks were discussed with the patient.                            All questions were answered, and informed consent                            was obtained. Prior Anticoagulants: The patient has                            taken no previous anticoagulant or antiplatelet                            agents. ASA Grade Assessment: II - A patient with                            mild systemic disease. After reviewing the risks  and benefits, the patient was deemed in                            satisfactory condition to undergo the procedure.                           After obtaining informed consent, the colonoscope                            was passed under direct vision. Throughout the                            procedure, the patient's blood pressure, pulse, and   oxygen saturations were monitored continuously. The                            Model CF-HQ190L [id number]) scope was introduced                            through the anus and advanced to the the cecum,                            identified by appendiceal orifice and ileocecal                            valve. The ileocecal valve, appendiceal orifice,                            and rectum were photographed. The quality of the                            bowel preparation was good. The colonoscopy was                            performed without difficulty. The patient tolerated                            the procedure well. The bowel preparation used was                            SUPREP. Scope In: 1:19:17 PM Scope Out: 1:48:33 PM Scope Withdrawal Time: 0 hours 25 minutes 5 seconds  Total Procedure Duration: 0 hours 29 minutes 16 seconds  Findings:                 Six polyps were found in the rectum, descending                            colon, transverse colon and ascending colon. The                            polyps were 3 to 5 mm in size. These polyps were  removed with a cold snare. Resection and retrieval                            were complete.                           Multiple diverticula were found in the transverse                            colon and left colon.                           Internal hemorrhoids were found during retroflexion.                           The exam was otherwise without abnormality on                            direct and retroflexion views. Complications:            No immediate complications. Estimated blood loss:                            None. Estimated Blood Loss:     Estimated blood loss: none. Impression:               - Six 3 to 5 mm polyps in the rectum, in the                            descending colon, in the transverse colon and in                            the ascending colon, removed with a cold snare.                             Resected and retrieved.                           - Diverticulosis in the transverse colon and in the                            left colon.                           - Internal hemorrhoids.                           - The examination was otherwise normal on direct                            and retroflexion views. Recommendation:           - Repeat colonoscopy in 3 - 5 years for                            surveillance.                           -  Patient has a contact number available for                            emergencies. The signs and symptoms of potential                            delayed complications were discussed with the                            patient. Return to normal activities tomorrow.                            Written discharge instructions were provided to the                            patient.                           - Resume previous diet.                           - Continue present medications.                           - Await pathology results.                           - Stop smoking Dean [name] N. Dean Pastor, MD 07/10/2017 1:54:36 PM This report has been signed electronically.

## 2017-07-10 NOTE — Progress Notes (Signed)
No problems noted in the recovery room. maw 

## 2017-07-11 ENCOUNTER — Telehealth: Payer: Self-pay | Admitting: *Deleted

## 2017-07-11 NOTE — Telephone Encounter (Signed)
No answer, message left for the patient. 

## 2017-07-13 ENCOUNTER — Encounter: Payer: Self-pay | Admitting: Internal Medicine

## 2017-07-26 ENCOUNTER — Other Ambulatory Visit: Payer: Self-pay | Admitting: Internal Medicine

## 2017-09-18 ENCOUNTER — Encounter: Payer: Self-pay | Admitting: Internal Medicine

## 2017-09-19 ENCOUNTER — Encounter: Payer: Self-pay | Admitting: Internal Medicine

## 2017-09-19 ENCOUNTER — Ambulatory Visit (INDEPENDENT_AMBULATORY_CARE_PROVIDER_SITE_OTHER): Payer: Medicare Other | Admitting: Internal Medicine

## 2017-09-19 VITALS — BP 128/74 | HR 57 | Ht 64.5 in | Wt 194.6 lb

## 2017-09-19 DIAGNOSIS — F17200 Nicotine dependence, unspecified, uncomplicated: Secondary | ICD-10-CM | POA: Diagnosis not present

## 2017-09-19 DIAGNOSIS — J449 Chronic obstructive pulmonary disease, unspecified: Secondary | ICD-10-CM

## 2017-09-19 DIAGNOSIS — Z23 Encounter for immunization: Secondary | ICD-10-CM

## 2017-09-19 DIAGNOSIS — J441 Chronic obstructive pulmonary disease with (acute) exacerbation: Secondary | ICD-10-CM | POA: Diagnosis not present

## 2017-09-19 DIAGNOSIS — G4733 Obstructive sleep apnea (adult) (pediatric): Secondary | ICD-10-CM | POA: Diagnosis not present

## 2017-09-19 MED ORDER — LEVALBUTEROL HCL 0.63 MG/3ML IN NEBU
0.6300 mg | INHALATION_SOLUTION | Freq: Once | RESPIRATORY_TRACT | Status: AC
  Administered 2017-09-19: 0.63 mg via RESPIRATORY_TRACT

## 2017-09-19 MED ORDER — COMPRESSOR NEBULIZER MISC: 1.0000 | Freq: Once | 0 refills | Status: AC

## 2017-09-19 MED ORDER — ALBUTEROL SULFATE (2.5 MG/3ML) 0.083% IN NEBU: 2.5000 mg | INHALATION_SOLUTION | Freq: Four times a day (QID) | RESPIRATORY_TRACT | 12 refills | Status: DC | PRN

## 2017-09-19 NOTE — Progress Notes (Signed)
Patient ID: Dean [name], male    DOB: [birth year], 71 y.o.   MRN: [id number]  HPI M  Smoker followed for COPD, obstructive sleep apnea, lung nodule, allergic rhinitis. NPSG 04/06/1999   AHI 87/hr, desaturation to 83%, body weight 185 pounds PFT- /04/2009-severe obstructive airways disease with insignificant response to bronchodilator. FVC 3.18/83%, FEV1 1.53/56%, ratio 0.48, TLC 86%, DLCO 51% Office Spirometry 03/20/2017-very severe obstructive airways disease. FVC 1.64/46%, FEV1 0.73/28%, ratio 0.45 ----------------------------------------------------------------------------------------------  03/20/2817-71 year old male smoker followed for COPD, OSA, lung nodule, allergic rhinitis FOLLOWS FOR:DME [employer]. Pt wears CPAP nightly but continues to wake up-gets good "grade" for usage but thinks its his back causing him trouble. No new supplies needed at this time. No DL and no tin AV. CPAP auto 10-20/[employer] Still smoking cigarettes and vapes. Counseling again. Admits some increased cough and shortness of breath which he blames on "pollen". Clear sputum. Being followed by Dr. Olga Coaster for left neck/parotid mass which doesn't seem to be growing. CPAP download 100% 4 hour compliance, AHI 0.6/hour. Excellent compliance and control. CPAP mask that he likes is wearing out at [place] having trouble replacing it. Office Spirometry 03/20/2017-very severe obstructive airways disease. FVC 1.64/46%, FEV1 0.73/28%, ratio 0.45  09/19/17- 71 year old male smoker followed for COPD, OSA, lung nodule, allergic rhinitis CPAP auto 10-20/[employer] FOLLOWS FOR: DME: [employer]. Pt wears CPAP nightly and DL attached.   CPAP download indicates 100% compliance with residual apnea AHI 0.4/hour indicating excellent control. He has been frustrated with bladder problems, working with urology. Some medicines cause rhinorrhea and nightmares,. Breathing has been stable, but he is obviously congested in his chest today. He is using rescue  inhaler twice daily. Coughs scant phlegm with white/yellow, no blood. Denies chest pain or palpitation. Doesn't remember impression from trial of Bevespi. Still smoking a few cigarettes against emphatic advice. CXR 03/20/17 IMPRESSION: COPD.  Chronic fibrotic changes, stable.  No pneumonia nor CHF. Thoracic aortic atherosclerosis.  ROS-see HPI  + = positive Constitutional:   No-   weight loss, night sweats, fevers, chills, fatigue, lassitude. HEENT:   No-  headaches, difficulty swallowing, tooth/dental problems, sore throat,       No-  sneezing, itching, ear ache, +nasal congestion, post nasal drip,  CV:  No-   chest pain, orthopnea, PND, swelling in lower extremities, anasarca,  dizziness, palpitations Resp: + shortness of breath with exertion or at rest.           +   productive cough,  + non-productive cough,  No- coughing up of blood.              No-   change in color of mucus.  + wheezing.   Skin: No-   rash or lesions. GI:  No-   heartburn, indigestion, abdominal pain, nausea, vomiting,  GU:  MS:  No-   joint pain or swelling, + back pain . Neuro-     nothing unusual Psych:  No- change in mood or affect. No depression or anxiety.  No memory loss.  Objective:  OBJ- Physical Exam General- Alert, Oriented, Affect-appropriate, Distress- none acute. + Odor of tobacco, + overweight Skin- rash-none, lesions- none, excoriation- none Lymphadenopathy- none Head- atraumatic            Eyes- Gross vision intact, PERRLA, conjunctivae and secretions clear            Ears- Hearing, canals-normal            Nose- + Turbinate edema, no-Septal dev, mucus, polyps,  erosion, perforation             Throat- Mallampati II , mucosa clear , drainage- none, tonsils- atrophic Neck- flexible , trachea midline, no stridor , thyroid nl, carotid no bruit, + mass left lateral neck/cheek Chest - symmetrical excursion , unlabored           Heart/CV- RRR , no murmur , no gallop  , no rub, nl s1 s2                            - JVD- none , edema- none, stasis changes- none, varices- none           Lung- + diminished, wheeze- none,  Cough + deeply congested, dullness-none, rub- none           Chest wall-  Abd-  Br/ Gen/ Rectal- Not done, not indicated Extrem- cyanosis- none, clubbing, none, atrophy- none, strength- nl Neuro- grossly intact to observation

## 2017-09-19 NOTE — Addendum Note (Signed)
Addended by: Lorane Gell on: 09/19/2017 03:32 PM   Modules accepted: Orders

## 2017-09-19 NOTE — Patient Instructions (Signed)
Flu vax- senior  Please keep trying to skip every cigarette you can- There's no room left for any at all.  Order- neb xop 0.63     Dx COPD exacerbation  Order- DME new nebulizer machine and albuterol     Scripts printed    Dx COPD mixed type

## 2017-09-19 NOTE — Assessment & Plan Note (Signed)
Download confirms excellent control and compliance. He uses it all night every night and doesn't want sleep without it. Pressure is appropriate. No changes needed.

## 2017-09-19 NOTE — Assessment & Plan Note (Signed)
I have tried to impress that he can't smoke at all. I think it is more habit than nicotine addiction at this point. Support was reinforced.

## 2017-09-19 NOTE — Assessment & Plan Note (Signed)
There is a significant bronchitic component but he does not seem to be actively infected. We will see how he responds to maintenance bronchodilator therapy using nebulizer machine. He doesn't feel he is getting adequate medication release through metered inhalers. If he doesn't clear we will update imaging. Plan-nebulizer treatments Xopenex today. Home nebulizer and prescription through DME for albuterol. Flu shot

## 2017-09-26 ENCOUNTER — Telehealth: Payer: Self-pay | Admitting: Internal Medicine

## 2017-09-26 MED ORDER — ALBUTEROL SULFATE (2.5 MG/3ML) 0.083% IN NEBU: 2.5000 mg | INHALATION_SOLUTION | Freq: Four times a day (QID) | RESPIRATORY_TRACT | 12 refills | Status: DC | PRN

## 2017-09-26 NOTE — Telephone Encounter (Signed)
Spoke with patient. He was seen by CY on 09/19/17 and was told that his albuterol RX would be sent in. Reviewed patient's chart and saw that it was printed. Patient does not have this RX. Advised patient that I could call in the RX for him. He verbalized understanding. Nothing else needed at time of call.

## 2017-11-23 ENCOUNTER — Telehealth: Payer: Self-pay | Admitting: *Deleted

## 2017-11-23 NOTE — Telephone Encounter (Signed)
   Primary Cardiologist:No primary care provider on file.  Chart reviewed as part of pre-operative protocol coverage. Because of Dean [name]'s past medical history and time since last visit, he/she will require a follow-up visit in order to better assess preoperative cardiovascular risk.  Pre-op covering staff: - Please schedule appointment and call patient to inform them. - Please contact requesting surgeon's office via preferred method (i.e, phone, fax) to inform them of need for appointment prior to surgery.  Cecilie Kicks, NP  11/23/2017, 3:52 PM

## 2017-11-23 NOTE — Telephone Encounter (Signed)
   [facility] Pre-operative Risk Assessment    Request for surgical clearance:  1. What type of surgery is being performed? Right total hip replacement   2. When is this surgery scheduled? Pending   3. Are there any medications that need to be held prior to surgery and how long?   4. Practice name and name of physician performing surgery? [facility], Dr. Davene Costain  5. What is your office phone and fax number? [phone], Fax: [phone]   6. Anesthesia type (None, local, MAC, general) ?   Last appt 02/06/17. Next appt 01/30/18 with Dean [name]. Dean [name] 11/23/2017, 3:15 PM  _________________________________________________________________   (provider comments below)

## 2017-11-23 NOTE — Telephone Encounter (Signed)
LMOVM TO CONTACT OFFICE BACK  FOR AN PRE OPERATIVE CLEARANCE APPT.

## 2017-11-23 NOTE — Telephone Encounter (Signed)
LMOVM TO CONTACT OFFICE BACK  FOR AN PRE OPERATIVE CLEARANCE APPT

## 2017-12-01 ENCOUNTER — Telehealth: Payer: Self-pay | Admitting: Internal Medicine

## 2017-12-01 NOTE — Telephone Encounter (Signed)
New message    Patient calling to inform staff, he does NOT need cardiac clearance at this time. Procedure has been postponed.

## 2017-12-01 NOTE — Telephone Encounter (Signed)
The patient called into triage to inform the office that he has canceled the surgery and no longer needs cardiac clearance.

## 2017-12-01 NOTE — Telephone Encounter (Signed)
Returned the call to the patient. He was calling to inform the office that he has canceled the procedure and does not need cardiac clearance at this time.

## 2017-12-01 NOTE — Telephone Encounter (Signed)
2nd attempt to reach pt re: cardiac clearance. Pt must make an appt before he can be cleared. Left a detailed message for pt.

## 2017-12-19 HISTORY — PX: EYE SURGERY: SHX253

## 2017-12-19 HISTORY — PX: CATARACT EXTRACTION W/ INTRAOCULAR LENS IMPLANT: SHX1309

## 2017-12-20 ENCOUNTER — Encounter (HOSPITAL_COMMUNITY): Payer: Medicare Other

## 2017-12-20 ENCOUNTER — Ambulatory Visit: Payer: Medicare Other | Admitting: Family

## 2017-12-21 ENCOUNTER — Ambulatory Visit: Payer: Medicare Other | Admitting: Family

## 2017-12-21 ENCOUNTER — Encounter (HOSPITAL_COMMUNITY): Payer: Medicare Other

## 2017-12-22 ENCOUNTER — Other Ambulatory Visit: Payer: Self-pay

## 2017-12-22 ENCOUNTER — Encounter: Payer: Self-pay | Admitting: Family

## 2017-12-22 ENCOUNTER — Ambulatory Visit (INDEPENDENT_AMBULATORY_CARE_PROVIDER_SITE_OTHER): Payer: Medicare Other | Admitting: Family

## 2017-12-22 ENCOUNTER — Ambulatory Visit (HOSPITAL_COMMUNITY)
Admission: RE | Admit: 2017-12-22 | Discharge: 2017-12-22 | Disposition: A | Discharge status: Home / Self Care | Payer: Medicare Other | Source: Ambulatory Visit | Attending: Family | Admitting: Family

## 2017-12-22 VITALS — BP 122/76 | HR 71 | Temp 98.5°F | Resp 16 | Ht 65.0 in | Wt 190.0 lb

## 2017-12-22 DIAGNOSIS — F17200 Nicotine dependence, unspecified, uncomplicated: Secondary | ICD-10-CM

## 2017-12-22 DIAGNOSIS — I745 Embolism and thrombosis of iliac artery: Secondary | ICD-10-CM | POA: Diagnosis not present

## 2017-12-22 DIAGNOSIS — I779 Disorder of arteries and arterioles, unspecified: Secondary | ICD-10-CM

## 2017-12-22 DIAGNOSIS — Z95828 Presence of other vascular implants and grafts: Secondary | ICD-10-CM | POA: Insufficient documentation

## 2017-12-22 NOTE — Progress Notes (Signed)
[facility] [place]   CC: Follow up peripheral artery occlusive disease  History of Present Illness Dean [name] is a 72 y.o. male patient of Dr. 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 He returns today for routine surveillance.   He does have significant back arthritic lumbar spine problems which apparently are not amenable to surgery. His biggest issue is his back pain. His low back gives out before his legs do. He denies non-healing wounds, denies rest pain. Pt denies any history of stroke or TIA.  He reports walking as little as possible as it aggravates his back pain and right hip pain which is worse; this is managed with analgesics that he states he tries not to take.  His cardiologist is Dr. Debara Pickett.   Pt Diabetic: No Pt smoker: smoker (1.5 ppd x 47 yrs)  Pt meds include: Statin :Yes ASA: Yes Other anticoagulants/antiplatelets: no     Past Medical History:  Diagnosis Date  . Allergy    pollen  . Arthritis   . Asthma   . Atrial fibrillation ([place])    no documentation in epic/ pt states has never been told has A Fib  . CAD (coronary artery disease)    s/p angioplasty and stenting x3 1994  . Cataract    small, mild  . COPD (chronic obstructive pulmonary disease) ([place])   . GERD (gastroesophageal reflux disease)   . Hyperlipidemia   . Hypertension   . Kidney stone   . Neuromuscular disorder ([place])    nerve issues with back   . Peripheral vascular disease ([place])   . PVC's (premature ventricular contractions)   . Sleep apnea    wears cpap    Social History Social History   Tobacco Use  . Smoking status: Current Every Day Smoker    Packs/day: 1.50    Years: 47.00    Pack years: 70.50    Types: Cigarettes  . Smokeless tobacco: Never Used  . Tobacco comment: use e cig as well- close to 2 packs a day 06-23-17  Substance Use Topics  . Alcohol use: Yes   Alcohol/week: 0.0 oz    Comment: rarely  . Drug use: No    Family History Family History  Problem Relation Age of Onset  . Other Father        ETOH  . Diabetes Father   . Hypertension Father   . Kidney cancer Mother   . Cancer Mother        renal  . Hyperlipidemia Mother   . Hypertension Mother   . Cancer Sister        breast  . Hyperlipidemia Sister   . Hypertension Sister   . Breast cancer Sister   . Hyperlipidemia Brother   . Hypertension Brother   . Prostate cancer Brother   . Colon cancer Neg Hx   . Colon polyps Neg Hx   . Esophageal cancer Neg Hx   . Rectal cancer Neg Hx   . Stomach cancer Neg Hx     Past Surgical History:  Procedure Laterality Date  . APPENDECTOMY    . CHOLECYSTECTOMY  1990s   Gall Bladder  . COLONOSCOPY    . CORONARY ANGIOPLASTY WITH STENT PLACEMENT  1994  . lumbar disectomy  2005   L3 - 4 - 5  . POLYPECTOMY    . PR VEIN BYPASS GRAFT,AORTO-FEM-POP  08/31/11   Right to  Left Fem-Fem  . TONSILLECTOMY    . UMBILICAL HERNIA REPAIR      Allergies  Allergen Reactions  . Penicillins Palpitations    REACTION: rapid pulse    Current Outpatient Medications  Medication Sig Dispense Refill  . albuterol (PROVENTIL HFA) 108 (90 BASE) MCG/ACT inhaler Inhale 1 puff into the lungs 2 (two) times daily.     Marland Kitchen albuterol (PROVENTIL) (2.5 MG/3ML) 0.083% nebulizer solution Take 3 mLs (2.5 mg total) by nebulization every 6 (six) hours as needed for wheezing or shortness of breath. 150 mL 12  . aspirin 325 MG buffered tablet Take 325 mg by mouth daily.      Marland Kitchen atorvastatin (LIPITOR) 10 MG tablet Take 10 mg by mouth daily.      Marland Kitchen DILT-XR 240 MG 24 hr capsule Take 1 capsule by mouth daily.  0  . DYMISTA 137-50 MCG/ACT SUSP INSTILL 1 TO 2 SPRAYS INTO EACH NOSTRIL AT BEDTIME 23 g 3  . finasteride (PROSCAR) 5 MG tablet Take 1 tablet by mouth daily.    . irbesartan-hydrochlorothiazide (AVALIDE) 150-12.5 MG tablet TAKE 1 TABLET BY MOUTH ONCE DAILY 30 tablet 6  .  isosorbide mononitrate (IMDUR) 60 MG 24 hr tablet Take 60 mg by mouth daily.      . traMADol (ULTRAM) 50 MG tablet Take 50 mg by mouth as needed.     . Vitamin D, Ergocalciferol, (DRISDOL) 50000 UNITS CAPS Take 1 tablet by mouth Once a week.     Current Facility-Administered Medications  Medication Dose Route Frequency Provider Last Rate Last Dose  . 0.9 %  sodium chloride infusion  500 mL Intravenous Continuous Irene Shipper, MD        ROS: See HPI for pertinent positives and negatives.   Physical Examination  Vitals:   12/22/17 1038  BP: 122/76  Pulse: 71  Resp: 16  Temp: 98.5 F (36.9 C)  TempSrc: Oral  SpO2: 92%  Weight: 190 lb (86.2 kg)  Height: 5\' 5"  (1.651 m)   Body mass index is 31.62 kg/m.  General: A&O x 3, WDWN, obese male. Gait: normal Eyes: PERRLA, Pulmonary: Respirations are non labored, CTAB, diminished breath sounds in all fields, without wheezes, rales, or rhonchi Cardiac: regular rhythm and rate, no detected murmur     Carotid Bruits Left Right   Negative Negative   Abdominal aortic pulse is not palpable Radial pulses: 2+ palpable and =   VASCULAR EXAM: Extremitieswithout ischemic changes, without Gangrene; without open wounds. 2+ palpable fem-fem bypass graft     LE Pulses LEFT RIGHT   FEMORAL Not Palpable(obese) not palpable (obese)    POPLITEAL not palpable  not palpable   POSTERIOR TIBIAL not palpable  not palpable    DORSALIS PEDIS  ANTERIOR TIBIAL not palpable  2+ palpable    Abdomen: soft, NT, no palpable masses, large panus. Skin: no rashes, no ulcers, no cellulitis. Musculoskeletal: no muscle wasting or atrophy.M/S 5/5 bilaterally. Neurologic: A&O X 3; Appropriate Affect; SENSATION:  normal; MOTOR FUNCTION: moving all extremities. CN 2-12 intact.     ASSESSMENT: Dean [name] is a 72 y.o. 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 He does not walk enough to elicit claudication symptoms due to radiculopathy which has worsened and pt is walking less. He has no signs of ischemia in his LE's.     His atherosclerotic risk factors include active smoking x 47 years, CAD, COPS, and  obesity. Fortunately he does not have DM; he takes a daily ASA and a statin.   DATA   ABI (Date: 12/22/2017):  R:   ABI: 0.72 (was 0.74 on 12-20-16),   PT: waveform morphology not documented  DP: waveform morphology not documented  TBI:  0.45 (was 0.48)  L:   ABI: 0.70 (was 0.81),   PT: waveform morphology not documented  DP: waveform morphology not documented  TBI: 0.50 (was 0.53)  Stable on the right, slight decline on the left LE, moderate disease bilaterally.   Fem-fem bypass graft duplex (12-20-16): Patent right to left femoral to femoral graft with no significant stenosis.    PLAN:  The patient was counseled re smoking cessation and given several free resources re smoking cessation. Daily seated leg exercises demonstrated and discussed.   Based on the patient's vascular studies and examination, pt will return to clinic in 1 year with ABI's.  I advised pt to notify us if he develops concerns re the circulation in his feet or legs.  I discussed in depth with the patient the nature of atherosclerosis, and emphasized the importance of maximal medical management including strict control of blood pressure, blood glucose, and lipid levels, obtaining regular exercise, and cessation of smoking.  The patient is aware that without maximal medical management the underlying atherosclerotic disease process will progress, limiting the benefit of any interventions.  The patient was given information about PAD  including signs, symptoms, treatment, what symptoms should prompt the patient to seek immediate medical care, and risk reduction measures to take.  Clemon Chambers, RN, MSN, FNP-C [facility] Phone: [phone]  Clinic MD: Chen/Cain  12/22/17 11:06 AM

## 2017-12-22 NOTE — Patient Instructions (Addendum)
Steps to Quit Smoking Smoking tobacco can be bad for your health. It can also affect almost every organ in your body. Smoking puts you and people around you at risk for many serious long-lasting (chronic) diseases. Quitting smoking is hard, but it is one of the best things that you can do for your health. It is never too late to quit. What are the benefits of quitting smoking? When you quit smoking, you lower your risk for getting serious diseases and conditions. They can include:  Lung cancer or lung disease.  Heart disease.  Stroke.  Heart attack.  Not being able to have children (infertility).  Weak bones (osteoporosis) and broken bones (fractures).  If you have coughing, wheezing, and shortness of breath, those symptoms may get better when you quit. You may also get sick less often. If you are pregnant, quitting smoking can help to lower your chances of having a baby of low birth weight. What can I do to help me quit smoking? Talk with your doctor about what can help you quit smoking. Some things you can do (strategies) include:  Quitting smoking totally, instead of slowly cutting back how much you smoke over a period of time.  Going to in-person counseling. You are more likely to quit if you go to many counseling sessions.  Using resources and support systems, such as: ? Online chats with a counselor. ? Phone quitlines. ? Printed self-help materials. ? Support groups or group counseling. ? Text messaging programs. ? Mobile phone apps or applications.  Taking medicines. Some of these medicines may have nicotine in them. If you are pregnant or breastfeeding, do not take any medicines to quit smoking unless your doctor says it is okay. Talk with your doctor about counseling or other things that can help you.  Talk with your doctor about using more than one strategy at the same time, such as taking medicines while you are also going to in-person counseling. This can help make  quitting easier. What things can I do to make it easier to quit? Quitting smoking might feel very hard at first, but there is a lot that you can do to make it easier. Take these steps:  Talk to your family and friends. Ask them to support and encourage you.  Call phone quitlines, reach out to support groups, or work with a counselor.  Ask people who smoke to not smoke around you.  Avoid places that make you want (trigger) to smoke, such as: ? Bars. ? Parties. ? Smoke-break areas at work.  Spend time with people who do not smoke.  Lower the stress in your life. Stress can make you want to smoke. Try these things to help your stress: ? Getting regular exercise. ? Deep-breathing exercises. ? Yoga. ? Meditating. ? Doing a body scan. To do this, close your eyes, focus on one area of your body at a time from head to toe, and notice which parts of your body are tense. Try to relax the muscles in those areas.  Download or buy apps on your mobile phone or tablet that can help you stick to your quit plan. There are many free apps, such as QuitGuide from the CDC (Centers for Disease Control and Prevention). You can find more support from smokefree.gov and other websites.  This information is not intended to replace advice given to you by your health care provider. Make sure you discuss any questions you have with your health care provider. Document Released: 10/01/2009 Document   Revised: 08/02/2016 Document Reviewed: 04/21/2015 Elsevier Interactive Patient Education  2018 Elsevier Inc.     Peripheral Vascular Disease Peripheral vascular disease (PVD) is a disease of the blood vessels that are not part of your heart and brain. A simple term for PVD is poor circulation. In most cases, PVD narrows the blood vessels that carry blood from your heart to the rest of your body. This can result in a decreased supply of blood to your arms, legs, and internal organs, like your stomach or kidneys.  However, it most often affects a person's lower legs and feet. There are two types of PVD.  Organic PVD. This is the more common type. It is caused by damage to the structure of blood vessels.  Functional PVD. This is caused by conditions that make blood vessels contract and tighten (spasm).  Without treatment, PVD tends to get worse over time. PVD can also lead to acute ischemic limb. This is when an arm or limb suddenly has trouble getting enough blood. This is a medical emergency. Follow these instructions at home:  Take medicines only as told by your doctor.  Do not use any tobacco products, including cigarettes, chewing tobacco, or electronic cigarettes. If you need help quitting, ask your doctor.  Lose weight if you are overweight, and maintain a healthy weight as told by your doctor.  Eat a diet that is low in fat and cholesterol. If you need help, ask your doctor.  Exercise regularly. Ask your doctor for some good activities for you.  Take good care of your feet. ? Wear comfortable shoes that fit well. ? Check your feet often for any cuts or sores. Contact a doctor if:  You have cramps in your legs while walking.  You have leg pain when you are at rest.  You have coldness in a leg or foot.  Your skin changes.  You are unable to get or have an erection (erectile dysfunction).  You have cuts or sores on your feet that are not healing. Get help right away if:  Your arm or leg turns cold and blue.  Your arms or legs become red, warm, swollen, painful, or numb.  You have chest pain or trouble breathing.  You suddenly have weakness in your face, arm, or leg.  You become very confused or you cannot speak.  You suddenly have a very bad headache.  You suddenly cannot see. This information is not intended to replace advice given to you by your health care provider. Make sure you discuss any questions you have with your health care provider. Document Released:  03/01/2010 Document Revised: 05/12/2016 Document Reviewed: 05/15/2014 Elsevier Interactive Patient Education  2017 Elsevier Inc.  

## 2017-12-24 ENCOUNTER — Encounter: Payer: Self-pay | Admitting: Family

## 2017-12-25 NOTE — Addendum Note (Signed)
Addended by: Lianne Cure A on: 12/25/2017 11:42 AM   Modules accepted: Orders

## 2018-01-03 ENCOUNTER — Telehealth: Payer: Self-pay

## 2018-01-03 NOTE — Telephone Encounter (Signed)
   [facility] Pre-operative Risk Assessment    Request for surgical clearance:  1. What type of surgery is being performed?  RIGHT TOTAL HIP REPLACEMENT  2. When is this surgery scheduled?   PENDING  3. Are there any medications that need to be held prior to surgery and how long? NOT LISTED   4. Practice name and name of physician performing surgery? [place] ATTN:SHERRI   5. What is your office phone and fax number? [phone]   6. Anesthesia type (None, local, MAC, general) ? NOT LISTED  ALSO NOTED:  PLEASE FAX RECENT NOTES, LABS, EKG OR SPECIAL STUDIES WITH CLEARANCE.   Waylan Rocher 01/03/2018, 9:54 AM  _________________________________________________________________   (provider comments below)

## 2018-01-03 NOTE — Telephone Encounter (Signed)
I called and left message for the pt to confirm if he was having hip surgery or not having hip surgery. See phone notes from 11/23/17 where pt states he has cancelled hip surgery. There is another message from today from Veronda Prude, LPN asking for clearance.

## 2018-01-03 NOTE — Telephone Encounter (Signed)
Per previous conversation, as below, pt has been scheduled for an office visit 01/2018.  Please forward note with clearance to surgeon.       Primary Cardiologist:No primary care provider on file.  Chart reviewed as part of pre-operative protocol coverage. Because of Dean [name]'s past medical history and time since last visit, he/she will require a follow-up visit in order to better assess preoperative cardiovascular risk.  Pre-op covering staff: - Please schedule appointment and call patient to inform them. - Please contact requesting surgeon's office via preferred method (i.e, phone, fax) to inform them of need for appointment prior to surgery.  Cecilie Kicks, NP  11/23/2017, 3:52 PM           Electronically signed by Isaiah Serge, NP at 11/23/2017 3:53 PM

## 2018-01-04 NOTE — Telephone Encounter (Signed)
Spoke with patient he states his surgery was canceled and he would reschedule after his next appointment with Dr Debara Pickett (01/30/18). Patient voiced understanding.

## 2018-01-18 ENCOUNTER — Other Ambulatory Visit: Payer: Self-pay | Admitting: Internal Medicine

## 2018-01-19 NOTE — Telephone Encounter (Signed)
REFILL 

## 2018-01-24 ENCOUNTER — Encounter: Payer: Self-pay | Admitting: Internal Medicine

## 2018-01-25 ENCOUNTER — Encounter: Payer: Self-pay | Admitting: Internal Medicine

## 2018-01-25 ENCOUNTER — Ambulatory Visit (INDEPENDENT_AMBULATORY_CARE_PROVIDER_SITE_OTHER)
Admission: RE | Admit: 2018-01-25 | Discharge: 2018-01-25 | Disposition: A | Discharge status: Home / Self Care | Payer: Medicare Other | Source: Ambulatory Visit | Attending: Internal Medicine | Admitting: Internal Medicine

## 2018-01-25 ENCOUNTER — Ambulatory Visit: Payer: Medicare Other | Admitting: Internal Medicine

## 2018-01-25 VITALS — BP 126/80 | HR 66 | Ht 64.5 in | Wt 193.8 lb

## 2018-01-25 DIAGNOSIS — J449 Chronic obstructive pulmonary disease, unspecified: Secondary | ICD-10-CM

## 2018-01-25 DIAGNOSIS — G4733 Obstructive sleep apnea (adult) (pediatric): Secondary | ICD-10-CM | POA: Diagnosis not present

## 2018-01-25 DIAGNOSIS — Z23 Encounter for immunization: Secondary | ICD-10-CM

## 2018-01-25 NOTE — Progress Notes (Signed)
Patient ID: Dean [name], male    DOB: 02-20-1946, 71 y.o.   MRN: [id number]  HPI M  Smoker followed for COPD, obstructive sleep apnea, lung nodule, allergic rhinitis. NPSG 04/06/1999   AHI 87/hr, desaturation to 83%, body weight 185 pounds PFT- /04/2009-severe obstructive airways disease with insignificant response to bronchodilator. FVC 3.18/83%, FEV1 1.53/56%, ratio 0.48, TLC 86%, DLCO 51% Office Spirometry 03/20/2017-very severe obstructive airways disease. FVC 1.64/46%, FEV1 0.73/28%, ratio 0.45 ----------------------------------------------------------------------------------------------  09/19/17- 72 year old male smoker followed for COPD, OSA, lung nodule, allergic rhinitis CPAP auto 10-20/[employer] FOLLOWS FOR: DME: [employer]. Pt wears CPAP nightly and DL attached.   CPAP download indicates 100% compliance with residual apnea AHI 0.4/hour indicating excellent control. He has been frustrated with bladder problems, working with urology. Some medicines cause rhinorrhea and nightmares,. Breathing has been stable, but he is obviously congested in his chest today. He is using rescue inhaler twice daily. Coughs scant phlegm with white/yellow, no blood. Denies chest pain or palpitation. Doesn't remember impression from trial of Bevespi. Still smoking a few cigarettes against emphatic advice. CXR 03/20/17 IMPRESSION: COPD.  Chronic fibrotic changes, stable.  No pneumonia nor CHF. Thoracic aortic atherosclerosis.  01/25/18- 72 year old male smoker followed for COPD/ , OSA, lung nodule, allergic rhinitis CPAP auto 10-20/[employer] ----OSA; DZH:[facility] Pt wears CPAP nightly and DL attached. No new supplies needed. Office Spirometry 03/20/17-very severe obstructive airways disease-FVC 1.64/46%, FEV1 0.73/28%, ratio 0.45, FEF 25-75% 0.23 Continues to smoke against advice.  No recent infection or exacerbation. Likes his albuterol nebulizer machine. Bladder obstruction problems so we are avoiding LAMAs.  ROS-see  HPI  + = positive Constitutional:   No-   weight loss, night sweats, fevers, chills, fatigue, lassitude. HEENT:   No-  headaches, difficulty swallowing, tooth/dental problems, sore throat,       No-  sneezing, itching, ear ache, +nasal congestion, post nasal drip,  CV:  No-   chest pain, orthopnea, PND, swelling in lower extremities, anasarca,  dizziness, palpitations Resp: + shortness of breath with exertion or at rest.           +   productive cough,  + non-productive cough,  No- coughing up of blood.              No-   change in color of mucus.  + wheezing.   Skin: No-   rash or lesions. GI:  No-   heartburn, indigestion, abdominal pain, nausea, vomiting,  GU:  MS:  No-   joint pain or swelling, + back pain . Neuro-     nothing unusual Psych:  No- change in mood or affect. No depression or anxiety.  No memory loss.  Objective:  OBJ- Physical Exam General- Alert, Oriented, Affect-appropriate, Distress- none acute. + Odor of tobacco, + overweight Skin- rash-none, lesions- none, excoriation- none Lymphadenopathy- none Head- atraumatic            Eyes- Gross vision intact, PERRLA, conjunctivae and secretions clear            Ears- Hearing, canals-normal            Nose- + Turbinate edema, no-Septal dev, mucus, polyps, erosion, perforation             Throat- Mallampati II , mucosa clear , drainage- none, tonsils- atrophic Neck- flexible , trachea midline, no stridor , thyroid nl, carotid no bruit, + mass left lateral neck/cheek Chest - symmetrical excursion , unlabored  Heart/CV- RRR , no murmur , no gallop  , no rub, nl s1 s2                           - JVD- none , edema- none, stasis changes- none, varices- none           Lung- + diminished, wheeze- none,  Cough none, dullness-none, rub- none           Chest wall-  Abd-  Br/ Gen/ Rectal- Not done, not indicated Extrem- cyanosis- none, clubbing, none, atrophy- none, strength- nl + Cane Neuro- grossly intact to  observation

## 2018-01-25 NOTE — Patient Instructions (Signed)
Please keep trying to skip cigarettes and work toward quitting.  Order- Prevnar 13 pneumonia vaccine  Order- CXR dx COPD mixed type  Please call as needed

## 2018-01-28 NOTE — Assessment & Plan Note (Signed)
Download again confirms 100% compliance, excellent control with AHI 1.0/hour.  He does not want to sleep without CPAP because it makes him sleep better.

## 2018-01-28 NOTE — Assessment & Plan Note (Addendum)
Medications are appropriate and he has had no recent exacerbation.  He will be at increased pulmonary risk if he requires general anesthesia and hip replacement. Plan-Prevnar 13 pneumonia vaccine, update CXR

## 2018-01-30 ENCOUNTER — Ambulatory Visit: Payer: Medicare Other | Admitting: Internal Medicine

## 2018-01-30 ENCOUNTER — Encounter: Payer: Self-pay | Admitting: Internal Medicine

## 2018-01-30 VITALS — BP 158/66 | HR 59 | Ht 65.0 in | Wt 192.5 lb

## 2018-01-30 DIAGNOSIS — I2510 Atherosclerotic heart disease of native coronary artery without angina pectoris: Secondary | ICD-10-CM

## 2018-01-30 DIAGNOSIS — I10 Essential (primary) hypertension: Secondary | ICD-10-CM

## 2018-01-30 DIAGNOSIS — I779 Disorder of arteries and arterioles, unspecified: Secondary | ICD-10-CM

## 2018-01-30 DIAGNOSIS — J4489 Other specified chronic obstructive pulmonary disease: Secondary | ICD-10-CM | POA: Insufficient documentation

## 2018-01-30 DIAGNOSIS — F17200 Nicotine dependence, unspecified, uncomplicated: Secondary | ICD-10-CM

## 2018-01-30 DIAGNOSIS — J449 Chronic obstructive pulmonary disease, unspecified: Secondary | ICD-10-CM

## 2018-01-30 NOTE — Progress Notes (Signed)
OFFICE NOTE  Chief Complaint:  Right hip pain  Primary Care Physician: [name], MD  HPI:  Dean [name] is a pleasant 72 year old male who is currently referred to me for evaluation of abnormal EKG. Recently he's had some worsening shortness of breath. He also significant back problems and other medical issues. He is a long-standing smoker with a history of hypertension, dyslipidemia and PAD. He recently had a left femoral-femoral bypass for occluded distal aorta. He had a routine physical performed which demonstrated an abnormal EKG done which showed inferior ST depression and PVCs. This is apparently a new finding. He denies any anginal complaints. He is not particularly active due to problems with his back and peripheral arterial disease.  I saw Dean [name] back today For follow-up of his stress test. This is negative for ischemia and showed a small fixed defect which is likely artifact, however scar cannot be excluded as it was a non-gated study. This is somewhat reassuring suggesting that shortness of breath may be more pulmonary in nature. He has reported more persistent, productive cough. He also has a dry cough. He's wondering if this could be related to his lisinopril. He's also noted that his blood pressure is not as well-controlled as it had been in the past with recent increases in diastolic pressure.   Dean [name] returns today for follow-up. He denies any chest pain or worsening shortness of breath. He told me on New Year's Eve he came down with the flu and had cough for about a month. Eventually he has improved. He continues to have some PVCs bits asymptomatic with this. He denies any claudication symptoms but does have history of femoral femoral bypass grafting. He is followed by vascular surgery for this. Cholesterol is followed by his primary care provider.  02/06/2017  Dean [name] returns today for follow-up. Since I last saw him he feels like he is doing fairly  well. Unfortunately he is not able to stop smoking due to significant stresses in his life. This is despite having chronic lung disease, PAD and coronary artery disease. He's not had any upper respiratory infections this year. He is followed by vascular surgery due to his history of femoral bypass grafting. He denies any chest pain. EKG shows sinus bradycardia with nonspecific interventricular conduction delay. Blood pressure is well-controlled today.  01/30/2018  Dean [name] returns today for follow-up.  I last saw him about a year ago.  Unfortunately continues to smoke.  Was recently seen by his pulmonologist to noted that he has severe COPD.  He has been struggling with low back pain as well as right hip pain.  He is received 2 injections of the right hip and still has a gnawing pain.  Apparently he has no cartilage left in the joint and is possibly facing hip replacement.  He is being cared for by Dr. Mardelle Matte.  Blood pressure is mildly elevated today.  He denies any chest pain or worsening shortness of breath.  Recent labs in November 2018 showed total cholesterol 104, HDL 32, LDL 53 and triglycerides 96.  Hemoglobin A1c of 7.1 and serum creatinine 1.2.  PMHx:  Past Medical History:  Diagnosis Date  . Allergy    pollen  . Arthritis   . Asthma   . Atrial fibrillation ([place])    no documentation in epic/ pt states has never been told has A Fib  . CAD (coronary artery disease)    s/p angioplasty and stenting x3 1994  .  Cataract    small, mild  . COPD (chronic obstructive pulmonary disease) ([place])   . GERD (gastroesophageal reflux disease)   . Hyperlipidemia   . Hypertension   . Kidney stone   . Neuromuscular disorder ([place])    nerve issues with back   . Peripheral vascular disease ([place])   . PVC's (premature ventricular contractions)   . Sleep apnea    wears cpap    Past Surgical History:  Procedure Laterality Date  . APPENDECTOMY    . CHOLECYSTECTOMY  1990s   Gall Bladder  .  COLONOSCOPY    . CORONARY ANGIOPLASTY WITH STENT PLACEMENT  1994  . lumbar disectomy  2005   L3 - 4 - 5  . POLYPECTOMY    . PR VEIN BYPASS GRAFT,AORTO-FEM-POP  08/31/11   Right to Left Fem-Fem  . TONSILLECTOMY    . UMBILICAL HERNIA REPAIR      FAMHx:  Family History  Problem Relation Age of Onset  . Other Father        ETOH  . Diabetes Father   . Hypertension Father   . Kidney cancer Mother   . Cancer Mother        renal  . Hyperlipidemia Mother   . Hypertension Mother   . Cancer Sister        breast  . Hyperlipidemia Sister   . Hypertension Sister   . Breast cancer Sister   . Hyperlipidemia Brother   . Hypertension Brother   . Prostate cancer Brother   . Colon cancer Neg Hx   . Colon polyps Neg Hx   . Esophageal cancer Neg Hx   . Rectal cancer Neg Hx   . Stomach cancer Neg Hx     SOCHx:   reports that he has been smoking cigarettes.  He has a 70.50 pack-year smoking history. he has never used smokeless tobacco. He reports that he drinks alcohol. He reports that he does not use drugs.  ALLERGIES:  Allergies  Allergen Reactions  . Penicillins Palpitations    REACTION: rapid pulse    ROS: Pertinent items noted in HPI and remainder of comprehensive ROS otherwise negative.  HOME MEDS: Current Outpatient Medications  Medication Sig Dispense Refill  . albuterol (PROVENTIL HFA) 108 (90 BASE) MCG/ACT inhaler Inhale 1 puff into the lungs 2 (two) times daily.     Marland Kitchen albuterol (PROVENTIL) (2.5 MG/3ML) 0.083% nebulizer solution Take 3 mLs (2.5 mg total) by nebulization every 6 (six) hours as needed for wheezing or shortness of breath. 150 mL 12  . aspirin 325 MG buffered tablet Take 325 mg by mouth daily.      Marland Kitchen atorvastatin (LIPITOR) 10 MG tablet Take 10 mg by mouth daily.      . baclofen (LIORESAL) 10 MG tablet TK 1 T PO BID PRF SPASMS  0  . DILT-XR 240 MG 24 hr capsule Take 1 capsule by mouth daily.  0  . DYMISTA 137-50 MCG/ACT SUSP INSTILL 1 TO 2 SPRAYS INTO EACH  NOSTRIL AT BEDTIME 23 g 3  . finasteride (PROSCAR) 5 MG tablet Take 1 tablet by mouth daily.    . irbesartan-hydrochlorothiazide (AVALIDE) 150-12.5 MG tablet Take 1 tablet by mouth daily. KEEP OV. 30 tablet 1  . isosorbide mononitrate (IMDUR) 60 MG 24 hr tablet Take 60 mg by mouth daily.      . traMADol (ULTRAM) 50 MG tablet Take 50 mg by mouth as needed.     . Vitamin D, Ergocalciferol, (DRISDOL) 50000 UNITS  CAPS Take 1 tablet by mouth Once a week.     Current Facility-Administered Medications  Medication Dose Route Frequency Provider Last Rate Last Dose  . 0.9 %  sodium chloride infusion  500 mL Intravenous Continuous Irene Shipper, MD        LABS/IMAGING: No results found for this or any previous visit (from the past 48 hour(s)). No results found.  VITALS: BP (!) 158/66   Pulse (!) 59   Ht 5\' 5"  (1.651 m)   Wt 192 lb 8 oz (87.3 kg)   BMI 32.03 kg/m   EXAM: General appearance: alert and no distress Neck: no carotid bruit and no JVD Lungs: rhonchi bilaterally Heart: regular rate and rhythm and occasional missed beats Abdomen: soft, non-tender; bowel sounds normal; no masses,  no organomegaly Extremities: extremities normal, atraumatic, no cyanosis or edema Pulses: 2+ and symmetric Skin: Skin color, texture, turgor normal. No rashes or lesions Neurologic: Grossly normal Psych: Pleasant  EKG: Sinus bradycardia at 59, nonspecific IVCD personally reviewed  ASSESSMENT: 1. CAD status post PCI 3 in 1994 2. Abnormal EKG with ischemic findings - negative nuclear stress test (2014) 3. PVCs 4. Peripheral arterial disease status post left femorofemoral bypass 5. Hypertension 6. Dyslipidemia 7. Smoking 8. Obesity 9.  COPD  PLAN: 1.   Dean [name] denies any active anginal symptoms.  His PCI was in 1994 and had a negative nuclear stress test in 2014, however he said it was a very uncomfortable study and says he never really wants to have another test like that again.  He is  however contemplating hip surgery and it is difficult to risk stratify him given the fact that he is not active and is short of breath mostly due to his severe COPD with minimal activity.  Although he is not having angina, I could not exclude an ischemic cause of his symptoms.  He will discuss with his orthopedist further about surgery and when the time comes we can further discuss his options.  He may just want to accept his risk if he declines stress testing and go ahead with the surgery at his own risk.  Follow-up with me annually or sooner as necessary.  Pixie Casino, MD, [facility], [place] Director of the [facility] Lipid Disorders &  Cardiovascular Risk Reduction Clinic Diplomate of the American Board of Clinical Lipidology Attending Cardiologist  Direct Dial: [phone]  Fax: [phone]  Website:  [email or web address] Monterius Rolf 01/30/2018, 1:09 PM

## 2018-01-30 NOTE — Patient Instructions (Addendum)
Your physician wants you to follow-up in: ONE YEAR with Dr. Hilty. You will receive a reminder letter in the mail two months in advance. If you don't receive a letter, please call our office to schedule the follow-up appointment.  

## 2018-02-26 ENCOUNTER — Other Ambulatory Visit: Payer: Self-pay | Admitting: Internal Medicine

## 2018-03-09 ENCOUNTER — Other Ambulatory Visit: Payer: Self-pay | Admitting: Internal Medicine

## 2018-06-06 ENCOUNTER — Encounter: Payer: Self-pay | Admitting: Podiatry

## 2018-06-06 ENCOUNTER — Ambulatory Visit: Payer: Medicare Other | Admitting: Podiatry

## 2018-06-06 DIAGNOSIS — M79676 Pain in unspecified toe(s): Secondary | ICD-10-CM | POA: Diagnosis not present

## 2018-06-06 DIAGNOSIS — B351 Tinea unguium: Secondary | ICD-10-CM

## 2018-06-11 ENCOUNTER — Ambulatory Visit: Payer: Self-pay

## 2018-06-11 DIAGNOSIS — B351 Tinea unguium: Secondary | ICD-10-CM

## 2018-06-11 DIAGNOSIS — M79676 Pain in unspecified toe(s): Principal | ICD-10-CM

## 2018-06-13 NOTE — Progress Notes (Signed)
   SUBJECTIVE Patient presents to office today complaining of elongated, thickened nails that cause pain while ambulating in shoes. He is concerned for nail fungus. He is unable to trim his own nails. Patient is here for further evaluation and treatment.  Past Medical History:  Diagnosis Date  . Allergy    pollen  . Arthritis   . Asthma   . Atrial fibrillation ([place])    no documentation in epic/ pt states has never been told has A Fib  . CAD (coronary artery disease)    s/p angioplasty and stenting x3 1994  . Cataract    small, mild  . COPD (chronic obstructive pulmonary disease) ([place])   . GERD (gastroesophageal reflux disease)   . Hyperlipidemia   . Hypertension   . Kidney stone   . Neuromuscular disorder ([place])    nerve issues with back   . Peripheral vascular disease ([place])   . PVC's (premature ventricular contractions)   . Sleep apnea    wears cpap    OBJECTIVE General Patient is awake, alert, and oriented x 3 and in no acute distress. Derm Skin is dry and supple bilateral. Negative open lesions or macerations. Remaining integument unremarkable. Nails are tender, long, thickened and dystrophic with subungual debris, consistent with onychomycosis, 1-5 bilateral. No signs of infection noted. Vasc  DP and PT pedal pulses palpable bilaterally. Temperature gradient within normal limits.  Neuro Epicritic and protective threshold sensation grossly intact bilaterally.  Musculoskeletal Exam No symptomatic pedal deformities noted bilateral. Muscular strength within normal limits.  ASSESSMENT 1. Onychodystrophic nails 1-5 bilateral with hyperkeratosis of nails.  2. Onychomycosis of nail due to dermatophyte bilateral 3. Pain in foot bilateral  PLAN OF CARE 1. Patient evaluated today.  2. Instructed to maintain good pedal hygiene and foot care.  3. Mechanical debridement of nails 1-5 bilaterally performed using a nail nipper. Filed with dremel without incident.  4. Appointment with  Janett Billow for laser treatment.  5. Return to clinic as needed.    Edrick Kins, DPM [facility]  Dr. Edrick Kins, [address]                                        [place], [place] [address]                Office [phone]  Fax [phone]

## 2018-06-13 NOTE — Progress Notes (Signed)
Pt presents with mycotic infection of nails 1-5 bilateral  All other systems are negative  Laser therapy administered to affected nails and tolerated well. All safety precautions were in place. RE-appointed in 4 weeks for 2nd treatment 

## 2018-07-09 ENCOUNTER — Other Ambulatory Visit: Payer: Medicare Other

## 2018-07-24 ENCOUNTER — Encounter: Payer: Self-pay | Admitting: Internal Medicine

## 2018-07-24 ENCOUNTER — Other Ambulatory Visit: Payer: Medicare Other

## 2018-07-25 ENCOUNTER — Encounter: Payer: Self-pay | Admitting: Internal Medicine

## 2018-07-25 ENCOUNTER — Ambulatory Visit: Payer: Medicare Other | Admitting: Internal Medicine

## 2018-07-25 VITALS — BP 122/70 | HR 67 | Ht 64.5 in | Wt 194.2 lb

## 2018-07-25 DIAGNOSIS — R911 Solitary pulmonary nodule: Secondary | ICD-10-CM | POA: Diagnosis not present

## 2018-07-25 DIAGNOSIS — G4733 Obstructive sleep apnea (adult) (pediatric): Secondary | ICD-10-CM

## 2018-07-25 DIAGNOSIS — F17200 Nicotine dependence, unspecified, uncomplicated: Secondary | ICD-10-CM

## 2018-07-25 DIAGNOSIS — J449 Chronic obstructive pulmonary disease, unspecified: Secondary | ICD-10-CM

## 2018-07-25 NOTE — Patient Instructions (Signed)
Script sent for Zpak  Order- DME [facility]- please replace mask of choice, and supplies. Add gel gasket if available,. Continue CPAP auto 10-20, mask of choice, supplies, humidifier, AirView  Please try hard to stop smoking- ok to use otc nicotine patches or any other measures

## 2018-07-25 NOTE — Assessment & Plan Note (Addendum)
Far advanced COPD.  Unfortunately he is not stopping smoking despite my discussion, efforts and offers of support at each visit. Plan-continue current meds.

## 2018-07-25 NOTE — Assessment & Plan Note (Signed)
CXR 01/25/2018 shows changes of COPD and scarring but no active process and no significant nodule. Plan-occasional CXR as otherwise indicated.

## 2018-07-25 NOTE — Assessment & Plan Note (Signed)
He definitely continues to benefit from CPAP with improved sleep.  Download confirms excellent compliance and control. Plan-replace supplies.  Discussed getting a different mask since his preferred style is no longer available.  He may need a gel gasket for better seal.  Continue auto 10-20.

## 2018-07-25 NOTE — Assessment & Plan Note (Signed)
End-stage lung and vascular disease.  I continue to press this issue with him.

## 2018-07-25 NOTE — Progress Notes (Signed)
Patient ID: Dean [name], male    DOB: 08-26-1946, 72 y.o.   MRN: [id number]  HPI M  Smoker followed for COPD, obstructive sleep apnea, lung nodule, allergic rhinitis. NPSG 04/06/1999   AHI 87/hr, desaturation to 83%, body weight 185 pounds PFT- /04/2009-severe obstructive airways disease with insignificant response to bronchodilator. FVC 3.18/83%, FEV1 1.53/56%, ratio 0.48, TLC 86%, DLCO 51% Office Spirometry 03/20/2017-very severe obstructive airways disease. FVC 1.64/46%, FEV1 0.73/28%, ratio 0.45 ----------------------------------------------------------------------------------------------  01/25/18- 72 year old male smoker followed for COPD/ , OSA, lung nodule, allergic rhinitis CPAP auto 10-20/[facility] ----OSA; XLK:GMWNU Pt wears CPAP nightly and DL attached. No new supplies needed. Office Spirometry 03/20/17-very severe obstructive airways disease-FVC 1.64/46%, FEV1 0.73/28%, ratio 0.45, FEF 25-75% 0.23 Continues to smoke against advice.  No recent infection or exacerbation. Likes his albuterol nebulizer machine. Bladder obstruction problems so we are avoiding LAMAs.  07/25/2018- 72 year old male smoker followed for COPD , OSA, lung nodule, allergic rhinitis CPAP auto 10-20/[facility] -----OSA: DME [facility] Pt wears CPAP nightly and DL attached. New supplies needed as well. Pt denies any breathing issues.  Still smoking 2 packs a day against advice and efforts.  Discussed again. Dymsita, albuterol hfa, neb albuterol,  Download compliance 100% AHI 0.3/hour.  He does not want to sleep without CPAP. Breathing feels stable.  Inhaled medicines do help. Questions developing sinus infection with some frontal headache and green nasal discharge. CXR 01/25/18 Hyperaeration consistent with emphysema. Bibasilar scarring or atelectasis. No active process.  ROS-see HPI  + = positive Constitutional:   No-   weight loss, night sweats, fevers, chills, fatigue, lassitude. HEENT:   No-  headaches, difficulty  swallowing, tooth/dental problems, sore throat,       No-  sneezing, itching, ear ache, +nasal congestion, post nasal drip,  CV:  No-   chest pain, orthopnea, PND, swelling in lower extremities, anasarca,  dizziness, palpitations Resp: + shortness of breath with exertion or at rest.           +   productive cough,  + non-productive cough,  No- coughing up of blood.              No-   change in color of mucus.  + wheezing.   Skin: No-   rash or lesions. GI:  No-   heartburn, indigestion, abdominal pain, nausea, vomiting,  GU:  MS:  No-   joint pain or swelling, + back pain . Neuro-     nothing unusual Psych:  No- change in mood or affect. No depression or anxiety.  No memory loss.  Objective:  OBJ- Physical Exam General- Alert, Oriented, Affect-appropriate, Distress- none acute. + Odor of tobacco, + overweight Skin- rash-none, lesions- none, excoriation- none Lymphadenopathy- none Head- atraumatic            Eyes- Gross vision intact, PERRLA, conjunctivae and secretions clear            Ears- Hearing, canals-normal            Nose- + Turbinate edema, no-Septal dev, mucus, polyps, erosion, perforation             Throat- Mallampati III , mucosa clear , drainage- none, tonsils- atrophic Neck- flexible , trachea midline, no stridor , thyroid nl, carotid no bruit, + mass left lateral neck/cheek Chest - symmetrical excursion , unlabored           Heart/CV- RRR , no murmur , no gallop  , no rub, nl s1 s2                           -  JVD- none , edema- none, stasis changes- none, varices- none           Lung- + diminished, wheeze- none,  Cough none, dullness-none, rub- none           Chest wall-  Abd-  Br/ Gen/ Rectal- Not done, not indicated Extrem- cyanosis- none, clubbing, none, atrophy- none, strength- nl + Cane Neuro- grossly intact to observation

## 2018-07-30 ENCOUNTER — Telehealth: Payer: Self-pay | Admitting: Internal Medicine

## 2018-07-30 MED ORDER — AZITHROMYCIN 250 MG PO TABS: ORAL_TABLET | ORAL | 0 refills | Status: DC

## 2018-07-30 NOTE — Telephone Encounter (Signed)
LMOM X1 

## 2018-07-30 NOTE — Telephone Encounter (Signed)
Spoke with patient he states symptoms are the same as when in office on 07/25/18. Zpack not sent in at last OV. Will send today to preferred pharmacy.  Nothing further needed at this time.

## 2018-08-17 ENCOUNTER — Ambulatory Visit: Payer: Self-pay

## 2018-08-17 DIAGNOSIS — M79676 Pain in unspecified toe(s): Principal | ICD-10-CM

## 2018-08-17 DIAGNOSIS — B351 Tinea unguium: Secondary | ICD-10-CM

## 2018-08-21 ENCOUNTER — Telehealth: Payer: Self-pay | Admitting: Internal Medicine

## 2018-08-21 DIAGNOSIS — G4733 Obstructive sleep apnea (adult) (pediatric): Secondary | ICD-10-CM

## 2018-08-21 NOTE — Telephone Encounter (Signed)
I have called and verified with cristal  the need for supplies I have sent in an order pre the last Ov nothing further needed at this time.

## 2018-08-29 NOTE — Progress Notes (Signed)
Pt presents with mycotic infection of nails 1-5 bilateral  All other systems are negative  Laser therapy administered to affected nails and tolerated well. All safety precautions were in place. RE-appointed in 4 weeks for 2nd treatment.  We will begin treatment due to disruption treatment

## 2018-09-14 ENCOUNTER — Other Ambulatory Visit: Payer: Medicare Other

## 2018-09-19 ENCOUNTER — Ambulatory Visit (INDEPENDENT_AMBULATORY_CARE_PROVIDER_SITE_OTHER): Payer: Medicare Other

## 2018-09-19 DIAGNOSIS — B351 Tinea unguium: Secondary | ICD-10-CM

## 2018-09-19 DIAGNOSIS — M79676 Pain in unspecified toe(s): Principal | ICD-10-CM

## 2018-09-25 NOTE — Progress Notes (Signed)
Pt presents with mycotic infection of nails 1-5 bilateral  All other systems are negative  Laser therapy administered to affected nails and tolerated well. All safety precautions were in place. RE-appointed in 4 weeks for 2nd treatment.  We will begin treatment due to disruption treatment

## 2018-10-19 ENCOUNTER — Ambulatory Visit: Payer: Self-pay

## 2018-10-19 DIAGNOSIS — B351 Tinea unguium: Secondary | ICD-10-CM

## 2018-10-19 DIAGNOSIS — M79676 Pain in unspecified toe(s): Principal | ICD-10-CM

## 2018-10-22 ENCOUNTER — Other Ambulatory Visit: Payer: Self-pay | Admitting: Internal Medicine

## 2018-10-24 NOTE — Progress Notes (Signed)
Pt presents with mycotic infection of nails 1-5 bilateral  All other systems are negative  Laser therapy administered to affected nails and tolerated well. All safety precautions were in place. RE-appointed in 4 weeks for 4th treatment.  We will begin treatment due to disruption treatment

## 2018-11-20 ENCOUNTER — Other Ambulatory Visit: Payer: Medicare Other

## 2018-11-20 ENCOUNTER — Ambulatory Visit: Payer: Self-pay

## 2018-11-20 DIAGNOSIS — M79676 Pain in unspecified toe(s): Principal | ICD-10-CM

## 2018-11-20 DIAGNOSIS — B351 Tinea unguium: Secondary | ICD-10-CM

## 2018-11-23 NOTE — Progress Notes (Signed)
Pt presents with mycotic infection of nails 1-5 bilateral  All other systems are negative  Laser therapy administered to affected nails and tolerated well. All safety precautions were in place. RE-appointed in 4 weeks for 5th treatment 

## 2018-12-18 ENCOUNTER — Ambulatory Visit (INDEPENDENT_AMBULATORY_CARE_PROVIDER_SITE_OTHER): Payer: Medicare Other

## 2018-12-18 DIAGNOSIS — B351 Tinea unguium: Secondary | ICD-10-CM

## 2018-12-18 DIAGNOSIS — M79676 Pain in unspecified toe(s): Principal | ICD-10-CM

## 2018-12-27 NOTE — Progress Notes (Signed)
Pt presents with mycotic infection of nails 1-5 bilateral  All other systems are negative  Laser therapy administered to affected nails and tolerated well. All safety precautions were in place. RE-appointed in 4 weeks for 6th treatment 

## 2019-01-17 ENCOUNTER — Ambulatory Visit: Payer: Self-pay

## 2019-01-17 DIAGNOSIS — B351 Tinea unguium: Secondary | ICD-10-CM

## 2019-01-17 DIAGNOSIS — M79676 Pain in unspecified toe(s): Principal | ICD-10-CM

## 2019-01-22 NOTE — Progress Notes (Signed)
Pt presents with mycotic infection of nails 1-5 bilateral  All other systems are negative  Laser therapy administered to affected nails and tolerated well. All safety precautions were in place. RE-appointed as needed

## 2019-01-23 ENCOUNTER — Other Ambulatory Visit: Payer: Self-pay

## 2019-01-23 DIAGNOSIS — I779 Disorder of arteries and arterioles, unspecified: Secondary | ICD-10-CM

## 2019-01-25 ENCOUNTER — Ambulatory Visit: Payer: Medicare Other | Admitting: Internal Medicine

## 2019-01-29 ENCOUNTER — Ambulatory Visit (INDEPENDENT_AMBULATORY_CARE_PROVIDER_SITE_OTHER): Payer: Medicare Other | Admitting: Physician Assistant

## 2019-01-29 ENCOUNTER — Ambulatory Visit: Payer: Medicare Other | Admitting: Family

## 2019-01-29 ENCOUNTER — Ambulatory Visit (HOSPITAL_COMMUNITY)
Admission: RE | Admit: 2019-01-29 | Discharge: 2019-01-29 | Disposition: A | Discharge status: Home / Self Care | Payer: Medicare Other | Source: Ambulatory Visit | Attending: Vascular Surgery | Admitting: Vascular Surgery

## 2019-01-29 ENCOUNTER — Encounter: Payer: Self-pay | Admitting: Physician Assistant

## 2019-01-29 ENCOUNTER — Encounter (HOSPITAL_COMMUNITY): Payer: Medicare Other

## 2019-01-29 ENCOUNTER — Other Ambulatory Visit: Payer: Self-pay

## 2019-01-29 VITALS — BP 143/75 | HR 60 | Temp 97.9°F | Resp 18 | Ht 65.0 in | Wt 195.0 lb

## 2019-01-29 DIAGNOSIS — I7092 Chronic total occlusion of artery of the extremities: Secondary | ICD-10-CM

## 2019-01-29 DIAGNOSIS — I779 Disorder of arteries and arterioles, unspecified: Secondary | ICD-10-CM

## 2019-01-29 DIAGNOSIS — F17200 Nicotine dependence, unspecified, uncomplicated: Secondary | ICD-10-CM

## 2019-01-29 NOTE — Progress Notes (Signed)
Established Previous Bypass   History of Present Illness   1. Dean [name] is a 73 y.o. ([birth year]) male who presents with PAD to go over vascular studies.  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 Kellie Simmering.  He returns today for routine surveillance.  He denies any traditional claudication, rest pain, or active tissue ischemia in his feet.  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  He is taking aspirin and statin daily.  He is a current tobacco user.   Current Outpatient Medications  Medication Sig Dispense Refill  . albuterol (PROVENTIL HFA) 108 (90 BASE) MCG/ACT inhaler Inhale 1 puff into the lungs 2 (two) times daily.     Marland Kitchen albuterol (PROVENTIL) (2.5 MG/3ML) 0.083% nebulizer solution USE 1 VIAL VIA NEBULIZER EVERY 6 HOURS AS NEEDED FOR WHEEZING OR SHORTNESS OF BREATH 150 mL 3  . aspirin 325 MG buffered tablet Take 325 mg by mouth daily.      Marland Kitchen atorvastatin (LIPITOR) 10 MG tablet Take 10 mg by mouth daily.      Marland Kitchen azithromycin (ZITHROMAX) 250 MG tablet Please dispense ZPak please use as directed. 6 tablet 0  . baclofen (LIORESAL) 10 MG tablet TK 1 T PO BID PRF SPASMS  0  . DILT-XR 240 MG 24 hr capsule Take 1 capsule by mouth daily.  0  . DYMISTA 137-50 MCG/ACT SUSP INSTILL 1 TO 2 SPRAYS INTO EACH NOSTRIL AT BEDTIME 1 Bottle 5  . finasteride (PROSCAR) 5 MG tablet Take 1 tablet by mouth daily.    . irbesartan-hydrochlorothiazide (AVALIDE) 150-12.5 MG tablet TAKE 1 TABLET BY MOUTH DAILY. 90 tablet 3  . isosorbide mononitrate (IMDUR) 60 MG 24 hr tablet Take 60 mg by mouth daily.      . traMADol (ULTRAM) 50 MG tablet Take 50 mg by mouth as needed.     . Vitamin D, Ergocalciferol, (DRISDOL) 50000 UNITS CAPS Take 1 tablet by mouth Once a week.     No current facility-administered medications for this visit.     On ROS today: 10 system ROS negative unless otherwise noted   Physical Examination     Vitals:   01/29/19 0853  BP: (!) 143/75  Pulse: 60  Resp: 18  Temp: 97.9 F (36.6 C)  TempSrc: Oral  SpO2: 91%  Weight: 195 lb (88.5 kg)  Height: 5\' 5"  (1.651 m)   Body mass index is 32.45 kg/m.  General Alert, O x 3, WD, NAD  Pulmonary Sym exp, good B air movt  Cardiac RRR, Nl S1, S2   Vascular Vessel Right Left  Radial Palpable Palpable  Aorta Not palpable N/A  Femoral Palpable Palpable  Popliteal Not palpable Not palpable  PT Not palpable Not palpable  DP Not palpable Not palpable    Gastro- intestinal soft, non-distended, non-tender to palpation,   Musculo- skeletal M/S 5/5 throughout  , Extremities without ischemic changes  ,   Neurologic Pain and light touch intact in extremities , Motor exam as listed above    Non-Invasive Vascular Imaging ABI (01/29/19)  ABI/TBIToday's ABIToday's TBIPrevious ABIPrevious TBI +-------+-----------+-----------+------------+------------+ Right  0.74       0.41       0.72        0.45         +-------+-----------+-----------+------------+------------+ Left   0.66       0.41       0.70  0.50            Medical Decision Making   Dean [name] is a 73 y.o. male who presents with PAD for recheck of ABIs s/p R to L femoral to femoral bypass 8 years post op   ABIs unchanged over the past 1 year  Encouraged ambulation  Continue aspirin and statin daily  Recheck fem fem bypass and ABIs in 1 year  Return sooner if tissue loss or rest pain develops  Encouraged smoking cessation  Dagoberto Ligas PA-C [facility] Office: [phone]  Clinic MD: Dr. Carlis Abbott

## 2019-03-07 ENCOUNTER — Telehealth: Payer: Self-pay | Admitting: Internal Medicine

## 2019-03-07 NOTE — Telephone Encounter (Signed)
Called patient x2 phone continued to ring busy.

## 2019-03-08 NOTE — Telephone Encounter (Signed)
Attempted to call pt x2 but each time immediately received a busy tone.will try to call back later.

## 2019-03-11 NOTE — Telephone Encounter (Signed)
LVM for pt to return call regarding rx refill of nebulizer medications.X1

## 2019-03-12 ENCOUNTER — Telehealth: Payer: Self-pay | Admitting: Internal Medicine

## 2019-03-12 MED ORDER — ALBUTEROL SULFATE (2.5 MG/3ML) 0.083% IN NEBU: INHALATION_SOLUTION | RESPIRATORY_TRACT | 3 refills | Status: DC

## 2019-03-12 NOTE — Telephone Encounter (Signed)
Albuterol sent to patients pharmacy Pt aware Nothing further needed.

## 2019-03-12 NOTE — Telephone Encounter (Signed)
If he can come in for CXR for dx COPD mixed type, then we can call him the result and he can be put on call-in schedule when we are open again. Otherwise we will wait on the CXR till he is seen here next time.

## 2019-03-12 NOTE — Telephone Encounter (Signed)
Attempted to call pt x3 but immediately received a busy tone. Due to multiple times trying to reach pt without being able to, message will be closed.

## 2019-03-12 NOTE — Telephone Encounter (Signed)
Returned call to patient. Albuterol sent to [facility]

## 2019-03-12 NOTE — Telephone Encounter (Signed)
Spoke to pt about rescheduling his appt and he is asking about the albuterol meds.

## 2019-03-12 NOTE — Telephone Encounter (Signed)
Call made to patient, patient the last time he saw CY he told him he needed a f/u CXR. Patient appt has been rescheduled but wants to know whether he still needs a CXR. He denies any symptoms but wanted CY recommendations.   CY please advise. Thanks.

## 2019-03-12 NOTE — Telephone Encounter (Signed)
Spoke with pt and advised of Dr Janee Morn recommendations. Pt would like to wait until f/u in 06/2019 to do cxr.  Nothing further needed.

## 2019-03-21 ENCOUNTER — Ambulatory Visit: Payer: Medicare Other | Admitting: Internal Medicine

## 2019-03-28 ENCOUNTER — Ambulatory Visit: Payer: Medicare Other | Admitting: Podiatry

## 2019-04-05 ENCOUNTER — Encounter: Payer: Self-pay | Admitting: Internal Medicine

## 2019-04-06 ENCOUNTER — Other Ambulatory Visit: Payer: Self-pay | Admitting: Internal Medicine

## 2019-04-08 NOTE — Telephone Encounter (Signed)
Generic Avalide 150-12.5 mg refilled.

## 2019-05-01 ENCOUNTER — Ambulatory Visit: Payer: Medicare Other | Admitting: Podiatry

## 2019-05-01 ENCOUNTER — Other Ambulatory Visit: Payer: Self-pay

## 2019-05-01 ENCOUNTER — Encounter: Payer: Self-pay | Admitting: Podiatry

## 2019-05-01 DIAGNOSIS — M79676 Pain in unspecified toe(s): Secondary | ICD-10-CM | POA: Diagnosis not present

## 2019-05-01 DIAGNOSIS — B351 Tinea unguium: Secondary | ICD-10-CM

## 2019-05-01 NOTE — Patient Instructions (Signed)

## 2019-05-06 ENCOUNTER — Encounter: Payer: Self-pay | Admitting: Internal Medicine

## 2019-05-06 ENCOUNTER — Ambulatory Visit (INDEPENDENT_AMBULATORY_CARE_PROVIDER_SITE_OTHER): Payer: Medicare Other | Admitting: Internal Medicine

## 2019-05-06 ENCOUNTER — Ambulatory Visit (INDEPENDENT_AMBULATORY_CARE_PROVIDER_SITE_OTHER): Payer: Medicare Other

## 2019-05-06 ENCOUNTER — Other Ambulatory Visit: Payer: Self-pay

## 2019-05-06 VITALS — BP 128/56 | HR 68 | Ht 65.0 in | Wt 187.2 lb

## 2019-05-06 DIAGNOSIS — J449 Chronic obstructive pulmonary disease, unspecified: Secondary | ICD-10-CM

## 2019-05-06 DIAGNOSIS — F17200 Nicotine dependence, unspecified, uncomplicated: Secondary | ICD-10-CM | POA: Diagnosis not present

## 2019-05-06 DIAGNOSIS — G4733 Obstructive sleep apnea (adult) (pediatric): Secondary | ICD-10-CM | POA: Diagnosis not present

## 2019-05-06 DIAGNOSIS — R911 Solitary pulmonary nodule: Secondary | ICD-10-CM

## 2019-05-06 MED ORDER — ALBUTEROL SULFATE (2.5 MG/3ML) 0.083% IN NEBU: INHALATION_SOLUTION | RESPIRATORY_TRACT | 12 refills | Status: DC

## 2019-05-06 NOTE — Assessment & Plan Note (Signed)
We can assume progression of COPD with continued smoking. Counseled. Avoiding LAMA due to urinary retention.  Plan- refill albuterol neb solution.

## 2019-05-06 NOTE — Progress Notes (Signed)
Patient ID: Dean [name], male    DOB: 07-28-1946, 73 y.o.   MRN: [id number]  HPI M  Smoker followed for COPD, obstructive sleep apnea, lung nodule, allergic rhinitis. NPSG 04/06/1999   AHI 87/hr, desaturation to 83%, body weight 185 pounds PFT- /04/2009-severe obstructive airways disease with insignificant response to bronchodilator. FVC 3.18/83%, FEV1 1.53/56%, ratio 0.48, TLC 86%, DLCO 51% Office Spirometry 03/20/2017-very severe obstructive airways disease. FVC 1.64/46%, FEV1 0.73/28%, ratio 0.45 ----------------------------------------------------------------------------------------------   07/25/2018- 73 year old male smoker followed for COPD , OSA, lung nodule, allergic rhinitis CPAP auto 10-20/[facility] -----OSA: DME [facility] Pt wears CPAP nightly and DL attached. New supplies needed as well. Pt denies any breathing issues.  Still smoking 2 packs a day against advice and efforts.  Discussed again. Dymsita, albuterol hfa, neb albuterol,  Download compliance 100% AHI 0.3/hour.  He does not want to sleep without CPAP. Breathing feels stable.  Inhaled medicines do help. Questions developing sinus infection with some frontal headache and green nasal discharge. CXR 01/25/18 Hyperaeration consistent with emphysema. Bibasilar scarring or atelectasis. No active process.  05/06/2019- 73 yoM  Smoker followed for COPD, allergic rhinitis, , obstructive sleep apnea, lung nodule, allergic rhinitis., complicated by PAOD, HBP, CAD, GERD, Parotid Mass (Dr Rosen/ ENT) Download 100%, AHI 0.5/ hr CPAP auto 10-20/ [facility] OSA on CPAP, DME: [facility]; uses CPAP every night, works well for him, however states due to his back & leg pain, he's having a hard time sleeping; usual SOB w/ exertion  Neb albuterol, albuterol hfa He is not trying to stop smoking. Minimizes cough and denies breathing changes. 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 He doesn't want surgery  know- reluctant to get put in SNF with Covid risk. 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   ROS-see HPI  + = positive Constitutional:   No-   weight loss, night sweats, fevers, chills, fatigue, lassitude. HEENT:   No-  headaches, difficulty swallowing, tooth/dental problems, sore throat,       No-  sneezing, itching, ear ache, +nasal congestion, post nasal drip,  CV:  No-   chest pain, orthopnea, PND, swelling in lower extremities, anasarca,  dizziness, palpitations Resp: + shortness of breath with exertion or at rest.           +   productive cough,  + non-productive cough,  No- coughing up of blood.              No-   change in color of mucus.  + wheezing.   Skin: No-   rash or lesions. GI:  No-   heartburn, indigestion, abdominal pain, nausea, vomiting,  GU:  MS:  + joint pain or swelling, + back pain . Neuro-     nothing unusual Psych:  No- change in mood or affect. No depression or anxiety.  No memory loss.  Objective:  OBJ- Physical Exam General- Alert, Oriented, Affect-appropriate, Distress- none acute. + Odor of tobacco, + overweight Skin- rash-none, lesions- none, excoriation- none Lymphadenopathy- none Head- atraumatic            Eyes- Gross vision intact, PERRLA, conjunctivae and secretions clear            Ears- Hearing, canals-normal            Nose- + Turbinate edema, no-Septal dev, mucus, polyps, erosion, perforation             Throat- Mallampati III , mucosa  clear , drainage- none, tonsils- atrophic Neck- flexible , trachea midline, no stridor , thyroid nl, carotid no bruit, + mass left lateral neck/cheek Chest - symmetrical excursion , unlabored           Heart/CV- RRR , no murmur , no gallop  , no rub, nl s1 s2                           - JVD- none , edema- none, stasis changes- none, varices- none           Lung- + diminished, wheeze- none,  Cough none, dullness-none, rub- none           Chest wall-  Abd-  Br/ Gen/ Rectal- Not done, not  indicated Extrem- cyanosis- none, clubbing, none, atrophy- none, strength- nl + Cane Neuro- grossly intact to observation

## 2019-05-06 NOTE — Assessment & Plan Note (Signed)
He is not prepared to try to stop at this time. Counseled for motivation and support.

## 2019-05-06 NOTE — Patient Instructions (Signed)
Script sent refill nebulizer solution and increasing the amount to 75 of the 3 ml ampules/ time.  Order- CXR   Dx COPD mixed type  Our smoking remains really important, and any time you can skip one, or put it out early, is a win for you.  Please call if I can help

## 2019-05-06 NOTE — Assessment & Plan Note (Signed)
For f/u CXR

## 2019-05-06 NOTE — Assessment & Plan Note (Signed)
Continues to benefit from CPAP and download confirms good compliance and control. Plan- continue CPAP auto 10-20

## 2019-05-09 ENCOUNTER — Encounter: Payer: Self-pay | Admitting: Podiatry

## 2019-05-09 NOTE — Progress Notes (Signed)
Subjective:  Dean [name] presents to clinic today for follow-up of painful, thick, discolored, elongated toenails 1-5 b/l that become tender and cannot cut because of thickness.  Aggravating factors include wearing enclosed shoe gear.  Symptoms are relieved with periodic professional debridement.  [name], MD is his PCP.   Current Outpatient Medications:  .  albuterol (PROVENTIL HFA) 108 (90 BASE) MCG/ACT inhaler, Inhale 1 puff into the lungs 2 (two) times daily. , Disp: , Rfl:  .  albuterol (PROVENTIL) (2.5 MG/3ML) 0.083% nebulizer solution, USE 1 VIAL VIA NEBULIZER EVERY 6 HOURS AS NEEDED FOR WHEEZING OR SHORTNESS OF BREATH DX:J44.9, Disp: 225 mL, Rfl: 12 .  aspirin 325 MG buffered tablet, Take 325 mg by mouth daily.  , Disp: , Rfl:  .  atorvastatin (LIPITOR) 10 MG tablet, Take 10 mg by mouth daily.  , Disp: , Rfl:  .  azithromycin (ZITHROMAX) 250 MG tablet, Please dispense ZPak please use as directed., Disp: 6 tablet, Rfl: 0 .  baclofen (LIORESAL) 10 MG tablet, TK 1 T PO BID PRF SPASMS, Disp: , Rfl: 0 .  DILT-XR 240 MG 24 hr capsule, Take 1 capsule by mouth daily., Disp: , Rfl: 0 .  DYMISTA 137-50 MCG/ACT SUSP, INSTILL 1 TO 2 SPRAYS INTO EACH NOSTRIL AT BEDTIME, Disp: 1 Bottle, Rfl: 5 .  finasteride (PROSCAR) 5 MG tablet, Take 1 tablet by mouth daily., Disp: , Rfl:  .  irbesartan-hydrochlorothiazide (AVALIDE) 150-12.5 MG tablet, TAKE 1 TABLET BY MOUTH DAILY, Disp: 90 tablet, Rfl: 1 .  isosorbide mononitrate (IMDUR) 60 MG 24 hr tablet, Take 60 mg by mouth daily.  , Disp: , Rfl:  .  traMADol (ULTRAM) 50 MG tablet, Take 50 mg by mouth as needed. , Disp: , Rfl:  .  Vitamin D, Ergocalciferol, (DRISDOL) 50000 UNITS CAPS, Take 1 tablet by mouth Once a week., Disp: , Rfl:    Allergies  Allergen Reactions  . Penicillins Palpitations    REACTION: rapid pulse     Objective: There were no vitals filed for this visit.  Physical Examination: [name] is a 73 year old Caucasian  male, well-developed well-nourished in no acute distress.  Alert awake and oriented x3.  Vascular Examination: Capillary refill time immediate x 10 digits.  Palpable DP/PT pulses b/l.  Digital hair present b/l.  No edema noted b/l.  Skin temperature gradient WNL b/l.  Dermatological Examination: Skin with normal turgor and tone b/l.  No open wounds b/l.  No interdigital macerations noted b/l.  Dry skin noted bilateral feet.  Elongated, thick, discolored brittle toenails with subungual debris and pain on dorsal palpation of nailbeds 1-5 b/l.  Musculoskeletal Examination: Muscle strength 5/5 to all muscle groups b/l  No pain, crepitus or joint discomfort with active/passive ROM.  Neurological Examination: Sensation intact 5/5 b/l with 10 gram monofilament.  Vibratory sensation intact b/l.  Proprioceptive sensation intact b/l.  Assessment: Mycotic nail infection with pain 1-5 b/l  Plan: 1. Toenails 1-5 b/l were debrided in length and girth without iatrogenic laceration. 2. Because [name] is unable to reach his feet, he was instructed to use spray baby oil on his feet daily. 3. Continue soft, supportive shoe gear daily. 3.  Report any pedal injuries to medical professional. 4.  Follow up 3 months. 5.  Patient/POA to call should there be a question/concern in there interim.

## 2019-07-03 ENCOUNTER — Telehealth: Payer: Self-pay | Admitting: Internal Medicine

## 2019-07-03 MED ORDER — ALBUTEROL SULFATE (2.5 MG/3ML) 0.083% IN NEBU: INHALATION_SOLUTION | RESPIRATORY_TRACT | 12 refills | Status: DC

## 2019-07-03 MED ORDER — ALBUTEROL SULFATE HFA 108 (90 BASE) MCG/ACT IN AERS: 1.0000 | INHALATION_SPRAY | Freq: Two times a day (BID) | RESPIRATORY_TRACT | 5 refills | Status: DC

## 2019-07-03 NOTE — Telephone Encounter (Signed)
Called and spoke with pt verifying which meds he needed to have sent to pharmacy. Also verified pharmacy that meds needed to be sent to and have sent the requests to pharmacy for pt. Nothing further needed.

## 2019-07-04 ENCOUNTER — Ambulatory Visit: Payer: Medicare Other | Admitting: Internal Medicine

## 2019-07-17 ENCOUNTER — Other Ambulatory Visit: Payer: Self-pay | Admitting: Internal Medicine

## 2019-07-20 ENCOUNTER — Other Ambulatory Visit: Payer: Self-pay | Admitting: Internal Medicine

## 2019-07-31 ENCOUNTER — Ambulatory Visit: Payer: Medicare Other | Admitting: Podiatry

## 2019-08-05 ENCOUNTER — Other Ambulatory Visit: Payer: Self-pay

## 2019-08-05 ENCOUNTER — Ambulatory Visit (INDEPENDENT_AMBULATORY_CARE_PROVIDER_SITE_OTHER): Payer: Medicare Other | Admitting: Podiatry

## 2019-08-05 ENCOUNTER — Encounter: Payer: Self-pay | Admitting: Podiatry

## 2019-08-05 DIAGNOSIS — B351 Tinea unguium: Secondary | ICD-10-CM | POA: Diagnosis not present

## 2019-08-05 DIAGNOSIS — F17200 Nicotine dependence, unspecified, uncomplicated: Secondary | ICD-10-CM | POA: Insufficient documentation

## 2019-08-05 DIAGNOSIS — L84 Corns and callosities: Secondary | ICD-10-CM | POA: Diagnosis not present

## 2019-08-05 DIAGNOSIS — M79676 Pain in unspecified toe(s): Secondary | ICD-10-CM | POA: Diagnosis not present

## 2019-08-05 NOTE — Patient Instructions (Signed)

## 2019-08-07 ENCOUNTER — Telehealth: Payer: Self-pay | Admitting: *Deleted

## 2019-08-07 NOTE — Telephone Encounter (Signed)
   [facility] Pre-operative Risk Assessment    Request for surgical clearance:  1. What type of surgery is being performed? RIGHT TOTAL KNEE REPLACEMENT   2. When is this surgery scheduled? TBD   3. What type of clearance is required (medical clearance vs. Pharmacy clearance to hold med vs. Both)? MEDICAL   4. Are there any medications that need to be held prior to surgery and how long?    5. Practice name and name of physician performing surgery? [facility]    6. What is your office phone number? [phone]     7.   What is your office fax number? [place]   Anesthesia type (None, local, MAC, general) ? UNKNOWN

## 2019-08-08 NOTE — Telephone Encounter (Signed)
Pt has been scheduled to see Jory Sims, NP, 08/14/2019 at 3:45. Pt made aware to arrive 15 mins early and to wear a mask.  Will route to the requesting surgeon's office to make them aware.

## 2019-08-08 NOTE — Telephone Encounter (Signed)
   Primary Cardiologist:Kenneth C Hilty, MD  Chart reviewed as part of pre-operative protocol coverage. Because of Dean [name]'s past medical history and time since last visit, he/she will require a follow-up visit in order to better assess preoperative cardiovascular risk.  Pre-op covering staff: - Please schedule appointment and call patient to inform them. - Please contact requesting surgeon's office via preferred method (i.e, phone, fax) to inform them of need for appointment prior to surgery.  If applicable, this message will also be routed to pharmacy pool and/or primary cardiologist for input on holding anticoagulant/antiplatelet agent as requested below so that this information is available at time of patient's appointment.   Kathyrn Drown, NP  08/08/2019, 8:47 AM

## 2019-08-14 ENCOUNTER — Ambulatory Visit (INDEPENDENT_AMBULATORY_CARE_PROVIDER_SITE_OTHER): Payer: Medicare Other | Admitting: Adult Health

## 2019-08-14 ENCOUNTER — Encounter: Payer: Self-pay | Admitting: Adult Health

## 2019-08-14 ENCOUNTER — Other Ambulatory Visit: Payer: Self-pay

## 2019-08-14 VITALS — BP 134/65 | HR 62 | Ht 65.0 in | Wt 177.0 lb

## 2019-08-14 DIAGNOSIS — R011 Cardiac murmur, unspecified: Secondary | ICD-10-CM | POA: Diagnosis not present

## 2019-08-14 DIAGNOSIS — I10 Essential (primary) hypertension: Secondary | ICD-10-CM | POA: Diagnosis not present

## 2019-08-14 DIAGNOSIS — I779 Disorder of arteries and arterioles, unspecified: Secondary | ICD-10-CM

## 2019-08-14 DIAGNOSIS — R9431 Abnormal electrocardiogram [ECG] [EKG]: Secondary | ICD-10-CM

## 2019-08-14 DIAGNOSIS — Z79899 Other long term (current) drug therapy: Secondary | ICD-10-CM

## 2019-08-14 DIAGNOSIS — E782 Mixed hyperlipidemia: Secondary | ICD-10-CM

## 2019-08-14 DIAGNOSIS — Z01810 Encounter for preprocedural cardiovascular examination: Secondary | ICD-10-CM

## 2019-08-14 DIAGNOSIS — F17200 Nicotine dependence, unspecified, uncomplicated: Secondary | ICD-10-CM

## 2019-08-14 NOTE — Patient Instructions (Signed)
Medication Instructions:  Continue current medications  If you need a refill on your cardiac medications before your next appointment, please call your pharmacy.  Labwork: BMP and BNP HERE IN OUR OFFICE AT [facility]  You will NOT need to fast   Take the provided lab slips with you to the lab for your blood draw.   When you have your labs (blood work) drawn today and your tests are completely normal, you will receive your results only by MyChart Message (if you have MyChart) -OR-  A paper copy in the mail.  If you have any lab test that is abnormal or we need to change your treatment, we will call you to review these results.  Testing/Procedures: Your physician has requested that you have an echocardiogram. Echocardiography is a painless test that uses sound waves to create images of your heart. It provides your doctor with information about the size and shape of your heart and how well your heart's chambers and valves are working. This procedure takes approximately one hour. There are no restrictions for this procedure.   Follow-Up: . Your physician recommends that you schedule a follow-up appointment in: 2 weeks   At [facility], you and your health needs are our priority.  As part of our continuing mission to provide you with exceptional heart care, we have created designated Provider Care Teams.  These Care Teams include your primary Cardiologist (physician) and Advanced Practice Providers (APPs -  Physician Assistants and Nurse Practitioners) who all work together to provide you with the care you need, when you need it.  Thank you for choosing [facility] at [facility]!!

## 2019-08-14 NOTE — Progress Notes (Signed)
Cardiology Office Note   Date:  08/14/2019   ID:  Dean [name] 03/30/1946, MRN [id number]  PCP:  [name], MD  Cardiologist: Dr.Hilty  No chief complaint on file.    History of Present Illness: Dean [name] is a 73 y.o. male who presents for ongoing assessment and management of hypertension, with history of peripheral arterial disease status post femorofemoral bypass for occluded distal aorta, dyslipidemia, ongoing tobacco abuse.  Most recent stress Myoview on 12/31/2014, was negative for ischemia but showed a small fixed defect which was likely artifact however scar could not be excluded.  On last office visit on 01/30/2018 the patient had been seen by pulmonology and diagnosed with severe COPD.  He continued to have musculoskeletal pain in his low back and his right hip.  He is planned for right hip replacement with Dr. Mardelle Matte, on date to be determined.  The patient unfortunately continues to smoke, is very sedentary due to back pain and hip pain.  Today he comes into the office with O2 sats of 86 to 88%.  We placed oxygen via nasal cannula on him during our office visit.  He does not normally wear oxygen at home.  He states that he does not have any plans to quit smoking.  Past Medical History:  Diagnosis Date  . Allergy    pollen  . Arthritis   . Asthma   . Atrial fibrillation ([place])    no documentation in epic/ pt states has never been told has A Fib  . CAD (coronary artery disease)    s/p angioplasty and stenting x3 1994  . Cataract    small, mild  . COPD (chronic obstructive pulmonary disease) ([place])   . GERD (gastroesophageal reflux disease)   . Hyperlipidemia   . Hypertension   . Kidney stone   . Neuromuscular disorder ([place])    nerve issues with back   . Peripheral vascular disease ([place])   . PVC's (premature ventricular contractions)   . Sleep apnea    wears cpap    Past Surgical History:  Procedure Laterality Date  . APPENDECTOMY    .  CHOLECYSTECTOMY  1990s   Gall Bladder  . COLONOSCOPY    . CORONARY ANGIOPLASTY WITH STENT PLACEMENT  1994  . lumbar disectomy  2005   L3 - 4 - 5  . POLYPECTOMY    . PR VEIN BYPASS GRAFT,AORTO-FEM-POP  08/31/11   Right to Left Fem-Fem  . TONSILLECTOMY    . UMBILICAL HERNIA REPAIR       Current Outpatient Medications  Medication Sig Dispense Refill  . albuterol (PROVENTIL HFA) 108 (90 Base) MCG/ACT inhaler Inhale 1 puff into the lungs 2 (two) times daily. 8 g 5  . albuterol (PROVENTIL) (2.5 MG/3ML) 0.083% nebulizer solution USE 1 VIAL VIA NEBULIZER EVERY 6 HOURS AS NEEDED FOR WHEEZING OR SHORTNESS OF BREATH 225 mL 12  . aspirin 325 MG buffered tablet Take 325 mg by mouth daily.      Marland Kitchen atorvastatin (LIPITOR) 10 MG tablet Take 10 mg by mouth daily.      . Azelastine-Fluticasone 137-50 MCG/ACT SUSP PLACE 1-2 SPRAYS INTO EACH NOSTRIL AT BEDTIME 23 g 3  . diltiazem (TIAZAC) 240 MG 24 hr capsule TK 1 C PO D    . esomeprazole (NEXIUM) 40 MG capsule TK ONE C PO BID    . finasteride (PROSCAR) 5 MG tablet Take 1 tablet by mouth daily.    . irbesartan-hydrochlorothiazide (AVALIDE) 150-12.5 MG tablet Take  1 tablet by mouth daily. OFFICE VISIT NEEDED 40 tablet 0  . isosorbide mononitrate (IMDUR) 60 MG 24 hr tablet Take 60 mg by mouth daily.      . meloxicam (MOBIC) 15 MG tablet     . traMADol (ULTRAM) 50 MG tablet Take 50 mg by mouth as needed.     . Vitamin D, Ergocalciferol, (DRISDOL) 50000 UNITS CAPS Take 1 tablet by mouth Once a week.     No current facility-administered medications for this visit.     Allergies:   Penicillins    Social History:  The patient  reports that he has been smoking cigarettes. He has a 70.50 pack-year smoking history. He has never used smokeless tobacco. He reports current alcohol use. He reports that he does not use drugs.   Family History:  The patient's family history includes Breast cancer in his sister; Cancer in his mother and sister; Diabetes in his  father; Hyperlipidemia in his brother, mother, and sister; Hypertension in his brother, father, mother, and sister; Kidney cancer in his mother; Other in his father; Prostate cancer in his brother.    ROS: All other systems are reviewed and negative. Unless otherwise mentioned in H&P    PHYSICAL EXAM: VS:  BP 134/65   Pulse 62   Ht 5\' 5"  (1.651 m)   Wt 177 lb (80.3 kg)   SpO2 (!) 89%   BMI 29.45 kg/m  , BMI Body mass index is 29.45 kg/m. GEN: Well nourished, well developed, in no acute distress HEENT: normal Neck: no JVD, bilateral carotid bruits, or masses Cardiac: RRR; 2/6 systolic murmurs, rubs, or gallops,no edema  Respiratory:  Clear to auscultation bilaterally, normal work of breathing, requiring oxygen via nasal cannula due to desaturations here in the office. GI: soft, nontender, nondistended, + BS MS: no deformity or atrophy Skin: warm and dry, no rash Neuro:  Strength and sensation are intact Psych: euthymic mood, full affect   EKG: Sinus rhythm with PVCs, incomplete right bundle branch block, prolonged QT interval.  Heart rate of 62 bpm.  Recent Labs: No results found for requested labs within last 8760 hours.    Lipid Panel No results found for: CHOL, TRIG, HDL, CHOLHDL, VLDL, LDLCALC, LDLDIRECT    Wt Readings from Last 3 Encounters:  08/14/19 177 lb (80.3 kg)  05/06/19 187 lb 3.2 oz (84.9 kg)  01/29/19 195 lb (88.5 kg)      Other studies Reviewed: Stress Myoview 01/25/2015 QPS Raw Data Images:  Normal; no motion artifact; normal heart/lung ratio. Stress Images:  There is decreased uptake in the apex. Rest Images:  There is decreased uptake in the apex. Subtraction (SDS):  No evidence of ischemia.  Impression Exercise Capacity:  Lexiscan with no exercise. BP Response:  Normal blood pressure response. Clinical Symptoms:  No significant symptoms noted. ECG Impression:  There are scattered PVCs. Comparison with Prior Nuclear Study: No previous nuclear  study performed  Overall Impression:  Low risk stress nuclear study with small, mild fixed inferoapical defect, likely artifact or small area of scar. The study was non-gated due to ectopy.   ASSESSMENT AND PLAN:  1.  Preoperative cardiac evaluation: The patient is not cleared from a cardiovascular standpoint at this time due to abnormal EKG and abnormal auscultation of aortic valve.  The patient will have an echocardiogram for further evaluation of his LV function and valvular status.  I will check a BMET and magnesium for electrolyte abnormalities causing frequent PVCs and prolonged QT interval.  There is no new findings compared to prior EKG completed in February 2019.  2.  Hypertension: Blood pressure is currently controlled on medication regimen which includes diltiazem, irbesartan HCTZ and isosorbide mononitrate.  No plan changes in his medical regimen at this time  3.  Peripheral arterial disease: History of femorofemoral bypass for occluded distal aorta.  He continues to have a lot of back pain and right hip pain.  He may need further evaluation of his PAD post surgery if his symptoms continue.  4.  Hypercholesterolemia: Goal of LDL is less than 70.  The patient continues on atorvastatin 10 mg daily.  I do not see any recent lab studies concerning his status.  He will need to have follow-up fasting lipids and LFTs on another visit to evaluate his current levels.  5.  COPD: Today the patient required oxygen via nasal cannula due to O2 sats of 87 to 88% in the office.  He is followed by Dr. Annamaria Boots, pulmonologist.  Defer to Dr. Annamaria Boots for further recommendations and need for oxygen at their discretion.  6.  Ongoing tobacco abuse: Patient was counseled on smoking cessation.  He is aware of the dangers but has no desire to quit at this time.   Current medicines are reviewed at length with the patient today.    Labs/ tests ordered today include: BMET, magnesium.  Echocardiogram  Phill Myron.  West Pugh, ANP, AACC   08/14/2019 5:01 PM    [facility] [place] [address] Office [phone] Fax [phone]

## 2019-08-15 ENCOUNTER — Telehealth: Payer: Self-pay | Admitting: *Deleted

## 2019-08-15 LAB — BASIC METABOLIC PANEL
BUN/Creatinine Ratio: 10 (ref 10–24)
BUN: 13 mg/dL (ref 8–27)
CO2: 30 mmol/L — ABNORMAL HIGH (ref 20–29)
Calcium: 10.5 mg/dL — ABNORMAL HIGH (ref 8.6–10.2)
Chloride: 89 mmol/L — ABNORMAL LOW (ref 96–106)
Creatinine, Ser: 1.33 mg/dL — ABNORMAL HIGH (ref 0.76–1.27)
GFR calc Af Amer: 61 mL/min/{1.73_m2} (ref >59)
GFR calc non Af Amer: 53 mL/min/{1.73_m2} — ABNORMAL LOW (ref >59)
Glucose: 97 mg/dL (ref 65–99)
Potassium: 3.9 mmol/L (ref 3.5–5.2)
Sodium: 135 mmol/L (ref 134–144)

## 2019-08-15 LAB — MAGNESIUM: Magnesium: 1.8 mg/dL (ref 1.6–2.3)

## 2019-08-15 MED ORDER — MAGNESIUM OXIDE 400 MG PO CAPS: 400.0000 mg | ORAL_CAPSULE | Freq: Every day | ORAL | 3 refills | Status: DC

## 2019-08-15 NOTE — Progress Notes (Signed)
Subjective: Dean [name] presents to clinic with cc of painful mycotic toenails  which are aggravated when weightbearing with and without shoe gear.  This pain limits his daily activities. Pain symptoms resolve with periodic professional debridement.  He does have history of right to left femorofemoral bypass.  [name], MD is his PCP.   Current Outpatient Medications:  .  albuterol (PROVENTIL HFA) 108 (90 Base) MCG/ACT inhaler, Inhale 1 puff into the lungs 2 (two) times daily., Disp: 8 g, Rfl: 5 .  albuterol (PROVENTIL) (2.5 MG/3ML) 0.083% nebulizer solution, USE 1 VIAL VIA NEBULIZER EVERY 6 HOURS AS NEEDED FOR WHEEZING OR SHORTNESS OF BREATH, Disp: 225 mL, Rfl: 12 .  aspirin 325 MG buffered tablet, Take 325 mg by mouth daily.  , Disp: , Rfl:  .  atorvastatin (LIPITOR) 10 MG tablet, Take 10 mg by mouth daily.  , Disp: , Rfl:  .  Azelastine-Fluticasone 137-50 MCG/ACT SUSP, PLACE 1-2 SPRAYS INTO EACH NOSTRIL AT BEDTIME, Disp: 23 g, Rfl: 3 .  diltiazem (TIAZAC) 240 MG 24 hr capsule, TK 1 C PO D, Disp: , Rfl:  .  esomeprazole (NEXIUM) 40 MG capsule, TK ONE C PO BID, Disp: , Rfl:  .  finasteride (PROSCAR) 5 MG tablet, Take 1 tablet by mouth daily., Disp: , Rfl:  .  irbesartan-hydrochlorothiazide (AVALIDE) 150-12.5 MG tablet, Take 1 tablet by mouth daily. OFFICE VISIT NEEDED, Disp: 40 tablet, Rfl: 0 .  isosorbide mononitrate (IMDUR) 60 MG 24 hr tablet, Take 60 mg by mouth daily.  , Disp: , Rfl:  .  meloxicam (MOBIC) 15 MG tablet, , Disp: , Rfl:  .  traMADol (ULTRAM) 50 MG tablet, Take 50 mg by mouth as needed. , Disp: , Rfl:  .  Vitamin D, Ergocalciferol, (DRISDOL) 50000 UNITS CAPS, Take 1 tablet by mouth Once a week., Disp: , Rfl:    Allergies  Allergen Reactions  . Penicillins Palpitations    REACTION: rapid pulse     Objective:  Physical Examination:  Vascular  Examination: Capillary refill time is immediate to all 10 digits.  He has palpable dorsalis pedis and posterior  tibial pulses bilaterally.  Digital hair is present bilaterally.  There is no lower extremity pedal edema noted bilaterally.  Skin temperature gradient remains within normal limits bilaterally.  Dermatological Examination: Pedal skin with normal turgor, texture and tone bilaterally.  There are no open wounds noted bilaterally.  No interdigital macerations noted bilaterally.  Elongated, thick, discolored brittle toenails with subungual debris and pain on dorsal palpation of nailbeds 1-5 b/l.  Hyperkeratotic lesion noted left great toe interphalangeal joint and submetatarsal heads 1.  There is no erythema, no edema, no drainage, no flocculence noted.  Musculoskeletal Examination: Muscle strength 5/5 to all muscle groups b/l.  Hallux valgus with bunion deformity bilaterally.  No pain, crepitus or joint discomfort with active/passive ROM.  Neurological Examination: Sensation intact 5/5 b/l with 10 gram monofilament.  Vibratory sensation intact b/l.  Proprioceptive sensation intact b/l.  Assessment: 1. Mycotic nail infection with pain 1-5 b/l 2. Calluses noted left hallux and submetatarsal head 1  Plan: 1. Toenails 1-5 b/l were debrided in length and girth without iatrogenic laceration. 2. Hyperkeratotic lesions pared left hallux and submetatarsal head 1 utilizing sterile scalpel blade without incident. 3. Continue soft, supportive shoe gear daily. 4. Report any pedal injuries to medical professional. 5. Follow up 3 months. 6. Patient/POA to call should there be a question/concern in there interim.

## 2019-08-15 NOTE — Telephone Encounter (Signed)
Pt aware of his blood work Magnesium 400 mg send into pt pharmacy

## 2019-08-15 NOTE — Telephone Encounter (Signed)
-----   Message from Lendon Colonel, NP sent at 08/15/2019  7:21 AM EDT ----- Please begin magnesium 400 mg daily due to low magnesium.

## 2019-08-19 ENCOUNTER — Other Ambulatory Visit (HOSPITAL_COMMUNITY): Payer: Medicare Other

## 2019-08-23 ENCOUNTER — Other Ambulatory Visit (HOSPITAL_COMMUNITY): Payer: Medicare Other

## 2019-08-23 ENCOUNTER — Ambulatory Visit (HOSPITAL_COMMUNITY): Payer: Medicare Other | Attending: Cardiovascular Disease

## 2019-08-23 DIAGNOSIS — R9431 Abnormal electrocardiogram [ECG] [EKG]: Secondary | ICD-10-CM | POA: Diagnosis not present

## 2019-08-23 DIAGNOSIS — R011 Cardiac murmur, unspecified: Secondary | ICD-10-CM | POA: Diagnosis not present

## 2019-08-27 ENCOUNTER — Other Ambulatory Visit: Payer: Self-pay | Admitting: Internal Medicine

## 2019-08-27 NOTE — Progress Notes (Signed)
Virtual Visit via Telephone Note   This visit type was conducted due to national recommendations for restrictions regarding the COVID-19 Pandemic (e.g. social distancing) in an effort to limit this patient's exposure and mitigate transmission in our community.  Due to his co-morbid illnesses, this patient is at least at moderate risk for complications without adequate follow up.  This format is felt to be most appropriate for this patient at this time.  The patient did not have access to video technology/had technical difficulties with video requiring transitioning to audio format only (telephone).  All issues noted in this document were discussed and addressed.  No physical exam could be performed with this format.  Please refer to the patient's chart for his  consent to telehealth for [facility].   Date:  08/28/2019   ID:  MYCHEAL VELDHUIZEN, DOB 08-14-1946, MRN [id number]  Patient Location: Home Provider Location: Home  PCP:  [name], MD  Cardiologist:  Pixie Casino, MD  Electrophysiologist:  None   Evaluation Performed:  Follow-Up Visit  Chief Complaint: Follow up Pre-Op evaluation.  History of Present Illness:    Dean [name] is a 73 y.o. male for ongoing assessment and management of hypertension, history of PAD status post femoral-femoral bypass for occluded distal aorta, dyslipidemia, ongoing tobacco abuse.  Most recent stress test was a Myoview on 12/31/2014 and was found to be negative for ischemia but did show a small fixed defect which was likely artifact.  The patient is also being followed by pulmonary and has been diagnosed with a severe COPD.  The patient was last seen in the office on 08/14/2019 and was planned for right hip replacement with Dr. Mardelle Matte on a date to be determined.  The patient was very sedentary secondary to hip pain.  The patient's oxygen saturation was low at 86% and he was placed on oxygen in our office.  He does not normally wear home O2.   During my assessment in the office the patient had a significant aortic murmur, his EKG was abnormal with frequent PVCs and a prolonged QT interval.  An echocardiogram was ordered along with a BMET and a magnesium.  Echocardiogram dated 08/23/2019 revealed normal LV systolic function with mild concentric LVH, and diastolic dysfunction.  There were no valvular abnormalities noted.  Magnesium was low normal at 1.8 and he was started on 400 mg daily.  Creatinine was elevated at 1.33.  We will reduce his magnesium to 200 mg daily on this office visit.   The patient does not have symptoms concerning for COVID-19 infection (fever, chills, cough, or new shortness of breath).    Past Medical History:  Diagnosis Date  . Allergy    pollen  . Arthritis   . Asthma   . Atrial fibrillation ([place])    no documentation in epic/ pt states has never been told has A Fib  . CAD (coronary artery disease)    s/p angioplasty and stenting x3 1994  . Cataract    small, mild  . COPD (chronic obstructive pulmonary disease) ([place])   . GERD (gastroesophageal reflux disease)   . Hyperlipidemia   . Hypertension   . Kidney stone   . Neuromuscular disorder ([place])    nerve issues with back   . Peripheral vascular disease ([place])   . PVC's (premature ventricular contractions)   . Sleep apnea    wears cpap   Past Surgical History:  Procedure Laterality Date  . APPENDECTOMY    .  CHOLECYSTECTOMY  1990s   Gall Bladder  . COLONOSCOPY    . CORONARY ANGIOPLASTY WITH STENT PLACEMENT  1994  . lumbar disectomy  2005   L3 - 4 - 5  . POLYPECTOMY    . PR VEIN BYPASS GRAFT,AORTO-FEM-POP  08/31/11   Right to Left Fem-Fem  . TONSILLECTOMY    . UMBILICAL HERNIA REPAIR       Current Meds  Medication Sig  . albuterol (PROVENTIL HFA) 108 (90 Base) MCG/ACT inhaler Inhale 1 puff into the lungs 2 (two) times daily.  Marland Kitchen albuterol (PROVENTIL) (2.5 MG/3ML) 0.083% nebulizer solution USE 1 VIAL VIA NEBULIZER EVERY 6 HOURS AS NEEDED  FOR WHEEZING OR SHORTNESS OF BREATH  . aspirin 325 MG buffered tablet Take 325 mg by mouth daily.    Marland Kitchen atorvastatin (LIPITOR) 10 MG tablet Take 10 mg by mouth daily.    . Azelastine-Fluticasone 137-50 MCG/ACT SUSP PLACE 1-2 SPRAYS INTO EACH NOSTRIL AT BEDTIME  . diltiazem (TIAZAC) 240 MG 24 hr capsule TK 1 C PO D  . esomeprazole (NEXIUM) 40 MG capsule TK ONE C PO BID  . finasteride (PROSCAR) 5 MG tablet Take 1 tablet by mouth daily.  . irbesartan-hydrochlorothiazide (AVALIDE) 150-12.5 MG tablet Take 1 tablet by mouth daily. OFFICE VISIT NEEDED  . isosorbide mononitrate (IMDUR) 60 MG 24 hr tablet Take 60 mg by mouth daily.    . Magnesium Oxide 400 MG CAPS Take 1 capsule (400 mg total) by mouth daily.  . meloxicam (MOBIC) 15 MG tablet   . traMADol (ULTRAM) 50 MG tablet Take 50 mg by mouth as needed.   . Vitamin D, Ergocalciferol, (DRISDOL) 50000 UNITS CAPS Take 1 tablet by mouth Once a week.     Allergies:   Penicillins   Social History   Tobacco Use  . Smoking status: Current Every Day Smoker    Packs/day: 1.50    Years: 47.00    Pack years: 70.50    Types: Cigarettes  . Smokeless tobacco: Never Used  . Tobacco comment: use e cig as well- close to 2 packs a day 06-23-17  Substance Use Topics  . Alcohol use: Yes    Alcohol/week: 0.0 standard drinks    Comment: rarely  . Drug use: No     Family Hx: The patient's family history includes Breast cancer in his sister; Cancer in his mother and sister; Diabetes in his father; Hyperlipidemia in his brother, mother, and sister; Hypertension in his brother, father, mother, and sister; Kidney cancer in his mother; Other in his father; Prostate cancer in his brother. There is no history of Colon cancer, Colon polyps, Esophageal cancer, Rectal cancer, or Stomach cancer.  ROS:   Please see the history of present illness.    All other systems reviewed and are negative.   Prior CV studies:   The following studies were reviewed today:   Echocardiogram 08/23/2019  1. The left ventricle has hyperdynamic systolic function, with an ejection fraction of >65%. The cavity size was normal. There is mild concentric left ventricular hypertrophy. Left ventricular diastolic Doppler parameters are consistent with impaired  relaxation.  2. The right ventricle has normal systolic function. The cavity was normal. There is no increase in right ventricular wall thickness. Right ventricular systolic pressure could not be assessed.  3. The aortic valve is tricuspid. Mild thickening of the aortic valve. Mild calcification of the aortic valve.  4. The aorta is normal unless otherwise noted.  Labs/Other Tests and Data Reviewed:  EKG:  No ECG reviewed.  Recent Labs: 08/14/2019: BUN 13; Creatinine, Ser 1.33; Magnesium 1.8; Potassium 3.9; Sodium 135   Recent Lipid Panel No results found for: CHOL, TRIG, HDL, CHOLHDL, LDLCALC, LDLDIRECT  Wt Readings from Last 3 Encounters:  08/28/19 179 lb (81.2 kg)  08/14/19 177 lb (80.3 kg)  05/06/19 187 lb 3.2 oz (84.9 kg)     Objective:    Vital Signs:  Pulse 72   Ht 5\' 5"  (1.651 m)   Wt 179 lb (81.2 kg)   BMI 29.79 kg/m    VITAL SIGNS:  reviewed GEN:  no acute distress RESPIRATORY:  normal respiratory effort, symmetric expansion NEURO:  alert and oriented x 3, no obvious focal deficit PSYCH:  normal affect  ASSESSMENT & PLAN:    1.  Pre-Op evaluation:  Chart reviewed as part of pre-operative protocol coverage. Given past medical history and time since last visit, based on ACC/AHA guidelines, KAGEN KUNATH would be at acceptable risk for the planned procedure without further cardiovascular testing.  Okay to stop aspirin temporarily at the discretion of the surgeon, but will need to restart it at aspirin 325 mg daily thereafter.  2.  Hypertension: Controlled on last office visit.  He does not have a way of taking his blood pressure today.  He will continue diltiazem, irbesartan/HCTZ, and  isosorbide.  3.  Hypercholesterolemia: Continue atorvastatin 10 mg daily.  Goal of LDL less than 70 with PAD and coronary atherosclerosis.  Labs are drawn by PCP.  If not completed at time of next office visit he will need to have them drawn through our office.  4.  COPD: Followed by Dr. Annamaria Boots, pulmonologist.  He is awaiting pulmonary clearance to proceed with surgery as well.  5.  Peripheral arterial disease: History of femorofemoral bypass.  He is followed by Dr. Kellie Simmering.  He continues on aspirin 325 mg daily per his recommendation.   COVID-19 Education: The signs and symptoms of COVID-19 were discussed with the patient and how to seek care for testing (follow up with PCP or arrange E-visit).  The importance of social distancing was discussed today.  Time:   Today, I have spent 15 minutes with the patient with telehealth technology discussing the above problems.     Medication Adjustments/Labs and Tests Ordered: Current medicines are reviewed at length with the patient today.  Concerns regarding medicines are outlined above.   Tests Ordered: No orders of the defined types were placed in this encounter.   Medication Changes: No orders of the defined types were placed in this encounter.   Disposition:  Follow up with Dr. Debara Pickett in 3 to 4 months  Signed, Phill Myron. West Pugh, ANP, AACC  08/28/2019 1:57 PM    [facility]

## 2019-08-28 ENCOUNTER — Encounter: Payer: Self-pay | Admitting: Adult Health

## 2019-08-28 ENCOUNTER — Telehealth (INDEPENDENT_AMBULATORY_CARE_PROVIDER_SITE_OTHER): Payer: Medicare Other | Admitting: Adult Health

## 2019-08-28 VITALS — HR 72 | Ht 65.0 in | Wt 179.0 lb

## 2019-08-28 DIAGNOSIS — E7800 Pure hypercholesterolemia, unspecified: Secondary | ICD-10-CM | POA: Diagnosis not present

## 2019-08-28 DIAGNOSIS — I779 Disorder of arteries and arterioles, unspecified: Secondary | ICD-10-CM

## 2019-08-28 DIAGNOSIS — F17200 Nicotine dependence, unspecified, uncomplicated: Secondary | ICD-10-CM

## 2019-08-28 DIAGNOSIS — I10 Essential (primary) hypertension: Secondary | ICD-10-CM

## 2019-08-28 DIAGNOSIS — I2510 Atherosclerotic heart disease of native coronary artery without angina pectoris: Secondary | ICD-10-CM

## 2019-08-28 DIAGNOSIS — J449 Chronic obstructive pulmonary disease, unspecified: Secondary | ICD-10-CM

## 2019-08-28 DIAGNOSIS — Z01818 Encounter for other preprocedural examination: Secondary | ICD-10-CM

## 2019-08-28 MED ORDER — MAGNESIUM OXIDE -MG SUPPLEMENT 200 MG PO TABS: 200.0000 mg | ORAL_TABLET | Freq: Every day | ORAL | 3 refills | Status: DC

## 2019-08-28 NOTE — Patient Instructions (Addendum)
Medication Instructions:  DECREASE- Mag-Ox 200 mg by mouth daily  If you need a refill on your cardiac medications before your next appointment, please call your pharmacy.  Labwork: None Ordered   Testing/Procedures: None Ordered  Special Instructions: You are cleared for surgery  Follow-Up: .    Your physician recommends that you schedule a follow-up appointment in: 3-4 Months with Dr Debara Pickett   At [facility], you and your health needs are our priority.  As part of our continuing mission to provide you with exceptional heart care, we have created designated Provider Care Teams.  These Care Teams include your primary Cardiologist (physician) and Advanced Practice Providers (APPs -  Physician Assistants and Nurse Practitioners) who all work together to provide you with the care you need, when you need it.  Thank you for choosing [facility] at [facility]!!

## 2019-10-07 ENCOUNTER — Telehealth: Payer: Self-pay | Admitting: Internal Medicine

## 2019-10-08 NOTE — Telephone Encounter (Signed)
Left message for pt to call back  °

## 2019-10-14 ENCOUNTER — Ambulatory Visit: Payer: Medicare Other | Admitting: Podiatry

## 2019-10-15 NOTE — Telephone Encounter (Signed)
Pt never returned the call 

## 2019-10-31 ENCOUNTER — Encounter (HOSPITAL_COMMUNITY): Payer: Self-pay | Admitting: Physician Assistant

## 2019-10-31 NOTE — Patient Instructions (Addendum)
DUE TO COVID-19 ONLY ONE VISITOR IS ALLOWED TO COME WITH YOU AND STAY IN THE WAITING ROOM ONLY DURING PRE OP AND PROCEDURE DAY OF SURGERY. THE 1 VISITOR MAY VISIT WITH YOU AFTER SURGERY IN YOUR PRIVATE ROOM DURING VISITING HOURS ONLY!  YOU NEED TO HAVE A COVID 19 TEST ON_Friday 11/20/2020____ @_______ , THIS TEST MUST BE DONE BEFORE SURGERY, COME  [place]  , [id number].  ([place]) ONCE YOUR COVID TEST IS COMPLETED, PLEASE BEGIN THE QUARANTINE INSTRUCTIONS AS OUTLINED IN YOUR HANDOUT.                Dean [name]    Your procedure is scheduled on: Tuesday 11/12/2019   Report to [facility] Main  Entrance    Report to admitting at 7:35 am  bring cpap mask and tubing Call dr Mardelle Matte to see if stopping aspirin   Call this number if you have problems the morning of surgery [phone]    Remember: Do not eat food after Midnight.   BRUSH YOUR TEETH MORNING OF SURGERY AND RINSE YOUR MOUTH OUT, NO CHEWING GUM CANDY OR MINTS.  NO SOLID FOOD AFTER MIDNIGHT THE NIGHT PRIOR TO SURGERY.   NOTHING BY MOUTH EXCEPT CLEAR LIQUIDS UNTIL 7:05am.   PLEASE FINISH ENSURE DRINK PER SURGEON ORDER  WHICH NEEDS TO BE COMPLETED AT 7:05am.   CLEAR LIQUID DIET   Foods Allowed                                                                     Foods Excluded  Coffee and tea, regular and decaf                             liquids that you cannot  Plain Jell-O any favor except red or purple             see through such as: Fruit ices (not with fruit pulp)                                     milk, soups, orange juice  Iced Popsicles                                    All solid food Carbonated beverages, regular and diet                                    Cranberry, grape and apple juices Sports drinks like Gatorade Lightly seasoned clear broth or consume(fat free) Sugar, honey syrup  Sample Menu Breakfast                                Lunch                                      Supper Cranberry juice  Beef broth                            Chicken broth Jell-O                                     Grape juice                           Apple juice Coffee or tea                        Jell-O                                      Popsicle                                                Coffee or tea                        Coffee or tea  _____________________________________________________________________       Take these medicines the morning of surgery with A SIP OF WATER Albuterol inhaler if needed and bring to the hospital. Diltiazem (Tiazac) Nexium , albuterol nebulizer                                 You may not have any metal on your body including hair pins and              piercings  Do not wear jewelry, lotions, powders or colognes, deodorant             Men may shave face and neck.   Do not bring valuables to the hospital. [place].  Contacts, dentures or bridgework may not be worn into surgery.  Leave suitcase in the car. After surgery it may be brought to your room.                  Please read over the following fact sheets you were given: _____________________________________________________________________             [facility] - Preparing for Surgery Before surgery, you can play an important role.  Because skin is not sterile, your skin needs to be as free of germs as possible.  You can reduce the number of germs on your skin by washing with CHG (chlorahexidine gluconate) soap before surgery.  CHG is an antiseptic cleaner which kills germs and bonds with the skin to continue killing germs even after washing. Please DO NOT use if you have an allergy to CHG or antibacterial soaps.  If your skin becomes reddened/irritated stop using the CHG and inform your nurse when you arrive at Short Stay. Do not shave (including legs and underarms) for at least 48 hours  prior to the first CHG shower.  You may shave your face/neck. Please follow these instructions carefully:  1.  Shower with CHG Soap  the night before surgery and the  morning of Surgery.  2.  If you choose to wash your hair, wash your hair first as usual with your  normal  shampoo.  3.  After you shampoo, rinse your hair and body thoroughly to remove the  shampoo.                             4.  Use CHG as you would any other liquid soap.  You can apply chg directly  to the skin and wash                       Gently with a scrungie or clean washcloth.  5.  Apply the CHG Soap to your body ONLY FROM THE NECK DOWN.   Do not use on face/ open                           Wound or open sores. Avoid contact with eyes, ears mouth and genitals (private parts).                       Wash face,  Genitals (private parts) with your normal soap.             6.  Wash thoroughly, paying special attention to the area where your surgery  will be performed.  7.  Thoroughly rinse your body with warm water from the neck down.  8.  DO NOT shower/wash with your normal soap after using and rinsing off  the CHG Soap.                9.  Pat yourself dry with a clean towel.            10.  Wear clean pajamas.            11.  Place clean sheets on your bed the night of your first shower and do not  sleep with pets. Day of Surgery : Do not apply any lotions/deodorants the morning of surgery.  Please wear clean clothes to the hospital/surgery center.  FAILURE TO FOLLOW THESE INSTRUCTIONS MAY RESULT IN THE CANCELLATION OF YOUR SURGERY PATIENT SIGNATURE_________________________________  NURSE SIGNATURE__________________________________  ________________________________________________________________________   Dean [name]  An incentive spirometer is a tool that can help keep your lungs clear and active. This tool measures how well you are filling your lungs with each breath. Taking long deep breaths may help  reverse or decrease the chance of developing breathing (pulmonary) problems (especially infection) following:  A long period of time when you are unable to move or be active. BEFORE THE PROCEDURE   If the spirometer includes an indicator to show your best effort, your nurse or respiratory therapist will set it to a desired goal.  If possible, sit up straight or lean slightly forward. Try not to slouch.  Hold the incentive spirometer in an upright position. INSTRUCTIONS FOR USE  1. Sit on the edge of your bed if possible, or sit up as far as you can in bed or on a chair. 2. Hold the incentive spirometer in an upright position. 3. Breathe out normally. 4. Place the mouthpiece in your mouth and seal your lips tightly around it. 5. Breathe in slowly and as deeply as possible, raising the piston or the ball toward the top of the column. 6. Hold your breath  for 3-5 seconds or for as long as possible. Allow the piston or ball to fall to the bottom of the column. 7. Remove the mouthpiece from your mouth and breathe out normally. 8. Rest for a few seconds and repeat Steps 1 through 7 at least 10 times every 1-2 hours when you are awake. Take your time and take a few normal breaths between deep breaths. 9. The spirometer may include an indicator to show your best effort. Use the indicator as a goal to work toward during each repetition. 10. After each set of 10 deep breaths, practice coughing to be sure your lungs are clear. If you have an incision (the cut made at the time of surgery), support your incision when coughing by placing a pillow or rolled up towels firmly against it. Once you are able to get out of bed, walk around indoors and cough well. You may stop using the incentive spirometer when instructed by your caregiver.  RISKS AND COMPLICATIONS  Take your time so you do not get dizzy or light-headed.  If you are in pain, you may need to take or ask for pain medication before doing incentive  spirometry. It is harder to take a deep breath if you are having pain. AFTER USE  Rest and breathe slowly and easily.  It can be helpful to keep track of a log of your progress. Your caregiver can provide you with a simple table to help with this. If you are using the spirometer at home, follow these instructions: [place] IF:   You are having difficultly using the spirometer.  You have trouble using the spirometer as often as instructed.  Your pain medication is not giving enough relief while using the spirometer.  You develop fever of 100.5 F (38.1 C) or higher. SEEK IMMEDIATE MEDICAL CARE IF:   You cough up bloody sputum that had not been present before.  You develop fever of 102 F (38.9 C) or greater.  You develop worsening pain at or near the incision site. MAKE SURE YOU:   Understand these instructions.  Will watch your condition.  Will get help right away if you are not doing well or get worse. Document Released: 04/17/2007 Document Revised: 02/27/2012 Document Reviewed: 06/18/2007 [facility] Patient Information 2014 [place], [place].   ________________________________________________________________________

## 2019-11-04 ENCOUNTER — Other Ambulatory Visit (HOSPITAL_COMMUNITY): Payer: Self-pay | Admitting: *Deleted

## 2019-11-05 ENCOUNTER — Other Ambulatory Visit: Payer: Self-pay

## 2019-11-05 ENCOUNTER — Encounter (HOSPITAL_COMMUNITY): Payer: Self-pay

## 2019-11-05 ENCOUNTER — Encounter (HOSPITAL_COMMUNITY)
Admission: RE | Admit: 2019-11-05 | Discharge: 2019-11-05 | Disposition: A | Discharge status: Home / Self Care | Payer: Medicare Other | Source: Ambulatory Visit | Attending: Orthopedic Surgery | Admitting: Orthopedic Surgery

## 2019-11-05 DIAGNOSIS — Z87442 Personal history of urinary calculi: Secondary | ICD-10-CM | POA: Diagnosis not present

## 2019-11-05 DIAGNOSIS — E785 Hyperlipidemia, unspecified: Secondary | ICD-10-CM | POA: Insufficient documentation

## 2019-11-05 DIAGNOSIS — I4891 Unspecified atrial fibrillation: Secondary | ICD-10-CM | POA: Diagnosis not present

## 2019-11-05 DIAGNOSIS — R7303 Prediabetes: Secondary | ICD-10-CM | POA: Diagnosis not present

## 2019-11-05 DIAGNOSIS — K219 Gastro-esophageal reflux disease without esophagitis: Secondary | ICD-10-CM | POA: Diagnosis not present

## 2019-11-05 DIAGNOSIS — M1611 Unilateral primary osteoarthritis, right hip: Secondary | ICD-10-CM | POA: Diagnosis not present

## 2019-11-05 DIAGNOSIS — I2510 Atherosclerotic heart disease of native coronary artery without angina pectoris: Secondary | ICD-10-CM | POA: Diagnosis not present

## 2019-11-05 DIAGNOSIS — Z79899 Other long term (current) drug therapy: Secondary | ICD-10-CM | POA: Insufficient documentation

## 2019-11-05 DIAGNOSIS — G4730 Sleep apnea, unspecified: Secondary | ICD-10-CM | POA: Insufficient documentation

## 2019-11-05 DIAGNOSIS — I129 Hypertensive chronic kidney disease with stage 1 through stage 4 chronic kidney disease, or unspecified chronic kidney disease: Secondary | ICD-10-CM | POA: Diagnosis not present

## 2019-11-05 DIAGNOSIS — Z955 Presence of coronary angioplasty implant and graft: Secondary | ICD-10-CM | POA: Diagnosis not present

## 2019-11-05 DIAGNOSIS — Z01812 Encounter for preprocedural laboratory examination: Secondary | ICD-10-CM | POA: Insufficient documentation

## 2019-11-05 DIAGNOSIS — Z7982 Long term (current) use of aspirin: Secondary | ICD-10-CM | POA: Insufficient documentation

## 2019-11-05 DIAGNOSIS — N189 Chronic kidney disease, unspecified: Secondary | ICD-10-CM | POA: Diagnosis not present

## 2019-11-05 DIAGNOSIS — J449 Chronic obstructive pulmonary disease, unspecified: Secondary | ICD-10-CM | POA: Diagnosis not present

## 2019-11-05 DIAGNOSIS — I739 Peripheral vascular disease, unspecified: Secondary | ICD-10-CM | POA: Insufficient documentation

## 2019-11-05 HISTORY — DX: Personal history of urinary calculi: Z87.442

## 2019-11-05 HISTORY — DX: Chronic kidney disease, unspecified: N18.9

## 2019-11-05 HISTORY — DX: Other complications of anesthesia, initial encounter: T88.59XA

## 2019-11-05 LAB — BASIC METABOLIC PANEL
Anion gap: 9 (ref 5–15)
BUN: 32 mg/dL — ABNORMAL HIGH (ref 8–23)
CO2: 29 mmol/L (ref 22–32)
Calcium: 10 mg/dL (ref 8.9–10.3)
Chloride: 94 mmol/L — ABNORMAL LOW (ref 98–111)
Creatinine, Ser: 2.52 mg/dL — ABNORMAL HIGH (ref 0.61–1.24)
GFR calc Af Amer: 28 mL/min — ABNORMAL LOW (ref >60)
GFR calc non Af Amer: 24 mL/min — ABNORMAL LOW (ref >60)
Glucose, Bld: 81 mg/dL (ref 70–99)
Potassium: 4.5 mmol/L (ref 3.5–5.1)
Sodium: 132 mmol/L — ABNORMAL LOW (ref 135–145)

## 2019-11-05 LAB — SURGICAL PCR SCREEN
MRSA, PCR: NEGATIVE
Staphylococcus aureus: NEGATIVE

## 2019-11-05 LAB — CBC
HCT: 41.6 % (ref 39.0–52.0)
Hemoglobin: 14.1 g/dL (ref 13.0–17.0)
MCH: 31.3 pg (ref 26.0–34.0)
MCHC: 33.9 g/dL (ref 30.0–36.0)
MCV: 92.4 fL (ref 80.0–100.0)
Platelets: 330 10*3/uL (ref 150–400)
RBC: 4.5 MIL/uL (ref 4.22–5.81)
RDW: 13.3 % (ref 11.5–15.5)
WBC: 13 10*3/uL — ABNORMAL HIGH (ref 4.0–10.5)
nRBC: 0 % (ref 0.0–0.2)

## 2019-11-05 LAB — HEMOGLOBIN A1C
Hgb A1c MFr Bld: 6.6 % — ABNORMAL HIGH (ref 4.8–5.6)
Mean Plasma Glucose: 142.72 mg/dL

## 2019-11-05 NOTE — Progress Notes (Signed)
PCP - dr  [name] Cardiologist - dr Debara Pickett, cardiac clearance Curt Bears lawrence np 08-28-19 epic  televisit dr young pulmonary 11-06-19 for surgical clearance Chest x-ray - 05-06-19 epic EKG - 08-14-19 epic Stress Test - none ECHO - 08-23-19 epic Cardiac Cath - 1994  Sleep Study - 05-05-19 epic CPAP - yes sleep study 05-05-19 epic  Fasting Blood Sugar - does not check pre dm  Checks Blood Sugar _____ times a day  Blood Thinner Instructions: aspirin 325 mg, patient calling dr Mardelle Matte to check when to stop aspirin Aspirin Instructions: Last Dose:  Anesthesia review: chart to [place] [facility] pa for review  Patient denies shortness of breath, fever, cough and chest pain at PAT appointment   Patient verbalized understanding of instructions that were given to them at the PAT appointment. Patient was also instructed that they will need to review over the PAT instructions again at home before surgery.

## 2019-11-06 ENCOUNTER — Encounter: Payer: Self-pay | Admitting: Internal Medicine

## 2019-11-06 ENCOUNTER — Ambulatory Visit (INDEPENDENT_AMBULATORY_CARE_PROVIDER_SITE_OTHER): Payer: Medicare Other | Admitting: Internal Medicine

## 2019-11-06 DIAGNOSIS — G4733 Obstructive sleep apnea (adult) (pediatric): Secondary | ICD-10-CM

## 2019-11-06 DIAGNOSIS — F17200 Nicotine dependence, unspecified, uncomplicated: Secondary | ICD-10-CM | POA: Diagnosis not present

## 2019-11-06 DIAGNOSIS — R1013 Epigastric pain: Secondary | ICD-10-CM | POA: Diagnosis not present

## 2019-11-06 NOTE — Assessment & Plan Note (Signed)
Benefits with good compliance and control Plan- continue CPAP auto 5-20

## 2019-11-06 NOTE — Assessment & Plan Note (Signed)
Describes ongoing diarrhea. To continue working with GI.

## 2019-11-06 NOTE — Assessment & Plan Note (Signed)
Not able to make a quitting effort now. Encouraged to work toward this goal as his somatic discomforts improve

## 2019-11-06 NOTE — Progress Notes (Signed)
Anesthesia Chart Review   Case: [id number] Date/Time: 11/12/19 0950   Procedure: TOTAL HIP ARTHROPLASTY (Right Hip)   Anesthesia type: Choice   Pre-op diagnosis: djd right hip   Location: [place] / [facility]   Surgeon: Marchia Bond, MD      DISCUSSION:73 y.o. current every day smoker (70.5 pack years) with h/o HLD, HTN, PVD, COPD, GERD, A-fib, CAD, sleep apnea w/cpap, CKD, pre-diabetes, asthma, djd right hip scheduled for above procedure 11/05/2019 with Dr. Marchia Bond.    Pt seen by cardiology 08/28/2019 for preoperative evaluation.  Per OV note, "Chart reviewed as part of pre-operative protocol coverage. Given past medical history and time since last visit, based on ACC/AHA guidelines, CEPHAS REVARD would be at acceptable risk for the planned procedure without further cardiovascular testing.  Okay to stop aspirin temporarily at the discretion of the surgeon, but will need to restart it at aspirin 325 mg daily thereafter."  Elevated creatinine of 2.52 at PAT visit.  Forwarded to PCP.  Surgeon made aware of PCP evaluation before proceeding.  VS: BP 123/61   Pulse 69   Temp 36.8 C (Oral)   Resp 18   Ht 5\' 4"  (1.626 m)   Wt 71.2 kg   SpO2 93%   BMI 26.95 kg/m   PROVIDERS: [name], MD is PCP   Lyman Bishop, MD is Cardiologist   Baird Lyons, MD is Pulmonologist  LABS: Elevated creatinine (all labs ordered are listed, but only abnormal results are displayed)  Labs Reviewed  HEMOGLOBIN A1C - Abnormal; Notable for the following components:      Result Value   Hgb A1c MFr Bld 6.6 (*)    All other components within normal limits  BASIC METABOLIC PANEL - Abnormal; Notable for the following components:   Sodium 132 (*)    Chloride 94 (*)    BUN 32 (*)    Creatinine, Ser 2.52 (*)    GFR calc non Af Amer 24 (*)    GFR calc Af Amer 28 (*)    All other components within normal limits  CBC - Abnormal; Notable for the following components:   WBC 13.0 (*)    All  other components within normal limits  SURGICAL PCR SCREEN     IMAGES: Chest Xray 05/06/2019 IMPRESSION: 1. No acute cardiopulmonary disease. 2. Findings consistent with COPD/emphysema, stable from the prior study.  EKG: 08/14/2019 Rate 62 bpm Sinus rhythm with occasional premature ventricular complexes Rightward axis Incomplete right bundle branch block  Nonspecific ST abnormality Prolonged QT   CV: Echo 08/23/2019 IMPRESSIONS    1. The left ventricle has hyperdynamic systolic function, with an ejection fraction of >65%. The cavity size was normal. There is mild concentric left ventricular hypertrophy. Left ventricular diastolic Doppler parameters are consistent with impaired  relaxation.  2. The right ventricle has normal systolic function. The cavity was normal. There is no increase in right ventricular wall thickness. Right ventricular systolic pressure could not be assessed.  3. The aortic valve is tricuspid. Mild thickening of the aortic valve. Mild calcification of the aortic valve.  4. The aorta is normal unless otherwise noted  Myocardial Perfusion 12/31/2014 Overall Impression:  Low risk stress nuclear study with small, mild fixed inferoapical defect, likely artifact or small area of scar. The study was non-gated due to ectopy. Past Medical History:  Diagnosis Date  . Allergy    pollen  . Arthritis   . Asthma   . Atrial fibrillation ([place])  no documentation in epic/ pt states has never been told has A Fib  . CAD (coronary artery disease)    s/p angioplasty and stenting x3 1994  . Cataract    small, mild  . Chronic kidney disease    kidney cyst  . Complication of anesthesia    woke up during gall bladder sx yrs ago and with colonscopy  . COPD (chronic obstructive pulmonary disease) ([place])   . GERD (gastroesophageal reflux disease)   . History of kidney stones   . Hyperlipidemia   . Hypertension   . Neuromuscular disorder ([place])    nerve issues with back    . Peripheral vascular disease ([place])   . Pre-diabetes    none in last 6 months due to weight loss of 40 pounds  . PVC's (premature ventricular contractions)   . Sleep apnea    wears cpap    Past Surgical History:  Procedure Laterality Date  . APPENDECTOMY    . CHOLECYSTECTOMY  1990s   Gall Bladder  . COLONOSCOPY    . CORONARY ANGIOPLASTY WITH STENT PLACEMENT  1994  . EYE SURGERY Bilateral 2019   cataracts  . lumbar disectomy  2005   L3 - 4 - 5  . POLYPECTOMY    . PR VEIN BYPASS GRAFT,AORTO-FEM-POP  08/31/11   Right to Left Fem-Fem  . TONSILLECTOMY    . UMBILICAL HERNIA REPAIR      MEDICATIONS: . aspirin 325 MG tablet  . Magnesium 400 MG TABS  . albuterol (PROVENTIL HFA) 108 (90 Base) MCG/ACT inhaler  . albuterol (PROVENTIL) (2.5 MG/3ML) 0.083% nebulizer solution  . antiseptic oral rinse (BIOTENE) LIQD  . atorvastatin (LIPITOR) 10 MG tablet  . Azelastine-Fluticasone 137-50 MCG/ACT SUSP  . diltiazem (TIAZAC) 240 MG 24 hr capsule  . DM-GG-Chlorphen-Acetaminophen (CORICIDIN HBP DAY/NIGHT COLD PO)  . docusate sodium (COLACE) 100 MG capsule  . esomeprazole (NEXIUM) 40 MG capsule  . finasteride (PROSCAR) 5 MG tablet  . irbesartan-hydrochlorothiazide (AVALIDE) 150-12.5 MG tablet  . isosorbide mononitrate (IMDUR) 60 MG 24 hr tablet  . loperamide (IMODIUM A-D) 2 MG tablet  . traMADol (ULTRAM) 50 MG tablet  . Vitamin D, Ergocalciferol, (DRISDOL) 50000 UNITS CAPS   No current facility-administered medications for this encounter.      Maia Plan [facility] Pre-Surgical Testing [phone] 11/06/19  1:47 PM

## 2019-11-06 NOTE — Patient Instructions (Addendum)
We can continue CPAP auto 5-20, mask of choice, humidifier, supplies, AirView/ card  We can continue current breathing meds- call for refills if needed  Good luck getting the hip surgery and your GI problems straightened out- sorry I can't help with those.  Please call if I can help you.

## 2019-11-06 NOTE — Progress Notes (Signed)
Patient ID: Dean [name], male    DOB: 1946/04/28, 73 y.o.   MRN: [id number]  HPI M  Smoker followed for COPD, obstructive sleep apnea, lung nodule, allergic rhinitis. NPSG 04/06/1999   AHI 87/hr, desaturation to 83%, body weight 185 pounds PFT- /04/2009-severe obstructive airways disease with insignificant response to bronchodilator. FVC 3.18/83%, FEV1 1.53/56%, ratio 0.48, TLC 86%, DLCO 51% Office Spirometry 03/20/2017-very severe obstructive airways disease. 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 Minimizes cough and denies breathing changes. 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 He doesn't want surgery know- reluctant to get put in SNF with Covid risk. 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  11/06/2019-  Virtual Visit via Telephone Note  I connected with Dean [name] on 11/06/19 at 10:30 AM EST by telephone and verified that I am speaking with the correct person using two identifiers.  Location: Patient: H Provider: O   I discussed the limitations, risks, security and privacy concerns of performing an evaluation and management service by telephone and the availability of in person appointments. I also discussed with the patient that there may be a patient responsible charge related to this service. The patient expressed understanding and  agreed to proceed.   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 Sleeps poorly due to hip pain. Pending hip replacement next week and hopes to get this done before Covid rules change again at hospital.  Reports persistent diarrhea- being evaluated. He is concerned frequent use of Imodium affects kidney function.   Had flu vax.  Smoking steadily. Little cough. No acute breathing issues.  albut hfa, neb albut, Dymista,    Observations/Objective: CXR 05/06/2019- IMPRESSION: 1. No acute cardiopulmonary disease. 2. Findings consistent with COPD/emphysema, stable from the prior study.  ECHO 9/4- EF 65%, LVH, no evidence of PAH  Assessment and Plan: OSA- benefits with good compliance- to continue auto 5-20/ [employer] Tobacco abuse- Urgently needs to stop smoking. Diarrhea- managed elsewhere.- discuss with PCP. ? ischemic?   Follow Up Instructions: 6 months   I discussed the assessment and treatment plan with the patient. The patient was provided an opportunity to ask questions and all were answered. The patient agreed with the plan and demonstrated an understanding of the instructions.   The patient was advised to call back or seek an in-person evaluation if the symptoms worsen or if the condition fails to improve as anticipated.  I provided 18 minutes of non-face-to-face time during this encounter.   Baird Lyons, MD   ROS-see HPI  + = positive Constitutional:   No-   weight loss, night sweats, fevers, chills, fatigue, lassitude. HEENT:   No-  headaches, difficulty swallowing, tooth/dental problems, sore throat,  No-  sneezing, itching, ear ache, +nasal congestion, post nasal drip,  CV:  No-   chest pain, orthopnea, PND,  swelling in lower extremities, anasarca,  dizziness, palpitations Resp: + shortness of breath with exertion or at rest.           +   productive cough,  + non-productive cough,  No- coughing up of blood.              No-   change in color of mucus.  + wheezing.   Skin: No-   rash or lesions. GI:  No-   heartburn, indigestion, abdominal pain, nausea, vomiting, + diarrhea GU:  MS:  + joint pain or swelling, + back pain . Neuro-     nothing unusual Psych:  No- change in mood or affect. No depression or anxiety.  No memory loss.  Objective:  OBJ- Physical Exam General- Alert, Oriented, Affect-appropriate, Distress- none acute. + Odor of tobacco, + overweight Skin- rash-none, lesions- none, excoriation- none Lymphadenopathy- none Head- atraumatic            Eyes- Gross vision intact, PERRLA, conjunctivae and secretions clear            Ears- Hearing, canals-normal            Nose- + Turbinate edema, no-Septal dev, mucus, polyps, erosion, perforation             Throat- Mallampati III , mucosa clear , drainage- none, tonsils- atrophic Neck- flexible , trachea midline, no stridor , thyroid nl, carotid no bruit, + mass left lateral neck/cheek Chest - symmetrical excursion , unlabored           Heart/CV- RRR , no murmur , no gallop  , no rub, nl s1 s2                           - JVD- none , edema- none, stasis changes- none, varices- none           Lung- + diminished, wheeze- none,  Cough none, dullness-none, rub- none           Chest wall-  Abd-  Br/ Gen/ Rectal- Not done, not indicated Extrem- cyanosis- none, clubbing, none, atrophy- none, strength- nl + Cane Neuro- grossly intact to observation

## 2019-11-08 ENCOUNTER — Other Ambulatory Visit (HOSPITAL_COMMUNITY)
Admission: RE | Admit: 2019-11-08 | Discharge: 2019-11-08 | Disposition: A | Discharge status: Home / Self Care | Payer: Medicare Other | Source: Ambulatory Visit | Attending: Orthopedic Surgery | Admitting: Orthopedic Surgery

## 2019-11-08 DIAGNOSIS — Z01812 Encounter for preprocedural laboratory examination: Secondary | ICD-10-CM | POA: Diagnosis present

## 2019-11-08 DIAGNOSIS — Z20828 Contact with and (suspected) exposure to other viral communicable diseases: Secondary | ICD-10-CM | POA: Insufficient documentation

## 2019-11-09 ENCOUNTER — Encounter (HOSPITAL_COMMUNITY): Payer: Self-pay

## 2019-11-09 ENCOUNTER — Emergency Department (HOSPITAL_COMMUNITY)
Admission: EM | Admit: 2019-11-09 | Discharge: 2019-11-10 | Disposition: A | Discharge status: Home / Self Care | Payer: Medicare Other | Attending: Emergency Medicine | Admitting: Emergency Medicine

## 2019-11-09 ENCOUNTER — Other Ambulatory Visit: Payer: Self-pay

## 2019-11-09 DIAGNOSIS — R339 Retention of urine, unspecified: Secondary | ICD-10-CM | POA: Diagnosis not present

## 2019-11-09 DIAGNOSIS — F17210 Nicotine dependence, cigarettes, uncomplicated: Secondary | ICD-10-CM | POA: Diagnosis not present

## 2019-11-09 DIAGNOSIS — R1030 Lower abdominal pain, unspecified: Secondary | ICD-10-CM | POA: Insufficient documentation

## 2019-11-09 DIAGNOSIS — I129 Hypertensive chronic kidney disease with stage 1 through stage 4 chronic kidney disease, or unspecified chronic kidney disease: Secondary | ICD-10-CM | POA: Insufficient documentation

## 2019-11-09 DIAGNOSIS — J45909 Unspecified asthma, uncomplicated: Secondary | ICD-10-CM | POA: Insufficient documentation

## 2019-11-09 DIAGNOSIS — Z7982 Long term (current) use of aspirin: Secondary | ICD-10-CM | POA: Insufficient documentation

## 2019-11-09 DIAGNOSIS — J449 Chronic obstructive pulmonary disease, unspecified: Secondary | ICD-10-CM | POA: Diagnosis not present

## 2019-11-09 DIAGNOSIS — N189 Chronic kidney disease, unspecified: Secondary | ICD-10-CM | POA: Diagnosis not present

## 2019-11-09 DIAGNOSIS — R338 Other retention of urine: Secondary | ICD-10-CM

## 2019-11-09 DIAGNOSIS — Z79899 Other long term (current) drug therapy: Secondary | ICD-10-CM | POA: Diagnosis not present

## 2019-11-09 NOTE — ED Triage Notes (Signed)
Pt BIB [facility] from home reporting urinary retention. Reports recent elevated kidney function. States that he has been "dribbling" since Tuesday. Denies pain unless he is pushing to urinate. A&Ox4. Hx of COPD and chronic cough.

## 2019-11-10 LAB — BASIC METABOLIC PANEL
Anion gap: 9 (ref 5–15)
BUN: 24 mg/dL — ABNORMAL HIGH (ref 8–23)
CO2: 28 mmol/L (ref 22–32)
Calcium: 9.8 mg/dL (ref 8.9–10.3)
Chloride: 91 mmol/L — ABNORMAL LOW (ref 98–111)
Creatinine, Ser: 1.83 mg/dL — ABNORMAL HIGH (ref 0.61–1.24)
GFR calc Af Amer: 42 mL/min — ABNORMAL LOW (ref >60)
GFR calc non Af Amer: 36 mL/min — ABNORMAL LOW (ref >60)
Glucose, Bld: 76 mg/dL (ref 70–99)
Potassium: 3.9 mmol/L (ref 3.5–5.1)
Sodium: 128 mmol/L — ABNORMAL LOW (ref 135–145)

## 2019-11-10 LAB — CBC WITH DIFFERENTIAL/PLATELET
Abs Immature Granulocytes: 0.13 10*3/uL — ABNORMAL HIGH (ref 0.00–0.07)
Basophils Absolute: 0.1 10*3/uL (ref 0.0–0.1)
Basophils Relative: 1 %
Eosinophils Absolute: 0.2 10*3/uL (ref 0.0–0.5)
Eosinophils Relative: 2 %
HCT: 39.6 % (ref 39.0–52.0)
Hemoglobin: 13.8 g/dL (ref 13.0–17.0)
Immature Granulocytes: 1 %
Lymphocytes Relative: 17 %
Lymphs Abs: 1.5 10*3/uL (ref 0.7–4.0)
MCH: 31.5 pg (ref 26.0–34.0)
MCHC: 34.8 g/dL (ref 30.0–36.0)
MCV: 90.4 fL (ref 80.0–100.0)
Monocytes Absolute: 1 10*3/uL (ref 0.1–1.0)
Monocytes Relative: 11 %
Neutro Abs: 6.2 10*3/uL (ref 1.7–7.7)
Neutrophils Relative %: 68 %
Platelets: 292 10*3/uL (ref 150–400)
RBC: 4.38 MIL/uL (ref 4.22–5.81)
RDW: 13.2 % (ref 11.5–15.5)
WBC: 9.1 10*3/uL (ref 4.0–10.5)
nRBC: 0 % (ref 0.0–0.2)

## 2019-11-10 LAB — URINALYSIS, ROUTINE W REFLEX MICROSCOPIC
Bilirubin Urine: NEGATIVE
Glucose, UA: NEGATIVE mg/dL
Ketones, ur: NEGATIVE mg/dL
Leukocytes,Ua: NEGATIVE
Nitrite: NEGATIVE
Protein, ur: NEGATIVE mg/dL
RBC / HPF: >50 RBC/hpf — ABNORMAL HIGH (ref 0–5)
Specific Gravity, Urine: 1.006 (ref 1.005–1.030)
pH: 5 (ref 5.0–8.0)

## 2019-11-10 LAB — NOVEL CORONAVIRUS, NAA (HOSP ORDER, SEND-OUT TO REF LAB; TAT 18-24 HRS): SARS-CoV-2, NAA: NOT DETECTED

## 2019-11-10 MED ORDER — MORPHINE SULFATE (PF) 4 MG/ML IV SOLN
4.0000 mg | Freq: Once | INTRAVENOUS | Status: AC
  Administered 2019-11-10: 4 mg via INTRAVENOUS
  Filled 2019-11-10: qty 1

## 2019-11-10 NOTE — ED Provider Notes (Signed)
[facility] DEPT Provider Note   CSN: [id number] Arrival date & time: 11/09/19  2257     History   Chief Complaint Chief Complaint  Patient presents with  . Urinary Retention    HPI Dean [name] is a 73 y.o. male.     Patient is a 73 year old male with extensive past medical history including coronary artery disease, atrial fibrillation, asthma, hypertension.  He presents today for evaluation of urinary retention.  Patient states that he has felt like he has had to urinate all day, but has been unable to.  He describes some lower abdominal discomfort.  He denies any fevers or chills.  Patient was seen by his primary care doctor and had blood work several days ago.  This showed worsening renal function with a creatinine of 2.5.  Patient denies to me he is experiencing any fevers or chills.  The history is provided by the patient.    Past Medical History:  Diagnosis Date  . Allergy    pollen  . Arthritis   . Asthma   . Atrial fibrillation ([place])    no documentation in epic/ pt states has never been told has A Fib  . CAD (coronary artery disease)    s/p angioplasty and stenting x3 1994  . Cataract    small, mild  . Chronic kidney disease    kidney cyst  . Complication of anesthesia    woke up during gall bladder sx yrs ago and with colonscopy  . COPD (chronic obstructive pulmonary disease) ([place])   . GERD (gastroesophageal reflux disease)   . History of kidney stones   . Hyperlipidemia   . Hypertension   . Neuromuscular disorder ([place])    nerve issues with back   . Peripheral vascular disease ([place])   . Pre-diabetes    none in last 6 months due to weight loss of 40 pounds  . PVC's (premature ventricular contractions)   . Sleep apnea    wears cpap    Patient Active Problem List   Diagnosis Date Noted  . 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  . Atherosclerosis of native coronary artery of native heart without angina pectoris  01/30/2018  . PAOD (peripheral arterial occlusive disease) ([place]) 01/30/2018  . COPD with chronic bronchitis ([place]) 01/30/2018  . Parotid mass 03/26/2017  . Status post bypass graft of extremity 02/06/2017  . Back pain of thoracolumbar region 03/31/2015  . SOB (shortness of breath) 12/25/2014  . PVC's (premature ventricular contractions) 12/25/2014  . Abnormal EKG 12/25/2014  . PAD (peripheral artery disease) ([place]) 12/25/2014  . Dyslipidemia 12/25/2014  . Current smoker 12/25/2014  . Aftercare following surgery of the circulatory system, [place] 12/23/2013  . Screening for depression 11/06/2013  . Cough 06/24/2013  . 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 Blood glucose elevated 11/02/2012  . Candidal dermatitis 11/02/2011  . Adiposity 07/11/2011  . Accumulated fluid in larynx 09/24/2010  . Atypical chest pain 09/23/2010  . Lumbar canal stenosis 08/05/2009  . COPD mixed type ([place]) 04/15/2009  . TOBACCO USER 03/10/2009  . Allergic rhinitis 03/10/2009  . GERD 05/20/2008  . ABDOMINAL PAIN-EPIGASTRIC 05/20/2008  . HYPERLIPIDEMIA 04/11/2008  . Essential hypertension 04/11/2008  . Coronary atherosclerosis 04/11/2008  . PROCTITIS 04/11/2008  . Obstructive sleep apnea 03/01/2008  . Lung nodule 02/29/2008  . RENAL CYST, LEFT 06/15/2007  . COLONIC POLYPS, ADENOMATOUS 08/11/2005  . DIVERTICULOSIS, COLON 08/11/2005  . INTERNAL HEMORRHOIDS 06/04/2002    Past  Surgical History:  Procedure Laterality Date  . APPENDECTOMY    . CHOLECYSTECTOMY  1990s   Gall Bladder  . COLONOSCOPY    . CORONARY ANGIOPLASTY WITH STENT PLACEMENT  1994  . EYE SURGERY Bilateral 2019   cataracts  . lumbar disectomy  2005   L3 - 4 - 5  . POLYPECTOMY    . PR VEIN BYPASS GRAFT,AORTO-FEM-POP  08/31/11   Right to Left Fem-Fem  . TONSILLECTOMY    . UMBILICAL HERNIA REPAIR          Home Medications    Prior to Admission medications   Medication Sig Start Date End Date  Taking? Authorizing Provider  albuterol (PROVENTIL HFA) 108 (90 Base) MCG/ACT inhaler Inhale 1 puff into the lungs 2 (two) times daily. Patient taking differently: Inhale 1 puff into the lungs every 6 (six) hours as needed for wheezing or shortness of breath.  07/03/19  Yes Young, Tarri Fuller D, MD  albuterol (PROVENTIL) (2.5 MG/3ML) 0.083% nebulizer solution USE 1 VIAL VIA NEBULIZER EVERY 6 HOURS AS NEEDED FOR WHEEZING OR SHORTNESS OF BREATH Patient taking differently: Take 2.5 mg by nebulization every 6 (six) hours as needed for wheezing.  07/03/19  Yes Young, Tarri Fuller D, MD  antiseptic oral rinse (BIOTENE) LIQD 15 mLs by Mouth Rinse route as needed for dry mouth.   Yes [provider]  atorvastatin (LIPITOR) 10 MG tablet Take 10 mg by mouth every evening.    Yes [provider]  Azelastine-Fluticasone 137-50 MCG/ACT SUSP PLACE 1-2 SPRAYS INTO EACH NOSTRIL AT BEDTIME Patient taking differently: Place 1-2 sprays into the nose at bedtime.  07/22/19  Yes Young, Tarri Fuller D, MD  diltiazem (TIAZAC) 240 MG 24 hr capsule Take 240 mg by mouth daily.  07/17/19  Yes [provider]  DM-GG-Chlorphen-Acetaminophen (CORICIDIN HBP DAY/NIGHT COLD PO) Take 1 tablet by mouth 2 (two) times daily as needed (cold symptoms).   Yes [provider]  docusate sodium (COLACE) 100 MG capsule Take 100 mg by mouth daily as needed for mild constipation.   Yes [provider]  esomeprazole (NEXIUM) 40 MG capsule Take 40 mg by mouth See admin instructions. Pt takes for 14 days; and off for 14 days 02/07/19  Yes [provider]  finasteride (PROSCAR) 5 MG tablet Take 5 mg by mouth every other day. In pm 09/14/12  Yes [provider]  fluticasone (FLONASE) 50 MCG/ACT nasal spray Place 1 spray into both nostrils daily as needed for allergies or rhinitis.   Yes [provider]  hydroxypropyl methylcellulose / hypromellose (ISOPTO TEARS / GONIOVISC) 2.5 % ophthalmic solution  Place 1 drop into both eyes 3 (three) times daily as needed for dry eyes.   Yes [provider]  irbesartan-hydrochlorothiazide (AVALIDE) 150-12.5 MG tablet Take 1 tablet by mouth daily. Patient taking differently: Take 1 tablet by mouth every evening.  08/28/19  Yes Hilty, Nadean Corwin, MD  isosorbide mononitrate (IMDUR) 60 MG 24 hr tablet Take 60 mg by mouth every evening.    Yes [provider]  loperamide (IMODIUM A-D) 2 MG tablet Take 2 mg by mouth 4 (four) times daily as needed for diarrhea or loose stools.   Yes [provider]  Magnesium 400 MG TABS Take 400 mg by mouth every other day.    Yes [provider]  pseudoephedrine-acetaminophen (TYLENOL SINUS) 30-500 MG TABS tablet Take 1 tablet by mouth every 4 (four) hours as needed (sinus congestion).   Yes [provider]  traMADol (ULTRAM) 50 MG tablet Take 50 mg by mouth 2 (two) times daily as needed (pain).  11/06/13  Yes [provider]  Vitamin D, Ergocalciferol, (DRISDOL) 50000 UNITS CAPS Take 50,000 Units by mouth Once a week. Fridays 07/03/12  Yes [provider]  aspirin 325 MG tablet Take 325 mg by mouth daily.    [provider]    Family History Family History  Problem Relation Age of Onset  . Other Father        ETOH  . Diabetes Father   . Hypertension Father   . Kidney cancer Mother   . Cancer Mother        renal  . Hyperlipidemia Mother   . Hypertension Mother   . Cancer Sister        breast  . Hyperlipidemia Sister   . Hypertension Sister   . Breast cancer Sister   . Hyperlipidemia Brother   . Hypertension Brother   . Prostate cancer Brother   . Colon cancer Neg Hx   . Colon polyps Neg Hx   . Esophageal cancer Neg Hx   . Rectal cancer Neg Hx   . Stomach cancer Neg Hx     Social History Social History   Tobacco Use  . Smoking status: Current Every Day Smoker    Packs/day: 1.50    Years: 47.00    Pack years: 70.50    Types: Cigarettes   . Smokeless tobacco: Never Used  Substance Use Topics  . Alcohol use: Not Currently    Alcohol/week: 0.0 standard drinks  . Drug use: No     Allergies   Penicillins, Mold extract [trichophyton], and Other   Review of Systems Review of Systems  All other systems reviewed and are negative.    Physical Exam Updated Vital Signs BP (!) 146/71 (BP Location: Right Arm)   Pulse 76   Temp 98.4 F (36.9 C) (Oral)   Resp 18   SpO2 93%   Physical Exam Vitals signs and nursing note reviewed.  Constitutional:      General: He is not in acute distress.    Appearance: He is well-developed. He is not diaphoretic.     Comments: Patient is awake and alert.  He is in no distress.  He does seem to have some difficulty with recalling events.  HENT:     Head: Normocephalic and atraumatic.  Neck:     Musculoskeletal: Normal range of motion and neck supple.  Cardiovascular:     Rate and Rhythm: Normal rate and regular rhythm.     Heart sounds: No murmur. No friction rub.  Pulmonary:     Effort: Pulmonary effort is normal. No respiratory distress.     Breath sounds: Normal breath sounds. No wheezing or rales.  Abdominal:     General: Bowel sounds are normal. There is no distension.     Palpations: Abdomen is soft.     Tenderness: There is no abdominal tenderness.  Musculoskeletal: Normal range of motion.  Skin:    General: Skin is warm and dry.  Neurological:     Mental Status: He is alert and oriented to person, place, and time.     Coordination: Coordination normal.      ED Treatments / Results  Labs (all labs ordered are listed, but only abnormal results are displayed) Labs Reviewed  BASIC METABOLIC PANEL  CBC WITH DIFFERENTIAL/PLATELET  URINALYSIS, ROUTINE W REFLEX MICROSCOPIC    EKG None  Radiology No results found.  Procedures Procedures (including critical care time)  Medications Ordered in ED Medications - No data to display   Initial Impression /  Assessment and Plan / ED Course  I have reviewed the triage vital signs and the nursing notes.  Pertinent labs & imaging results that were available during my care of the patient were reviewed by me and considered in my medical decision making (see chart for details).  Patient presents here with complaints of not urinating all day.  Patient feels as though his he needs to void, but cannot.    A Foley catheter was placed here in the ER with only roughly 200 cc of urine obtained.  Urinalysis is not consistent with infection.  Patient has no white count and his CBC is otherwise unremarkable.  Basic metabolic panel only shows a mild renal insufficiency with creatinine of 1.8.  3 days ago in the doctor's office it was 2.5, thus improving.  I see no indication for admission at this time.  I will advised the patient to follow-up with his primary doctor and return to the ER if his symptoms worsen or change.  Final Clinical Impressions(s) / ED Diagnoses   Final diagnoses:  None    ED Discharge Orders    None       Veryl Speak, MD 11/10/19 [phone]

## 2019-11-10 NOTE — Discharge Instructions (Addendum)
Continue medications as previously prescribed.  Return to the emergency department if your symptoms significantly worsen or change.  Follow-up with your primary doctor in the next week.

## 2019-11-10 NOTE — ED Notes (Signed)
[facility] contacted for transport and paperwork printed.

## 2019-11-11 ENCOUNTER — Other Ambulatory Visit (HOSPITAL_COMMUNITY): Payer: Self-pay | Admitting: Internal Medicine

## 2019-11-11 DIAGNOSIS — I150 Renovascular hypertension: Secondary | ICD-10-CM

## 2019-11-11 DIAGNOSIS — R338 Other retention of urine: Secondary | ICD-10-CM

## 2019-11-11 DIAGNOSIS — R3129 Other microscopic hematuria: Secondary | ICD-10-CM

## 2019-11-11 NOTE — Progress Notes (Signed)
Spoke with patient by phone arrive 1030 am and clear liquids until 1000 am drink at 1000

## 2019-11-12 ENCOUNTER — Encounter (HOSPITAL_COMMUNITY): Admission: RE | Payer: Self-pay | Source: Other Acute Inpatient Hospital

## 2019-11-12 ENCOUNTER — Other Ambulatory Visit: Payer: Self-pay

## 2019-11-12 ENCOUNTER — Ambulatory Visit (HOSPITAL_COMMUNITY)
Admission: RE | Admit: 2019-11-12 | Discharge: 2019-11-12 | Disposition: A | Discharge status: Home / Self Care | Payer: Medicare Other | Source: Ambulatory Visit | Attending: Internal Medicine | Admitting: Internal Medicine

## 2019-11-12 ENCOUNTER — Inpatient Hospital Stay (HOSPITAL_COMMUNITY)
Admission: RE | Admit: 2019-11-12 | Payer: Medicare Other | Source: Other Acute Inpatient Hospital | Admitting: Orthopedic Surgery

## 2019-11-12 DIAGNOSIS — R3129 Other microscopic hematuria: Secondary | ICD-10-CM | POA: Diagnosis present

## 2019-11-12 DIAGNOSIS — R338 Other retention of urine: Secondary | ICD-10-CM | POA: Diagnosis not present

## 2019-11-12 SURGERY — ARTHROPLASTY, HIP, TOTAL,POSTERIOR APPROACH
Anesthesia: Choice | Site: Hip | Laterality: Right

## 2019-11-13 ENCOUNTER — Ambulatory Visit (HOSPITAL_COMMUNITY)
Admission: RE | Admit: 2019-11-13 | Discharge: 2019-11-13 | Disposition: A | Discharge status: Home / Self Care | Payer: Medicare Other | Source: Ambulatory Visit | Attending: Internal Medicine | Admitting: Internal Medicine

## 2019-11-13 DIAGNOSIS — I150 Renovascular hypertension: Secondary | ICD-10-CM | POA: Diagnosis not present

## 2019-11-13 DIAGNOSIS — I701 Atherosclerosis of renal artery: Secondary | ICD-10-CM

## 2019-11-13 NOTE — Progress Notes (Signed)
Renal artery duplex completed. Refer to "CV Proc" under chart review to view preliminary results.  11/13/2019 9:49 AM Kelby Aline., MHA, RVT, RDCS, RDMS

## 2019-11-27 ENCOUNTER — Ambulatory Visit: Payer: Medicare Other | Admitting: Vascular Surgery

## 2019-11-27 ENCOUNTER — Other Ambulatory Visit: Payer: Self-pay

## 2019-11-28 ENCOUNTER — Ambulatory Visit: Payer: Medicare Other | Admitting: Vascular Surgery

## 2019-11-28 ENCOUNTER — Other Ambulatory Visit: Payer: Self-pay

## 2019-11-28 ENCOUNTER — Encounter: Payer: Self-pay | Admitting: Vascular Surgery

## 2019-11-28 VITALS — BP 114/67 | HR 50 | Temp 97.8°F | Resp 20 | Ht 64.0 in | Wt 157.0 lb

## 2019-11-28 DIAGNOSIS — I701 Atherosclerosis of renal artery: Secondary | ICD-10-CM | POA: Diagnosis not present

## 2019-11-28 DIAGNOSIS — I150 Renovascular hypertension: Secondary | ICD-10-CM | POA: Diagnosis not present

## 2019-11-28 NOTE — Progress Notes (Signed)
Referring Physician: Dr. Reynaldo Minium  Patient name: TANIA PERROTT MRN: [id number] DOB: 05/30/1946 Sex: male  REASON FOR CONSULT: Renal artery stenosis  HPI: WILHELM GANAWAY is a 73 y.o. male, who has previously undergone femoral-femoral bypass by my partner Dr. Kellie Simmering many years ago.  Recently he was scheduled to have a total hip replacement and was noted to have an elevated creatinine.  He was sent back to Dr. Reynaldo Minium for evaluation of this.  Renal duplex scan suggested left renal artery stenosis.  Patient's creatinine in November of this year was 1.8.  Of note there was also some hydronephrosis on that duplex scan.  Patient denies any significant change in his blood pressure.  He does not have any claudication symptoms.  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  He currently is on 3 blood pressure medications as well as isosorbide mononitrate.  He is also on aspirin and statin.  He does smoke.     Past Medical History:  Diagnosis Date  . Allergy    pollen  . Arthritis   . Asthma   . Atrial fibrillation ([place])    no documentation in epic/ pt states has never been told has A Fib  . CAD (coronary artery disease)    s/p angioplasty and stenting x3 1994  . Cataract    small, mild  . Chronic kidney disease    kidney cyst  . Complication of anesthesia    woke up during gall bladder sx yrs ago and with colonscopy  . COPD (chronic obstructive pulmonary disease) ([place])   . GERD (gastroesophageal reflux disease)   . History of kidney stones   . Hyperlipidemia   . Hypertension   . Neuromuscular disorder ([place])    nerve issues with back   . Peripheral vascular disease ([place])   . Pre-diabetes    none in last 6 months due to weight loss of 40 pounds  . PVC's (premature ventricular contractions)   . Sleep apnea    wears cpap   Past Surgical History:  Procedure Laterality Date  .  APPENDECTOMY    . CHOLECYSTECTOMY  1990s   Gall Bladder  . COLONOSCOPY    . CORONARY ANGIOPLASTY WITH STENT PLACEMENT  1994  . EYE SURGERY Bilateral 2019   cataracts  . lumbar disectomy  2005   L3 - 4 - 5  . POLYPECTOMY    . PR VEIN BYPASS GRAFT,AORTO-FEM-POP  08/31/11   Right to Left Fem-Fem  . TONSILLECTOMY    . UMBILICAL HERNIA REPAIR      Family History  Problem Relation Age of Onset  . Other Father        ETOH  . Diabetes Father   . Hypertension Father   . Kidney cancer Mother   . Cancer Mother        renal  . Hyperlipidemia Mother   . Hypertension Mother   . Cancer Sister        breast  . Hyperlipidemia Sister   . Hypertension Sister   . Breast cancer Sister   . Hyperlipidemia Brother   . Hypertension Brother   . Prostate cancer Brother   . Colon cancer Neg Hx   . Colon polyps Neg Hx   . Esophageal cancer Neg Hx   . Rectal cancer Neg Hx   . Stomach cancer Neg Hx     SOCIAL  HISTORY: Social History   Socioeconomic History  . Marital status: Single    Spouse name: Not on file  . Number of children: Not on file  . Years of education: Not on file  . Highest education level: Not on file  Occupational History  . 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 Smoking status: Current Every Day Smoker    Packs/day: 1.00    Years: 47.00    Pack years: 47.00    Types: Cigarettes  . Smokeless tobacco: Never Used  Substance and Sexual Activity  . Alcohol use: Not Currently    Alcohol/week: 0.0 standard drinks  . Drug use: No  . Sexual activity: Not on file  Other Topics Concern  . Not on file  Social History Narrative   Travelled to [place] and [place] 2008, [place] 2009   Social Determinants of Health   Financial Resource Strain:   . Difficulty of Paying Living Expenses: Not on file  Food Insecurity:   . Worried About Charity fundraiser in the Last Year: Not on file  . Ran Out of Food in the Last Year: Not on file    Transportation Needs:   . Lack of Transportation (Medical): Not on file  . Lack of Transportation (Non-Medical): Not on file  Physical Activity:   . Days of Exercise per Week: Not on file  . Minutes of Exercise per Session: Not on file  Stress:   . Feeling of Stress : Not on file  Social Connections:   . Frequency of Communication with Friends and Family: Not on file  . Frequency of Social Gatherings with Friends and Family: Not on file  . Attends Religious Services: Not on file  . Active Member of Clubs or Organizations: Not on file  . Attends Archivist Meetings: Not on file  . Marital Status: Not on file  Intimate Partner Violence:   . Fear of Current or Ex-Partner: Not on file  . Emotionally Abused: Not on file  . Physically Abused: Not on file  . Sexually Abused: Not on file    Allergies  Allergen Reactions  . Penicillins Palpitations    REACTION: rapid pulse Did it involve swelling of the face/tongue/throat, SOB, or low BP? Yes Did it involve sudden or severe rash/hives, skin peeling, or any reaction on the inside of your mouth or nose? Unknown Did you need to seek medical attention at a hospital or doctor's office? yes When did it last happen?childhood  If all above answers are "NO", may proceed with cephalosporin use.   . Mold Extract [Trichophyton]     rash  . Other     Oily seafood salmon chest pain    Current Outpatient Medications  Medication Sig Dispense Refill  . albuterol (PROVENTIL HFA) 108 (90 Base) MCG/ACT inhaler Inhale 1 puff into the lungs 2 (two) times daily. (Patient taking differently: Inhale 1 puff into the lungs every 6 (six) hours as needed for wheezing or shortness of breath. ) 8 g 5  . albuterol (PROVENTIL) (2.5 MG/3ML) 0.083% nebulizer solution USE 1 VIAL VIA NEBULIZER EVERY 6 HOURS AS NEEDED FOR WHEEZING OR SHORTNESS OF BREATH (Patient taking differently: Take 2.5 mg by nebulization every 6 (six) hours as needed for wheezing. )  225 mL 12  . antiseptic oral rinse (BIOTENE) LIQD 15 mLs by Mouth Rinse route as needed for dry mouth.    Marland Kitchen aspirin 325 MG tablet Take 325 mg by  mouth daily.    Marland Kitchen atorvastatin (LIPITOR) 10 MG tablet Take 10 mg by mouth every evening.     . Azelastine-Fluticasone 137-50 MCG/ACT SUSP PLACE 1-2 SPRAYS INTO EACH NOSTRIL AT BEDTIME (Patient taking differently: Place 1-2 sprays into the nose at bedtime. ) 23 g 3  . diltiazem (TIAZAC) 240 MG 24 hr capsule Take 240 mg by mouth daily.     Marland Kitchen DM-GG-Chlorphen-Acetaminophen (CORICIDIN HBP DAY/NIGHT COLD PO) Take 1 tablet by mouth 2 (two) times daily as needed (cold symptoms).    . docusate sodium (COLACE) 100 MG capsule Take 100 mg by mouth daily as needed for mild constipation.    Marland Kitchen esomeprazole (NEXIUM) 40 MG capsule Take 40 mg by mouth See admin instructions. Pt takes for 14 days; and off for 14 days    . finasteride (PROSCAR) 5 MG tablet Take 5 mg by mouth every other day. In pm    . fluticasone (FLONASE) 50 MCG/ACT nasal spray Place 1 spray into both nostrils daily as needed for allergies or rhinitis.    . hydroxypropyl methylcellulose / hypromellose (ISOPTO TEARS / GONIOVISC) 2.5 % ophthalmic solution Place 1 drop into both eyes 3 (three) times daily as needed for dry eyes.    Marland Kitchen irbesartan (AVAPRO) 150 MG tablet Take 150 mg by mouth at bedtime.    . irbesartan-hydrochlorothiazide (AVALIDE) 150-12.5 MG tablet Take 1 tablet by mouth daily. (Patient taking differently: Take 1 tablet by mouth every evening. ) 30 tablet 3  . isosorbide mononitrate (IMDUR) 60 MG 24 hr tablet Take 60 mg by mouth every evening.     . loperamide (IMODIUM A-D) 2 MG tablet Take 2 mg by mouth 4 (four) times daily as needed for diarrhea or loose stools.    . Magnesium 400 MG TABS Take 400 mg by mouth every other day.     Marland Kitchen MAGNESIUM-OXIDE 400 (241.3 Mg) MG tablet Take 1 tablet by mouth daily.    . pseudoephedrine-acetaminophen (TYLENOL SINUS) 30-500 MG TABS tablet Take 1 tablet by  mouth every 4 (four) hours as needed (sinus congestion).    . traMADol (ULTRAM) 50 MG tablet Take 50 mg by mouth 2 (two) times daily as needed (pain).     . Vitamin D, Ergocalciferol, (DRISDOL) 50000 UNITS CAPS Take 50,000 Units by mouth Once a week. Fridays     No current facility-administered medications for this visit.    ROS:   General:  No weight loss, Fever, chills  HEENT: No recent headaches, no nasal bleeding, no visual changes, no sore throat  Neurologic: No dizziness, blackouts, seizures. No recent symptoms of stroke or mini- stroke. No recent episodes of slurred speech, or temporary blindness.  Cardiac: No recent episodes of chest pain/pressure, no shortness of breath at rest.  No shortness of breath with exertion.  Denies history of atrial fibrillation or irregular heartbeat  Vascular: No history of rest pain in feet.  No history of claudication.  No history of non-healing ulcer, No history of DVT   Pulmonary: No home oxygen, no productive cough, no hemoptysis,  No asthma or wheezing  Musculoskeletal:  [X]  Arthritis, [ ]  Low back pain,  [X]  Joint pain  Hematologic:No history of hypercoagulable state.  No history of easy bleeding.  No history of anemia  Gastrointestinal: No hematochezia or melena,  No gastroesophageal reflux, no trouble swallowing  Urinary: [X]  chronic Kidney disease, [ ]  on HD - [ ]  MWF or [ ]  TTHS, [ ]  Burning with urination, [ ]  Frequent  urination, [ ]  Difficulty urinating;   Skin: No rashes  Psychological: No history of anxiety,  No history of depression   Physical Examination  Vitals:   11/28/19 1523  BP: 114/67  Pulse: (!) 50  Resp: 20  Temp: 97.8 F (36.6 C)  SpO2: 98%  Weight: 157 lb (71.2 kg)  Height: 5\' 4"  (1.626 m)    Body mass index is 26.95 kg/m.  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   PLAN: Aortogram with mesenteric renal angiogram possible renal intervention scheduled for December 26, 2018.  We will do this through a left brachial approach to give [place] a better approach to the renal artery and also avoid his femorofemoral bypass.  Risk benefits possible complications of procedure details including but not limited to bleeding infection vessel injury were discussed with patient today.  He understands and agrees to proceed.  Ruta Hinds, MD [facility] Office: [phone] Pager: [phone]

## 2019-12-04 ENCOUNTER — Other Ambulatory Visit: Payer: Self-pay

## 2019-12-20 DIAGNOSIS — I714 Abdominal aortic aneurysm, without rupture, unspecified: Secondary | ICD-10-CM

## 2019-12-20 HISTORY — DX: Abdominal aortic aneurysm, without rupture, unspecified: I71.40

## 2019-12-25 ENCOUNTER — Inpatient Hospital Stay (HOSPITAL_COMMUNITY): Admission: RE | Admit: 2019-12-25 | Payer: Medicare Other | Source: Ambulatory Visit

## 2019-12-25 ENCOUNTER — Telehealth: Payer: Self-pay | Admitting: Vascular Surgery

## 2019-12-25 NOTE — Telephone Encounter (Signed)
Pt discussed with his urologist.  They are going to do a procedure to relieve ureteral obstruction prior to his angio to hopefully reduce contrast injury.  I will schedule pt appt and bmet and see me again to reschedule after his urology procedure.  Ruta Hinds, MD [facility] Office: [phone]

## 2019-12-27 ENCOUNTER — Encounter (HOSPITAL_COMMUNITY): Admission: RE | Payer: Self-pay | Source: Ambulatory Visit

## 2019-12-27 ENCOUNTER — Ambulatory Visit (HOSPITAL_COMMUNITY): Admission: RE | Admit: 2019-12-27 | Payer: Medicare PPO | Source: Ambulatory Visit | Admitting: Vascular Surgery

## 2019-12-27 SURGERY — ABDOMINAL AORTOGRAM W/LOWER EXTREMITY
Anesthesia: LOCAL | Laterality: Bilateral

## 2019-12-30 ENCOUNTER — Other Ambulatory Visit: Payer: Self-pay | Admitting: Urology

## 2019-12-31 ENCOUNTER — Encounter (HOSPITAL_BASED_OUTPATIENT_CLINIC_OR_DEPARTMENT_OTHER): Payer: Self-pay | Admitting: Urology

## 2019-12-31 ENCOUNTER — Other Ambulatory Visit (HOSPITAL_COMMUNITY)
Admission: RE | Admit: 2019-12-31 | Discharge: 2019-12-31 | Disposition: A | Discharge status: Home / Self Care | Payer: Medicare PPO | Source: Ambulatory Visit | Attending: Urology | Admitting: Urology

## 2019-12-31 ENCOUNTER — Other Ambulatory Visit: Payer: Self-pay

## 2019-12-31 DIAGNOSIS — Z20822 Contact with and (suspected) exposure to covid-19: Secondary | ICD-10-CM | POA: Diagnosis not present

## 2019-12-31 DIAGNOSIS — Z01812 Encounter for preprocedural laboratory examination: Secondary | ICD-10-CM | POA: Insufficient documentation

## 2019-12-31 NOTE — Progress Notes (Addendum)
Spoke w/ via phone for pre-op interview---[facility] Lab needs dos----   I stat 8           Lab results------ COVID test ------12-31-2019 Arrive at -------830 am 01-03-2020 NPO after ------midnight Medications to take morning of surgery -----albuterol inhaler prn/bring inhaler, albuterol nebulizer, diltiazem, esomeprazole Diabetic medication -----n/a Patient Special Instructions -----bring cpap mask and tubing Pre-Op special Istructions ----- Patient verbalized understanding of instructions that were given at this phone interview. Patient denies shortness of breath, chest pain, fever, cough a this phone interview.  Anesthesia: patient stated he stopped breathing with cholecystectomy many yrs ago, but no record could be found of this (patient thinks done [facility]), also woke up during cholecystectomy and colonscopy, has cardiac history Addendum: patient ok to proceed with surgery at [facility] per Janett Billow zanetto pa  PCP:dr richard aronson Cardiologist :dr Carma Leaven lawrence np 08-28-2019 chart/epic Chest x-ray :05-06-2019 chart/epic EKG :08-14-2019 chart/epic Echo :08-21-2019 chart/epic Stress test 12-31-2014 chart/epic Cardiac Cath : yrs ago lov vascular dr fields 11-28-2019 chart/epic Pulmonary dr Melvyn Novas Cassell Clement 11-06-2019 chart/epic Sleep Study/ CPAP : 4 yrs ago home sleep study, cpap autoset 5 to 20 Fasting Blood Sugar :      / Checks Blood Sugar -- times a day:   Blood Thinner/ Instructions /Last Dose: ASA / Instructions/ Last Dose :   Patient denies shortness of breath, chest pain, fever, and cough at this phone interview.

## 2020-01-02 LAB — NOVEL CORONAVIRUS, NAA (HOSP ORDER, SEND-OUT TO REF LAB; TAT 18-24 HRS): SARS-CoV-2, NAA: NOT DETECTED

## 2020-01-02 NOTE — Anesthesia Preprocedure Evaluation (Addendum)
Anesthesia Evaluation  Patient identified by MRN, date of birth, ID band Patient awake    Reviewed: Allergy & Precautions, NPO status , Patient's Chart, lab work & pertinent test results  History of Anesthesia Complications (+) PONV, AWARENESS UNDER ANESTHESIA and history of anesthetic complications  Airway Mallampati: I  TM Distance: >3 FB Neck ROM: Full    Dental  (+) Dental Advisory Given, Teeth Intact   Pulmonary asthma , sleep apnea and Continuous Positive Airway Pressure Ventilation , COPD,  COPD inhaler, Current Smoker and Patient abstained from smoking.,    Pulmonary exam normal        Cardiovascular hypertension, Pt. on medications + CAD, + Cardiac Stents and + Peripheral Vascular Disease  Normal cardiovascular exam   '20 TTE -  EF >65%. Mild concentric left ventricular hypertrophy. Left ventricular diastolic Doppler parameters are consistent with impaired relaxation.    Neuro/Psych negative neurological ROS  negative psych ROS   GI/Hepatic Neg liver ROS, GERD  Medicated and Controlled,  Endo/Other   Pre-DM   Renal/GU Renal disease Kidney stones Hydronephrosis      Musculoskeletal  (+) Arthritis ,   Abdominal   Peds  Hematology negative hematology ROS (+)   Anesthesia Other Findings Covid neg 1/12   Reproductive/Obstetrics                           Anesthesia Physical Anesthesia Plan  ASA: III  Anesthesia Plan: General   Post-op Pain Management:    Induction: Intravenous  PONV Risk Score and Plan: 4 or greater and Treatment may vary due to age or medical condition, Ondansetron and Dexamethasone  Airway Management Planned: LMA  Additional Equipment: None  Intra-op Plan:   Post-operative Plan: Extubation in OR  Informed Consent: I have reviewed the patients History and Physical, chart, labs and discussed the procedure including the risks, benefits and  alternatives for the proposed anesthesia with the patient or authorized representative who has indicated his/her understanding and acceptance.     Dental advisory given  Plan Discussed with: CRNA and Anesthesiologist  Anesthesia Plan Comments:        Anesthesia Quick Evaluation

## 2020-01-02 NOTE — H&P (Signed)
Office Visit Report     12/02/2019   --------------------------------------------------------------------------------   Dean Music. [name]  MRN: [id number]  DOB: [birth year], 74 year old Male  SSN: [id number]   PRIMARY CARE:  Richard A. Reynaldo Minium, MD  REFERRING:  Cleon Gustin, MD  PROVIDER:  Festus Aloe, M.D.  LOCATION:  [facility] [phone]     --------------------------------------------------------------------------------   CC: I have symptoms of an enlarged prostate.  HPI: Dean [name] is a 74 year-old male established patient who is here for symptoms of enlarged prostate.  He first noticed the symptoms approximately 06/19/2007. His symptoms have not gotten worse over the last year. He has been treated with Proscar. The patient has never had a surgical procedure for bladder outlet obstruction to his prostate.   Tried tamsulosin prn and alfuzosin in the past. Bothersome side effects with alpha blockers.   PSA 3.98 in 2006 and down to 1.24 in 2016 on finasteride.   He went to QOD finasteride in 2018. PSA was 1.8. BOO on cysto. PVR was 26 mL. AUASS = 27, predominantly weak stream, intermittent stream, urgency and frequency. Standing and walking are triggers. Good candidate for procedure. Tried vesicare and myrbetriq samples.   He has intermittent flow. PVR 38 mL. Continues finasteride QOD. He doesn't recall vesicare or myrbetriq helping and he had "nightmares". Urgency worse when he's been sitting and his back is sore an when he stands. No dysuria or gross hematuria. He started PTNS.   F/u bladder scan 206 ml. UA clear. PSA was 1.12. AUASS = 26 ml. PTNS has helped frequency and urgency, UUI "significantly". PSA now 3.99 on 09/12/2019 and PVR today is 000 ml. AUASS = 25. He went back to finasteride daily a couple of weeks ago.     CC: I have hydronephrosis.  HPI: His problem was diagnosed 11/12/2019. The problem is on the right side. He had the following  x-rays done: Renal Ultrasound.   He is not currently having flank pain, back pain, groin pain, nausea, vomiting, fever or chills.   Hillman had a BUN of 32 and creatinine of 2 on 11 03/08/2019. Because of that he underwent a renal ultrasound which showed moderate to severe right hydronephrosis, bilateral simple cysts and an enlarged prostate. The bladder did not appear distended. Today his postvoid is 0. Pt noted some red urine which cleared. MH today. No dysuria or flank pain. He was in process of getting pre-op for hip replacement.      ALLERGIES: Penicillins    MEDICATIONS: Finasteride 5 mg tablet TAKE 1 TABLET BY MOUTH DAILY  Nexium  Atorvastatin Calcium 10 MG Oral Tablet Oral  Baclofen  Diltiazem Er 240 mg capsule,extended-release 24hr degradable Oral  Dymista 137 mcg-50 mcg/spray aerosol, spray with pump Nasal  Irbesartan 1 PO Daily  Irbesartan-Hydrochlorothiazide 300 mg-12.5 mg tablet Oral  Isosorbide Mononitrate ER 60 MG Oral Tablet Extended Release 24 Hour Oral  Meloxicam  Proventil Hfa 90 mcg hfa aerosol with adapter Inhalation  Tramadol Hcl 50 mg tablet Oral  Vitamin D2 1,250 mcg (50,000 unit) capsule Oral     GU PSH: Cystoscopy - 2018 Cystoscopy Ureteroscopy - 2007       PSH Notes: Hernia Repair, Upper Gastrointestinal Endoscopy (Therapeutic), Tonsillectomy, Cystoscopy With Ureteroscopy, Appendectomy, Cholecystectomy   NON-GU PSH: Appendectomy - 2007 Cholecystectomy (open) - 2007 Hernia Repair - 2010 Neuroeltrd Stim Post Tibial - 01/31/2019, 01/03/2019, 12/14/2018, 12/07/2018, 11/30/2018, 11/23/2018, 11/13/2018, 11/09/2018, 11/02/2018, 10/26/2018, 10/19/2018, 10/12/2018, 10/05/2018, 09/28/2018 Remove Tonsils - 2008  GU PMH: BPH w/LUTS - 11/29/2018, - 09/03/2018, - 2018, - 2018, Benign localized prostatic hyperplasia with lower urinary tract symptoms (LUTS), - 2017, Benign prostatic hyperplasia with urinary obstruction, - 2014 ED due to arterial insufficiency -  11/29/2018, - 09/03/2018 Urinary Frequency - 11/29/2018 Urinary Urgency (Stable) - 09/03/2018, (Stable), - 2018, Urinary urgency, - 2014 Weak Urinary Stream (Stable) - 09/03/2018, (Stable), - 2018, Weak urinary stream, - 2017 Renal cyst - 2018, - 2018, Renal cyst, acquired, - 2014 Bladder Stone, Bladder calculus - 2014 BPH w/o LUTS, Benign prostatic hypertrophy without lower urinary tract symptoms - 2014 Elevated PSA, PSA,Elevated - 2014 Gross hematuria, Gross Hematuria - 2014 History of urolithiasis, Nephrolithiasis - 2014 Low back pain, Lower back pain - 2014 Obstructive and reflux uropathy, Unspec, Obstructive uropathy - 2014 Other microscopic hematuria, Microscopic Hematuria - 2014 Renal calculus, Kidney stone on left side - 2014 RUQ pain, Abdominal Pain In The Right Upper Belly (RUQ) - 2014      PMH Notes:  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   Tobacco Use Assessment Completed: Used Tobacco in last 30 days? Drinks 2 caffeinated drinks per day.     Notes: Current every day smoker, Marital History - Single, Occupation:, Death In The Family Mother, Tobacco Use, Caffeine Use   REVIEW OF SYSTEMS:    GU Review Male:   Patient denies frequent urination, hard to postpone urination, burning/ pain with urination, get up at night to urinate, leakage of urine, stream starts and stops, trouble starting your stream,  have to strain to urinate , erection problems, and penile pain.  Gastrointestinal (Upper):   Patient denies nausea, vomiting, and indigestion/ heartburn.  Gastrointestinal (Lower):   Patient denies diarrhea and constipation.  Constitutional:   Patient denies fever, night sweats, weight loss, and fatigue.  Skin:   Patient denies skin rash/ lesion and itching.  Eyes:   Patient denies blurred vision and double vision.  Ears/ Nose/ Throat:   Patient denies sore throat and sinus problems.  Hematologic/Lymphatic:   Patient denies swollen glands and easy bruising.  Cardiovascular:   Patient denies leg swelling and chest pains.  Respiratory:   Patient denies cough and shortness of breath.  Endocrine:   Patient denies excessive thirst.  Musculoskeletal:   Patient denies back pain and joint pain.  Neurological:   Patient denies headaches and dizziness.  Psychologic:   Patient denies depression and anxiety.   VITAL SIGNS:      12/02/2019 01:58 PM  Weight 157 lb / 71.21 kg  Height 63.5 in / 161.29 cm  BP 116/69 mmHg  Pulse 57 /min  Temperature 97.3 F / 36.2 C  BMI 27.4 kg/m   MULTI-SYSTEM PHYSICAL EXAMINATION:    Constitutional: Well-nourished. No physical deformities. Normally developed. Good grooming.  Neck: Neck symmetrical, not swollen. Normal tracheal position.  Respiratory: No labored breathing, no use of accessory muscles.   Cardiovascular: Normal temperature, normal extremity pulses, no swelling, no varicosities.  Skin: No paleness,  no jaundice, no cyanosis. No lesion, no ulcer, no rash.  Neurologic / Psychiatric: Oriented to time, oriented to place, oriented to person. No depression, no anxiety, no agitation.  Gastrointestinal: No mass, no tenderness, no rigidity, non obese abdomen.     PAST DATA REVIEWED:  Source Of History:  Patient   09/03/18 07/05/17 08/20/15 08/07/14 08/08/13 05/29/12 05/25/11 06/23/10  PSA  Total PSA 1.12 ng/mL 1.81 ng/mL 1.24  0.92  1.25  0.86  0.98  1.85      11/30/18  Hormones  Testosterone, Total 406.7 ng/dL    PROCEDURES:         PVR Ultrasound - 51798  Scanned Volume: 0 cc         Urinalysis w/Scope Dipstick Dipstick Cont'd Micro  Color: Yellow Bilirubin: Neg mg/dL WBC/hpf: 10 - 20/hpf  Appearance: Clear Ketones: Neg mg/dL RBC/hpf: 3 - 10/hpf  Specific Gravity: 1.015 Blood: 1+ ery/uL Bacteria: Few (10-25/hpf)  pH: 6.0 Protein: Neg mg/dL Cystals: Amorph Urates  Glucose: Neg mg/dL Urobilinogen: 0.2 mg/dL Casts: NS (Not Seen)    Nitrites: Neg Trichomonas: Not Present    Leukocyte Esterase: 3+ leu/uL Mucous: Not Present      Epithelial Cells: NS (Not Seen)      Yeast: NS (Not Seen)      Sperm: Not Present    ASSESSMENT:      ICD-10 Details  1 GU:   Oth hydronephrosis - N13.39   2   BPH w/LUTS - N40.1   3   Weak Urinary Stream - R39.12 Stable  4   Gross hematuria - R31.0    PLAN:            Medications New Meds: Silodosin 4 mg capsule 1 capsule PO Q HS   #30  11 Refill(s)    Stop Meds: Baclofen  Discontinue: 12/02/2019  - Reason: The medication cycle was completed.  Irbesartan-Hydrochlorothiazide 300 mg-12.5 mg tablet Oral  Start: 08/20/2015  Discontinue: 12/02/2019  - Reason: The medication cycle was completed.            Orders         Schedule X-Rays: 1 Week - C.T. Abdomen/Pelvis Without Contrast  Return Visit/Planned Activity: Next Available Appointment - Cystoscopy, Follow up MD          Document Letter(s):  Created for Patient: Clinical Summary         Notes:   BPH , luts - he will give silodosin a try - stream is slow. Check prostate size on CT . He is interested in [place].   hydronephrosis - check a CT scan. Also for hematuria.   cc: Dr. Reynaldo Minium         Next Appointment:      Next Appointment: 12/10/2019 01:15 PM    Appointment Type: CT Scan    Location: [facility] [phone]    Provider: CT CT    Reason for Visit: CT abdomen / pelvis without con/ Jarely Juncaj / hematuria ,  hydronephrosis      ** Signed by Festus Aloe, M.D. on 12/02/19 at 4:57 PM (EST)**     APPENDED NOTES:  I called and spoke to patient re: CT results and right hydro. I communicated with Dr. Oneida Alar (was going to do angio). Will plan cysto, bilateral RGP (right hydro, left cysts), poss bilateral stents, right URS. Given his rising Creatinine I would not recommend delay in treatment for Covid as the hydro is a new finding.   ** Signed  by Festus Aloe, M.D. on 12/27/19 at 12:45 PM (EST)**    The information contained in this medical record document is considered private and confidential patient information. This information can only be used for the medical diagnosis and/or medical services that are being provided by the patient's selected caregivers. This information can only be distributed outside of the patient's care if the patient agrees and signs waivers of authorization for this information to be sent to an outside source or route.

## 2020-01-03 ENCOUNTER — Ambulatory Visit (HOSPITAL_BASED_OUTPATIENT_CLINIC_OR_DEPARTMENT_OTHER)
Admission: RE | Admit: 2020-01-03 | Discharge: 2020-01-03 | Disposition: A | Discharge status: Home / Self Care | Payer: Medicare PPO | Attending: Urology | Admitting: Urology

## 2020-01-03 ENCOUNTER — Other Ambulatory Visit: Payer: Self-pay

## 2020-01-03 ENCOUNTER — Encounter (HOSPITAL_BASED_OUTPATIENT_CLINIC_OR_DEPARTMENT_OTHER)
Admission: RE | Disposition: A | Discharge status: Home / Self Care | Payer: Self-pay | Source: Home / Self Care | Attending: Urology

## 2020-01-03 ENCOUNTER — Encounter (HOSPITAL_BASED_OUTPATIENT_CLINIC_OR_DEPARTMENT_OTHER): Payer: Self-pay | Admitting: Urology

## 2020-01-03 ENCOUNTER — Ambulatory Visit (HOSPITAL_BASED_OUTPATIENT_CLINIC_OR_DEPARTMENT_OTHER): Payer: Medicare PPO | Admitting: Physician Assistant

## 2020-01-03 DIAGNOSIS — N281 Cyst of kidney, acquired: Secondary | ICD-10-CM | POA: Diagnosis not present

## 2020-01-03 DIAGNOSIS — F17200 Nicotine dependence, unspecified, uncomplicated: Secondary | ICD-10-CM | POA: Insufficient documentation

## 2020-01-03 DIAGNOSIS — Z791 Long term (current) use of non-steroidal anti-inflammatories (NSAID): Secondary | ICD-10-CM | POA: Diagnosis not present

## 2020-01-03 DIAGNOSIS — I2510 Atherosclerotic heart disease of native coronary artery without angina pectoris: Secondary | ICD-10-CM | POA: Diagnosis not present

## 2020-01-03 DIAGNOSIS — N135 Crossing vessel and stricture of ureter without hydronephrosis: Secondary | ICD-10-CM

## 2020-01-03 DIAGNOSIS — N189 Chronic kidney disease, unspecified: Secondary | ICD-10-CM | POA: Diagnosis not present

## 2020-01-03 DIAGNOSIS — N131 Hydronephrosis with ureteral stricture, not elsewhere classified: Secondary | ICD-10-CM | POA: Diagnosis not present

## 2020-01-03 DIAGNOSIS — Z7951 Long term (current) use of inhaled steroids: Secondary | ICD-10-CM | POA: Diagnosis not present

## 2020-01-03 DIAGNOSIS — J449 Chronic obstructive pulmonary disease, unspecified: Secondary | ICD-10-CM | POA: Insufficient documentation

## 2020-01-03 DIAGNOSIS — N1330 Unspecified hydronephrosis: Secondary | ICD-10-CM | POA: Diagnosis present

## 2020-01-03 DIAGNOSIS — R3912 Poor urinary stream: Secondary | ICD-10-CM | POA: Diagnosis not present

## 2020-01-03 DIAGNOSIS — I739 Peripheral vascular disease, unspecified: Secondary | ICD-10-CM | POA: Diagnosis not present

## 2020-01-03 DIAGNOSIS — I129 Hypertensive chronic kidney disease with stage 1 through stage 4 chronic kidney disease, or unspecified chronic kidney disease: Secondary | ICD-10-CM | POA: Insufficient documentation

## 2020-01-03 DIAGNOSIS — D414 Neoplasm of uncertain behavior of bladder: Secondary | ICD-10-CM | POA: Insufficient documentation

## 2020-01-03 DIAGNOSIS — Z79899 Other long term (current) drug therapy: Secondary | ICD-10-CM | POA: Insufficient documentation

## 2020-01-03 DIAGNOSIS — K219 Gastro-esophageal reflux disease without esophagitis: Secondary | ICD-10-CM | POA: Insufficient documentation

## 2020-01-03 DIAGNOSIS — R310 Gross hematuria: Secondary | ICD-10-CM | POA: Insufficient documentation

## 2020-01-03 DIAGNOSIS — Z955 Presence of coronary angioplasty implant and graft: Secondary | ICD-10-CM | POA: Diagnosis not present

## 2020-01-03 DIAGNOSIS — N401 Enlarged prostate with lower urinary tract symptoms: Secondary | ICD-10-CM | POA: Diagnosis not present

## 2020-01-03 DIAGNOSIS — G4730 Sleep apnea, unspecified: Secondary | ICD-10-CM | POA: Insufficient documentation

## 2020-01-03 HISTORY — PX: CYSTOSCOPY WITH URETEROSCOPY AND STENT PLACEMENT: SHX6377

## 2020-01-03 LAB — POCT I-STAT, CHEM 8
BUN: 24 mg/dL — ABNORMAL HIGH (ref 8–23)
Calcium, Ion: 1.33 mmol/L (ref 1.15–1.40)
Chloride: 96 mmol/L — ABNORMAL LOW (ref 98–111)
Creatinine, Ser: 1.8 mg/dL — ABNORMAL HIGH (ref 0.61–1.24)
Glucose, Bld: 78 mg/dL (ref 70–99)
HCT: 42 % (ref 39.0–52.0)
Hemoglobin: 14.3 g/dL (ref 13.0–17.0)
Potassium: 4.1 mmol/L (ref 3.5–5.1)
Sodium: 137 mmol/L (ref 135–145)
TCO2: 31 mmol/L (ref 22–32)

## 2020-01-03 SURGERY — CYSTOURETEROSCOPY, WITH STENT INSERTION
Anesthesia: General | Site: Pelvis | Laterality: Bilateral

## 2020-01-03 MED ORDER — DEXAMETHASONE SODIUM PHOSPHATE 10 MG/ML IJ SOLN
INTRAMUSCULAR | Status: AC
  Filled 2020-01-03: qty 1

## 2020-01-03 MED ORDER — IPRATROPIUM-ALBUTEROL 0.5-2.5 (3) MG/3ML IN SOLN
RESPIRATORY_TRACT | Status: AC
  Filled 2020-01-03: qty 3

## 2020-01-03 MED ORDER — FENTANYL CITRATE (PF) 100 MCG/2ML IJ SOLN
INTRAMUSCULAR | Status: AC
  Filled 2020-01-03: qty 2

## 2020-01-03 MED ORDER — LIDOCAINE 2% (20 MG/ML) 5 ML SYRINGE
INTRAMUSCULAR | Status: AC
  Filled 2020-01-03: qty 5

## 2020-01-03 MED ORDER — PHENYLEPHRINE 40 MCG/ML (10ML) SYRINGE FOR IV PUSH (FOR BLOOD PRESSURE SUPPORT)
PREFILLED_SYRINGE | INTRAVENOUS | Status: AC
  Filled 2020-01-03: qty 10

## 2020-01-03 MED ORDER — OXYCODONE HCL 5 MG PO TABS
5.0000 mg | ORAL_TABLET | Freq: Once | ORAL | Status: DC | PRN
  Filled 2020-01-03: qty 1

## 2020-01-03 MED ORDER — CIPROFLOXACIN IN D5W 400 MG/200ML IV SOLN
400.0000 mg | Freq: Once | INTRAVENOUS | Status: AC
  Administered 2020-01-03 (×2): 400 mg via INTRAVENOUS
  Filled 2020-01-03: qty 200

## 2020-01-03 MED ORDER — DEXAMETHASONE SODIUM PHOSPHATE 4 MG/ML IJ SOLN
INTRAMUSCULAR | Status: DC | PRN
  Administered 2020-01-03: 4 mg via INTRAVENOUS

## 2020-01-03 MED ORDER — SODIUM CHLORIDE 0.9 % IV SOLN
INTRAVENOUS | Status: DC
  Filled 2020-01-03: qty 1000

## 2020-01-03 MED ORDER — EPHEDRINE 5 MG/ML INJ
INTRAVENOUS | Status: AC
  Filled 2020-01-03: qty 10

## 2020-01-03 MED ORDER — LIDOCAINE HCL (CARDIAC) PF 100 MG/5ML IV SOSY
PREFILLED_SYRINGE | INTRAVENOUS | Status: DC | PRN
  Administered 2020-01-03: 80 mg via INTRAVENOUS

## 2020-01-03 MED ORDER — OXYCODONE HCL 5 MG/5ML PO SOLN
5.0000 mg | Freq: Once | ORAL | Status: DC | PRN
  Filled 2020-01-03: qty 5

## 2020-01-03 MED ORDER — FENTANYL CITRATE (PF) 100 MCG/2ML IJ SOLN
25.0000 ug | INTRAMUSCULAR | Status: DC | PRN
  Filled 2020-01-03: qty 1

## 2020-01-03 MED ORDER — STERILE WATER FOR IRRIGATION IR SOLN
Status: DC | PRN
  Administered 2020-01-03: 3000 mL

## 2020-01-03 MED ORDER — ONDANSETRON HCL 4 MG/2ML IJ SOLN
4.0000 mg | Freq: Once | INTRAMUSCULAR | Status: DC | PRN
  Filled 2020-01-03: qty 2

## 2020-01-03 MED ORDER — IOHEXOL 300 MG/ML  SOLN
INTRAMUSCULAR | Status: DC | PRN
  Administered 2020-01-03: 50 mL via URETHRAL

## 2020-01-03 MED ORDER — ONDANSETRON HCL 4 MG/2ML IJ SOLN
INTRAMUSCULAR | Status: DC | PRN
  Administered 2020-01-03: 4 mg via INTRAVENOUS

## 2020-01-03 MED ORDER — LACTATED RINGERS IV SOLN
INTRAVENOUS | Status: DC
  Filled 2020-01-03: qty 1000

## 2020-01-03 MED ORDER — EPHEDRINE SULFATE 50 MG/ML IJ SOLN
INTRAMUSCULAR | Status: DC | PRN
  Administered 2020-01-03: 10 mg via INTRAVENOUS

## 2020-01-03 MED ORDER — IPRATROPIUM-ALBUTEROL 0.5-2.5 (3) MG/3ML IN SOLN
3.0000 mL | Freq: Once | RESPIRATORY_TRACT | Status: AC
  Administered 2020-01-03: 3 mL via RESPIRATORY_TRACT
  Filled 2020-01-03: qty 3

## 2020-01-03 MED ORDER — PROPOFOL 10 MG/ML IV BOLUS
INTRAVENOUS | Status: AC
  Filled 2020-01-03: qty 20

## 2020-01-03 MED ORDER — ONDANSETRON HCL 4 MG/2ML IJ SOLN
INTRAMUSCULAR | Status: AC
  Filled 2020-01-03: qty 2

## 2020-01-03 MED ORDER — FENTANYL CITRATE (PF) 100 MCG/2ML IJ SOLN
INTRAMUSCULAR | Status: DC | PRN
  Administered 2020-01-03: 50 ug via INTRAVENOUS

## 2020-01-03 MED ORDER — PHENYLEPHRINE 40 MCG/ML (10ML) SYRINGE FOR IV PUSH (FOR BLOOD PRESSURE SUPPORT)
PREFILLED_SYRINGE | INTRAVENOUS | Status: DC | PRN
  Administered 2020-01-03: 80 ug via INTRAVENOUS

## 2020-01-03 MED ORDER — CIPROFLOXACIN IN D5W 400 MG/200ML IV SOLN
INTRAVENOUS | Status: AC
  Filled 2020-01-03: qty 200

## 2020-01-03 MED ORDER — PROPOFOL 10 MG/ML IV BOLUS
INTRAVENOUS | Status: DC | PRN
  Administered 2020-01-03: 130 mg via INTRAVENOUS

## 2020-01-03 SURGICAL SUPPLY — 24 items
BAG DRAIN URO-CYSTO SKYTR STRL (DRAIN) ×3 IMPLANT
BAG DRN UROCATH (DRAIN) ×1
CATH URET 5FR 28IN CONE TIP (BALLOONS)
CATH URET 5FR 28IN OPEN ENDED (CATHETERS) ×2 IMPLANT
CATH URET 5FR 70CM CONE TIP (BALLOONS) IMPLANT
CATH URET DUAL LUMEN 6-10FR 50 (CATHETERS) ×2 IMPLANT
CLOTH BEACON ORANGE TIMEOUT ST (SAFETY) ×3 IMPLANT
CONT SPEC 4OZ CLIKSEAL STRL BL (MISCELLANEOUS) ×2 IMPLANT
GLOVE BIO SURGEON STRL SZ7.5 (GLOVE) ×3 IMPLANT
GLOVE BIO SURGEON STRL SZ8 (GLOVE) IMPLANT
GOWN STRL REUS W/TWL LRG LVL3 (GOWN DISPOSABLE) ×3 IMPLANT
GUIDEWIRE ANG ZIPWIRE 038X150 (WIRE) ×2 IMPLANT
GUIDEWIRE STR DUAL SENSOR (WIRE) ×3 IMPLANT
IV NS IRRIG 3000ML ARTHROMATIC (IV SOLUTION) ×4 IMPLANT
KIT TURNOVER CYSTO (KITS) ×3 IMPLANT
MANIFOLD NEPTUNE II (INSTRUMENTS) ×3 IMPLANT
NS IRRIG 500ML POUR BTL (IV SOLUTION) ×3 IMPLANT
PACK CYSTO (CUSTOM PROCEDURE TRAY) ×3 IMPLANT
STENT URET 6FRX26 CONTOUR (STENTS) ×2 IMPLANT
SYR 10ML LL (SYRINGE) ×2 IMPLANT
TUBE CONNECTING 12'X1/4 (SUCTIONS) ×1
TUBE CONNECTING 12X1/4 (SUCTIONS) ×2 IMPLANT
TUBING UROLOGY SET (TUBING) ×3 IMPLANT
WATER STERILE IRR 3000ML UROMA (IV SOLUTION) ×2 IMPLANT

## 2020-01-03 NOTE — Interval H&P Note (Signed)
History and Physical Interval Note:  01/03/2020 9:01 AM  Dean [name]  has presented today for surgery, with the diagnosis of KIDNEY DISEASE, RIGHT HYDRONEPHROSIS, LEFT CYSTS.  The various methods of treatment have been discussed with the patient and family. After consideration of risks, benefits and other options for treatment, the patient has consented to  Procedure(s) with comments: CYSTOSCOPY WITH RETROGRADE/ URETEROSCOPY AND POSSIBLE STENT PLACEMENT (Bilateral) - ONLY NEEDS 30 MIN as a surgical intervention.  The patient's history has been reviewed, patient examined, no change in status, stable for surgery.  He has had no fevers or chills.  No dysuria or gross hematuria.  His stream is better after getting on silodosin.  He does have some urgency and urge incontinence. No flank pain.  I have reviewed the patient's chart and labs.  Questions were answered to the patient's satisfaction.   Festus Aloe

## 2020-01-03 NOTE — Discharge Instructions (Signed)
[facility] [phone] Post Ureteroscopy With Stent Instructions  This sheet gives you information about how to care for yourself after your procedure. Your health care provider may also give you more specific instructions. If you have problems or questions, contact your health care provider. What can I expect after the procedure? After the procedure, it is common to have:  Nausea.  Mild pain when you urinate. You may feel this pain in your lower back or lower abdomen. The pain should stop within a few minutes after you urinate. This may last for up to 1 week.  A small amount of blood in your urine for several days. Follow these instructions at home: Medicines  Take over-the-counter and prescription medicines only as told by your health care provider.  If you were prescribed an antibiotic medicine, take it as told by your health care provider. Do not stop taking the antibiotic even if you start to feel better.  Do not drive for 24 hours if you were given a sedative during your procedure.  Ask your health care provider if the medicine prescribed to you requires you to avoid driving or using heavy machinery. Activity  Rest as told by your health care provider.  Avoid sitting for a long time without moving. Get up to take short walks every 1-2 hours. This is important to improve blood flow and breathing. Ask for help if you feel weak or unsteady.  Return to your normal activities as told by your health care provider. Ask your health care provider what activities are safe for you. General instructions   Watch for any blood in your urine. Call your health care provider if the amount of blood in your urine increases.  If you have a catheter: ? Follow instructions from your health care provider about taking care of your catheter and collection bag. ? Do not take baths, swim, or use a hot tub until your health care provider approves. Ask your health care provider if  you may take showers. You may only be allowed to take sponge baths.  Drink enough fluid to keep your urine pale yellow.  Do not use any products that contain nicotine or tobacco, such as cigarettes, e-cigarettes, and chewing tobacco. These can delay healing after surgery. If you need help quitting, ask your health care provider.  Keep all follow-up visits as told by your health care provider. This is important. Contact a health care provider if:  You have pain that gets worse or does not get better with medicine, especially pain when you urinate.  You have difficulty urinating.  You feel nauseous or you vomit repeatedly during a period of more than 2 days after the procedure. Get help right away if:  Your urine is dark red or has blood clots in it.  You are leaking urine (have incontinence).  The end of the stent comes out of your urethra.  You cannot urinate.  You have sudden, sharp, or severe pain in your abdomen or lower back.  You have a fever.  You have swelling or pain in your legs.  You have difficulty breathing. Summary  After the procedure, it is common to have mild pain when you urinate that goes away within a few minutes after you urinate. This may last for up to 1 week.  Watch for any blood in your urine. Call your health care provider if the amount of blood in your urine increases.  Take over-the-counter and prescription medicines only as told by your  health care provider.  Drink enough fluid to keep your urine pale yellow. This information is not intended to replace advice given to you by your health care provider. Make sure you discuss any questions you have with your health care provider. Document Revised: 09/11/2018 Document Reviewed: 09/12/2018 Elsevier Patient Education  [place].   Definitions:  Ureter: The duct that transports urine from the kidney to the bladder. Stent:   A plastic hollow tube that is placed into the ureter, from the  kidney to the                 bladder to prevent the ureter from swelling shut.  MORE GENERAL INSTRUCTIONS:  Despite the fact that no skin incisions were used, the area around the ureter and bladder is raw and irritated. The stent is a foreign body which will further irritate the bladder wall. This irritation is manifested by increased frequency of urination, both day and night, and by an increase in the urge to urinate. In some, the urge to urinate is present almost always. Sometimes the urge is strong enough that you may not be able to stop yourself from urinating. The only real cure is to remove the stent and then give time for the bladder wall to heal which can't be done until the danger of the ureter swelling shut has passed, which varies.  You may see some blood in your urine while the stent is in place and a few days afterwards. Do not be alarmed, even if the urine was clear for a while. Get off your feet and drink lots of fluids until clearing occurs. If you start to pass clots or don't improve, call us.  DIET: You may return to your normal diet immediately. Because of the raw surface of your bladder, alcohol, spicy foods, acid type foods and drinks with caffeine may cause irritation or frequency and should be used in moderation. To keep your urine flowing freely and to avoid constipation, drink plenty of fluids during the day ( 8-10 glasses ). Tip: Avoid cranberry juice because it is very acidic.  ACTIVITY: Your physical activity doesn't need to be restricted. However, if you are very active, you may see some blood in your urine. We suggest that you reduce your activity under these circumstances until the bleeding has stopped.  BOWELS: It is important to keep your bowels regular during the postoperative period. Straining with bowel movements can cause bleeding. A bowel movement every other day is reasonable. Use a mild laxative if needed, such as Milk of Magnesia 2-3 tablespoons, or 2  Dulcolax tablets. Call if you continue to have problems. If you have been taking narcotics for pain, before, during or after your surgery, you may be constipated. Take a laxative if necessary.   MEDICATION: You should resume your pre-surgery medications unless told not to. In addition you will often be given an antibiotic to prevent infection. These should be taken as prescribed until the bottles are finished unless you are having an unusual reaction to one of the drugs.  PROBLEMS YOU SHOULD REPORT TO [place]:  Fevers over 100.5 Fahrenheit.  Heavy bleeding, or clots ( See above notes about blood in urine ).  Inability to urinate.  Drug reactions ( hives, rash, nausea, vomiting, diarrhea ).  Severe burning or pain with urination that is not improving.  FOLLOW-UP: You will need a follow-up appointment to monitor your progress. Call for this appointment at the number listed above. Usually the first  appointment will be about three to fourteen days after your surgery.   Post Anesthesia Home Care Instructions  Activity: Get plenty of rest for the remainder of the day. A responsible individual must stay with you for 24 hours following the procedure.  For the next 24 hours, DO NOT: -Drive a car -Paediatric nurse -Drink alcoholic beverages -Take any medication unless instructed by your physician -Make any legal decisions or sign important papers.  Meals: Start with liquid foods such as gelatin or soup. Progress to regular foods as tolerated. Avoid greasy, spicy, heavy foods. If nausea and/or vomiting occur, drink only clear liquids until the nausea and/or vomiting subsides. Call your physician if vomiting continues.  Special Instructions/Symptoms: Your throat may feel dry or sore from the anesthesia or the breathing tube placed in your throat during surgery. If this causes discomfort, gargle with warm salt water. The discomfort should disappear within 24 hours.

## 2020-01-03 NOTE — Transfer of Care (Signed)
Immediate Anesthesia Transfer of Care Note  Patient: Dean [name]  Procedure(s) Performed: CYSTOSCOPY WITH RETROGRADE/ URETEROSCOPY AND RIGHT STENT PLACEMENT BLADDER BIOPSY (Bilateral Pelvis)  Patient Location: PACU  Anesthesia Type:General  Level of Consciousness: drowsy  Airway & Oxygen Therapy: Patient Spontanous Breathing and Patient connected to nasal cannula oxygen  Post-op Assessment: Report given to RN  Post vital signs: Reviewed and stable  Last Vitals:  Vitals Value Taken Time  BP 108/59 01/03/20 1016  Temp    Pulse 55 01/03/20 1019  Resp 14 01/03/20 1019  SpO2 99 % 01/03/20 1019  Vitals shown include unvalidated device data.  Last Pain:  Vitals:   01/03/20 0803  TempSrc: Oral  PainSc: 0-No pain      Patients Stated Pain Goal: 4 (81/44/81 8563)  Complications: No apparent anesthesia complications

## 2020-01-03 NOTE — Op Note (Signed)
Preoperative diagnosis: Chronic renal insufficiency, right hydronephrosis, renal cyst  Postoperative diagnosis: Bladder neoplasm, right UPJ obstruction, renal cysts, chronic renal insufficiency  Procedure: Cystoscopy, bilateral retrograde pyelogram, right ureteroscopy, bladder biopsy with fulguration 0.5 to 2 cm, right ureteral stent placement  Surgeon: Junious Silk  Anesthesia: General  Indication for procedure: [name] is a 74 year old male.  He was undergoing evaluation for right hip replacement when creatinine was noted to be elevated to 1.9 up from baseline of about 1.3.  Renal ultrasound was done which revealed right hydronephrosis and bilateral renal cysts.  The renal pelvis was dilated to the UPJ.  A follow-up CT scan of the abdomen and pelvis was obtained which again showed dilation of the right collecting system to the UPJ but no obvious mass.  Bilateral renal cysts.  Possible left hydro versus cyst especially at the upper pole.  Patient also had microscopic hematuria on UA in the office and has had some weak stream, urgency and urge incontinence.  Findings: On exam under anesthesia the penis was circumcised and without mass or lesion, the testicles were descended bilaterally and palpably normal.  On digital rectal exam the prostate was about 40 g and smooth without hard area or nodule  On cystoscopy the urethra was unremarkable.  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  He be a good candidate for UroLift or resume minimally invasive treatments for flow.  In the bladder there was a papillary tumor about 1 cm right posterior wall.  This appeared superficial.  There were no other bladder tumors.  Left retrograde pyelogram-this outlined a single ureter single collecting system unit.  There was no filling defect, stricture or dilation.  The upper pole infundibulum was somewhat pushed posteriorly I believe by one of the renal cyst on  the CT.  This was a smooth curve to the infundibulum without filling defect.  Right retrograde pyelogram-this outlined a single ureter with contrast going to the UPJ and no contrast progressing into the collecting system.  There was high-grade obstruction at the right UPJ.  Once I got the catheter into the renal pelvis this outlined a severely dilated collecting system and renal pelvis without filling defect.  On right ureteroscopy the ureter appeared normal.  The collecting system was somewhat hard to examine because of a lot of debris in the renal pelvis but I did not see any obvious tumors.  The UPJ appeared to be kinked off similar to a high insertion or a crossing vessel but appeared benign but obstructed.  I could see the wire going through the UPJ but not beyond it.  Description of procedure: After consent was obtained the patient was brought to the operating room.  After adequate anesthesia he was placed in lithotomy position and prepped and draped in the usual sterile fashion.  The lithotomy was Tight because of some right hip pain.  There was not a lot of working room between his legs.  The cystoscope was passed per urethra and the bladder carefully inspected with a 30 degree and 70 degree lens.  A 5 French open-ended catheter was used to cannulate the left ureteral orifice and left retrograde injection of contrast was performed.  The 5 French catheter was advanced into the right ureteral orifice and right retrograde injection of contrast was performed.  I could not get contrast past the right UPJ and I advanced the catheter up into the proximal ureter and again injected contrast.  Again no contrast  past the right UPJ.  Therefore I took the guidewire and advanced it I could see that it went laterally and then came back medially and then popped the UPJ straight.  This could be kinked off from a high insertion or a crossing vessel.  I then advanced the 5 French catheter into the renal pelvis and  withdrew the wire.  There was a good hydronephrotic drip and this urine was collected for cytology.  It was grossly clear.  I then repassed the sensor wire into the renal pelvis and remove the 5 French catheter.    A dual-lumen exchange catheter was used to advance a Glidewire.  I then advanced the single channel digital ureteroscope over the Glidewire into the renal pelvis.  The wire was removed and the renal pelvis inspected it appeared normal but severely dilated and will likely need another inspection after the stent has been in and it has been drained.  I backed the scope out to the UPJ and it popped out into the right proximal ureter.  The UPJ appeared kinked off but no mass was seen at this area.  The ureteroscope was backed out on the cystoscope and replaced.  The cystoscope was used to biopsy the right posterior bladder tumor with the cold cup biopsy forceps.  The biopsy site was then fulgurated with excellent hemostasis.  The tumor appeared superficial.  There sensor wire was then backloaded on the cystoscope and a 6 x 26 cm stent advanced.  The wire was removed with a good coil seen in the upper pole collecting system and a good coil in the bladder.  The bladder was drained and the scope removed.  I did an exam under anesthesia.  The patient was awakened and taken to the recovery room in stable condition.  Complications: None  Blood loss: Minimal  Specimens to pathology: #1 right renal pelvis urine for cytology #2 right posterior bladder tumor  Drains: 6 x 26 cm right ureteral stent  Disposition: Patient stable to PACU

## 2020-01-03 NOTE — Anesthesia Procedure Notes (Addendum)
Procedure Name: LMA Insertion Date/Time: 01/03/2020 9:18 AM Performed by: Bonney Aid, CRNA Pre-anesthesia Checklist: Patient identified, Emergency Drugs available, Suction available and Patient being monitored Patient Re-evaluated:Patient Re-evaluated prior to induction Oxygen Delivery Method: Circle system utilized Preoxygenation: Pre-oxygenation with 100% oxygen Induction Type: IV induction Ventilation: Mask ventilation without difficulty LMA: LMA inserted LMA Size: 4.0 Number of attempts: 2 Airway Equipment and Method: Bite block Placement Confirmation: positive ETCO2 Tube secured with: Tape Dental Injury: Teeth and Oropharynx as per pre-operative assessment  Comments: Pre-op pt with productive cough.  Room air sat 89%

## 2020-01-03 NOTE — Anesthesia Postprocedure Evaluation (Signed)
Anesthesia Post Note  Patient: Dean [name]  Procedure(s) Performed: CYSTOSCOPY WITH RETROGRADE/ URETEROSCOPY AND RIGHT STENT PLACEMENT BLADDER BIOPSY (Bilateral Pelvis)     Patient location during evaluation: PACU Anesthesia Type: General Level of consciousness: awake and alert Pain management: pain level controlled Vital Signs Assessment: post-procedure vital signs reviewed and stable Respiratory status: spontaneous breathing, nonlabored ventilation and respiratory function stable Cardiovascular status: blood pressure returned to baseline and stable Postop Assessment: no apparent nausea or vomiting Anesthetic complications: no    Last Vitals:  Vitals:   01/03/20 1049 01/03/20 1100  BP:  (!) 111/55  Pulse: (!) 47 (!) 47  Resp: 13 10  Temp:    SpO2: 91% 95%    Last Pain:  Vitals:   01/03/20 1045  TempSrc:   PainSc: 0-No pain                 Audry Pili

## 2020-01-03 NOTE — Interval H&P Note (Signed)
History and Physical Interval Note:  01/03/2020 9:04 AM  I wanted to add we discussed he would probably need a staged procedure so that we could get a good look at the upper tracts especially on the right once we exchange or remove the stent.   Festus Aloe

## 2020-01-06 LAB — SURGICAL PATHOLOGY

## 2020-01-06 LAB — CYTOLOGY - NON PAP

## 2020-01-07 ENCOUNTER — Other Ambulatory Visit: Payer: Self-pay | Admitting: Internal Medicine

## 2020-01-12 NOTE — Progress Notes (Signed)
Virtual Visit via Telephone Note   This visit type was conducted due to national recommendations for restrictions regarding the COVID-19 Pandemic (e.g. social distancing) in an effort to limit this patient's exposure and mitigate transmission in our community.  Due to his co-morbid illnesses, this patient is at least at moderate risk for complications without adequate follow up.  This format is felt to be most appropriate for this patient at this time.  The patient did not have access to video technology/had technical difficulties with video requiring transitioning to audio format only (telephone).  All issues noted in this document were discussed and addressed.  No physical exam could be performed with this format.  Please refer to the patient's chart for his  consent to telehealth for [facility].   Date:  01/13/2020   ID:  Dean [name], DOB 1946/05/12, MRN [id number]  Patient Location: Home Provider Location: Home  PCP:  [name], MD  Cardiologist:  Pixie Casino, MD  Electrophysiologist:  None   Evaluation Performed:  Follow-Up Visit  Chief Complaint:  Follow Up  History of Present Illness:    Dean [name] is a 74 y.o. male for ongoing assessment and management of hypertension, history of PAD status post femoral-femoral bypass for occluded distal aorta, dyslipidemia, ongoing tobacco abuse.  Most recent stress test was a Myoview on 12/31/2014 and was found to be negative for ischemia but did show a small fixed defect which was likely artifact.  The patient is also being followed by pulmonary and has been diagnosed with a severe COPD.  Mr. [name] was last seen in the office on 08/28/2019, at that time echocardiogram was reviewed which revealed normal LV systolic function with mild concentric LVH, and diastolic dysfunction.  There were no valvular abnormalities noted.  The patient did have some mild calcification of the aortic valve.  He was seen for preoperative  evaluation for right hip replacement.  He was cleared from cardiac standpoint to proceed.  Since that time, he has been seen by Dr. Ruta Hinds for renal artery stenosis.  An aortogram with mesenteric renal angiographic evaluation and possible renal intervention was scheduled for December 26, 2018.  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  Unfortunately, the patient did have urethral obstruction and was to follow-up with urologist first to reduce contrast injury.  A cystoscopy was performed on 01/03/2020 with placement of right stent along with bladder biopsy, by Dr. Fransisco Beau on 01/03/2020.  Mr. [name] is without cardiac complaint today. He is medically compliant. He continues to be followed by neurology and nephrology. Has not had hip replacement yet. He unfortunately continues to smoke.   The patient does not have symptoms concerning for COVID-19 infection (fever, chills, cough, or new shortness of breath). He has registered but is waiting on his vaccine.    Past Medical History:  Diagnosis Date  . Allergy    pollen  . Arthritis   . Asthma   . Atrial fibrillation ([place])    no documentation in epic/ pt states has never been told has A Fib  . CAD (coronary artery disease)    s/p angioplasty and stenting x3 1994, pt states has this done  . Cataract    small, mild  . Chronic kidney disease    kidney cyst  . Complication of anesthesia  woke up during gall bladder sx yrs ago and with colonscopy  . Complication of anesthesia    pt thinks stopped breathing after gallbladder sx years ago, no record found  . COPD (chronic obstructive pulmonary disease) ([place])   . GERD (gastroesophageal reflux disease)    hx of  . History of kidney stones   . Hyperlipidemia   . Hypertension   . Neuromuscular disorder ([place])    nerve issues with  back   . Peripheral vascular disease ([place])   . PONV (postoperative nausea and vomiting)    none recent  . Pre-diabetes    none in last 6 months due to weight loss of 40 pounds  . PVC's (premature ventricular contractions)   . Sleep apnea    wears cpap auto set 5 to 20   Past Surgical History:  Procedure Laterality Date  . APPENDECTOMY    . CHOLECYSTECTOMY  1990s   Gall Bladder  . COLONOSCOPY    . CORONARY ANGIOPLASTY WITH STENT PLACEMENT  1994  . CYSTOSCOPY WITH URETEROSCOPY AND STENT PLACEMENT Bilateral 01/03/2020   Procedure: CYSTOSCOPY WITH RETROGRADE/ URETEROSCOPY AND RIGHT STENT PLACEMENT BLADDER BIOPSY;  Surgeon: Festus Aloe, MD;  Location: [facility];  Service: Urology;  Laterality: Bilateral;  ONLY NEEDS 30 MIN  . EYE SURGERY Bilateral 2019   cataracts  . lumbar disectomy  2005   L3 - 4 - 5  . POLYPECTOMY    . PR VEIN BYPASS GRAFT,AORTO-FEM-POP  08/31/11   Right to Left Fem-Fem  . TONSILLECTOMY    . UMBILICAL HERNIA REPAIR       Current Meds  Medication Sig  . albuterol (PROVENTIL HFA) 108 (90 Base) MCG/ACT inhaler Inhale 1 puff into the lungs 2 (two) times daily.  Marland Kitchen albuterol (PROVENTIL) (2.5 MG/3ML) 0.083% nebulizer solution USE 1 VIAL VIA NEBULIZER EVERY 6 HOURS AS NEEDED FOR WHEEZING OR SHORTNESS OF BREATH (Patient taking differently: Take 2.5 mg by nebulization every 6 (six) hours as needed for wheezing. )  . antiseptic oral rinse (BIOTENE) LIQD 15 mLs by Mouth Rinse route as needed for dry mouth.  Marland Kitchen aspirin 325 MG tablet Take 325 mg by mouth daily with supper.   Marland Kitchen atorvastatin (LIPITOR) 10 MG tablet Take 10 mg by mouth every evening.   . Azelastine-Fluticasone 137-50 MCG/ACT SUSP PLACE 1-2 SPRAYS INTO EACH NOSTRIL AT BEDTIME  . diltiazem (TIAZAC) 240 MG 24 hr capsule Take 240 mg by mouth daily.   Marland Kitchen DM-GG-Chlorphen-Acetaminophen (CORICIDIN HBP DAY/NIGHT COLD PO) Take 1 tablet by mouth 2 (two) times daily as needed (cold symptoms).  . docusate  sodium (COLACE) 100 MG capsule Take 100 mg by mouth daily as needed for mild constipation.  Marland Kitchen esomeprazole (NEXIUM) 40 MG capsule Take 40 mg by mouth daily as needed (acid reflux/indigestion.).   Marland Kitchen fluticasone (FLONASE) 50 MCG/ACT nasal spray Place 1 spray into both nostrils daily as needed for allergies or rhinitis.  . hydroxypropyl methylcellulose / hypromellose (ISOPTO TEARS / GONIOVISC) 2.5 % ophthalmic solution Place 1 drop into both eyes 3 (three) times daily as needed for dry eyes.  Marland Kitchen irbesartan (AVAPRO) 150 MG tablet Take 150 mg by mouth every evening.   . isosorbide mononitrate (IMDUR) 60 MG 24 hr tablet Take 60 mg by mouth every evening.   . loperamide (IMODIUM A-D) 2 MG tablet Take 2 mg by mouth 4 (four) times daily as needed for diarrhea or loose stools.  . Magnesium 400 MG TABS Take 400 mg by mouth every  other day. In the evening  . meloxicam (MOBIC) 15 MG tablet Take 15 mg by mouth daily as needed for pain.  . Menthol, Topical Analgesic, (BLUE-EMU MAXIMUM STRENGTH EX) Apply 1 application topically 3 (three) times daily as needed (pain.).  Marland Kitchen pseudoephedrine-acetaminophen (TYLENOL SINUS) 30-500 MG TABS tablet Take 1 tablet by mouth every 4 (four) hours as needed (sinus congestion).  . silodosin (RAPAFLO) 4 MG CAPS capsule Take 4 mg by mouth at bedtime.  . simethicone (MYLICON) 142 MG chewable tablet Chew 125 mg by mouth every 6 (six) hours as needed for flatulence. Takes gas x  . traMADol (ULTRAM) 50 MG tablet Take 50 mg by mouth 2 (two) times daily as needed (pain).   . Vitamin D, Ergocalciferol, (DRISDOL) 50000 UNITS CAPS Take 50,000 Units by mouth every Friday.      Allergies:   Penicillins, Other, and Mold extract [trichophyton]   Social History   Tobacco Use  . Smoking status: Current Every Day Smoker    Packs/day: 1.00    Years: 51.00    Pack years: 51.00    Types: Cigarettes  . Smokeless tobacco: Never Used  Substance Use Topics  . Alcohol use: Not Currently     Alcohol/week: 0.0 standard drinks  . Drug use: No     Family Hx: The patient's family history includes Breast cancer in his sister; Cancer in his mother and sister; Diabetes in his father; Hyperlipidemia in his brother, mother, and sister; Hypertension in his brother, father, mother, and sister; Kidney cancer in his mother; Other in his father; Prostate cancer in his brother. There is no history of Colon cancer, Colon polyps, Esophageal cancer, Rectal cancer, or Stomach cancer.  ROS:   Please see the history of present illness.    All other systems reviewed and are negative.   Prior CV studies:   The following studies were reviewed today: Echocardiogram 08/23/2019 1. The left ventricle has hyperdynamic systolic function, with an ejection fraction of >65%. The cavity size was normal. There is mild concentric left ventricular hypertrophy. Left ventricular diastolic Doppler parameters are consistent with impaired  relaxation.  2. The right ventricle has normal systolic function. The cavity was normal. There is no increase in right ventricular wall thickness. Right ventricular systolic pressure could not be assessed.  3. The aortic valve is tricuspid. Mild thickening of the aortic valve. Mild calcification of the aortic valve.  4. The aorta is normal unless otherwise noted.  Labs/Other Tests and Data Reviewed:    EKG:  No ECG reviewed.  Recent Labs: 08/14/2019: Magnesium 1.8 11/10/2019: Platelets 292 01/03/2020: BUN 24; Creatinine, Ser 1.80; Hemoglobin 14.3; Potassium 4.1; Sodium 137   Recent Lipid Panel No results found for: CHOL, TRIG, HDL, CHOLHDL, LDLCALC, LDLDIRECT  Wt Readings from Last 3 Encounters:  01/13/20 154 lb (69.9 kg)  01/03/20 154 lb 9.6 oz (70.1 kg)  11/28/19 157 lb (71.2 kg)     Objective:    Vital Signs:  Ht 5\' 4"  (1.626 m)   Wt 154 lb (69.9 kg)   BMI 26.43 kg/m  Limited due to telephone visit.   VITAL SIGNS:  reviewed GEN:  no acute distress NEURO:  alert  and oriented x 3, no obvious focal deficit PSYCH:  normal affect  ASSESSMENT & PLAN:    1. Hypertension: BP has been well controlled at frequent office visits with his nephrologist and urologist. He is medically compliant and without complaints. No changes in his regimen at this time.  2. PAD:  No complaints of IC symptoms. He is still waiting on hip replacement. He is undergoing treatment for urethral  obstruction currently.   3. COPD: Followed by pulmonary. Not on O2  4. Ongoing tobacco abuse: Talked about cessation today. He is trying to cut down. He doubts he will be able to quit.    COVID-19 Education: The signs and symptoms of COVID-19 were discussed with the patient and how to seek care for testing (follow up with PCP or arrange E-visit).  The importance of social distancing was discussed today. He is registered and waiting to get his vaccine.   Time:   Today, I have spent 15 minutes with the patient with telehealth technology discussing the above problems.     Medication Adjustments/Labs and Tests Ordered: Current medicines are reviewed at length with the patient today.  Concerns regarding medicines are outlined above.   Tests Ordered: No orders of the defined types were placed in this encounter.   Medication Changes: No orders of the defined types were placed in this encounter.   Disposition:  Follow up 6 months  Signed, Phill Myron. West Pugh, ANP, [facility]  01/13/2020 10:39 AM    [facility]

## 2020-01-13 ENCOUNTER — Telehealth (INDEPENDENT_AMBULATORY_CARE_PROVIDER_SITE_OTHER): Payer: Medicare PPO | Admitting: Adult Health

## 2020-01-13 ENCOUNTER — Encounter: Payer: Self-pay | Admitting: Adult Health

## 2020-01-13 VITALS — Ht 64.0 in | Wt 154.0 lb

## 2020-01-13 DIAGNOSIS — I10 Essential (primary) hypertension: Secondary | ICD-10-CM

## 2020-01-13 DIAGNOSIS — J449 Chronic obstructive pulmonary disease, unspecified: Secondary | ICD-10-CM

## 2020-01-13 DIAGNOSIS — E7800 Pure hypercholesterolemia, unspecified: Secondary | ICD-10-CM

## 2020-01-13 DIAGNOSIS — I739 Peripheral vascular disease, unspecified: Secondary | ICD-10-CM

## 2020-01-13 NOTE — Patient Instructions (Signed)
Medication Instructions:  Your physician recommends that you continue on your current medications as directed. Please refer to the Current Medication list given to you today.  If you need a refill on your cardiac medications before your next appointment, please call your pharmacy.   Lab work: NONE  Testing/Procedures: NONE  Follow-Up: At [facility], you and your health needs are our priority.  As part of our continuing mission to provide you with exceptional heart care, we have created designated Provider Care Teams.  These Care Teams include your primary Cardiologist (physician) and Advanced Practice Providers (APPs -  Physician Assistants and Nurse Practitioners) who all work together to provide you with the care you need, when you need it. You may see Pixie Casino, MD or one of the following Advanced Practice Providers on your designated Care Team:    Almyra Deforest, PA-C  Fabian Sharp, [place] or   Roby Lofts, [place]  Your physician wants you to follow-up in: 6 months with Dr. Debara Pickett

## 2020-01-27 ENCOUNTER — Other Ambulatory Visit: Payer: Self-pay

## 2020-01-31 ENCOUNTER — Ambulatory Visit: Payer: Medicare PPO | Attending: Internal Medicine

## 2020-01-31 DIAGNOSIS — Z23 Encounter for immunization: Secondary | ICD-10-CM

## 2020-01-31 NOTE — Progress Notes (Signed)
   Covid-19 Vaccination Clinic  Name:  SAMRAT HAYWARD    MRN: [id number] DOB: 10/03/1946  01/31/2020  Mr. Rovito was observed post Covid-19 immunization for 15 minutes without incidence. He was provided with Vaccine Information Sheet and instruction to access the V-Safe system.   Mr. Feil was instructed to call 911 with any severe reactions post vaccine: Marland Kitchen Difficulty breathing  . Swelling of your face and throat  . A fast heartbeat  . A bad rash all over your body  . Dizziness and weakness    Immunizations Administered    Name Date Dose VIS Date Route   Pfizer COVID-19 Vaccine 01/31/2020 12:09 PM 0.3 mL 11/29/2019 Intramuscular   Manufacturer: [place]   Lot: [id number]   [place]: [id number]

## 2020-02-05 ENCOUNTER — Other Ambulatory Visit (HOSPITAL_COMMUNITY): Payer: Medicare PPO

## 2020-02-12 ENCOUNTER — Other Ambulatory Visit (HOSPITAL_COMMUNITY)
Admission: RE | Admit: 2020-02-12 | Discharge: 2020-02-12 | Disposition: A | Discharge status: Home / Self Care | Payer: Medicare PPO | Source: Ambulatory Visit | Attending: Vascular Surgery | Admitting: Vascular Surgery

## 2020-02-12 DIAGNOSIS — Z01812 Encounter for preprocedural laboratory examination: Secondary | ICD-10-CM | POA: Diagnosis present

## 2020-02-12 DIAGNOSIS — Z20822 Contact with and (suspected) exposure to covid-19: Secondary | ICD-10-CM | POA: Insufficient documentation

## 2020-02-12 LAB — SARS CORONAVIRUS 2 (TAT 6-24 HRS): SARS Coronavirus 2: NEGATIVE

## 2020-02-14 ENCOUNTER — Other Ambulatory Visit: Payer: Self-pay

## 2020-02-14 ENCOUNTER — Encounter (HOSPITAL_COMMUNITY)
Admission: RE | Disposition: A | Discharge status: Home / Self Care | Payer: Self-pay | Source: Home / Self Care | Attending: Vascular Surgery

## 2020-02-14 ENCOUNTER — Ambulatory Visit (HOSPITAL_COMMUNITY)
Admission: RE | Admit: 2020-02-14 | Discharge: 2020-02-14 | Disposition: A | Discharge status: Home / Self Care | Payer: Medicare PPO | Attending: Vascular Surgery | Admitting: Vascular Surgery

## 2020-02-14 DIAGNOSIS — I2510 Atherosclerotic heart disease of native coronary artery without angina pectoris: Secondary | ICD-10-CM | POA: Diagnosis not present

## 2020-02-14 DIAGNOSIS — Z8249 Family history of ischemic heart disease and other diseases of the circulatory system: Secondary | ICD-10-CM | POA: Insufficient documentation

## 2020-02-14 DIAGNOSIS — J449 Chronic obstructive pulmonary disease, unspecified: Secondary | ICD-10-CM | POA: Insufficient documentation

## 2020-02-14 DIAGNOSIS — I701 Atherosclerosis of renal artery: Secondary | ICD-10-CM

## 2020-02-14 DIAGNOSIS — K219 Gastro-esophageal reflux disease without esophagitis: Secondary | ICD-10-CM | POA: Diagnosis not present

## 2020-02-14 DIAGNOSIS — I4891 Unspecified atrial fibrillation: Secondary | ICD-10-CM | POA: Diagnosis not present

## 2020-02-14 DIAGNOSIS — E785 Hyperlipidemia, unspecified: Secondary | ICD-10-CM | POA: Insufficient documentation

## 2020-02-14 DIAGNOSIS — Z79899 Other long term (current) drug therapy: Secondary | ICD-10-CM | POA: Insufficient documentation

## 2020-02-14 DIAGNOSIS — Z880 Allergy status to penicillin: Secondary | ICD-10-CM | POA: Diagnosis not present

## 2020-02-14 DIAGNOSIS — I129 Hypertensive chronic kidney disease with stage 1 through stage 4 chronic kidney disease, or unspecified chronic kidney disease: Secondary | ICD-10-CM | POA: Insufficient documentation

## 2020-02-14 DIAGNOSIS — Z7982 Long term (current) use of aspirin: Secondary | ICD-10-CM | POA: Insufficient documentation

## 2020-02-14 DIAGNOSIS — I739 Peripheral vascular disease, unspecified: Secondary | ICD-10-CM | POA: Diagnosis not present

## 2020-02-14 DIAGNOSIS — G4730 Sleep apnea, unspecified: Secondary | ICD-10-CM | POA: Diagnosis not present

## 2020-02-14 DIAGNOSIS — F17210 Nicotine dependence, cigarettes, uncomplicated: Secondary | ICD-10-CM | POA: Diagnosis not present

## 2020-02-14 DIAGNOSIS — N189 Chronic kidney disease, unspecified: Secondary | ICD-10-CM | POA: Insufficient documentation

## 2020-02-14 HISTORY — PX: RENAL ANGIOGRAPHY: CATH118260

## 2020-02-14 LAB — POCT ACTIVATED CLOTTING TIME
Activated Clotting Time: 197 seconds
Activated Clotting Time: 158 seconds

## 2020-02-14 LAB — POCT I-STAT, CHEM 8
BUN: 29 mg/dL — ABNORMAL HIGH (ref 8–23)
Calcium, Ion: 1.24 mmol/L (ref 1.15–1.40)
Chloride: 98 mmol/L (ref 98–111)
Creatinine, Ser: 2 mg/dL — ABNORMAL HIGH (ref 0.61–1.24)
Glucose, Bld: 75 mg/dL (ref 70–99)
HCT: 40 % (ref 39.0–52.0)
Hemoglobin: 13.6 g/dL (ref 13.0–17.0)
Potassium: 4.5 mmol/L (ref 3.5–5.1)
Sodium: 137 mmol/L (ref 135–145)
TCO2: 35 mmol/L — ABNORMAL HIGH (ref 22–32)

## 2020-02-14 SURGERY — RENAL ANGIOGRAPHY
Anesthesia: LOCAL

## 2020-02-14 MED ORDER — SODIUM CHLORIDE 0.9% FLUSH: 3.0000 mL | INTRAVENOUS | Status: DC | PRN

## 2020-02-14 MED ORDER — LABETALOL HCL 5 MG/ML IV SOLN: 10.0000 mg | INTRAVENOUS | Status: DC | PRN

## 2020-02-14 MED ORDER — IODIXANOL 320 MG/ML IV SOLN
INTRAVENOUS | Status: DC | PRN
  Administered 2020-02-14: 70 mL via INTRA_ARTERIAL

## 2020-02-14 MED ORDER — HEPARIN (PORCINE) IN NACL 1000-0.9 UT/500ML-% IV SOLN
INTRAVENOUS | Status: AC
  Filled 2020-02-14: qty 1000

## 2020-02-14 MED ORDER — OXYCODONE HCL 5 MG PO TABS: 5.0000 mg | ORAL_TABLET | ORAL | Status: DC | PRN

## 2020-02-14 MED ORDER — NITROGLYCERIN 1 MG/10 ML FOR IR/CATH LAB
INTRA_ARTERIAL | Status: DC | PRN
  Administered 2020-02-14: 200 ug via INTRA_ARTERIAL

## 2020-02-14 MED ORDER — FENTANYL CITRATE (PF) 100 MCG/2ML IJ SOLN
INTRAMUSCULAR | Status: AC
  Filled 2020-02-14: qty 2

## 2020-02-14 MED ORDER — SODIUM CHLORIDE 0.9 % IV SOLN
INTRAVENOUS | Status: AC | PRN
  Administered 2020-02-14: 100 mL/h via INTRAVENOUS

## 2020-02-14 MED ORDER — HYDRALAZINE HCL 20 MG/ML IJ SOLN: 5.0000 mg | INTRAMUSCULAR | Status: DC | PRN

## 2020-02-14 MED ORDER — MORPHINE SULFATE (PF) 2 MG/ML IV SOLN
INTRAVENOUS | Status: AC
  Filled 2020-02-14: qty 1

## 2020-02-14 MED ORDER — SODIUM CHLORIDE 0.9 % IV BOLUS
1000.0000 mL | Freq: Once | INTRAVENOUS | Status: AC
  Administered 2020-02-14: 1000 mL via INTRAVENOUS

## 2020-02-14 MED ORDER — HEPARIN (PORCINE) IN NACL 1000-0.9 UT/500ML-% IV SOLN
INTRAVENOUS | Status: DC | PRN
  Administered 2020-02-14: 500 mL

## 2020-02-14 MED ORDER — LIDOCAINE HCL (PF) 1 % IJ SOLN
INTRAMUSCULAR | Status: DC | PRN
  Administered 2020-02-14: 3 mL

## 2020-02-14 MED ORDER — SODIUM CHLORIDE 0.9 % IV SOLN: INTRAVENOUS | Status: AC

## 2020-02-14 MED ORDER — ACETAMINOPHEN 325 MG PO TABS: 650.0000 mg | ORAL_TABLET | ORAL | Status: DC | PRN

## 2020-02-14 MED ORDER — HEPARIN SODIUM (PORCINE) 1000 UNIT/ML IJ SOLN
INTRAMUSCULAR | Status: AC
  Filled 2020-02-14: qty 1

## 2020-02-14 MED ORDER — LIDOCAINE HCL (PF) 1 % IJ SOLN
INTRAMUSCULAR | Status: AC
  Filled 2020-02-14: qty 30

## 2020-02-14 MED ORDER — SODIUM CHLORIDE 0.9 % IV SOLN: 250.0000 mL | INTRAVENOUS | Status: DC | PRN

## 2020-02-14 MED ORDER — HEPARIN SODIUM (PORCINE) 1000 UNIT/ML IJ SOLN
INTRAMUSCULAR | Status: DC | PRN
  Administered 2020-02-14: 6000 [IU] via INTRAVENOUS
  Administered 2020-02-14: 3000 [IU] via INTRAVENOUS

## 2020-02-14 MED ORDER — SODIUM CHLORIDE 0.9% FLUSH: 3.0000 mL | Freq: Two times a day (BID) | INTRAVENOUS | Status: DC

## 2020-02-14 MED ORDER — ONDANSETRON HCL 4 MG/2ML IJ SOLN: 4.0000 mg | Freq: Four times a day (QID) | INTRAMUSCULAR | Status: DC | PRN

## 2020-02-14 MED ORDER — FENTANYL CITRATE (PF) 100 MCG/2ML IJ SOLN
INTRAMUSCULAR | Status: DC | PRN
  Administered 2020-02-14: 25 ug via INTRAVENOUS

## 2020-02-14 MED ORDER — SODIUM CHLORIDE 0.9 % IV SOLN: INTRAVENOUS | Status: DC

## 2020-02-14 MED ORDER — MORPHINE SULFATE (PF) 2 MG/ML IV SOLN
2.0000 mg | INTRAVENOUS | Status: DC | PRN
  Administered 2020-02-14: 2 mg via INTRAVENOUS

## 2020-02-14 SURGICAL SUPPLY — 19 items
CATH ANGIO 5F BER 100CM (CATHETERS) ×1 IMPLANT
CATH ANGIO 5F PIGTAIL 100CM (CATHETERS) ×1 IMPLANT
CATH QUICKCROSS SUPP .035X90CM (MICROCATHETER) ×1 IMPLANT
CATHETER LAUNCHER 6FR LCB (CATHETERS) ×1 IMPLANT
GLIDESHEATH SLEND SS 6F .021 (SHEATH) ×1 IMPLANT
KIT ENCORE 26 ADVANTAGE (KITS) ×1 IMPLANT
KIT MICROPUNCTURE NIT STIFF (SHEATH) ×1 IMPLANT
KIT PV (KITS) ×2 IMPLANT
SHEATH PINNACLE 6F 10CM (SHEATH) ×1 IMPLANT
SHEATH PROBE COVER 6X72 (BAG) ×1 IMPLANT
STOPCOCK MORSE 400PSI 3WAY (MISCELLANEOUS) ×2 IMPLANT
SYR MEDRAD MARK 7 150ML (SYRINGE) ×2 IMPLANT
TRANSDUCER W/STOPCOCK (MISCELLANEOUS) ×2 IMPLANT
TRAY PV CATH (CUSTOM PROCEDURE TRAY) ×2 IMPLANT
TUBING CIL FLEX 10 FLL-RA (TUBING) ×1 IMPLANT
TUBING HIGH PRESSURE 120CM (CONNECTOR) ×1 IMPLANT
WIRE HI TORQ VERSACORE J 260CM (WIRE) ×1 IMPLANT
WIRE ROSEN-J .035X260CM (WIRE) ×1 IMPLANT
WIRE STABILIZER XS .014X180CM (WIRE) ×1 IMPLANT

## 2020-02-14 NOTE — Progress Notes (Addendum)
Charge nurse here from 5N to attempt foley placement, Unable to place foley, Dr Donzetta Matters was called for coude cath order, 6N charge was called and will come and place.

## 2020-02-14 NOTE — Progress Notes (Signed)
Site area: leftleft brachial  Site Prior to Removal:  Level 0 0  Pressure Applied For 20 MINUTES    Minutes Beginning at 1100  Manual:   Yes.  yes  Patient Status During Pull: alert  Post Pull Groin Site:  Level 0 0  Post Pull Instructions Given:  Yes.   yes  Post Pull Pulses Present:  Yes.   yes  Dressing Applied:  Yes.   yes  Comments:  Patient tolerated well. No pain will continue to monitor

## 2020-02-14 NOTE — Progress Notes (Signed)
Attempted 52f foley insertion, unsuccessful. Pt tolerated well.

## 2020-02-14 NOTE — H&P (Signed)
Referring Physician: Dr. Reynaldo Minium  Patient name: Dean [name] MRN: [id number] DOB: [birth year] Sex: male  REASON FOR CONSULT: Renal artery stenosis  HPI:  Dean [name] is a 74 y.o. male, who has previously undergone femoral-femoral bypass by my partner Dr. Kellie Simmering many years ago. Recently he was scheduled to have a total hip replacement and was noted to have an elevated creatinine. He was sent back to Dr. Reynaldo Minium for evaluation of this. Renal duplex scan suggested left renal artery stenosis. Patient's creatinine in November of this year was 1.8. Of note there was also some hydronephrosis on that duplex scan. Patient denies any significant change in his blood pressure. He does not have any claudication symptoms. 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 He currently is on 3 blood pressure medications as well as isosorbide mononitrate. He is also on aspirin and statin. He does smoke.      Past Medical History:  Diagnosis Date  . Allergy    pollen  . Arthritis   . Asthma   . Atrial fibrillation ([place])    no documentation in epic/ pt states has never been told has A Fib  . CAD (coronary artery disease)    s/p angioplasty and stenting x3 1994  . Cataract    small, mild  . Chronic kidney disease    kidney cyst  . Complication of anesthesia    woke up during gall bladder sx yrs ago and with colonscopy  . COPD (chronic obstructive pulmonary disease) ([place])   . GERD (gastroesophageal reflux disease)   . History of kidney stones   . Hyperlipidemia   . Hypertension   . Neuromuscular disorder ([place])    nerve issues with back   . Peripheral vascular disease ([place])   . Pre-diabetes    none in last 6 months due to weight loss of 40 pounds  . PVC's (premature ventricular contractions)   . Sleep apnea    wears cpap        Past Surgical History:  Procedure Laterality Date  . APPENDECTOMY     . CHOLECYSTECTOMY  1990s   Gall Bladder  . COLONOSCOPY    . CORONARY ANGIOPLASTY WITH STENT PLACEMENT  1994  . EYE SURGERY Bilateral 2019   cataracts  . lumbar disectomy  2005   L3 - 4 - 5  . POLYPECTOMY    . PR VEIN BYPASS GRAFT,AORTO-FEM-POP  08/31/11   Right to Left Fem-Fem  . TONSILLECTOMY    . UMBILICAL HERNIA REPAIR          Family History  Problem Relation Age of Onset  . Other Father    ETOH  . Diabetes Father   . Hypertension Father   . Kidney cancer Mother   . Cancer Mother    renal  . Hyperlipidemia Mother   . Hypertension Mother   . Cancer Sister    breast  . Hyperlipidemia Sister   . Hypertension Sister   . Breast cancer Sister   . Hyperlipidemia Brother   . Hypertension Brother   . Prostate cancer Brother   . Colon cancer Neg Hx   . Colon polyps Neg Hx   . Esophageal cancer Neg Hx   . Rectal cancer Neg Hx   . Stomach cancer Neg Hx    SOCIAL HISTORY:  Social History        Socioeconomic History  .  Marital status: Single    Spouse name: Not on file  . Number of children: Not on file  . Years of education: Not on file  . Highest education level: Not on file  Occupational History  . 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 Smoking status: Current Every Day Smoker    Packs/day: 1.00    Years: 47.00    Pack years: 47.00    Types: Cigarettes  . Smokeless tobacco: Never Used  Substance and Sexual Activity  . Alcohol use: Not Currently    Alcohol/week: 0.0 standard drinks  . Drug use: No  . Sexual activity: Not on file  Other Topics Concern  . Not on file  Social History Narrative   Travelled to [place] and [place] 2008, [place] 2009   Social Determinants of Health      Financial Resource Strain:   . Difficulty of Paying Living Expenses: Not on file  Food Insecurity:   . Worried About Charity fundraiser in the Last Year: Not on file  . Ran Out of Food in the Last Year: Not on file  Transportation  Needs:   . Lack of Transportation (Medical): Not on file  . Lack of Transportation (Non-Medical): Not on file  Physical Activity:   . Days of Exercise per Week: Not on file  . Minutes of Exercise per Session: Not on file  Stress:   . Feeling of Stress : Not on file  Social Connections:   . Frequency of Communication with Friends and Family: Not on file  . Frequency of Social Gatherings with Friends and Family: Not on file  . Attends Religious Services: Not on file  . Active Member of Clubs or Organizations: Not on file  . Attends Archivist Meetings: Not on file  . Marital Status: Not on file  Intimate Partner Violence:   . Fear of Current or Ex-Partner: Not on file  . Emotionally Abused: Not on file  . Physically Abused: Not on file  . Sexually Abused: Not on file        Allergies  Allergen Reactions  . Penicillins Palpitations    REACTION: rapid pulse  Did it involve swelling of the face/tongue/throat, SOB, or low BP? Yes  Did it involve sudden or severe rash/hives, skin peeling, or any reaction on the inside of your mouth or nose? Unknown  Did you need to seek medical attention at a hospital or doctor's office? yes  When did it last happen? childhood  If all above answers are "NO", may proceed with cephalosporin use.   . Mold Extract [Trichophyton]     rash  . Other     Oily seafood salmon chest pain    No current facility-administered medications on file prior to encounter.   Current Outpatient Medications on File Prior to Encounter  Medication Sig Dispense Refill  . albuterol (PROVENTIL HFA) 108 (90 Base) MCG/ACT inhaler Inhale 1 puff into the lungs 2 (two) times daily. 8 g 5  . albuterol (PROVENTIL) (2.5 MG/3ML) 0.083% nebulizer solution USE 1 VIAL VIA NEBULIZER EVERY 6 HOURS AS NEEDED FOR WHEEZING OR SHORTNESS OF BREATH (Patient taking differently: Take 2.5 mg by nebulization every 6 (six) hours as needed for wheezing. ) 225 mL 12  . antiseptic oral rinse  (BIOTENE) LIQD 15 mLs by Mouth Rinse route as needed for dry mouth.    Marland Kitchen aspirin 325 MG tablet Take 325 mg by mouth daily with supper.     Marland Kitchen  atorvastatin (LIPITOR) 10 MG tablet Take 10 mg by mouth every evening.     . Azelastine-Fluticasone 137-50 MCG/ACT SUSP PLACE 1-2 SPRAYS INTO EACH NOSTRIL AT BEDTIME (Patient taking differently: Place 1-2 sprays into both nostrils 2 (two) times daily as needed (congestion.). ) 23 g 3  . diltiazem (TIAZAC) 240 MG 24 hr capsule Take 240 mg by mouth daily.     Marland Kitchen docusate sodium (COLACE) 100 MG capsule Take 100 mg by mouth daily as needed for mild constipation.    Marland Kitchen esomeprazole (NEXIUM) 40 MG capsule Take 40 mg by mouth daily as needed (acid reflux/indigestion.).     Marland Kitchen finasteride (PROSCAR) 5 MG tablet Take 5 mg by mouth at bedtime.     . hydroxypropyl methylcellulose / hypromellose (ISOPTO TEARS / GONIOVISC) 2.5 % ophthalmic solution Place 1 drop into both eyes 3 (three) times daily as needed for dry eyes.    Marland Kitchen irbesartan (AVAPRO) 150 MG tablet Take 150 mg by mouth every evening.     . isosorbide mononitrate (IMDUR) 60 MG 24 hr tablet Take 60 mg by mouth every evening.     . loperamide (IMODIUM A-D) 2 MG tablet Take 2 mg by mouth 4 (four) times daily as needed for diarrhea or loose stools.    . Magnesium 400 MG TABS Take 400 mg by mouth every other day. In the evening    . meloxicam (MOBIC) 15 MG tablet Take 15 mg by mouth daily as needed for pain.    . Menthol, Topical Analgesic, (BLUE-EMU MAXIMUM STRENGTH EX) Apply 1 application topically 3 (three) times daily as needed (pain.).    Marland Kitchen pseudoephedrine-acetaminophen (TYLENOL SINUS) 30-500 MG TABS tablet Take 1 tablet by mouth every 4 (four) hours as needed (sinus congestion).    . simethicone (MYLICON) 176 MG chewable tablet Chew 125 mg by mouth every 6 (six) hours as needed for flatulence. Takes gas x    . Vitamin D, Ergocalciferol, (DRISDOL) 50000 UNITS CAPS Take 50,000 Units by mouth every Friday at 6 PM.      . tamsulosin (FLOMAX) 0.4 MG CAPS capsule Take 0.4 mg by mouth at bedtime.      ROS:  General: No weight loss, Fever, chills  HEENT: No recent headaches, no nasal bleeding, no visual changes, no sore throat  Neurologic: No dizziness, blackouts, seizures. No recent symptoms of stroke or mini- stroke. No recent episodes of slurred speech, or temporary blindness.  Cardiac: No recent episodes of chest pain/pressure, no shortness of breath at rest. No shortness of breath with exertion. Denies history of atrial fibrillation or irregular heartbeat  Vascular: No history of rest pain in feet. No history of claudication. No history of non-healing ulcer, No history of DVT  Pulmonary: No home oxygen, no productive cough, no hemoptysis, No asthma or wheezing  Musculoskeletal: [X]  Arthritis, [ ]  Low back pain, [X]  Joint pain  Hematologic:No history of hypercoagulable state. No history of easy bleeding. No history of anemia  Gastrointestinal: No hematochezia or melena, No gastroesophageal reflux, no trouble swallowing  Urinary: [X]  chronic Kidney disease, [ ]  on HD - [ ]  MWF or [ ]  TTHS, [ ]  Burning with urination, [ ]  Frequent urination, [ ]  Difficulty urinating;  Skin: No rashes  Psychological: No history of anxiety, No history of depression  Physical Examination    Vitals:   02/14/20 0604  BP: 134/75  Pulse: 63  Resp: 17  Temp: 98.3 F (36.8 C)  TempSrc: Oral  SpO2: 95%  Weight: 69.4  kg  Height: 5\' 4"  (1.626 m)   General: Alert and oriented, no acute distress  HEENT: Normal  Neck: No JVD  Cardiac: Regular Rate and Rhythm  Abdomen: Soft, non-tender, non-distended, no mass  Skin: No rash  Extremity Pulses: 2+ radial, brachial, femoral, 2+ femorofemoral graft absent dorsalis pedis, posterior tibial pulses bilaterally  Musculoskeletal: No deformity or edema  Neurologic: Upper and lower extremity motor 5/5 and symmetric  DATA:  I reviewed the patient's renal duplex exam which suggested a 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   PLAN: Aortogram with mesenteric renal angiogram possible renal intervention scheduled for today. We will do this through a left brachial approach to give [place] a better approach to the renal artery and also avoid his femorofemoral bypass. Risk benefits possible complications of procedure details including but not limited to bleeding infection vessel injury were discussed with patient today. He understands and agrees to proceed.   Ruta Hinds, MD  [facility]  Office: [phone]  Pager: [phone]

## 2020-02-14 NOTE — Op Note (Signed)
Procedure: Abdominal aortogram with selective left renal angiogram  Preoperative diagnosis: Left renal artery stenosis  Postoperative diagnosis: Same  Anesthesia: Local with IV sedation  Total contrast 75 cc  Operative findings: Heavily calcified left renal artery with approximately 50% narrowing no obvious pressure gradient  Widely patent right renal artery  Mesenteric's not evaluated due to limitations of contrast with patient's elevated creatinine  Left brachial puncture 6 French sheath  Operative details: After obtaining form consent, the patient taken to the [place] lab.  Patient placed supine position on the angio table.  Patient's left upper extremities prepped and draped in usual sterile fashion.  Local anesthesia was infiltrated over the left brachial artery.  Ultrasound was used to identify the left brachial artery near the antecubital area and a micropuncture unit needle was used to selectively catheterize this and the micropuncture wire advanced into the brachial artery.  Micropuncture sheath placed over this.  This was then swapped out for an 035 versa core wire a 6 French sheath placed over this.  The sheath was flushed with 3000 units of heparin and 200 mcg of nitroglycerin.  The versa core wire was advanced up into the ascending aorta.  5 French pigtail catheter was then advanced over this but I could not direct this into the descending thoracic aorta.  This was swapped out for a 90 degree Berenstein catheter which I was able to direct into the descending thoracic aorta and with the assistance of an 035 quick cross I could advance an 035 Rosen wire into the abdominal aorta.  A 5 French pigtail catheter was then advanced over the Rosen wire and abdominal aortogram was obtained in multiple projections.  Right renal artery is widely patent.  There are bilateral common iliac stents.  The infrarenal abdominal aorta is patent.  There is suggestion with calcified rim but there may be an  abdominal aortic aneurysm.  There is overlap of the superior mesenteric artery and the left renal artery.  Therefore we did several oblique views to try to decrease this overlap.  I still could not visualize the origin of the left renal artery that well and there was some calcium in the area so the pigtail catheter was swapped out over a guidewire for a coronary guide catheter.  This was advanced over the wire down to the origin left renal and then a 018 stabilizer wire advanced across the left renal artery and into the left renal parenchyma.  Selective contrast angiograms were then performed.  With the guide catheter engaged in the proximal left renal artery there was no significant pressure gradient.  There was calcification over the origin of about the first few millimeters.  Contrast angiogram in multiple views did not show any narrowing much more than about 50%.  At this point the guide and stabilizer wire were removed.  Although the patient does have a suggestion of mesenteric artery stenosis on duplex exam he is asymptomatic.  We had already given 75 cc of contrast so these were not evaluated since the patient is currently asymptomatic.  The sheath was left in place to be pulled in the holding area.    Of note the patient also has calcification lining the abdominal aorta suggestive of abdominal aortic aneurysm.  I could not find any documentation on previous imaging of this.  Previous aortic ultrasounds have not visualized his iliac and aortic system very well due to obesity and overlying bowel gas.  Therefore at his follow-up visit with me we will obtain  a noncontrast CT scan of the abdomen and pelvis to make sure that he does not have a sizable infrarenal abdominal aortic aneurysm.  Ruta Hinds, MD [facility] Office: [phone]

## 2020-02-14 NOTE — Progress Notes (Signed)
Coude cath was inserted by 6N charge Nurse, pt was instructed he was to call his urologist Monday morning for removal/ pt verbalized understanding. He was instructed on foley cath care and to keep the bag below his knees to prevent back flow.

## 2020-02-14 NOTE — Discharge Instructions (Signed)
Drink plenty of fluids next 48 hours  Brachial Site Care  This sheet gives you information about how to care for yourself after your procedure. Your health care provider may also give you more specific instructions. If you have problems or questions, contact your health care provider. What can I expect after the procedure? After the procedure, it is common to have:  Bruising and tenderness at the catheter insertion area. Follow these instructions at home: Medicines  Take over-the-counter and prescription medicines only as told by your health care provider. Insertion site care  Follow instructions from your health care provider about how to take care of your insertion site. Make sure you: ? Wash your hands with soap and water before you change your bandage (dressing). If soap and water are not available, use hand sanitizer. ? Remove your dressing as told by your health care provider. In 24 hours  Check your insertion site every day for signs of infection. Check for: ? Redness, swelling, or pain. ? Fluid or blood. ? Pus or a bad smell. ? Warmth.  Do not take baths, swim, or use a hot tub until your health care provider approves.  You may shower 24-48 hours after the procedure, or as directed by your health care provider. ? Remove the dressing and gently wash the site with plain soap and water. ? Pat the area dry with a clean towel. ? Do not rub the site. That could cause bleeding.  Do not apply powder or lotion to the site. Activity   For 24 hours after the procedure, or as directed by your health care provider: ? Do not flex or bend the affected arm. ? Do not push or pull heavy objects with the affected arm. ? Do not drive yourself home from the hospital or clinic. You may drive 24 hours after the procedure unless your health care provider tells you not to. ? Do not operate machinery or power tools.  Do not lift anything that is heavier than 10 lb (4.5 kg), or the limit that  you are told, until your health care provider says that it is safe. For 5 days  Ask your health care provider when it is okay to: ? Return to work or school. ? Resume usual physical activities or sports. ? Resume sexual activity. General instructions  If the catheter site starts to bleed, raise your arm and put firm pressure on the site. If the bleeding does not stop, get help right away. This is a medical emergency.  If you went home on the same day as your procedure, a responsible adult should be with you for the first 24 hours after you arrive home.  Keep all follow-up visits as told by your health care provider. This is important. Contact a health care provider if:  You have a fever.  You have redness, swelling, or yellow drainage around your insertion site. Get help right away if:  You have unusual pain at the brachial site.  The catheter insertion area swells very fast.  The insertion area is bleeding, and the bleeding does not stop when you hold steady pressure on the area.  Your arm or hand becomes pale, cool, tingly, or numb. These symptoms may represent a serious problem that is an emergency. Do not wait to see if the symptoms will go away. Get medical help right away. Call your local emergency services (911 in the [place] Do not drive yourself to the hospital. Summary  After the procedure, it is  common to have bruising and tenderness at the site.  Follow instructions from your health care provider about how to take care of your radial site wound. Check the wound every day for signs of infection.  Do not lift anything that is heavier than 10 lb (4.5 kg), or the limit that you are told, until your health care provider says that it is safe. This information is not intended to replace advice given to you by your health care provider. Make sure you discuss any questions you have with your health care provider. Document Revised: 01/10/2018 Document Reviewed:  01/10/2018 Elsevier Patient Education  2020 [employer].

## 2020-02-14 NOTE — Progress Notes (Signed)
At 1400 arm check, bicep area was swollen and hard. BP cuff placed to hold pressure on hematoma. Aldona Bar, [place] paged to come to bedside. RN also informed PA that pt has not been able to urinate much during recovery.   Update: 1455: pt's arm looks much better, PA agrees. RN instructed to place 16 Fr foley to send pt home with until office visit.

## 2020-02-14 NOTE — Progress Notes (Addendum)
Called to see pt about hematoma left arm s/p aortogram.  Cuff in place left upper arm.  This was removed.  RN reports the hematoma is smaller.  His left radial pulse is easily palpable.  Motor and sensory are in tact left hand.  Will continue to observe this for a while longer.  Went back to see pt and his hematoma is small and continues to have bounding left radial pulse.  Discussed with him should he have any issues over the weekend, to contact us or return to ER.   RN reports that pt is having a hard time voiding and only able to void in small increments.  Given his urologic procedure in January by Dr. Junious Silk, I called MD on call, Dr. Darrol Angel and he said to place 46F foley and have him call their office on Monday for follow up and removal of catheter.  I discussed this with pt and he is in agreement.  Leontine Locket, [facility] 02/14/2020 2:35 PM

## 2020-02-17 ENCOUNTER — Other Ambulatory Visit: Payer: Self-pay

## 2020-02-17 ENCOUNTER — Ambulatory Visit (INDEPENDENT_AMBULATORY_CARE_PROVIDER_SITE_OTHER): Payer: Medicare PPO | Admitting: Physician Assistant

## 2020-02-17 VITALS — BP 112/52 | HR 54 | Temp 96.6°F | Resp 18 | Ht 64.5 in | Wt 153.0 lb

## 2020-02-17 DIAGNOSIS — Z9889 Other specified postprocedural states: Secondary | ICD-10-CM

## 2020-02-17 DIAGNOSIS — I7092 Chronic total occlusion of artery of the extremities: Secondary | ICD-10-CM

## 2020-02-17 DIAGNOSIS — I779 Disorder of arteries and arterioles, unspecified: Secondary | ICD-10-CM

## 2020-02-17 NOTE — Progress Notes (Signed)
HISTORY AND PHYSICAL     CC:  follow up Requesting Provider:  [name], MD  HPI: Dean [name] is a 74 y.o. (March 18, 1946) male who presents today with c/o some bloody ooze on his bandage.    He denies any pain or numbness in his left hand or arm.  He says he has bruising on his left arm.  He underwent an aortogram via left brachial artery on 02/14/2020 by Dr. Oneida Alar to evaluate left RAS.   After his procedure, he was found to have a hematoma of the left upper arm.  The RN reported that it was smaller after the cuff was removed.  His left radial pulse was easily palpable and motor and sensory were in tact.    He also had difficulty voiding after the procedure.   Given his urologic procedure in January by Dr. Junious Silk, the on call MD for urology was called, Dr. Darrol Angel and he said to place 39F foley and have him call their office on Monday for follow up and removal of catheter.   He states he is going in the morning to have the catheter removed.    The pt is on a statin for cholesterol management.  The pt is on a daily aspirin.   Other AC:  none The pt is on ARB for hypertension.   The pt is not diabetic.   Tobacco hx:  Current-says he has a lot going on and won't be able to quit right now.   Past Medical History:  Diagnosis Date  . Allergy    pollen  . Arthritis   . Asthma   . Atrial fibrillation ([place])    no documentation in epic/ pt states has never been told has A Fib  . CAD (coronary artery disease)    s/p angioplasty and stenting x3 1994, pt states has this done  . Cataract    small, mild  . Chronic kidney disease    kidney cyst  . Complication of anesthesia    woke up during gall bladder sx yrs ago and with colonscopy  . Complication of anesthesia    pt thinks stopped breathing after gallbladder sx years ago, no record found  . COPD (chronic obstructive pulmonary disease) ([place])   . GERD (gastroesophageal reflux disease)    hx of  . History of kidney stones    . Hyperlipidemia   . Hypertension   . Neuromuscular disorder ([place])    nerve issues with back   . Peripheral vascular disease ([place])   . PONV (postoperative nausea and vomiting)    none recent  . Pre-diabetes    none in last 6 months due to weight loss of 40 pounds  . PVC's (premature ventricular contractions)   . Sleep apnea    wears cpap auto set 5 to 20    Past Surgical History:  Procedure Laterality Date  . APPENDECTOMY    . CHOLECYSTECTOMY  1990s   Gall Bladder  . COLONOSCOPY    . CORONARY ANGIOPLASTY WITH STENT PLACEMENT  1994  . CYSTOSCOPY WITH URETEROSCOPY AND STENT PLACEMENT Bilateral 01/03/2020   Procedure: CYSTOSCOPY WITH RETROGRADE/ URETEROSCOPY AND RIGHT STENT PLACEMENT BLADDER BIOPSY;  Surgeon: Festus Aloe, MD;  Location: [facility];  Service: Urology;  Laterality: Bilateral;  ONLY NEEDS 30 MIN  . EYE SURGERY Bilateral 2019   cataracts  . lumbar disectomy  2005   L3 - 4 - 5  . POLYPECTOMY    . PR VEIN BYPASS GRAFT,AORTO-FEM-POP  08/31/11   Right to Left Fem-Fem  . RENAL ANGIOGRAPHY N/A 02/14/2020   Procedure: RENAL ANGIOGRAPHY;  Surgeon: Elam Dutch, MD;  Location: [facility];  Service: Cardiovascular;  Laterality: N/A;  Bilateral  . TONSILLECTOMY    . UMBILICAL HERNIA REPAIR      Social History   Socioeconomic History  . Marital status: Single    Spouse name: Not on file  . Number of children: Not on file  . Years of education: Not on file  . Highest education level: Not on file  Occupational History  . 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 Smoking status: Current Every Day Smoker    Packs/day: 1.00    Years: 51.00    Pack years: 51.00    Types: Cigarettes  . Smokeless tobacco: Never Used  Substance and Sexual Activity  . Alcohol use: Not Currently    Alcohol/week: 0.0 standard drinks  . Drug use: No  . Sexual activity: Not on file  Other Topics Concern  . Not on file   Social History Narrative   Travelled to [place] and [place] 2008, [place] 2009   Social Determinants of Health   Financial Resource Strain:   . Difficulty of Paying Living Expenses: Not on file  Food Insecurity:   . Worried About Charity fundraiser in the Last Year: Not on file  . Ran Out of Food in the Last Year: Not on file  Transportation Needs:   . Lack of Transportation (Medical): Not on file  . Lack of Transportation (Non-Medical): Not on file  Physical Activity:   . Days of Exercise per Week: Not on file  . Minutes of Exercise per Session: Not on file  Stress:   . Feeling of Stress : Not on file  Social Connections:   . Frequency of Communication with Friends and Family: Not on file  . Frequency of Social Gatherings with Friends and Family: Not on file  . Attends Religious Services: Not on file  . Active Member of Clubs or Organizations: Not on file  . Attends Archivist Meetings: Not on file  . Marital Status: Not on file  Intimate Partner Violence:   . Fear of Current or Ex-Partner: Not on file  . Emotionally Abused: Not on file  . Physically Abused: Not on file  . Sexually Abused: Not on file    Family History  Problem Relation Age of Onset  . Other Father        ETOH  . Diabetes Father   . Hypertension Father   . Kidney cancer Mother   . Cancer Mother        renal  . Hyperlipidemia Mother   . Hypertension Mother   . Cancer Sister        breast  . Hyperlipidemia Sister   . Hypertension Sister   . Breast cancer Sister   . Hyperlipidemia Brother   . Hypertension Brother   . Prostate cancer Brother   . Colon cancer Neg Hx   . Colon polyps Neg Hx   . Esophageal cancer Neg Hx   . Rectal cancer Neg Hx   . Stomach cancer Neg Hx     Current Outpatient Medications  Medication Sig Dispense Refill  . albuterol (PROVENTIL HFA) 108 (90 Base) MCG/ACT inhaler Inhale 1 puff into the lungs 2 (two) times daily. 8 g 5  . albuterol (PROVENTIL) (2.5  MG/3ML) 0.083% nebulizer solution USE  1 VIAL VIA NEBULIZER EVERY 6 HOURS AS NEEDED FOR WHEEZING OR SHORTNESS OF BREATH (Patient taking differently: Take 2.5 mg by nebulization every 6 (six) hours as needed for wheezing. ) 225 mL 12  . antiseptic oral rinse (BIOTENE) LIQD 15 mLs by Mouth Rinse route as needed for dry mouth.    Marland Kitchen aspirin 325 MG tablet Take 325 mg by mouth daily with supper.     Marland Kitchen atorvastatin (LIPITOR) 10 MG tablet Take 10 mg by mouth every evening.     . Azelastine-Fluticasone 137-50 MCG/ACT SUSP PLACE 1-2 SPRAYS INTO EACH NOSTRIL AT BEDTIME (Patient taking differently: Place 1-2 sprays into both nostrils 2 (two) times daily as needed (congestion.). ) 23 g 3  . diltiazem (TIAZAC) 240 MG 24 hr capsule Take 240 mg by mouth daily.     Marland Kitchen docusate sodium (COLACE) 100 MG capsule Take 100 mg by mouth daily as needed for mild constipation.    Marland Kitchen esomeprazole (NEXIUM) 40 MG capsule Take 40 mg by mouth daily as needed (acid reflux/indigestion.).     Marland Kitchen finasteride (PROSCAR) 5 MG tablet Take 5 mg by mouth at bedtime.     . hydroxypropyl methylcellulose / hypromellose (ISOPTO TEARS / GONIOVISC) 2.5 % ophthalmic solution Place 1 drop into both eyes 3 (three) times daily as needed for dry eyes.    Marland Kitchen irbesartan (AVAPRO) 150 MG tablet Take 150 mg by mouth every evening.     . isosorbide mononitrate (IMDUR) 60 MG 24 hr tablet Take 60 mg by mouth every evening.     . loperamide (IMODIUM A-D) 2 MG tablet Take 2 mg by mouth 4 (four) times daily as needed for diarrhea or loose stools.    . Magnesium 400 MG TABS Take 400 mg by mouth every other day. In the evening    . meloxicam (MOBIC) 15 MG tablet Take 15 mg by mouth daily as needed for pain.    . Menthol, Topical Analgesic, (BLUE-EMU MAXIMUM STRENGTH EX) Apply 1 application topically 3 (three) times daily as needed (pain.).    Marland Kitchen pseudoephedrine-acetaminophen (TYLENOL SINUS) 30-500 MG TABS tablet Take 1 tablet by mouth every 4 (four) hours as needed (sinus  congestion).    . simethicone (MYLICON) 846 MG chewable tablet Chew 125 mg by mouth every 6 (six) hours as needed for flatulence. Takes gas x    . tamsulosin (FLOMAX) 0.4 MG CAPS capsule Take 0.4 mg by mouth at bedtime.     . Vitamin D, Ergocalciferol, (DRISDOL) 50000 UNITS CAPS Take 50,000 Units by mouth every Friday at 6 PM.      No current facility-administered medications for this visit.    Allergies  Allergen Reactions  . Penicillins Palpitations    REACTION: rapid pulse Did it involve swelling of the face/tongue/throat, SOB, or low BP? Yes Did it involve sudden or severe rash/hives, skin peeling, or any reaction on the inside of your mouth or nose? Unknown Did you need to seek medical attention at a hospital or doctor's office? yes When did it last happen?childhood  If all above answers are "NO", may proceed with cephalosporin use.   . Other     Oily seafood salmon-- chest pain  . Mold Extract [Trichophyton] Rash    rash     REVIEW OF SYSTEMS:  See HPI  PHYSICAL EXAMINATION:  Today's Vitals   02/17/20 1504  BP: (!) 112/52  Pulse: (!) 54  Resp: 18  Temp: (!) 96.6 F (35.9 C)  TempSrc: Temporal  SpO2:  97%  Weight: 153 lb (69.4 kg)  Height: 5' 4.5" (1.638 m)   Body mass index is 25.86 kg/m.   General:  WDWN in NAD; vital signs documented above Gait: Not observed-in wheelchair  HENT: WNL, normocephalic Pulmonary: normal non-labored breathing Cardiac: regular HR Skin:  Ecchymosis left arm above the antecubital space, in the antecubital space and some below the antecubital space Vascular Exam/Pulses: Easily palpable left radial pulse.  His left arm is softer than Friday afternoon and is non tender to palpation.  There is no erythema or warmth.  There is no bruit heard and cannot feel a thrill.  His motor and sensation is in tact left hand.  Extremities: without ischemic changes, without Gangrene , without cellulitis; without open wounds;  Musculoskeletal:  no muscle wasting or atrophy  Neurologic: A&O X 3;  No focal weakness or paresthesias are detected Psychiatric:  The pt has Normal affect.   Non-Invasive Vascular Imaging:   None today    ASSESSMENT/PLAN:: 74 y.o. male who is s/p aortogram to evaluate RAS who presents today for some oozing on his dressing.   -his left upper arm is much softer than when I saw him on Friday post cath.  There is low suspicion for psa as there is no erythema, area is soft without hematoma, non tender.  No bruit is heard over the brachial artery.  He does have some ecchymosis, but this is not unexpected given he did have a hematoma after his procedure.  His motor and sensory are in tact left hand with easily palpable left radial pulse.   Also had other PA to evaluate and we both have low suspicion for psa. -he has a follow up appt with Dr. Oneida Alar on 02/27/2020 after CT scan.   -discussed with pt should he have any changes to his left arm with swelling, redness or pain, he should call us sooner.  He expresses understanding.    Leontine Locket, PA-C [facility] [phone]  Clinic MD:   Trula Slade

## 2020-02-18 ENCOUNTER — Ambulatory Visit: Payer: Medicare PPO

## 2020-02-19 ENCOUNTER — Telehealth: Payer: Self-pay

## 2020-02-19 ENCOUNTER — Other Ambulatory Visit: Payer: Self-pay

## 2020-02-19 DIAGNOSIS — I7092 Chronic total occlusion of artery of the extremities: Secondary | ICD-10-CM

## 2020-02-19 DIAGNOSIS — I779 Disorder of arteries and arterioles, unspecified: Secondary | ICD-10-CM

## 2020-02-19 NOTE — Telephone Encounter (Signed)
Avril with [facility] called.  Patient's creatinine was 2.0 today.  She saw in Dr. Nona Dell hospital Op note that he wanted the patient to follow up with a CT Ab/Pel without contrast.  The current order on file is for a CTA Ab/Pel with and without contrast.  I added the CT without contrast to the patient's order to be done in place of the CTA.    Thurston Hole., LPN

## 2020-02-24 ENCOUNTER — Ambulatory Visit: Payer: Medicare PPO | Attending: Internal Medicine

## 2020-02-24 DIAGNOSIS — Z23 Encounter for immunization: Secondary | ICD-10-CM | POA: Insufficient documentation

## 2020-02-24 NOTE — Progress Notes (Signed)
   Covid-19 Vaccination Clinic  Name:  JENCARLO BONADONNA    MRN: [id number] DOB: [birth year]  02/24/2020  Mr. Hartin was observed post Covid-19 immunization for 15 minutes without incident. He was provided with Vaccine Information Sheet and instruction to access the V-Safe system.   Mr. Kowalewski was instructed to call 911 with any severe reactions post vaccine: Marland Kitchen Difficulty breathing  . Swelling of face and throat  . A fast heartbeat  . A bad rash all over body  . Dizziness and weakness   Immunizations Administered    Name Date Dose VIS Date Route   Pfizer COVID-19 Vaccine 02/24/2020 11:54 AM 0.3 mL 11/29/2019 Intramuscular   Manufacturer: [place]   Lot: [id number]   [place]: [id number]

## 2020-02-25 ENCOUNTER — Other Ambulatory Visit: Payer: Self-pay

## 2020-02-25 ENCOUNTER — Inpatient Hospital Stay: Admission: RE | Admit: 2020-02-25 | Payer: Medicare PPO | Source: Ambulatory Visit

## 2020-02-25 DIAGNOSIS — I998 Other disorder of circulatory system: Secondary | ICD-10-CM

## 2020-02-25 DIAGNOSIS — I739 Peripheral vascular disease, unspecified: Secondary | ICD-10-CM

## 2020-02-26 ENCOUNTER — Encounter (HOSPITAL_COMMUNITY): Payer: Self-pay

## 2020-02-26 ENCOUNTER — Ambulatory Visit (HOSPITAL_COMMUNITY)
Admission: RE | Admit: 2020-02-26 | Discharge: 2020-02-26 | Disposition: A | Discharge status: Home / Self Care | Payer: Medicare PPO | Source: Ambulatory Visit | Attending: Vascular Surgery | Admitting: Vascular Surgery

## 2020-02-26 ENCOUNTER — Other Ambulatory Visit: Payer: Self-pay

## 2020-02-26 ENCOUNTER — Telehealth (HOSPITAL_COMMUNITY): Payer: Self-pay

## 2020-02-26 DIAGNOSIS — I739 Peripheral vascular disease, unspecified: Secondary | ICD-10-CM | POA: Insufficient documentation

## 2020-02-26 DIAGNOSIS — Z95828 Presence of other vascular implants and grafts: Secondary | ICD-10-CM | POA: Insufficient documentation

## 2020-02-26 DIAGNOSIS — K5730 Diverticulosis of large intestine without perforation or abscess without bleeding: Secondary | ICD-10-CM | POA: Insufficient documentation

## 2020-02-26 DIAGNOSIS — M898X5 Other specified disorders of bone, thigh: Secondary | ICD-10-CM | POA: Diagnosis not present

## 2020-02-26 DIAGNOSIS — I714 Abdominal aortic aneurysm, without rupture: Secondary | ICD-10-CM | POA: Insufficient documentation

## 2020-02-26 NOTE — Telephone Encounter (Signed)
The above patient or their representative was contacted and gave the following answers to these questions:         Do you have any of the following symptoms?    NO  Fever                    Cough                   Shortness of breath  Do  you have any of the following other symptoms?    muscle pain         vomiting,        diarrhea        rash         weakness        red eye        abdominal pain         bruising          bruising or bleeding              joint pain           severe headache    Have you been in contact with someone who was or has been sick in the past 2 weeks?  NO  Yes                 Unsure                         Unable to assess   Does the person that you were in contact with have any of the following symptoms?   Cough         shortness of breath           muscle pain         vomiting,            diarrhea            rash            weakness           fever            red eye           abdominal pain           bruising  or  bleeding                joint pain                severe headache                 COMMENTS OR ACTION PLAN FOR THIS PATIENT:        ALL QUESTIONS WERE ANSWERED/[facility] PT HAS BEEN GIVEN 2 COVID VACCINES

## 2020-02-27 ENCOUNTER — Encounter: Payer: Self-pay | Admitting: Vascular Surgery

## 2020-02-27 ENCOUNTER — Ambulatory Visit (INDEPENDENT_AMBULATORY_CARE_PROVIDER_SITE_OTHER): Payer: Medicare PPO | Admitting: Vascular Surgery

## 2020-02-27 VITALS — BP 133/77 | HR 65 | Temp 97.3°F | Resp 20 | Ht 64.5 in | Wt 153.0 lb

## 2020-02-27 DIAGNOSIS — I714 Abdominal aortic aneurysm, without rupture, unspecified: Secondary | ICD-10-CM

## 2020-02-27 DIAGNOSIS — I701 Atherosclerosis of renal artery: Secondary | ICD-10-CM | POA: Diagnosis not present

## 2020-02-27 DIAGNOSIS — I739 Peripheral vascular disease, unspecified: Secondary | ICD-10-CM | POA: Diagnosis not present

## 2020-02-27 NOTE — Progress Notes (Signed)
Patient is a 74 year old male who returns for follow-up today.  He recently underwent bilateral renal artery angiogram to rule out renal artery hypertension and other possible causes for his decline in renal function.  His angiogram showed a 50% left renal artery stenosis and no significant right renal artery stenosis.  At the time of his angiogram his aorta looked dilated and he does have a history of abdominal aortic aneurysm.  He currently has no abdominal or back pain.  He did have a hematoma in his left arm after the procedure.  He denies any numbness or tingling in his left hand.  He states that he thinks the hematoma is resolving and improving.  He still has some ecchymosis over the left arm.  He is on aspirin and a statin.  He continues to struggle with urinary outlet obstruction.  He recently had to have a Foley catheter placed at the time of his angiogram.  He has follow-up scheduled with urology.  His baseline serum creatinine is in the 1.8-2.0 range.  Review of systems: He has shortness of breath with minimal exertion.  He is able to walk but gets around with a walker and is in a wheelchair if he has to have long distances.  Past Medical History:  Diagnosis Date  . Allergy    pollen  . Arthritis   . Asthma   . Atrial fibrillation ([place])    no documentation in epic/ pt states has never been told has A Fib  . CAD (coronary artery disease)    s/p angioplasty and stenting x3 1994, pt states has this done  . Cataract    small, mild  . Chronic kidney disease    kidney cyst  . Complication of anesthesia    woke up during gall bladder sx yrs ago and with colonscopy  . Complication of anesthesia    pt thinks stopped breathing after gallbladder sx years ago, no record found  . COPD (chronic obstructive pulmonary disease) ([place])   . GERD (gastroesophageal reflux disease)    hx of  . History of kidney stones   . Hyperlipidemia   . Hypertension   . Neuromuscular disorder ([place])    nerve  issues with back   . Peripheral vascular disease ([place])   . PONV (postoperative nausea and vomiting)    none recent  . Pre-diabetes    none in last 6 months due to weight loss of 40 pounds  . PVC's (premature ventricular contractions)   . Sleep apnea    wears cpap auto set 5 to 20    Current Outpatient Medications on File Prior to Visit  Medication Sig Dispense Refill  . albuterol (PROVENTIL HFA) 108 (90 Base) MCG/ACT inhaler Inhale 1 puff into the lungs 2 (two) times daily. 8 g 5  . albuterol (PROVENTIL) (2.5 MG/3ML) 0.083% nebulizer solution USE 1 VIAL VIA NEBULIZER EVERY 6 HOURS AS NEEDED FOR WHEEZING OR SHORTNESS OF BREATH (Patient taking differently: Take 2.5 mg by nebulization every 6 (six) hours as needed for wheezing. ) 225 mL 12  . antiseptic oral rinse (BIOTENE) LIQD 15 mLs by Mouth Rinse route as needed for dry mouth.    Marland Kitchen aspirin 325 MG tablet Take 325 mg by mouth daily with supper.     Marland Kitchen atorvastatin (LIPITOR) 10 MG tablet Take 10 mg by mouth every evening.     . Azelastine-Fluticasone 137-50 MCG/ACT SUSP PLACE 1-2 SPRAYS INTO EACH NOSTRIL AT BEDTIME (Patient taking differently: Place 1-2 sprays into both  nostrils 2 (two) times daily as needed (congestion.). ) 23 g 3  . diltiazem (TIAZAC) 240 MG 24 hr capsule Take 240 mg by mouth daily.     Marland Kitchen docusate sodium (COLACE) 100 MG capsule Take 100 mg by mouth daily as needed for mild constipation.    Marland Kitchen doxycycline (VIBRA-TABS) 100 MG tablet     . esomeprazole (NEXIUM) 40 MG capsule Take 40 mg by mouth daily as needed (acid reflux/indigestion.).     Marland Kitchen finasteride (PROSCAR) 5 MG tablet Take 5 mg by mouth at bedtime.     . hydroxypropyl methylcellulose / hypromellose (ISOPTO TEARS / GONIOVISC) 2.5 % ophthalmic solution Place 1 drop into both eyes 3 (three) times daily as needed for dry eyes.    Marland Kitchen irbesartan (AVAPRO) 150 MG tablet Take 150 mg by mouth every evening.     . isosorbide mononitrate (IMDUR) 60 MG 24 hr tablet Take 60 mg by  mouth every evening.     . loperamide (IMODIUM A-D) 2 MG tablet Take 2 mg by mouth 4 (four) times daily as needed for diarrhea or loose stools.    . Magnesium 400 MG TABS Take 400 mg by mouth every other day. In the evening    . meloxicam (MOBIC) 15 MG tablet Take 15 mg by mouth daily as needed for pain.    . Menthol, Topical Analgesic, (BLUE-EMU MAXIMUM STRENGTH EX) Apply 1 application topically 3 (three) times daily as needed (pain.).    Marland Kitchen pseudoephedrine-acetaminophen (TYLENOL SINUS) 30-500 MG TABS tablet Take 1 tablet by mouth every 4 (four) hours as needed (sinus congestion).    . simethicone (MYLICON) 846 MG chewable tablet Chew 125 mg by mouth every 6 (six) hours as needed for flatulence. Takes gas x    . tamsulosin (FLOMAX) 0.4 MG CAPS capsule Take 0.4 mg by mouth at bedtime.     . Vitamin D, Ergocalciferol, (DRISDOL) 50000 UNITS CAPS Take 50,000 Units by mouth every Friday at 6 PM.      No current facility-administered medications on file prior to visit.     Physical exam:  Vitals:   02/27/20 0902  BP: 133/77  Pulse: 65  Resp: 20  Temp: (!) 97.3 F (36.3 C)  SpO2: 97%  Weight: 153 lb (69.4 kg)  Height: 5' 4.5" (1.638 m)    Left upper extremity: 1+ brachial 1+ left radial pulse diffuse ecchymosis over the left biceps area, no pulsatile mass over his puncture site near the antecubital  Data: Patient had a CT scan without contrast of his abdomen and pelvis.  This shows an aortic diameter of 3.1 cm.  Assessment: 1.  Moderate left renal artery stenosis currently not severe enough to warrant intervention 50% continue to monitor renal function and blood pressure  2.  Abdominal aortic aneurysm 3.1 cm diameter follow-up 2 years in our PA clinic with aortic ultrasound  3.  Peripheral arterial disease status post bilateral common iliac stents and femoral-femoral bypass by Dr. Kellie Simmering several years ago.  Repeat bilateral ABIs in 2 years  Ruta Hinds, MD [facility] [place] Office: [phone]

## 2020-02-28 ENCOUNTER — Other Ambulatory Visit: Payer: Self-pay | Admitting: *Deleted

## 2020-02-28 DIAGNOSIS — I714 Abdominal aortic aneurysm, without rupture, unspecified: Secondary | ICD-10-CM

## 2020-02-28 DIAGNOSIS — I739 Peripheral vascular disease, unspecified: Secondary | ICD-10-CM

## 2020-03-13 ENCOUNTER — Ambulatory Visit: Payer: Medicare PPO | Admitting: Physician Assistant

## 2020-03-17 ENCOUNTER — Other Ambulatory Visit: Payer: Self-pay | Admitting: Urology

## 2020-03-18 ENCOUNTER — Other Ambulatory Visit: Payer: Self-pay

## 2020-03-18 ENCOUNTER — Ambulatory Visit: Payer: Medicare PPO | Admitting: Physician Assistant

## 2020-03-18 ENCOUNTER — Encounter: Payer: Self-pay | Admitting: Physician Assistant

## 2020-03-18 VITALS — BP 104/50 | HR 60 | Ht 64.5 in | Wt 161.0 lb

## 2020-03-18 DIAGNOSIS — R7303 Prediabetes: Secondary | ICD-10-CM

## 2020-03-18 DIAGNOSIS — I10 Essential (primary) hypertension: Secondary | ICD-10-CM | POA: Diagnosis not present

## 2020-03-18 DIAGNOSIS — I2510 Atherosclerotic heart disease of native coronary artery without angina pectoris: Secondary | ICD-10-CM | POA: Diagnosis not present

## 2020-03-18 DIAGNOSIS — G4733 Obstructive sleep apnea (adult) (pediatric): Secondary | ICD-10-CM

## 2020-03-18 DIAGNOSIS — R600 Localized edema: Secondary | ICD-10-CM | POA: Diagnosis not present

## 2020-03-18 DIAGNOSIS — E785 Hyperlipidemia, unspecified: Secondary | ICD-10-CM

## 2020-03-18 DIAGNOSIS — I739 Peripheral vascular disease, unspecified: Secondary | ICD-10-CM

## 2020-03-18 DIAGNOSIS — N184 Chronic kidney disease, stage 4 (severe): Secondary | ICD-10-CM

## 2020-03-18 DIAGNOSIS — Z9989 Dependence on other enabling machines and devices: Secondary | ICD-10-CM

## 2020-03-18 NOTE — Patient Instructions (Addendum)
Medication Instructions:  Your physician recommends that you continue on your current medications as directed. Please refer to the Current Medication list given to you today.  *If you need a refill on your cardiac medications before your next appointment, please call your pharmacy*  Lab Work: Your physician recommends that you return for lab work TODAY:  BMET  If you have labs (blood work) drawn today and your tests are completely normal, you will receive your results only by: Marland Kitchen MyChart Message (if you have MyChart) OR . A paper copy in the mail If you have any lab test that is abnormal or we need to change your treatment, we will call you to review the results.  Testing/Procedures: NONE ordered at this time of appointment   Follow-Up: At [facility], you and your health needs are our priority.  As part of our continuing mission to provide you with exceptional heart care, we have created designated Provider Care Teams.  These Care Teams include your primary Cardiologist (physician) and Advanced Practice Providers (APPs -  Physician Assistants and Nurse Practitioners) who all work together to provide you with the care you need, when you need it.  Your next appointment:   1 month(s)  The format for your next appointment:   Virtual Visit   Provider:   Almyra Deforest, PA-C  Other Instructions  ELEVATE LEGS FOR SWELLING

## 2020-03-18 NOTE — Progress Notes (Signed)
Cardiology Office Note:    Date:  03/20/2020   ID:  Dean [name], DOB 02-28-1946, MRN [id number]  PCP:  [name], MD  Cardiologist:  Pixie Casino, MD  Electrophysiologist:  None   Referring MD: [name], MD   Chief Complaint  Patient presents with   Follow-up    seen for Dr. Debara Pickett    History of Present Illness:    Dean [name] is a 74 y.o. male with a hx of CAD, CKD IV, tobacco abuse, COPD, hypertension, hyperlipidemia, prediabetes, OSA on CPAP and PAD s/p left femorofemoral bypass for occluded distal aorta.  Myoview obtained on 12/31/2014 was negative for ischemia however did show small fixed defect which was likely artifact.  Echocardiogram obtained on 08/23/2019 showed normal LV function, mild concentric LVH, diastolic dysfunction.  There was no significant valvular abnormality.  Patient was last seen by Bunnie Domino, NP on 08/28/2019 for follow-up at which time he was doing well.  More recently, patient underwent renal artery angiography that showed approximately 50% heavily calcified left renal artery stenosis with no obvious pressure gradient, widely patent right renal artery.  He has upcoming cystoscopy with ureteroscopy and stent placement by Dr. Junious Silk of urology service.  Patient presents today for cardiology office visit.  Since last month, he has been noticing increasing lower leg edema.  He denies any orthopnea and PND.  He uses CPAP every night.  He does not do much activity due to severe dyspnea.  On physical exam, his lungs is clear however does have diminished breath sound due to COPD.  He has 2+ pitting edema in the distal leg and upper ankle area down to his feet.  The swelling is symmetrical on both sides.  He denies any recent chest pain, suspicion for ACS fairly low.  I recommended leg elevation as conservative management.  We will obtain a basic metabolic panel.  If renal function stable, I likely will consider a short course of Lasix 40 mg  daily for 3 to 5 days.  I did not start him on compression stocking because of history of significant peripheral arterial disease.  Past Medical History:  Diagnosis Date   Allergy    pollen   Arthritis    Asthma    Atrial fibrillation ([place])    no documentation in epic/ pt states has never been told has A Fib   CAD (coronary artery disease)    s/p angioplasty and stenting x3 1994, pt states has this done   Cataract    small, mild   Chronic kidney disease    kidney cyst   Complication of anesthesia    woke up during gall bladder sx yrs ago and with colonscopy   Complication of anesthesia    pt thinks stopped breathing after gallbladder sx years ago, no record found   COPD (chronic obstructive pulmonary disease) (HCC)    GERD (gastroesophageal reflux disease)    hx of   History of kidney stones    Hyperlipidemia    Hypertension    Neuromuscular disorder (HCC)    nerve issues with back    Peripheral vascular disease (HCC)    PONV (postoperative nausea and vomiting)    none recent   Pre-diabetes    none in last 6 months due to weight loss of 40 pounds   PVC's (premature ventricular contractions)    Sleep apnea    wears cpap auto set 5 to 20    Past Surgical History:  Procedure Laterality  Date   APPENDECTOMY     CHOLECYSTECTOMY  1990s   Gall Bladder   COLONOSCOPY     CORONARY ANGIOPLASTY WITH STENT PLACEMENT  1994   CYSTOSCOPY WITH URETEROSCOPY AND STENT PLACEMENT Bilateral 01/03/2020   Procedure: CYSTOSCOPY WITH RETROGRADE/ URETEROSCOPY AND RIGHT STENT PLACEMENT BLADDER BIOPSY;  Surgeon: Festus Aloe, MD;  Location: [facility];  Service: Urology;  Laterality: Bilateral;  ONLY NEEDS 30 MIN   EYE SURGERY Bilateral 2019   cataracts   lumbar disectomy  2005   L3 - 4 - 5   POLYPECTOMY     PR VEIN BYPASS GRAFT,AORTO-FEM-POP  08/31/11   Right to Left Fem-Fem   RENAL ANGIOGRAPHY N/A 02/14/2020   Procedure: RENAL ANGIOGRAPHY;   Surgeon: Elam Dutch, MD;  Location: [facility];  Service: Cardiovascular;  Laterality: N/A;  Bilateral   TONSILLECTOMY     UMBILICAL HERNIA REPAIR      Current Medications: Current Meds  Medication Sig   albuterol (PROVENTIL HFA) 108 (90 Base) MCG/ACT inhaler Inhale 1 puff into the lungs 2 (two) times daily.   albuterol (PROVENTIL) (2.5 MG/3ML) 0.083% nebulizer solution USE 1 VIAL VIA NEBULIZER EVERY 6 HOURS AS NEEDED FOR WHEEZING OR SHORTNESS OF BREATH (Patient taking differently: Take 2.5 mg by nebulization every 6 (six) hours as needed for wheezing. )   antiseptic oral rinse (BIOTENE) LIQD 15 mLs by Mouth Rinse route as needed for dry mouth.   aspirin 325 MG tablet Take 325 mg by mouth daily with supper.    atorvastatin (LIPITOR) 10 MG tablet Take 10 mg by mouth every evening.    Azelastine-Fluticasone 137-50 MCG/ACT SUSP PLACE 1-2 SPRAYS INTO EACH NOSTRIL AT BEDTIME (Patient taking differently: Place 1-2 sprays into both nostrils 2 (two) times daily as needed (congestion.). )   diltiazem (TIAZAC) 240 MG 24 hr capsule Take 240 mg by mouth daily.    docusate sodium (COLACE) 100 MG capsule Take 100 mg by mouth daily as needed for mild constipation.   doxycycline (VIBRA-TABS) 100 MG tablet    esomeprazole (NEXIUM) 40 MG capsule Take 40 mg by mouth daily as needed (acid reflux/indigestion.).    finasteride (PROSCAR) 5 MG tablet Take 5 mg by mouth at bedtime.    hydroxypropyl methylcellulose / hypromellose (ISOPTO TEARS / GONIOVISC) 2.5 % ophthalmic solution Place 1 drop into both eyes 3 (three) times daily as needed for dry eyes.   irbesartan (AVAPRO) 150 MG tablet Take 150 mg by mouth every evening.    isosorbide mononitrate (IMDUR) 60 MG 24 hr tablet Take 60 mg by mouth every evening.    loperamide (IMODIUM A-D) 2 MG tablet Take 2 mg by mouth 4 (four) times daily as needed for diarrhea or loose stools.   Magnesium 400 MG TABS Take 400 mg by mouth every other  day. In the evening   meloxicam (MOBIC) 15 MG tablet Take 15 mg by mouth daily as needed for pain.   Menthol, Topical Analgesic, (BLUE-EMU MAXIMUM STRENGTH EX) Apply 1 application topically 3 (three) times daily as needed (pain.).   pseudoephedrine-acetaminophen (TYLENOL SINUS) 30-500 MG TABS tablet Take 1 tablet by mouth every 4 (four) hours as needed (sinus congestion).   simethicone (MYLICON) 161 MG chewable tablet Chew 125 mg by mouth every 6 (six) hours as needed for flatulence. Takes gas x   tamsulosin (FLOMAX) 0.4 MG CAPS capsule Take 0.4 mg by mouth at bedtime.    Vitamin D, Ergocalciferol, (DRISDOL) 50000 UNITS CAPS Take 50,000  Units by mouth every Friday at 6 PM.      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  Tobacco Use   Smoking status: Current Every Day Smoker    Packs/day: 1.00    Years: 51.00    Pack years: 51.00    Types: Cigarettes   Smokeless tobacco: Never Used  Substance and Sexual Activity   Alcohol use: Not Currently    Alcohol/week: 0.0 standard drinks   Drug use: No   Sexual activity: Not on file  Other Topics Concern   Not on file  Social History Narrative   Travelled to [place] and [place] 2008, [place] 2009   Social Determinants of Health   Financial Resource Strain:    Difficulty of Paying Living Expenses:   Food Insecurity:    Worried About Charity fundraiser in the Last Year:    Arboriculturist in the Last Year:   Transportation Needs:    Film/video editor (Medical):    Lack of Transportation (Non-Medical):   Physical Activity:    Days of Exercise per Week:    Minutes of Exercise per Session:   Stress:      Feeling of Stress :   Social Connections:    Frequency of Communication with Friends and Family:    Frequency of Social Gatherings with Friends and Family:    Attends Religious Services:    Active Member of Clubs or Organizations:    Attends Music therapist:    Marital Status:      Family History: The patient's family history includes Breast cancer in his sister; Cancer in his mother and sister; Diabetes in his father; Hyperlipidemia in his brother, mother, and sister; Hypertension in his brother, father, mother, and sister; Kidney cancer in his mother; Other in his father; Prostate cancer in his brother. There is no history of Colon cancer, Colon polyps, Esophageal cancer, Rectal cancer, or Stomach cancer.  ROS:   Please see the history of present illness.     All other systems reviewed and are negative.  EKGs/Labs/Other Studies Reviewed:    The following studies were reviewed today:  Echo 08/23/2019 1. The left ventricle has hyperdynamic systolic function, with an  ejection fraction of >65%. The cavity size was normal. There is mild  concentric left ventricular hypertrophy. Left ventricular diastolic  Doppler parameters are consistent with impaired  relaxation.  2. The right ventricle has normal systolic function. The cavity was  normal. There is no increase in right ventricular wall thickness. Right  ventricular systolic pressure could not be assessed.  3. The aortic valve is tricuspid. Mild thickening of the aortic valve.  Mild calcification of the aortic valve.  4. The aorta is normal unless otherwise noted.   EKG:  EKG is not ordered today.    Recent Labs: 08/14/2019: Magnesium 1.8 11/10/2019: Platelets 292 02/14/2020: Hemoglobin 13.6 03/18/2020: BUN 21; Creatinine, Ser 1.71; Potassium 4.7; Sodium 143  Recent Lipid Panel No results found for: CHOL, TRIG, HDL, CHOLHDL, VLDL, LDLCALC, LDLDIRECT  Physical Exam:    VS:  BP (!) 104/50    Pulse 60  Ht 5' 4.5" (1.638 m)    Wt 161 lb (73 kg)    SpO2 91%    BMI 27.21 kg/m     Wt Readings from Last 3 Encounters:  03/18/20 161 lb (73 kg)  02/27/20 153 lb (69.4 kg)  02/17/20 153 lb (69.4 kg)     GEN:  Well nourished, well developed in no acute distress HEENT: Normal NECK: No JVD; No carotid bruits LYMPHATICS: No lymphadenopathy CARDIAC: RRR, no murmurs, rubs, gallops RESPIRATORY:  Clear to auscultation without rales, wheezing or rhonchi  ABDOMEN: Soft, non-tender, non-distended MUSCULOSKELETAL:  1-2+ edema; No deformity  SKIN: Warm and dry NEUROLOGIC:  Alert and oriented x 3 PSYCHIATRIC:  Normal affect   ASSESSMENT:    1. Leg edema   2. Coronary artery disease involving native coronary artery of native heart without angina pectoris   3. Essential hypertension   4. Hyperlipidemia LDL goal <70   5. Prediabetes   6. OSA on CPAP   7. PAD (peripheral artery disease) ([place])   8. Chronic kidney disease (CKD), stage IV (severe) (HCC)    PLAN:    In order of problems listed above:  1. Leg edema: He has 2+ pitting edema in bilateral lower extremity.  I will obtain a basic metabolic panel today and if renal function is stable, I will consider at 40 mg daily of Lasix for 3 to 5 days.  During the meantime he will continue with his leg elevation.  I did not place him on compression stocking due to prior history of severe arterial disease  2. CAD: Denies any recent chest pain.  On aspirin and Lipitor  3. Hypertension: Blood pressure stable on current therapy.  4. Hyperlipidemia: On Lipitor  5. Prediabetes: Followed by primary care provider  6. Obstructive sleep apnea: On CPAP therapy  7. PAD: Recent renal angiography revealed nonobstructive disease.  He also has known history of lower extremity peripheral arterial disease  8. CKD stage IV: Obtain basic metabolic panel.  I am hesitant to be too aggressive when comes to diuresis given concern for renal function.   Medication  Adjustments/Labs and Tests Ordered: Current medicines are reviewed at length with the patient today.  Concerns regarding medicines are outlined above.  Orders Placed This Encounter  Procedures   Basic metabolic panel   No orders of the defined types were placed in this encounter.   Patient Instructions  Medication Instructions:  Your physician recommends that you continue on your current medications as directed. Please refer to the Current Medication list given to you today.  *If you need a refill on your cardiac medications before your next appointment, please call your pharmacy*  Lab Work: Your physician recommends that you return for lab work TODAY:  BMET  If you have labs (blood work) drawn today and your tests are completely normal, you will receive your results only by:  [place] (if you have MyChart) OR  A paper copy in the mail If you have any lab test that is abnormal or we need to change your treatment, we will call you to review the results.  Testing/Procedures: NONE ordered at this time of appointment   Follow-Up: At [facility], you and your health needs are our priority.  As part of our continuing mission to provide you with exceptional heart care, we have created designated Provider Care Teams.  These Care Teams include your primary Cardiologist (physician) and Advanced Practice Providers (APPs -  Physician Assistants and Nurse Practitioners) who  all work together to provide you with the care you need, when you need it.  Your next appointment:   1 month(s)  The format for your next appointment:   Virtual Visit   Provider:   Almyra Deforest, PA-C  Other Instructions  ELEVATE LEGS FOR SWELLING     Signed, Almyra Deforest, [place]  03/20/2020 11:05 PM    [place]

## 2020-03-19 ENCOUNTER — Other Ambulatory Visit: Payer: Self-pay

## 2020-03-19 ENCOUNTER — Other Ambulatory Visit: Payer: Self-pay | Admitting: Urology

## 2020-03-19 LAB — BASIC METABOLIC PANEL
BUN/Creatinine Ratio: 12 (ref 10–24)
BUN: 21 mg/dL (ref 8–27)
CO2: 28 mmol/L (ref 20–29)
Calcium: 10.3 mg/dL — ABNORMAL HIGH (ref 8.6–10.2)
Chloride: 101 mmol/L (ref 96–106)
Creatinine, Ser: 1.71 mg/dL — ABNORMAL HIGH (ref 0.76–1.27)
GFR calc Af Amer: 45 mL/min/{1.73_m2} — ABNORMAL LOW (ref >59)
GFR calc non Af Amer: 39 mL/min/{1.73_m2} — ABNORMAL LOW (ref >59)
Glucose: 72 mg/dL (ref 65–99)
Potassium: 4.7 mmol/L (ref 3.5–5.2)
Sodium: 143 mmol/L (ref 134–144)

## 2020-03-19 MED ORDER — POTASSIUM CHLORIDE CRYS ER 20 MEQ PO TBCR: 20.0000 meq | EXTENDED_RELEASE_TABLET | Freq: Every day | ORAL | 0 refills | Status: DC

## 2020-03-19 MED ORDER — FUROSEMIDE 40 MG PO TABS: 40.0000 mg | ORAL_TABLET | Freq: Every day | ORAL | 0 refills | Status: DC

## 2020-03-19 NOTE — Progress Notes (Signed)
Renal function improved slightly from a month ago, I would like to proceed with short course of lasix 40mg  for 3 days, then stop.

## 2020-03-20 ENCOUNTER — Telehealth: Payer: Self-pay | Admitting: Physician Assistant

## 2020-03-20 ENCOUNTER — Encounter: Payer: Self-pay | Admitting: Physician Assistant

## 2020-03-20 NOTE — Telephone Encounter (Signed)
   Pt c/o medication issue:  1. Name of Medication: atorvastatin (LIPITOR) 10 MG tablet  2. How are you currently taking this medication (dosage and times per day)? Take 10 mg by mouth every evening  3. Are you having a reaction (difficulty breathing--STAT)?   4. What is your medication issue? Pt would like to know if he needs to stop his atorvastatin since he was prescribed lasix and potasium.  Please advise

## 2020-03-20 NOTE — Telephone Encounter (Signed)
Contacted patient- he states he just wanted to know if he should continue his Irbesartan medication.  I advised I did not see anything in the chart for him to stop the Irbesartan medication from last note with Almyra Deforest, PA- or in lab work.  Patient advised. Verbalized understanding.

## 2020-04-10 ENCOUNTER — Other Ambulatory Visit (HOSPITAL_COMMUNITY)
Admission: RE | Admit: 2020-04-10 | Discharge: 2020-04-10 | Disposition: A | Discharge status: Home / Self Care | Payer: Medicare PPO | Source: Ambulatory Visit | Attending: Urology | Admitting: Urology

## 2020-04-10 DIAGNOSIS — Z20822 Contact with and (suspected) exposure to covid-19: Secondary | ICD-10-CM | POA: Diagnosis not present

## 2020-04-10 DIAGNOSIS — Z01812 Encounter for preprocedural laboratory examination: Secondary | ICD-10-CM | POA: Diagnosis not present

## 2020-04-10 LAB — SARS CORONAVIRUS 2 (TAT 6-24 HRS): SARS Coronavirus 2: NEGATIVE

## 2020-04-13 ENCOUNTER — Encounter (HOSPITAL_BASED_OUTPATIENT_CLINIC_OR_DEPARTMENT_OTHER): Payer: Self-pay | Admitting: Urology

## 2020-04-13 ENCOUNTER — Other Ambulatory Visit: Payer: Self-pay

## 2020-04-13 NOTE — Progress Notes (Addendum)
Addendum: spoke with [place] zanetto pa ok to prceed barring acute status changes.  Spoke w/ via phone for pre-op interview---patient Lab needs dos---- I stat 8              COVID test ------04-10-2020 Arrive at -------730 am 04-14-2020 NPO after ------midnight Medications to take morning of surgery -----albuterol inhaler , albuterol nebulizer, diltiazem, nexium Diabetic medication -----n/a Patient Special Instructions -----bring cpap mask tubing and machine Pre-Op special Istructions ----- Patient verbalized understanding of instructions that were given at this phone interview. Patient denies shortness of breath, chest pain, fever, cough a this phone interview.  Anesthesia : had surgery 01-03-2020 [facility], cardiac history, patient stated he stopped breathing with cholecystectomy many yrs ago, but no records could be found( patient thinks surgery done at [facility]),   PCP:dr richard aronson Cardiologist : dr Clayton Lefort heo meng pa 03-18-2020 epic Chest x-ray : 05-06-2019 epic EKG :08-14-2019 epic Echo :08-23-2019 epic Cardiac Cath : 1994  Stress test 12-31-2014 epic Peri vascular catherization 02-14-2020 epic lov vascular dr fields 02-27-2020 epic Sleep Study/ CPAP : 4 yrs ago home study cpap auto set 5 to 20 Fasting Blood Sugar :      / Checks Blood Sugar -- times a day:  n/a Blood Thinner/ Instructions /Last Dose:n/a ASA / Instructions/ Last Dose : dr Junious Silk instructed pt to stay on 325 mg aspirin, patient decided to stop 325 mg aspirin last dose 04-09-2020  Patient denies shortness of breath, chest pain, fever, and cough at this phone interview.

## 2020-04-13 NOTE — Progress Notes (Signed)
Patient unable to change arrival time to 700 am, vickie to let kila know.

## 2020-04-14 ENCOUNTER — Ambulatory Visit (HOSPITAL_BASED_OUTPATIENT_CLINIC_OR_DEPARTMENT_OTHER): Payer: Medicare PPO | Admitting: Physician Assistant

## 2020-04-14 ENCOUNTER — Other Ambulatory Visit: Payer: Self-pay

## 2020-04-14 ENCOUNTER — Encounter (HOSPITAL_BASED_OUTPATIENT_CLINIC_OR_DEPARTMENT_OTHER)
Admission: RE | Disposition: A | Discharge status: Home / Self Care | Payer: Self-pay | Source: Home / Self Care | Attending: Urology

## 2020-04-14 ENCOUNTER — Ambulatory Visit (HOSPITAL_BASED_OUTPATIENT_CLINIC_OR_DEPARTMENT_OTHER)
Admission: RE | Admit: 2020-04-14 | Discharge: 2020-04-14 | Disposition: A | Discharge status: Home / Self Care | Payer: Medicare PPO | Attending: Urology | Admitting: Urology

## 2020-04-14 ENCOUNTER — Encounter (HOSPITAL_BASED_OUTPATIENT_CLINIC_OR_DEPARTMENT_OTHER): Payer: Self-pay | Admitting: Urology

## 2020-04-14 DIAGNOSIS — R350 Frequency of micturition: Secondary | ICD-10-CM | POA: Insufficient documentation

## 2020-04-14 DIAGNOSIS — I739 Peripheral vascular disease, unspecified: Secondary | ICD-10-CM | POA: Diagnosis not present

## 2020-04-14 DIAGNOSIS — N131 Hydronephrosis with ureteral stricture, not elsewhere classified: Secondary | ICD-10-CM | POA: Diagnosis not present

## 2020-04-14 DIAGNOSIS — Z7982 Long term (current) use of aspirin: Secondary | ICD-10-CM | POA: Insufficient documentation

## 2020-04-14 DIAGNOSIS — N189 Chronic kidney disease, unspecified: Secondary | ICD-10-CM | POA: Insufficient documentation

## 2020-04-14 DIAGNOSIS — Z803 Family history of malignant neoplasm of breast: Secondary | ICD-10-CM | POA: Insufficient documentation

## 2020-04-14 DIAGNOSIS — R7303 Prediabetes: Secondary | ICD-10-CM | POA: Diagnosis not present

## 2020-04-14 DIAGNOSIS — N401 Enlarged prostate with lower urinary tract symptoms: Secondary | ICD-10-CM

## 2020-04-14 DIAGNOSIS — Z791 Long term (current) use of non-steroidal anti-inflammatories (NSAID): Secondary | ICD-10-CM | POA: Diagnosis not present

## 2020-04-14 DIAGNOSIS — I2510 Atherosclerotic heart disease of native coronary artery without angina pectoris: Secondary | ICD-10-CM | POA: Insufficient documentation

## 2020-04-14 DIAGNOSIS — M1990 Unspecified osteoarthritis, unspecified site: Secondary | ICD-10-CM | POA: Insufficient documentation

## 2020-04-14 DIAGNOSIS — N135 Crossing vessel and stricture of ureter without hydronephrosis: Secondary | ICD-10-CM | POA: Diagnosis not present

## 2020-04-14 DIAGNOSIS — G4730 Sleep apnea, unspecified: Secondary | ICD-10-CM | POA: Diagnosis not present

## 2020-04-14 DIAGNOSIS — Z91013 Allergy to seafood: Secondary | ICD-10-CM | POA: Insufficient documentation

## 2020-04-14 DIAGNOSIS — K219 Gastro-esophageal reflux disease without esophagitis: Secondary | ICD-10-CM | POA: Insufficient documentation

## 2020-04-14 DIAGNOSIS — J449 Chronic obstructive pulmonary disease, unspecified: Secondary | ICD-10-CM | POA: Insufficient documentation

## 2020-04-14 DIAGNOSIS — Z9049 Acquired absence of other specified parts of digestive tract: Secondary | ICD-10-CM | POA: Insufficient documentation

## 2020-04-14 DIAGNOSIS — Z86010 Personal history of colonic polyps: Secondary | ICD-10-CM | POA: Insufficient documentation

## 2020-04-14 DIAGNOSIS — I129 Hypertensive chronic kidney disease with stage 1 through stage 4 chronic kidney disease, or unspecified chronic kidney disease: Secondary | ICD-10-CM | POA: Diagnosis not present

## 2020-04-14 DIAGNOSIS — E785 Hyperlipidemia, unspecified: Secondary | ICD-10-CM | POA: Diagnosis not present

## 2020-04-14 DIAGNOSIS — I4891 Unspecified atrial fibrillation: Secondary | ICD-10-CM | POA: Insufficient documentation

## 2020-04-14 DIAGNOSIS — I714 Abdominal aortic aneurysm, without rupture: Secondary | ICD-10-CM | POA: Diagnosis not present

## 2020-04-14 DIAGNOSIS — Z87442 Personal history of urinary calculi: Secondary | ICD-10-CM | POA: Insufficient documentation

## 2020-04-14 DIAGNOSIS — Z8042 Family history of malignant neoplasm of prostate: Secondary | ICD-10-CM | POA: Insufficient documentation

## 2020-04-14 DIAGNOSIS — Z8051 Family history of malignant neoplasm of kidney: Secondary | ICD-10-CM | POA: Insufficient documentation

## 2020-04-14 DIAGNOSIS — Z91048 Other nonmedicinal substance allergy status: Secondary | ICD-10-CM | POA: Insufficient documentation

## 2020-04-14 DIAGNOSIS — Z8249 Family history of ischemic heart disease and other diseases of the circulatory system: Secondary | ICD-10-CM | POA: Insufficient documentation

## 2020-04-14 DIAGNOSIS — Z8349 Family history of other endocrine, nutritional and metabolic diseases: Secondary | ICD-10-CM | POA: Insufficient documentation

## 2020-04-14 DIAGNOSIS — F17210 Nicotine dependence, cigarettes, uncomplicated: Secondary | ICD-10-CM | POA: Diagnosis not present

## 2020-04-14 DIAGNOSIS — Z79899 Other long term (current) drug therapy: Secondary | ICD-10-CM | POA: Diagnosis not present

## 2020-04-14 DIAGNOSIS — Z955 Presence of coronary angioplasty implant and graft: Secondary | ICD-10-CM | POA: Diagnosis not present

## 2020-04-14 DIAGNOSIS — Z466 Encounter for fitting and adjustment of urinary device: Secondary | ICD-10-CM | POA: Diagnosis not present

## 2020-04-14 DIAGNOSIS — R3911 Hesitancy of micturition: Secondary | ICD-10-CM | POA: Diagnosis not present

## 2020-04-14 DIAGNOSIS — Z880 Allergy status to penicillin: Secondary | ICD-10-CM | POA: Insufficient documentation

## 2020-04-14 DIAGNOSIS — Z833 Family history of diabetes mellitus: Secondary | ICD-10-CM | POA: Insufficient documentation

## 2020-04-14 DIAGNOSIS — Z811 Family history of alcohol abuse and dependence: Secondary | ICD-10-CM | POA: Insufficient documentation

## 2020-04-14 HISTORY — DX: Abdominal aortic aneurysm, without rupture, unspecified: I71.40

## 2020-04-14 HISTORY — PX: CYSTOSCOPY WITH INSERTION OF UROLIFT: SHX6678

## 2020-04-14 HISTORY — DX: Abdominal aortic aneurysm, without rupture: I71.4

## 2020-04-14 HISTORY — DX: Localized edema: R60.0

## 2020-04-14 HISTORY — PX: CYSTOSCOPY WITH URETEROSCOPY AND STENT PLACEMENT: SHX6377

## 2020-04-14 HISTORY — DX: Personal history of other specified conditions: Z87.898

## 2020-04-14 LAB — POCT I-STAT, CHEM 8
BUN: 24 mg/dL — ABNORMAL HIGH (ref 8–23)
Calcium, Ion: 1.27 mmol/L (ref 1.15–1.40)
Chloride: 99 mmol/L (ref 98–111)
Creatinine, Ser: 1.8 mg/dL — ABNORMAL HIGH (ref 0.61–1.24)
Glucose, Bld: 86 mg/dL (ref 70–99)
HCT: 41 % (ref 39.0–52.0)
Hemoglobin: 13.9 g/dL (ref 13.0–17.0)
Potassium: 3.8 mmol/L (ref 3.5–5.1)
Sodium: 140 mmol/L (ref 135–145)
TCO2: 34 mmol/L — ABNORMAL HIGH (ref 22–32)

## 2020-04-14 SURGERY — CYSTOURETEROSCOPY, WITH STENT INSERTION
Anesthesia: General | Site: Renal | Laterality: Right

## 2020-04-14 MED ORDER — TRAMADOL HCL 50 MG PO TABS
ORAL_TABLET | ORAL | Status: AC
  Filled 2020-04-14: qty 1

## 2020-04-14 MED ORDER — EPHEDRINE SULFATE-NACL 50-0.9 MG/10ML-% IV SOSY
PREFILLED_SYRINGE | INTRAVENOUS | Status: DC | PRN
  Administered 2020-04-14: 10 mg via INTRAVENOUS

## 2020-04-14 MED ORDER — FENTANYL CITRATE (PF) 100 MCG/2ML IJ SOLN
INTRAMUSCULAR | Status: AC
  Filled 2020-04-14: qty 2

## 2020-04-14 MED ORDER — FENTANYL CITRATE (PF) 100 MCG/2ML IJ SOLN: 25.0000 ug | INTRAMUSCULAR | Status: DC | PRN

## 2020-04-14 MED ORDER — OXYCODONE HCL 5 MG/5ML PO SOLN: 5.0000 mg | Freq: Once | ORAL | Status: DC | PRN

## 2020-04-14 MED ORDER — SODIUM CHLORIDE 0.9 % IV SOLN: INTRAVENOUS | Status: DC

## 2020-04-14 MED ORDER — EPHEDRINE 5 MG/ML INJ
INTRAVENOUS | Status: AC
  Filled 2020-04-14: qty 10

## 2020-04-14 MED ORDER — CIPROFLOXACIN IN D5W 400 MG/200ML IV SOLN
400.0000 mg | Freq: Once | INTRAVENOUS | Status: AC
  Administered 2020-04-14: 400 mg via INTRAVENOUS

## 2020-04-14 MED ORDER — ACETAMINOPHEN 325 MG PO TABS: 325.0000 mg | ORAL_TABLET | ORAL | Status: DC | PRN

## 2020-04-14 MED ORDER — PROPOFOL 10 MG/ML IV BOLUS
INTRAVENOUS | Status: DC | PRN
  Administered 2020-04-14: 120 mg via INTRAVENOUS

## 2020-04-14 MED ORDER — LIDOCAINE 2% (20 MG/ML) 5 ML SYRINGE
INTRAMUSCULAR | Status: AC
  Filled 2020-04-14: qty 5

## 2020-04-14 MED ORDER — IOHEXOL 300 MG/ML  SOLN
INTRAMUSCULAR | Status: DC | PRN
  Administered 2020-04-14: 20 mL

## 2020-04-14 MED ORDER — SODIUM CHLORIDE 0.9 % IR SOLN
Status: DC | PRN
  Administered 2020-04-14: 3000 mL

## 2020-04-14 MED ORDER — ONDANSETRON HCL 4 MG/2ML IJ SOLN: 4.0000 mg | Freq: Once | INTRAMUSCULAR | Status: DC | PRN

## 2020-04-14 MED ORDER — ACETAMINOPHEN 160 MG/5ML PO SOLN: 325.0000 mg | ORAL | Status: DC | PRN

## 2020-04-14 MED ORDER — OXYCODONE HCL 5 MG PO TABS: 5.0000 mg | ORAL_TABLET | Freq: Once | ORAL | Status: DC | PRN

## 2020-04-14 MED ORDER — ACETAMINOPHEN 10 MG/ML IV SOLN: 1000.0000 mg | Freq: Once | INTRAVENOUS | Status: DC | PRN

## 2020-04-14 MED ORDER — FENTANYL CITRATE (PF) 100 MCG/2ML IJ SOLN
INTRAMUSCULAR | Status: DC | PRN
  Administered 2020-04-14: 25 ug via INTRAVENOUS

## 2020-04-14 MED ORDER — PROPOFOL 10 MG/ML IV BOLUS
INTRAVENOUS | Status: AC
  Filled 2020-04-14: qty 40

## 2020-04-14 MED ORDER — ONDANSETRON HCL 4 MG/2ML IJ SOLN
INTRAMUSCULAR | Status: DC | PRN
  Administered 2020-04-14: 4 mg via INTRAVENOUS

## 2020-04-14 MED ORDER — KETOROLAC TROMETHAMINE 15 MG/ML IJ SOLN
INTRAMUSCULAR | Status: DC | PRN
  Administered 2020-04-14: 15 mg via INTRAVENOUS

## 2020-04-14 MED ORDER — CIPROFLOXACIN IN D5W 400 MG/200ML IV SOLN
INTRAVENOUS | Status: AC
  Filled 2020-04-14: qty 200

## 2020-04-14 MED ORDER — MEPERIDINE HCL 25 MG/ML IJ SOLN: 6.2500 mg | INTRAMUSCULAR | Status: DC | PRN

## 2020-04-14 MED ORDER — TRAMADOL HCL 50 MG PO TABS
50.0000 mg | ORAL_TABLET | Freq: Once | ORAL | Status: AC
  Administered 2020-04-14: 50 mg via ORAL

## 2020-04-14 MED ORDER — LIDOCAINE 2% (20 MG/ML) 5 ML SYRINGE
INTRAMUSCULAR | Status: DC | PRN
  Administered 2020-04-14: 100 mg via INTRAVENOUS

## 2020-04-14 MED ORDER — ONDANSETRON HCL 4 MG/2ML IJ SOLN
INTRAMUSCULAR | Status: AC
  Filled 2020-04-14: qty 2

## 2020-04-14 SURGICAL SUPPLY — 40 items
BAG DRAIN URO-CYSTO SKYTR STRL (DRAIN) ×4 IMPLANT
BAG DRN RND TRDRP ANRFLXCHMBR (UROLOGICAL SUPPLIES)
BAG DRN UROCATH (DRAIN) ×2
BAG URINE DRAIN 2000ML AR STRL (UROLOGICAL SUPPLIES) IMPLANT
BAG URINE LEG 500ML (DRAIN) IMPLANT
CATH COUDE FOLEY 2W 5CC 18FR (CATHETERS) IMPLANT
CATH FOLEY 2WAY SLVR  5CC 16FR (CATHETERS)
CATH FOLEY 2WAY SLVR  5CC 18FR (CATHETERS)
CATH FOLEY 2WAY SLVR 5CC 16FR (CATHETERS) IMPLANT
CATH FOLEY 2WAY SLVR 5CC 18FR (CATHETERS) IMPLANT
CATH URET 5FR 28IN CONE TIP (BALLOONS)
CATH URET 5FR 28IN OPEN ENDED (CATHETERS) IMPLANT
CATH URET 5FR 70CM CONE TIP (BALLOONS) IMPLANT
CATH URET DUAL LUMEN 6-10FR 50 (CATHETERS) IMPLANT
CLOTH BEACON ORANGE TIMEOUT ST (SAFETY) ×4 IMPLANT
ELECT REM PT RETURN 9FT ADLT (ELECTROSURGICAL)
ELECTRODE REM PT RTRN 9FT ADLT (ELECTROSURGICAL) ×2 IMPLANT
GLOVE BIO SURGEON STRL SZ7.5 (GLOVE) ×4 IMPLANT
GLOVE BIO SURGEON STRL SZ8 (GLOVE) IMPLANT
GLOVE BIOGEL PI IND STRL 8.5 (GLOVE) IMPLANT
GLOVE BIOGEL PI INDICATOR 8.5 (GLOVE) ×4
GOWN STRL REUS W/ TWL XL LVL3 (GOWN DISPOSABLE) ×2 IMPLANT
GOWN STRL REUS W/TWL LRG LVL3 (GOWN DISPOSABLE) ×4 IMPLANT
GOWN STRL REUS W/TWL XL LVL3 (GOWN DISPOSABLE) ×8
GUIDEWIRE ANG ZIPWIRE 038X150 (WIRE) ×2 IMPLANT
GUIDEWIRE STR DUAL SENSOR (WIRE) ×4 IMPLANT
GUIDEWIRE ZIPWRE .038 STRAIGHT (WIRE) IMPLANT
IV NS IRRIG 3000ML ARTHROMATIC (IV SOLUTION) ×6 IMPLANT
KIT TURNOVER CYSTO (KITS) ×4 IMPLANT
MANIFOLD NEPTUNE II (INSTRUMENTS) ×4 IMPLANT
NEEDLE HYPO 22GX1.5 SAFETY (NEEDLE) IMPLANT
NS IRRIG 500ML POUR BTL (IV SOLUTION) ×4 IMPLANT
SHEATH URET ACCESS 12FR/35CM (UROLOGICAL SUPPLIES) ×2 IMPLANT
STENT URET 6FRX24 CONTOUR (STENTS) ×2 IMPLANT
SYSTEM UROLIFT (Male Continence) ×8 IMPLANT
TRAY CYSTO PACK (CUSTOM PROCEDURE TRAY) ×4 IMPLANT
TUBE CONNECTING 12'X1/4 (SUCTIONS) ×1
TUBE CONNECTING 12X1/4 (SUCTIONS) ×3 IMPLANT
TUBING UROLOGY SET (TUBING) ×4 IMPLANT
WATER STERILE IRR 3000ML UROMA (IV SOLUTION) ×2 IMPLANT

## 2020-04-14 NOTE — Op Note (Signed)
Preoperative diagnosis: Right UPJ obstruction, BPH with urinary hesitancy, frequency Postoperative diagnosis: Same  Procedure: Cystoscopy, right diagnostic ureteroscopy, right retrograde pyelogram, right stent exchange, UroLift prostatic urethral lift x4 implants  Surgeon: Junious Silk  Anesthesia: General  Indication for procedure: Mr. Bail is a 74 year old male with a history of right UPJ obstruction and he was stented January 2021.  Visualization of the collecting system and UPJ was suboptimal and he was brought today for stent change and repeat ureteroscopy to plan long-term management.  Also he has had hesitancy and frequency and we will proceed with UroLift.  Findings: On cystoscopy there was trilobar hypertrophy with obstruction of the prostatic urethra from lateral lobes.  He also had a small median lobe but once the lateral lobes were moved out of the way with the UroLift the prostatic urethra was patent with a good channel.  The median lobe and bladder neck did not need to be treated.  The right stent was in place, but already getting some crust on the stent.  I would not leave it much longer than 3 months at a time from what I saw today.  It was a couple inches too long, and I downsized from a 26 to 24 cm and this might leave the left stent hanging out in the bladder.  Right ureteroscopy revealed a flap of renal pelvis obstructing the UPJ with the ureter suboptimally placed more lateral insertion into the renal pelvis kinking the UPJ.  The UPJ both retrograde and antegrade appeared normal with smooth mucosa without lesion.  Collecting system also smooth and normal without lesion.  Right retrograde pyelogram-again this outlined a mildly dilated collecting system and renal pelvis with no passage of contrast into the ureter from the collecting system despite a wire and the scope being across the UPJ.  Once the catheter was brought down into the ureter this revealed the ureter inserted into the  renal pelvis a centimeter or 2 to lateral and again no passage of contrast up into the collecting system.  However, there were no filling defects or worrisome findings.  Description of procedure: After consent was obtained patient brought to the operating room.  After adequate anesthesia he was placed lithotomy position and prepped and draped in the usual sterile fashion.  A timeout was performed to confirm the patient and procedure.  The bladder was inspected with the cystoscope and the right stent grasped and removed through the urethral meatus and a sensor wire advanced and coiled in the collecting system.  I then took a semirigid scope adjacent to the wire to inspect the ureter and noted it to be normal.  There was a flap of mucosa blocking off the UPJ and the wire could be seen coursing around it looking antegrade through the semirigid.  I passed a second wire under direct vision and backed the scope out.  I then passed a ureteral access sheath without difficulty and then the single channel digital ureteroscope was passed into the proximal ureter.  I was able to work this into the collecting system and inspected the collecting system which was normal.  I was able to get a retroflexed view at the UPJ and again smooth mucosal tissue was obstructing the UPJ.  I then evacuated any irrigation from the collecting system and shot a retrograde pyelogram through the ureteroscope and then pulled that out into the proximal ureter and continued to inject contrast to get a good picture of the anatomy.  The access sheath was backed out on  the ureteroscope and the scope backed out inspecting again the collecting system UPJ and ureter and noted there to be no injury.  Wire was backloaded on the cystoscope and a 624 cm stent passed.  Wire was removed with a good coil seen in the kidney and a good coil in the bladder.  The prostate and the bladder were carefully inspected again with the cystoscope and then I switched out to  the 39F cystoscope was inserted into the bladder. The cystoscopy bridge was replaced with a UroLift delivery device.The first treatment site was the patient's left side side approximately 1.5cm distal to the bladder neck. The distal tip of the delivery device was then angled laterally and brought anteriorly approximately 20 degrees at this position to compress the lateral lobe. The trigger was pulled, thereby deploying a needle containing the implant through the prostate. The needle was then retracted, allowing one end of the implant to be delivered to the capsular surface of the prostate. The implant was then tensioned to assure capsular seating and removal of slack monofilament. The device was then angled back toward midline and slowly advanced proximally until cystoscopic verification of the monofilament being centered in the delivery bay. The urethral end piece was then affixed to the monofilament thereby tailoring the size of the implant. Excess filament was then severed. The delivery device was then re-advanced into the bladder. The delivery device was then replaced with cystoscope and bridge and the implant location and opening effect was confirmed cystoscopically. The same procedure was then repeated on the right side, and two additional implants were delivered just proximal to the verumontanum, again one on left and one on the right side of the prostate, following the same technique. A final cystoscopy was conducted first to inspect the location and state of each implant and second, to confirm the presence of a continuous anterior channel was present through the prostatic urethra with irrigation flow turned off. Four Implants were delivered in total.   Following this, the scope was removed and a foley catheter was not placed. He was then awakened and taken to the recovery area in stable condition. He tolerated the procedure well.  Complications: None  Blood loss: Minimal  Symptoms: None  Drains:  None  Disposition: Patient stable to PACU

## 2020-04-14 NOTE — Discharge Instructions (Signed)
Ureteral Stent Implantation, Care After This sheet gives you information about how to care for yourself after your procedure. Your health care provider may also give you more specific instructions. If you have problems or questions, contact your health care provider. What can I expect after the procedure? After the procedure, it is common to have:  Nausea.  Mild pain when you urinate. You may feel this pain in your lower back or lower abdomen. The pain should stop within a few minutes after you urinate. This may last for up to 1 week.  A small amount of blood in your urine for several days. Follow these instructions at home: Medicines  Take over-the-counter and prescription medicines only as told by your health care provider.  If you were prescribed an antibiotic medicine, take it as told by your health care provider. Do not stop taking the antibiotic even if you start to feel better.  Do not drive for 24 hours if you were given a sedative during your procedure.  Ask your health care provider if the medicine prescribed to you requires you to avoid driving or using heavy machinery. Activity  Rest as told by your health care provider.  Avoid sitting for a long time without moving. Get up to take short walks every 1-2 hours. This is important to improve blood flow and breathing. Ask for help if you feel weak or unsteady.  Return to your normal activities as told by your health care provider. Ask your health care provider what activities are safe for you. General instructions   Watch for any blood in your urine. Call your health care provider if the amount of blood in your urine increases.  If you have a catheter: ? Follow instructions from your health care provider about taking care of your catheter and collection bag. ? Do not take baths, swim, or use a hot tub until your health care provider approves. Ask your health care provider if you may take showers. You may only be allowed to  take sponge baths.  Drink enough fluid to keep your urine pale yellow.  Do not use any products that contain nicotine or tobacco, such as cigarettes, e-cigarettes, and chewing tobacco. These can delay healing after surgery. If you need help quitting, ask your health care provider.  Keep all follow-up visits as told by your health care provider. This is important. Contact a health care provider if:  You have pain that gets worse or does not get better with medicine, especially pain when you urinate.  You have difficulty urinating.  You feel nauseous or you vomit repeatedly during a period of more than 2 days after the procedure. Get help right away if:  Your urine is dark red or has blood clots in it.  You are leaking urine (have incontinence).  The end of the stent comes out of your urethra.  You cannot urinate.  You have sudden, sharp, or severe pain in your abdomen or lower back.  You have a fever.  You have swelling or pain in your legs.  You have difficulty breathing. Summary  After the procedure, it is common to have mild pain when you urinate that goes away within a few minutes after you urinate. This may last for up to 1 week.  Watch for any blood in your urine. Call your health care provider if the amount of blood in your urine increases.  Take over-the-counter and prescription medicines only as told by your health care provider.  Drink   enough fluid to keep your urine pale yellow. This information is not intended to replace advice given to you by your health care provider. Make sure you discuss any questions you have with your health care provider. Document Revised: 09/11/2018 Document Reviewed: 09/12/2018 Elsevier Patient Education  [place].  Prostatic Urethral Lift, Care After This sheet gives you information about how to care for yourself after your procedure. Your health care provider may also give you more specific instructions. If you have  problems or questions, contact your health care provider. What can I expect after the procedure? After the procedure, it is common to have:  Discomfort or burning when urinating.  An increased urge to urinate.  More frequent urination.  Urine that is slightly blood-tinged. These symptoms should go away after a few days. Follow these instructions at home:   Take over-the-counter and prescription medicines only as told by your health care provider.  Do not drive for 24 hours if you were given a medicine to help you relax (sedative).  Do not drive or use heavy machinery while taking prescription pain medicine.  Do not lift anything that is heavier than 10 lb (4.5 kg) until your health care provider says that this is safe.  Return to your normal activities as told by your health care provider. Ask your health care provider what activities are safe for you. Ask when you can return to sexual activity.  Drink enough fluid to keep your urine clear or pale yellow.  Keep all follow-up visits as told by your health care provider. This is important. Contact a health care provider if:  You have chills or a fever.  You have pain when passing urine.  You have bright red blood or blood clots in your urine.  You have difficulty passing urine.  You have leaking of urine (incontinence). Get help right away if:  You have chest pain or shortness of breath.  You have leg pain or swelling.  You cannot pass urine. Summary  After the procedure, it is common to have discomfort or burning when urinating, an increased urge to urinate, more frequent urination, and urine that is slightly blood-tinged.  Do not drive for 24 hours if you were given a medicine to help you relax (sedative). Do not drive or use heavy machinery while taking prescription pain medicine.  Do not lift anything that is heavier than 10 lb (4.5 kg) until your health care provider says that this is safe.  Return to your  normal activities as told by your health care provider. This information is not intended to replace advice given to you by your health care provider. Make sure you discuss any questions you have with your health care provider. Document Revised: 11/17/2017 Document Reviewed: 01/24/2017 Elsevier Patient Education  [place] Instructions  Activity: Get plenty of rest for the remainder of the day. A responsible individual must stay with you for 24 hours following the procedure.  For the next 24 hours, DO NOT: -Drive a car -Paediatric nurse -Drink alcoholic beverages -Take any medication unless instructed by your physician -Make any legal decisions or sign important papers.  Meals: Start with liquid foods such as gelatin or soup. Progress to regular foods as tolerated. Avoid greasy, spicy, heavy foods. If nausea and/or vomiting occur, drink only clear liquids until the nausea and/or vomiting subsides. Call your physician if vomiting continues.  Special Instructions/Symptoms: Your throat may feel dry or sore from the  anesthesia or the breathing tube placed in your throat during surgery. If this causes discomfort, gargle with warm salt water. The discomfort should disappear within 24 hours.

## 2020-04-14 NOTE — Anesthesia Preprocedure Evaluation (Signed)
Anesthesia Evaluation  Patient identified by MRN, date of birth, ID band Patient awake    Reviewed: Allergy & Precautions, NPO status , Patient's Chart, lab work & pertinent test results  History of Anesthesia Complications (+) PONV, AWARENESS UNDER ANESTHESIA and history of anesthetic complications  Airway Mallampati: I  TM Distance: >3 FB Neck ROM: Full    Dental no notable dental hx. (+) Dental Advisory Given, Teeth Intact   Pulmonary asthma , sleep apnea and Continuous Positive Airway Pressure Ventilation , COPD,  COPD inhaler, Current Smoker and Patient abstained from smoking.,    Pulmonary exam normal        Cardiovascular hypertension, Pt. on medications + CAD, + Cardiac Stents and + Peripheral Vascular Disease  Normal cardiovascular exam   '20 TTE -  EF >65%. Mild concentric left ventricular hypertrophy. Left ventricular diastolic Doppler parameters are consistent with impaired relaxation.    Neuro/Psych negative psych ROS   GI/Hepatic Neg liver ROS, GERD  Medicated and Controlled,  Endo/Other   Pre-DM   Renal/GU Renal disease Kidney stones Hydronephrosis      Musculoskeletal  (+) Arthritis ,   Abdominal Normal abdominal exam  (+)   Peds  Hematology negative hematology ROS (+)   Anesthesia Other Findings    Reproductive/Obstetrics                             Anesthesia Physical  Anesthesia Plan  ASA: III  Anesthesia Plan: General   Post-op Pain Management:    Induction: Intravenous  PONV Risk Score and Plan: 4 or greater and Treatment may vary due to age or medical condition, Ondansetron and Dexamethasone  Airway Management Planned: LMA  Additional Equipment: None  Intra-op Plan:   Post-operative Plan: Extubation in OR  Informed Consent: I have reviewed the patients History and Physical, chart, labs and discussed the procedure including the risks, benefits and  alternatives for the proposed anesthesia with the patient or authorized representative who has indicated his/her understanding and acceptance.     Dental advisory given  Plan Discussed with: CRNA  Anesthesia Plan Comments:         Anesthesia Quick Evaluation

## 2020-04-14 NOTE — Transfer of Care (Signed)
Immediate Anesthesia Transfer of Care Note  Patient: Gunner Iodice Hannula  Procedure(s) Performed: Procedure(s) (LRB): CYSTOSCOPY WITH URETEROSCOPY, RETROGRADE  AND STENT REPLACEMENT (Right) CYSTOSCOPY WITH INSERTION OF UROLIFT (N/A)  Patient Location: PACU  Anesthesia Type: General  Level of Consciousness: awake, oriented, sedated and patient cooperative  Airway & Oxygen Therapy: Patient Spontanous Breathing and Patient connected to face mask oxygen  Post-op Assessment: Report given to PACU RN and Post -op Vital signs reviewed and stable  Post vital signs: Reviewed and stable  Complications: No apparent anesthesia complications Last Vitals:  Vitals Value Taken Time  BP 115/59 04/14/20 1037  Temp 36.4 C 04/14/20 1037  Pulse 58 04/14/20 1041  Resp 14 04/14/20 1041  SpO2 100 % 04/14/20 1041  Vitals shown include unvalidated device data.  Last Pain:  Vitals:   04/14/20 0838  TempSrc: (P) Oral

## 2020-04-14 NOTE — Anesthesia Postprocedure Evaluation (Signed)
Anesthesia Post Note  Patient: Dean [name]  Procedure(s) Performed: CYSTOSCOPY WITH URETEROSCOPY, RETROGRADE  AND STENT REPLACEMENT (Right Renal) CYSTOSCOPY WITH INSERTION OF UROLIFT (N/A Renal)     Patient location during evaluation: PACU Anesthesia Type: General Level of consciousness: awake and sedated Pain management: pain level controlled Vital Signs Assessment: post-procedure vital signs reviewed and stable Respiratory status: spontaneous breathing Cardiovascular status: stable Postop Assessment: no apparent nausea or vomiting Anesthetic complications: no    Last Vitals:  Vitals:   04/14/20 1045 04/14/20 1100  BP: (!) 111/59 (!) 110/57  Pulse: (!) 58 (!) 53  Resp: 14 13  Temp:    SpO2: 98% 92%    Last Pain:  Vitals:   04/14/20 1037  TempSrc:   PainSc: 0-No pain   Pain Goal:                   Huston Foley

## 2020-04-14 NOTE — H&P (Signed)
H&P  Chief Complaint: Right hydronephrosis, BPH with lower urinary tract symptoms  History of Present Illness: 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  He presents today for stent change and repeat ureteroscopy for diagnostics to make a long-term plan for his right UPJ.  He also has a history of BPH on tamsulosin, finasteride and tried Vesicare in the past.  He has had progressive frequency and weak stream.  He had an episode of retention after an angiogram earlier this year.  Overall he has been well.  Minimal stent bother.  No significant dysuria or gross hematuria.  No fever.  Past Medical History:  Diagnosis Date  . AAA (abdominal aortic aneurysm) (HCC)    infrarenal aaa 3.1 x 2.8 per abd/pelvis ct 02-26-20 epic  . Allergy    pollen  . Arthritis   . Asthma   . Atrial fibrillation ([place])    no documentation in epic/ pt states has never been told has A Fib  . CAD (coronary artery disease)    s/p angioplasty and stenting x3 1994, pt states has this done  . Cataract    small, mild  . Chronic kidney disease    kidney cyst  . Complication of anesthesia    woke up during gall bladder sx yrs ago and with colonscopy  . Complication of anesthesia    pt thinks stopped breathing after gallbladder sx years ago, no record found  . COPD (chronic obstructive pulmonary disease) ([place])   . GERD (gastroesophageal reflux disease)    hx of  . History of kidney stones   . History of prediabetes    none in last 8 months since 40 lb weight loss  . Hyperlipidemia   . Hypertension   . Lower extremity edema since 02-14-2020   swelling goes down when sleeps  . Neuromuscular disorder ([place])    nerve issues with back   . Peripheral vascular disease ([place])   . PVC's (premature ventricular contractions)   . Sleep apnea    wears cpap auto set 5 to 20   Past Surgical  History:  Procedure Laterality Date  . APPENDECTOMY    . CHOLECYSTECTOMY  1990s   Gall Bladder  . COLONOSCOPY    . CORONARY ANGIOPLASTY WITH STENT PLACEMENT  1994  . CYSTOSCOPY WITH URETEROSCOPY AND STENT PLACEMENT Bilateral 01/03/2020   Procedure: CYSTOSCOPY WITH RETROGRADE/ URETEROSCOPY AND RIGHT STENT PLACEMENT BLADDER BIOPSY;  Surgeon: Festus Aloe, MD;  Location: [facility];  Service: Urology;  Laterality: Bilateral;  ONLY NEEDS 30 MIN  . EYE SURGERY Bilateral 2019   cataracts  . lumbar disectomy  2005   L3 - 4 - 5  . POLYPECTOMY    . PR VEIN BYPASS GRAFT,AORTO-FEM-POP  08/31/11   Right to Left Fem-Fem  . RENAL ANGIOGRAPHY N/A 02/14/2020   Procedure: RENAL ANGIOGRAPHY;  Surgeon: Elam Dutch, MD;  Location: [facility];  Service: Cardiovascular;  Laterality: N/A;  Bilateral  . TONSILLECTOMY    . UMBILICAL HERNIA REPAIR      Home Medications:  Medications Prior to Admission  Medication Sig Dispense Refill Last Dose  . albuterol (PROVENTIL HFA) 108 (90 Base) MCG/ACT inhaler Inhale 1 puff into the lungs 2 (two) times daily. 8 g 5 04/14/2020 at 0600  . albuterol (PROVENTIL) (2.5 MG/3ML) 0.083% nebulizer solution USE 1 VIAL VIA NEBULIZER EVERY  6 HOURS AS NEEDED FOR WHEEZING OR SHORTNESS OF BREATH (Patient taking differently: Take 2.5 mg by nebulization every 6 (six) hours as needed for wheezing. ) 225 mL 12 04/13/2020 at Unknown time  . antiseptic oral rinse (BIOTENE) LIQD 15 mLs by Mouth Rinse route as needed for dry mouth.   04/14/2020 at 0645  . aspirin 325 MG tablet Take 325 mg by mouth daily with supper.    Past Week at Unknown time  . atorvastatin (LIPITOR) 10 MG tablet Take 10 mg by mouth every evening.    04/13/2020 at Unknown time  . Azelastine-Fluticasone 137-50 MCG/ACT SUSP PLACE 1-2 SPRAYS INTO EACH NOSTRIL AT BEDTIME (Patient taking differently: Place 1-2 sprays into both nostrils 2 (two) times daily as needed (congestion.). ) 23 g 3 04/13/2020 at  Unknown time  . diltiazem (TIAZAC) 240 MG 24 hr capsule Take 240 mg by mouth daily.    04/14/2020 at 0600  . docusate sodium (COLACE) 100 MG capsule Take 100 mg by mouth daily as needed for mild constipation.   04/13/2020 at Unknown time  . esomeprazole (NEXIUM) 40 MG capsule Take 40 mg by mouth daily as needed (acid reflux/indigestion.).    04/13/2020 at Unknown time  . finasteride (PROSCAR) 5 MG tablet Take 5 mg by mouth at bedtime.    04/13/2020 at Unknown time  . furosemide (LASIX) 40 MG tablet Take 1 tablet (40 mg total) by mouth daily. 3 tablet 0 Past Week at Unknown time  . isosorbide mononitrate (IMDUR) 60 MG 24 hr tablet Take 60 mg by mouth every evening.    04/13/2020 at Unknown time  . loperamide (IMODIUM A-D) 2 MG tablet Take 2 mg by mouth 4 (four) times daily as needed for diarrhea or loose stools.   04/13/2020 at Unknown time  . Magnesium 400 MG TABS Take 400 mg by mouth every other day. In the evening   Past Week at Unknown time  . meloxicam (MOBIC) 15 MG tablet Take 15 mg by mouth daily as needed for pain.   Past Month at Unknown time  . Menthol, Topical Analgesic, (BLUE-EMU MAXIMUM STRENGTH EX) Apply 1 application topically 3 (three) times daily as needed (pain.).   Past Week at Unknown time  . pseudoephedrine-acetaminophen (TYLENOL SINUS) 30-500 MG TABS tablet Take 1 tablet by mouth every 4 (four) hours as needed (sinus congestion).   Past Month at Unknown time  . simethicone (MYLICON) 423 MG chewable tablet Chew 125 mg by mouth every 6 (six) hours as needed for flatulence. Takes gas x   04/13/2020 at Unknown time  . tamsulosin (FLOMAX) 0.4 MG CAPS capsule Take 0.4 mg by mouth at bedtime.    04/13/2020 at Unknown time  . Vitamin D, Ergocalciferol, (DRISDOL) 50000 UNITS CAPS Take 50,000 Units by mouth every Friday at 6 PM.    Past Month at Unknown time  . hydroxypropyl methylcellulose / hypromellose (ISOPTO TEARS / GONIOVISC) 2.5 % ophthalmic solution Place 1 drop into both eyes 3 (three)  times daily as needed for dry eyes.     Marland Kitchen irbesartan (AVAPRO) 150 MG tablet Take 150 mg by mouth every evening.       Allergies:  Allergies  Allergen Reactions  . Penicillins     Childhood reaction  REACTION: rapid pulse Did it involve swelling of the face/tongue/throat, SOB, or low BP? Yes Did it involve sudden or severe rash/hives, skin peeling, or any reaction on the inside of your mouth or nose? Unknown Did you need to  seek medical attention at a hospital or doctor's office? yes When did it last happen?childhood  If all above answers are "NO", may proceed with cephalosporin use.   . Other     Oily seafood salmon-- chest pain  . Mold Extract [Trichophyton] Rash    rash    Family History  Problem Relation Age of Onset  . Other Father        ETOH  . Diabetes Father   . Hypertension Father   . Kidney cancer Mother   . Cancer Mother        renal  . Hyperlipidemia Mother   . Hypertension Mother   . Cancer Sister        breast  . Hyperlipidemia Sister   . Hypertension Sister   . Breast cancer Sister   . Hyperlipidemia Brother   . Hypertension Brother   . Prostate cancer Brother   . Colon cancer Neg Hx   . Colon polyps Neg Hx   . Esophageal cancer Neg Hx   . Rectal cancer Neg Hx   . Stomach cancer Neg Hx    Social History:  reports that he has been smoking cigarettes. He has a 51.00 pack-year smoking history. He has never used smokeless tobacco. He reports previous alcohol use. He reports that he does not use drugs.  ROS: A complete review of systems was performed.  All systems are negative except for pertinent findings as noted. Review of Systems  Cardiovascular: Positive for leg swelling.  Musculoskeletal: Positive for joint pain.     Physical Exam:  Vital signs in last 24 hours: Temp:  [97.1 F (36.2 C)] (P) 97.1 F (36.2 C) (04/27 0838) Pulse Rate:  [66] (P) 66 (04/27 0838) Resp:  [16] (P) 16 (04/27 0838) BP: (P) 102/79 (04/27 1610) Weight:   [70.9 kg-71.2 kg] 70.9 kg (04/27 0849) General:  Alert and oriented, No acute distress HEENT: Normocephalic, atraumatic Cardiovascular: Regular rate and rhythm Lungs: Regular rate and effort Abdomen: Soft, nontender, nondistended, no abdominal masses Back: No CVA tenderness Extremities: minimal edema, no calf pain or swelling  Neurologic: Grossly intact  Laboratory Data:  Results for orders placed or performed during the hospital encounter of 04/14/20 (from the past 24 hour(s))  I-STAT, chem 8     Status: Abnormal   Collection Time: 04/14/20  8:13 AM  Result Value Ref Range   Sodium 140 135 - 145 mmol/L   Potassium 3.8 3.5 - 5.1 mmol/L   Chloride 99 98 - 111 mmol/L   BUN 24 (H) 8 - 23 mg/dL   Creatinine, Ser 1.80 (H) 0.61 - 1.24 mg/dL   Glucose, Bld 86 70 - 99 mg/dL   Calcium, Ion 1.27 1.15 - 1.40 mmol/L   TCO2 34 (H) 22 - 32 mmol/L   Hemoglobin 13.9 13.0 - 17.0 g/dL   HCT 41.0 39.0 - 52.0 %   Recent Results (from the past 240 hour(s))  SARS CORONAVIRUS 2 (TAT 6-24 HRS) Nasopharyngeal Nasopharyngeal Swab     Status: None   Collection Time: 04/10/20  1:10 PM   Specimen: Nasopharyngeal Swab  Result Value Ref Range Status   SARS Coronavirus 2 NEGATIVE NEGATIVE Final    Comment: (NOTE) SARS-CoV-2 target nucleic acids are NOT DETECTED. The SARS-CoV-2 RNA is generally detectable in upper and lower respiratory specimens during the acute phase of infection. Negative results do not preclude SARS-CoV-2 infection, do not rule out co-infections with other pathogens, and should not be used as the sole basis for  treatment or other patient management decisions. Negative results must be combined with clinical observations, patient history, and epidemiological information. The expected result is Negative. Fact Sheet for Patients: SugarRoll.be Fact Sheet for Healthcare Providers: https://www.woods-mathews.com/ This test is not yet approved or  cleared by the [place] FDA and  has been authorized for detection and/or diagnosis of SARS-CoV-2 by FDA under an Emergency Use Authorization (EUA). This EUA will remain  in effect (meaning this test can be used) for the duration of the COVID-19 declaration under Section 56 4(b)(1) of the Act, 21 U.S.C. section 360bbb-3(b)(1), unless the authorization is terminated or revoked sooner. Performed at [facility], [place] [address], [place], [place] [address]    Creatinine: Recent Labs    04/14/20 0813  CREATININE 1.80*    Impression/Assessment:  Right UPJ obstruction, BPH  Plan:  I discussed with the patient the nature, potential benefits, risks and alternatives to cystoscopy, right ureteroscopy, right stent change, UroLift, including side effects of the proposed treatments, the likelihood of the patient achieving the goals of the procedure, and any potential problems that might occur during the procedure or recuperation. All questions answered. Patient elects to proceed.  His creatinine remained stable at 1.8.  We discussed he is clear from a urologic point of view to pursue any other treatment such as hip replacement.  His kidney function was stabilized from a GU point of view when the stent was placed in January.  As far as the stent we discussed long-term management with stent change which we can hopefully stretch out to every 6 months depending on how the stent looks today or lap pyeloplasty.  He will be considering those options.  We also discussed UroLift typically improves urine flow, and in many cases frequency and urgency improve but sometimes frequency and urgency remain the same, rarely worsen among other risks.     Festus Aloe 04/14/2020, 9:21 AM

## 2020-04-14 NOTE — Anesthesia Procedure Notes (Signed)
Procedure Name: LMA Insertion Date/Time: 04/14/2020 9:39 AM Performed by: Suan Halter, CRNA Pre-anesthesia Checklist: Patient identified, Emergency Drugs available, Suction available and Patient being monitored Patient Re-evaluated:Patient Re-evaluated prior to induction Oxygen Delivery Method: Circle system utilized Preoxygenation: Pre-oxygenation with 100% oxygen Induction Type: IV induction Ventilation: Mask ventilation without difficulty LMA: LMA inserted LMA Size: 4.0 Number of attempts: 1 Airway Equipment and Method: Bite block Placement Confirmation: positive ETCO2 Tube secured with: Tape Dental Injury: Teeth and Oropharynx as per pre-operative assessment

## 2020-04-16 ENCOUNTER — Telehealth (INDEPENDENT_AMBULATORY_CARE_PROVIDER_SITE_OTHER): Payer: Medicare PPO | Admitting: Physician Assistant

## 2020-04-16 VITALS — BP 124/70 | Ht 64.5 in | Wt 157.0 lb

## 2020-04-16 DIAGNOSIS — Z720 Tobacco use: Secondary | ICD-10-CM

## 2020-04-16 DIAGNOSIS — E785 Hyperlipidemia, unspecified: Secondary | ICD-10-CM

## 2020-04-16 DIAGNOSIS — I10 Essential (primary) hypertension: Secondary | ICD-10-CM | POA: Diagnosis not present

## 2020-04-16 DIAGNOSIS — I2510 Atherosclerotic heart disease of native coronary artery without angina pectoris: Secondary | ICD-10-CM

## 2020-04-16 DIAGNOSIS — N184 Chronic kidney disease, stage 4 (severe): Secondary | ICD-10-CM | POA: Diagnosis not present

## 2020-04-16 DIAGNOSIS — J449 Chronic obstructive pulmonary disease, unspecified: Secondary | ICD-10-CM

## 2020-04-16 DIAGNOSIS — I739 Peripheral vascular disease, unspecified: Secondary | ICD-10-CM

## 2020-04-16 DIAGNOSIS — G4733 Obstructive sleep apnea (adult) (pediatric): Secondary | ICD-10-CM | POA: Diagnosis not present

## 2020-04-16 DIAGNOSIS — R7303 Prediabetes: Secondary | ICD-10-CM | POA: Diagnosis not present

## 2020-04-16 DIAGNOSIS — Z9989 Dependence on other enabling machines and devices: Secondary | ICD-10-CM

## 2020-04-16 NOTE — Progress Notes (Signed)
Virtual Visit via Telephone Note   This visit type was conducted due to national recommendations for restrictions regarding the COVID-19 Pandemic (e.g. social distancing) in an effort to limit this patient's exposure and mitigate transmission in our community.  Due to his co-morbid illnesses, this patient is at least at moderate risk for complications without adequate follow up.  This format is felt to be most appropriate for this patient at this time.  The patient did not have access to video technology/had technical difficulties with video requiring transitioning to audio format only (telephone).  All issues noted in this document were discussed and addressed.  No physical exam could be performed with this format.  Please refer to the patient's chart for his  consent to telehealth for [facility].   The patient was identified using 2 identifiers.  Date:  04/18/2020   ID:  Dean [name], DOB 1946-09-08, MRN [id number]  Patient Location: Home Provider Location: Office  PCP:  [name], MD  Cardiologist:  Pixie Casino, MD  Electrophysiologist:  None   Evaluation Performed:  Follow-Up Visit  Chief Complaint:  followup  History of Present Illness:    Dean [name] is a 74 y.o. male with CAD, CKD IV, tobacco abuse, COPD, HTN, HLD, prediabetes, OSA on CPAP and PAD s/p left femorofemoral bypass for occluded distal aorta.  Myoview obtained on 12/31/2014 was negative for ischemia however did show small fixed defect which was likely artifact.  Echocardiogram obtained on 08/23/2019 showed normal LV function, mild concentric LVH, diastolic dysfunction.  There was no significant valvular abnormality. Patient underwent renal artery angiography that showed approximately 50% heavily calcified left renal artery stenosis with no obvious pressure gradient, widely patent right renal artery.  I last saw the patient on 03/18/2020 at which time he has been noticing increasing lower extremity  edema.  He has been compliant with CPAP therapy.  I started him on 3 days of diuretic.  He underwent cystoscopy and right ureter stent exchange.  Patient was contacted today via telephone visit.  He continued to have some swelling, however swelling tend to go away after overnight sleep and returned by the end of the day.  I suspect the symptom he is describing is venous insufficiency.  At this time, I do not recommend any additional diuretic.  I recommend continued conservative management such as leg elevation and a compression stocking.  He denies any recent chest pain.  He has hip issues preventing him from exercising.  He is unable to walk a long distance with a walker and he require a lift to go up stairs.  He has upcoming hip surgery.  Since his echocardiogram is normal and that he does not have any obvious anginal symptom, I think he should be able to proceed with his surgery.  I will double check with Dr. Debara Pickett to make sure he is agreeable or if he think the patient will need a stress test.  The patient does not have symptoms concerning for COVID-19 infection (fever, chills, cough, or new shortness of breath).    Past Medical History:  Diagnosis Date  . AAA (abdominal aortic aneurysm) (HCC)    infrarenal aaa 3.1 x 2.8 per abd/pelvis ct 02-26-20 epic  . Allergy    pollen  . Arthritis   . Asthma   . Atrial fibrillation ([place])    no documentation in epic/ pt states has never been told has A Fib  . CAD (coronary artery disease)    s/p  angioplasty and stenting x3 1994, pt states has this done  . Cataract    small, mild  . Chronic kidney disease    kidney cyst  . Complication of anesthesia    woke up during gall bladder sx yrs ago and with colonscopy  . Complication of anesthesia    pt thinks stopped breathing after gallbladder sx years ago, no record found  . COPD (chronic obstructive pulmonary disease) ([place])   . GERD (gastroesophageal reflux disease)    hx of  . History of kidney  stones   . History of prediabetes    none in last 8 months since 40 lb weight loss  . Hyperlipidemia   . Hypertension   . Lower extremity edema since 02-14-2020   swelling goes down when sleeps  . Neuromuscular disorder ([place])    nerve issues with back   . Peripheral vascular disease ([place])   . PVC's (premature ventricular contractions)   . Sleep apnea    wears cpap auto set 5 to 20   Past Surgical History:  Procedure Laterality Date  . APPENDECTOMY    . CHOLECYSTECTOMY  1990s   Gall Bladder  . COLONOSCOPY    . CORONARY ANGIOPLASTY WITH STENT PLACEMENT  1994  . CYSTOSCOPY WITH INSERTION OF UROLIFT N/A 04/14/2020   Procedure: CYSTOSCOPY WITH INSERTION OF UROLIFT;  Surgeon: Festus Aloe, MD;  Location: [facility];  Service: Urology;  Laterality: N/A;  . CYSTOSCOPY WITH URETEROSCOPY AND STENT PLACEMENT Bilateral 01/03/2020   Procedure: CYSTOSCOPY WITH RETROGRADE/ URETEROSCOPY AND RIGHT STENT PLACEMENT BLADDER BIOPSY;  Surgeon: Festus Aloe, MD;  Location[facility];  Service: Urology;  Laterality: Bilateral;  ONLY NEEDS 30 MIN  . CYSTOSCOPY WITH URETEROSCOPY AND STENT PLACEMENT Right 04/14/2020   Procedure: CYSTOSCOPY WITH URETEROSCOPY, RETROGRADE  AND STENT REPLACEMENT;  Surgeon: Festus Aloe, MD;  Location[facility];  Service: Urology;  Laterality: Right;  . EYE SURGERY Bilateral 2019   cataracts  . lumbar disectomy  2005   L3 - 4 - 5  . POLYPECTOMY    . PR VEIN BYPASS GRAFT,AORTO-FEM-POP  08/31/11   Right to Left Fem-Fem  . RENAL ANGIOGRAPHY N/A 02/14/2020   Procedure: RENAL ANGIOGRAPHY;  Surgeon: Elam Dutch, MD;  Location: [facility];  Service: Cardiovascular;  Laterality: N/A;  Bilateral  . TONSILLECTOMY    . UMBILICAL HERNIA REPAIR       Current Meds  Medication Sig  . albuterol (PROVENTIL HFA) 108 (90 Base) MCG/ACT inhaler Inhale 1 puff into the lungs 2 (two) times daily.  Marland Kitchen albuterol (PROVENTIL)  (2.5 MG/3ML) 0.083% nebulizer solution USE 1 VIAL VIA NEBULIZER EVERY 6 HOURS AS NEEDED FOR WHEEZING OR SHORTNESS OF BREATH (Patient taking differently: Take 2.5 mg by nebulization every 6 (six) hours as needed for wheezing. )  . antiseptic oral rinse (BIOTENE) LIQD 15 mLs by Mouth Rinse route as needed for dry mouth.  Marland Kitchen aspirin 325 MG tablet Take 325 mg by mouth daily with supper.   Marland Kitchen atorvastatin (LIPITOR) 10 MG tablet Take 10 mg by mouth every evening.   . Azelastine-Fluticasone 137-50 MCG/ACT SUSP PLACE 1-2 SPRAYS INTO EACH NOSTRIL AT BEDTIME (Patient taking differently: Place 1-2 sprays into both nostrils 2 (two) times daily as needed (congestion.). )  . diltiazem (TIAZAC) 240 MG 24 hr capsule Take 240 mg by mouth daily.   Marland Kitchen docusate sodium (COLACE) 100 MG capsule Take 100 mg by mouth daily as needed for mild constipation.  Marland Kitchen  esomeprazole (NEXIUM) 40 MG capsule Take 40 mg by mouth daily as needed (acid reflux/indigestion.).   Marland Kitchen finasteride (PROSCAR) 5 MG tablet Take 5 mg by mouth at bedtime.   . hydroxypropyl methylcellulose / hypromellose (ISOPTO TEARS / GONIOVISC) 2.5 % ophthalmic solution Place 1 drop into both eyes 3 (three) times daily as needed for dry eyes.  Marland Kitchen irbesartan (AVAPRO) 150 MG tablet Take 150 mg by mouth every evening.   . isosorbide mononitrate (IMDUR) 60 MG 24 hr tablet Take 60 mg by mouth every evening.   . loperamide (IMODIUM A-D) 2 MG tablet Take 2 mg by mouth 4 (four) times daily as needed for diarrhea or loose stools.  . Magnesium 400 MG TABS Take 400 mg by mouth every other day. In the evening  . meloxicam (MOBIC) 15 MG tablet Take 15 mg by mouth daily as needed for pain.  . Menthol, Topical Analgesic, (BLUE-EMU MAXIMUM STRENGTH EX) Apply 1 application topically 3 (three) times daily as needed (pain.).  Marland Kitchen pseudoephedrine-acetaminophen (TYLENOL SINUS) 30-500 MG TABS tablet Take 1 tablet by mouth every 4 (four) hours as needed (sinus congestion).  . simethicone (MYLICON)  563 MG chewable tablet Chew 125 mg by mouth every 6 (six) hours as needed for flatulence. Takes gas x  . tamsulosin (FLOMAX) 0.4 MG CAPS capsule Take 0.4 mg by mouth at bedtime.   . Vitamin D, Ergocalciferol, (DRISDOL) 50000 UNITS CAPS Take 50,000 Units by mouth every Friday at 6 PM.   . [DISCONTINUED] furosemide (LASIX) 40 MG tablet Take 1 tablet (40 mg total) by mouth daily.     Allergies:   Penicillins, Other, and Mold extract [trichophyton]   Social History   Tobacco Use  . Smoking status: Current Every Day Smoker    Packs/day: 1.00    Years: 51.00    Pack years: 51.00    Types: Cigarettes  . Smokeless tobacco: Never Used  Substance Use Topics  . Alcohol use: Not Currently    Alcohol/week: 0.0 standard drinks  . Drug use: No     Family Hx: The patient's family history includes Breast cancer in his sister; Cancer in his mother and sister; Diabetes in his father; Hyperlipidemia in his brother, mother, and sister; Hypertension in his brother, father, mother, and sister; Kidney cancer in his mother; Other in his father; Prostate cancer in his brother. There is no history of Colon cancer, Colon polyps, Esophageal cancer, Rectal cancer, or Stomach cancer.  ROS:   Please see the history of present illness.     All other systems reviewed and are negative.   Prior CV studies:   The following studies were reviewed today:  Myoview 12/31/2014 Impression Exercise Capacity:  Lexiscan with no exercise. BP Response:  Normal blood pressure response. Clinical Symptoms:  No significant symptoms noted. ECG Impression:  There are scattered PVCs. Comparison with Prior Nuclear Study: No previous nuclear study performed  Overall Impression:  Low risk stress nuclear study with small, mild fixed inferoapical defect, likely artifact or small area of scar. The study was non-gated due to ectopy.  LV Wall Motion:  Non-gated due to ectopy    Echo 08/23/2019 1. The left ventricle has  hyperdynamic systolic function, with an  ejection fraction of >65%. The cavity size was normal. There is mild  concentric left ventricular hypertrophy. Left ventricular diastolic  Doppler parameters are consistent with impaired  relaxation.  2. The right ventricle has normal systolic function. The cavity was  normal. There is no increase in  right ventricular wall thickness. Right  ventricular systolic pressure could not be assessed.  3. The aortic valve is tricuspid. Mild thickening of the aortic valve.  Mild calcification of the aortic valve.  4. The aorta is normal unless otherwise noted.    Labs/Other Tests and Data Reviewed:    EKG:  An ECG dated 08/14/2019 was personally reviewed today and demonstrated:  Normal sinus rhythm, incomplete right bundle branch block, occasional PVCs.  Recent Labs: 08/14/2019: Magnesium 1.8 11/10/2019: Platelets 292 04/14/2020: BUN 24; Creatinine, Ser 1.80; Hemoglobin 13.9; Potassium 3.8; Sodium 140   Recent Lipid Panel No results found for: CHOL, TRIG, HDL, CHOLHDL, LDLCALC, LDLDIRECT  Wt Readings from Last 3 Encounters:  04/16/20 157 lb (71.2 kg)  04/14/20 156 lb 4.8 oz (70.9 kg)  03/18/20 161 lb (73 kg)     Objective:    Vital Signs:  BP 124/70   Ht 5' 4.5" (1.638 m)   Wt 157 lb (71.2 kg)   BMI 26.53 kg/m    VITAL SIGNS:  reviewed  ASSESSMENT & PLAN:    1. CAD: Denies any recent chest pain.  Unfortunately due to hip issue, he cannot exercise for long period of time.  He has potential upcoming hip surgery, last Myoview was in 2016, I will double check with Dr. Debara Pickett to see if he would recommend additional ischemic work-up in this case.  2. CKD stage IV: Stable on recent lab work.  3. Tobacco abuse: Tobacco cessation imperative  4. COPD: No recent exacerbation  5. Hypertension: Blood pressure well controlled  6. Hyperlipidemia: Continue statin therapy, Lipitor 10 mg daily.  7. Prediabetes: Managed by primary care  provider  8. Obstructive sleep apnea: On CPAP therapy  9. PAD: Status post left femoral-femoral bypass.  He denies claudication symptoms.  COVID-19 Education: The signs and symptoms of COVID-19 were discussed with the patient and how to seek care for testing (follow up with PCP or arrange E-visit).  The importance of social distancing was discussed today.  Time:   Today, I have spent 13 minutes with the patient with telehealth technology discussing the above problems.     Medication Adjustments/Labs and Tests Ordered: Current medicines are reviewed at length with the patient today.  Concerns regarding medicines are outlined above.   Tests Ordered: No orders of the defined types were placed in this encounter.   Medication Changes: No orders of the defined types were placed in this encounter.   Follow Up:  Either In Person or Virtual in 6 month(s)  Signed, Almyra Deforest, [place]  04/18/2020 10:22 PM    [place]

## 2020-04-16 NOTE — Patient Instructions (Signed)
Medication Instructions:  Continue current medications  *If you need a refill on your cardiac medications before your next appointment, please call your pharmacy*   Lab Work: None Ordered  Testing/Procedures: None Ordered   Follow-Up: At [facility], you and your health needs are our priority.  As part of our continuing mission to provide you with exceptional heart care, we have created designated Provider Care Teams.  These Care Teams include your primary Cardiologist (physician) and Advanced Practice Providers (APPs -  Physician Assistants and Nurse Practitioners) who all work together to provide you with the care you need, when you need it.  We recommend signing up for the patient portal called "MyChart".  Sign up information is provided on this After Visit Summary.  MyChart is used to connect with patients for Virtual Visits (Telemedicine).  Patients are able to view lab/test results, encounter notes, upcoming appointments, etc.  Non-urgent messages can be sent to your provider as well.   To learn more about what you can do with MyChart, go to [email or web address].    Your next appointment:   3 month(s)  The format for your next appointment:   In Person  Provider:   You may see Pixie Casino, MD or one of the following Advanced Practice Providers on your designated Care Team:    Almyra Deforest, PA-C  Fabian Sharp, PA-C or   Roby Lofts, [place]    Other Instructions Keep legs elevated and wear compression stocking daily

## 2020-04-22 ENCOUNTER — Telehealth: Payer: Self-pay | Admitting: Internal Medicine

## 2020-04-22 NOTE — Telephone Encounter (Signed)
Spoke with patient regarding prior message. Advised patient that I have not received his surgery clearance forms.Advised patient I will check again tomorrow if not I will contact him by the afternoon. Patient's voice was understanding .

## 2020-04-22 NOTE — Telephone Encounter (Signed)
Called and spoke with pt who stated he is wanting to know if Dr. Annamaria Boots has received surgery clearance forms. Pt stated this will be the second time this is having to be done as the first time forms were filled out, it had been too long so they are needing them refilled out.  Dr. Carolann Littler please advise if forms have been received and faxed back for pt's surgical clearance.

## 2020-04-23 NOTE — Telephone Encounter (Signed)
LMTCB x1 for pt.  

## 2020-04-23 NOTE — Telephone Encounter (Signed)
Pt called back about this, please return call:  Pt said to let you know he's still waiting to hear back from other dr.

## 2020-04-23 NOTE — Telephone Encounter (Signed)
I do not have any clearance form here for him. I think I sent it to his surgery office a while back.  Can you please ask him who the surgeon is to be, then contact that office to see if they still need a clearance note from [place] and if so to please send new request.  Thanks !

## 2020-04-23 NOTE — Telephone Encounter (Signed)
Noted  

## 2020-04-29 DIAGNOSIS — N1339 Other hydronephrosis: Secondary | ICD-10-CM | POA: Diagnosis not present

## 2020-04-29 DIAGNOSIS — R350 Frequency of micturition: Secondary | ICD-10-CM | POA: Diagnosis not present

## 2020-04-29 DIAGNOSIS — N401 Enlarged prostate with lower urinary tract symptoms: Secondary | ICD-10-CM | POA: Diagnosis not present

## 2020-04-29 DIAGNOSIS — R8271 Bacteriuria: Secondary | ICD-10-CM | POA: Diagnosis not present

## 2020-04-30 ENCOUNTER — Telehealth: Payer: Self-pay | Admitting: Internal Medicine

## 2020-04-30 NOTE — Telephone Encounter (Signed)
Spoke with pt, states he was returning Lindsay's call from 5/6 regarding his sx clearance.  Pt advised that he has an appt on 5/26 with CY and will discuss sx clearance with him at that appt, and that nothing further is needed at this time.  Nothing further needed at this time- will close encounter.

## 2020-05-08 ENCOUNTER — Ambulatory Visit: Payer: Medicare Other | Admitting: Internal Medicine

## 2020-05-08 ENCOUNTER — Telehealth: Payer: Self-pay

## 2020-05-08 DIAGNOSIS — M545 Low back pain: Secondary | ICD-10-CM | POA: Diagnosis not present

## 2020-05-08 DIAGNOSIS — M1611 Unilateral primary osteoarthritis, right hip: Secondary | ICD-10-CM | POA: Diagnosis not present

## 2020-05-08 NOTE — Telephone Encounter (Signed)
   [name] Pre-operative Risk Assessment    HEARTCARE STAFF: - Please ensure there is not already an duplicate clearance open for this procedure. - Under Visit Info/Reason for Call, type in Other and utilize the format Clearance MM/DD/YY or Clearance TBD. Do not use dashes or single digits.  Request for surgical clearance:  1. What type of surgery is being performed? Right total hip replacement  2. When is this surgery scheduled? tbd   3. What type of clearance is required (medical clearance vs. Pharmacy clearance to hold med vs. Both)? both  4. Are there any medications that need to be held prior to surgery and how long? Pt on ASA   5. Practice name and name of physician performing surgery? [facility]-- Johnny Bridge MD   6. What is the office phone number? [phone]   7.   What is the office fax number? [phone]  8.   Anesthesia type (None, local, MAC, general) ? unknown   Dean [name] June 05/08/2020, 2:53 PM  _________________________________________________________________   (provider comments below)

## 2020-05-11 ENCOUNTER — Encounter: Payer: Self-pay | Admitting: Internal Medicine

## 2020-05-12 NOTE — Telephone Encounter (Signed)
[place] for hip surgery - can hold aspirin up to 7 days if necessary for surgery.  Dr Debara Pickett

## 2020-05-12 NOTE — Telephone Encounter (Signed)
   Primary Cardiologist: Pixie Casino, MD  Chart reviewed as part of pre-operative protocol coverage. Per Dr. Debara Pickett, patient OK for hip surgery and at acceptable risk for procedure without further cardiovascular testing. OK to hold Aspirin for 7 days prior to surgery if needed but this should be restarted as soon as possible in post-operative period.    I will route this recommendation to the requesting party via Epic fax function and remove from pre-op pool.  Please call with questions.  Darreld Mclean, PA-C 05/12/2020, 11:10 AM

## 2020-05-13 ENCOUNTER — Ambulatory Visit (INDEPENDENT_AMBULATORY_CARE_PROVIDER_SITE_OTHER): Payer: Medicare PPO

## 2020-05-13 ENCOUNTER — Encounter: Payer: Self-pay | Admitting: Internal Medicine

## 2020-05-13 ENCOUNTER — Other Ambulatory Visit: Payer: Self-pay

## 2020-05-13 ENCOUNTER — Ambulatory Visit: Payer: Medicare PPO | Admitting: Internal Medicine

## 2020-05-13 VITALS — BP 110/50 | HR 59 | Temp 98.2°F | Ht 64.5 in | Wt 161.4 lb

## 2020-05-13 DIAGNOSIS — F17200 Nicotine dependence, unspecified, uncomplicated: Secondary | ICD-10-CM

## 2020-05-13 DIAGNOSIS — J449 Chronic obstructive pulmonary disease, unspecified: Secondary | ICD-10-CM | POA: Diagnosis not present

## 2020-05-13 DIAGNOSIS — R911 Solitary pulmonary nodule: Secondary | ICD-10-CM | POA: Diagnosis not present

## 2020-05-13 DIAGNOSIS — G4733 Obstructive sleep apnea (adult) (pediatric): Secondary | ICD-10-CM | POA: Diagnosis not present

## 2020-05-13 MED ORDER — BREZTRI AEROSPHERE 160-9-4.8 MCG/ACT IN AERO: 2.0000 | INHALATION_SPRAY | Freq: Two times a day (BID) | RESPIRATORY_TRACT | 0 refills | Status: DC

## 2020-05-13 NOTE — Patient Instructions (Signed)
Order- CXR    Dx COPD mixed type  Sample x 2 Breztri inhaler      Inhale 2 puffs, then rinse mouth, twice daily See if it helps your breathing without aggravating urinary retention issues. You can still use your albuterol inhaler and nebulizer as before, if needed.  Please try hard now to cut down and stop your smoking before your surgery.  Good luck with everything- hope it all goes well.  Please call if we can help

## 2020-05-13 NOTE — Assessment & Plan Note (Signed)
Continue to encourage smoking cessation commitment.

## 2020-05-13 NOTE — Assessment & Plan Note (Signed)
No recent exacerbation. No respiratory problems reported from GOT/ cystoscopy earlier this year. Plan- Encourage smoking cessation. Try samples Breztri inhaler. CXR.

## 2020-05-13 NOTE — Progress Notes (Signed)
Patient ID: AMEAR [name], male    DOB: [birth year], 74 y.o.   MRN: [id number]  HPI M  Smoker followed for COPD, obstructive sleep apnea, lung nodule, allergic rhinitis. NPSG 04/06/1999   AHI 87/hr, desaturation to 83%, body weight 185 pounds PFT- /04/2009-severe obstructive airways disease with insignificant response to bronchodilator. FVC 3.18/83%, FEV1 1.53/56%, ratio 0.48, TLC 86%, DLCO 51% Office Spirometry 03/20/2017-very severe obstructive airways disease. FVC 1.64/46%, FEV1 0.73/28%, ratio 0.45 ---------------------------------------------------------------------------------------------- .  11/06/2019-  Virtual Visit via Telephone Note  History of Present Illness: 74 yoM  Smoker followed for COPD, allergic rhinitis, , obstructive sleep apnea, lung nodule, allergic rhinitis., complicated by PAOD, HBP, CAD, GERD, Parotid Mass (Dean [name]/ ENT) CPAP auto 5-20/ Dean [name] compliance 100%, AHI 0.4/ hr Doing well with CPAP- no concerns. Sleeps poorly due to hip pain. Pending hip replacement next week and hopes to get this done before Covid rules change again at hospital.  Reports persistent diarrhea- being evaluated. He is concerned frequent use of Imodium affects kidney function.   Had flu vax.  Smoking steadily. Little cough. No acute breathing issues.  albut hfa, neb albut, Dymista,    Observations/Objective: CXR 05/06/2019- IMPRESSION: 1. No acute cardiopulmonary disease. 2. Findings consistent with COPD/emphysema, stable from the prior study.  ECHO 9/4- EF 65%, LVH, no evidence of PAH  Assessment and Plan: OSA- benefits with good compliance- to continue auto 5-20/ [employer] Tobacco abuse- Urgently needs to stop smoking. Diarrhea- managed elsewhere.- discuss with PCP. ? ischemic? Follow Up Instructions: 6 months   05/13/20- 74 yoM  Smoker followed for COPD, allergic rhinitis, , obstructive sleep apnea, lung nodule, allergic rhinitis., complicated by PAOD, HBP, CAD, GERD,  Parotid Mass (Dean [name]/ ENT) Had 2 Phizer Covax. CPAP auto 5-20/ [employer] Download compliance 100%, AHI 0.5/ hr Body weight today 161 lbs Neb albuterol, albuterol hfa,  -----no problems with cpap. no leaks in mask He is very pleased with CPAP, using it every night.  -Planning R THR/ Dean [name] Had GOT for cystoscopy 01/11/57 without complication. Cough stable, occasionally productive clear mucus.  Mobility limited by hip pain. Comes with rolling walker today.  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   ROS-see HPI  + = positive Constitutional:   No-   weight loss, night sweats, fevers, chills, fatigue, lassitude. HEENT:   No-  headaches, difficulty swallowing, tooth/dental problems, sore throat,       No-  sneezing, itching, ear ache, +nasal congestion, post nasal drip,  CV:  No-   chest pain, orthopnea, PND, swelling in lower extremities, anasarca,  dizziness, palpitations Resp: + shortness of breath with exertion or at rest.           +   productive cough,  + non-productive cough,  No- coughing up of blood.              No-   change in color of mucus.  + wheezing.   Skin: No-   rash or lesions. GI:  No-   heartburn, indigestion, abdominal pain, nausea, vomiting, + diarrhea GU:  MS:  + joint pain or swelling, + back pain . Neuro-     nothing unusual Psych:  No- change in mood or affect. No depression or anxiety.  No memory loss.  Objective:  OBJ- Physical Exam General- Alert, Oriented, Affect-appropriate, Distress- none acute. + Odor of tobacco,  Skin- rash-none, lesions- none, excoriation- none Lymphadenopathy- none Head- atraumatic  Eyes- Gross vision intact, PERRLA, conjunctivae and secretions clear            Ears- Hearing, canals-normal            Nose- + Turbinate edema, no-Septal dev, mucus, polyps, erosion, perforation             Throat- Mallampati III , mucosa clear , drainage- none, tonsils- atrophic Neck- flexible , trachea midline, no  stridor , thyroid nl, carotid no bruit, + mass left submandibular Chest - symmetrical excursion , unlabored           Heart/CV- RRR , no murmur , no gallop  , no rub, nl s1 s2                           - JVD- none , edema- none, stasis changes- none, varices- none           Lung- + diminished, wheeze- none,  Cough none, dullness-none, rub- none           Chest wall-  Abd-  Br/ Gen/ Rectal- Not done, not indicated Extrem- cyanosis- none, clubbing, none, atrophy- none, strength- nl + Cane Neuro- grossly intact to observation

## 2020-05-13 NOTE — Assessment & Plan Note (Signed)
Benefits from CPAP with good compliance and control Plan- continue auto 5-20 

## 2020-05-13 NOTE — Assessment & Plan Note (Signed)
Plan- updating CXR

## 2020-05-15 ENCOUNTER — Telehealth: Payer: Self-pay | Admitting: Internal Medicine

## 2020-05-15 NOTE — Telephone Encounter (Signed)
Dean Lever, MD  05/13/2020 8:42 PM EDT    CXR- COPD and old scarring. No new concern.    Attempted to call pt but unable to reach. Left message for pt to return call.

## 2020-05-19 NOTE — Telephone Encounter (Signed)
Pt returning a phone call. Pt can be reached at [phone].

## 2020-05-19 NOTE — Telephone Encounter (Signed)
Advised pt of results. Pt understood and nothing further is needed.   

## 2020-05-22 NOTE — Progress Notes (Signed)
Spoke with patient and provided cxr results.  Pt. Verbalized understanding.

## 2020-05-27 DIAGNOSIS — R350 Frequency of micturition: Secondary | ICD-10-CM | POA: Diagnosis not present

## 2020-05-27 DIAGNOSIS — R8279 Other abnormal findings on microbiological examination of urine: Secondary | ICD-10-CM | POA: Diagnosis not present

## 2020-05-27 DIAGNOSIS — N1339 Other hydronephrosis: Secondary | ICD-10-CM | POA: Diagnosis not present

## 2020-05-28 DIAGNOSIS — H5203 Hypermetropia, bilateral: Secondary | ICD-10-CM | POA: Diagnosis not present

## 2020-05-28 DIAGNOSIS — Z961 Presence of intraocular lens: Secondary | ICD-10-CM | POA: Diagnosis not present

## 2020-05-29 DIAGNOSIS — M85852 Other specified disorders of bone density and structure, left thigh: Secondary | ICD-10-CM | POA: Diagnosis not present

## 2020-05-29 DIAGNOSIS — M85851 Other specified disorders of bone density and structure, right thigh: Secondary | ICD-10-CM | POA: Diagnosis not present

## 2020-06-05 ENCOUNTER — Other Ambulatory Visit: Payer: Self-pay | Admitting: Urology

## 2020-06-17 DIAGNOSIS — M1611 Unilateral primary osteoarthritis, right hip: Secondary | ICD-10-CM | POA: Diagnosis not present

## 2020-06-19 ENCOUNTER — Telehealth: Payer: Self-pay

## 2020-06-19 NOTE — Telephone Encounter (Signed)
   [facility] Pre-operative Risk Assessment    Request for surgical clearance:  1. What type of surgery is being performed? TOTAL HIP ARTHROPLASTY ANTERIOR APPROACH  2. When is this surgery scheduled? 07-15-2020   3. What type of clearance is required (medical clearance vs. Pharmacy clearance to hold med vs. Both)? BOTH  4. Are there any medications that need to be held prior to surgery and how long? ASA    5. Practice name and name of physician performing surgery? [facility] Gaynelle Arabian, MD  ATTN:KELLY  6. What is the office phone number?  [phone]   7.   What is the office fax number? [phone]  8.   Anesthesia type (None, local, MAC, general) ? Choice   Waylan Rocher 06/19/2020, 2:27 PM  _________________________________________________________________   (provider comments below)

## 2020-06-19 NOTE — Telephone Encounter (Signed)
not needed °

## 2020-06-19 NOTE — Telephone Encounter (Signed)
   Primary Cardiologist: Pixie Casino, MD  Chart reviewed as part of pre-operative protocol coverage. Given past medical history and time since last visit, based on ACC/AHA guidelines, [name] would be at acceptable risk for the planned procedure without further cardiovascular testing.   He may hold his aspirin for 7 days prior to his surgery.  Please resume as soon as hemostasis is achieved.  I will route this recommendation to the requesting party via Epic fax function and remove from pre-op pool.  Please call with questions.  Jossie Ng. Mckinleigh Schuchart NP-C    06/19/2020, 2:50 PM [facility] [place] [address] Office [phone] Fax [phone]

## 2020-06-29 ENCOUNTER — Other Ambulatory Visit: Payer: Self-pay

## 2020-06-29 ENCOUNTER — Encounter (HOSPITAL_BASED_OUTPATIENT_CLINIC_OR_DEPARTMENT_OTHER): Payer: Self-pay | Admitting: Urology

## 2020-06-29 NOTE — Progress Notes (Addendum)
Addendum: spoke with angela kabbe np ok to proceed  Spoke w/ via phone for pre-op interview---PT Lab needs dos---- I STAT 8             COVID test ------NOT NEEDED FULLY VACCINATED Arrive at -------1130 AM 07-07-20 No food after midnight clear liquids from midnight until 1030 am then npo Medications to take morning of surgery -----albuterol inhaler, albuterol nebulizer, diltiazem, nexium Diabetic medication -----n/a Patient Special Instructions -----bring covid vaccination card day of surgery and bring cpap mask tubing and machine and leave in car Pre-Op special Istructions -----none Patient verbalized understanding of instructions that were given at this phone interview. Patient denies shortness of breath, chest pain, fever, cough a this phone interview.  Anesthesia : [facility] dos 07-07-2020, last [facility] surgery 04-14-2020 with out problems, cardiac history, patient stated he stopped breathing with cholecystectomy many yrs ago, but no records could be found (patient thinks [facility]), asthma, copd osa with cpap used patient denies cardiac S & S and sob with exertion at pre op phone call. Patient with limited mobility having right  hip replacement 07-15-20 with dr Wynelle Link and has cardiac clearance note in epic 06-19-20 jesse cleaver np for that surgery, has instructions to stop 325 mg aspirin 5 days before surgery per dr Junious Silk  Chart to Janett Billow zanetto pa for review  PCP: dr [name] Cardiologist : dr Clayton Lefort hao meng pa 03-18-2020 epic lov dr young pulmonary 05-13-2020 epic Chest x-ray :04-23-2020 epic EKG :08-14-2019 epic Echo : 08-23-2019 epic Cardiac Cath :  1994 Activity level: limited mobility due to arthritis his issues, does house work, uses stair lift for stairs due to limted mobility with oa Sleep Study/ CPAP : uses cpap auto set 5 to 20, last sleep study 4 yrs ago home sleep study Fasting Blood Sugar :      / Checks Blood Sugar -- times a day:  n/a Blood Thinner/  Instructions /Last Dose:n/a ASA / Instructions/ Last Dose : pt to stop 325 mg aspirin 5 days before surgery per dr Junious Silk

## 2020-06-29 NOTE — Progress Notes (Signed)
NEW Covid Policy July 2080  Surgery Day:  07-07-20  Facility:  [facility]  Type of Surgery: cysto with right stent change  Fully Covid Vaccinated:   1)01-31-2020                                          2)02-24-2020                                          Where? [place]                                            Type?pfizer Not Covid Vaccinated:  n/a  Do you have symptoms? no  In the past 14 days:        Have you had any symptoms? no       Have you been tested covid positive? no       Have you been in contact with someone covid positive?no        Is pt Immuno-compromised?no  Patient aware ti bring covid vaccinaiton card day of surgery

## 2020-07-01 NOTE — Progress Notes (Addendum)
Anesthesia Chart Review:  Pt is a same day work up   Case: [id number] Date/Time: 07/15/20 0715   Procedure: TOTAL HIP ARTHROPLASTY ANTERIOR APPROACH (Right Hip) - 168min   Anesthesia type: Choice   Pre-op diagnosis: right hip osteoarthritis   Location: [facility] ROOM 10 / [facility]   Surgeons: Gaynelle Arabian, MD      DISCUSSION: Pt is a 74 year old with hx CAD (s/p stenting 1994), HTN, PAD (s/p  Bilateral common iliac artery stents and FFBG), AAA (infrarenal 3.1 x 2.8 cm by 02/26/20 CT), OSA, COPD  Afib listed in hx but no evidence for this found in Epic/cardiology notes.   Pt has reported he "stopped breathing" after gallbladder surgery; another time he has reported he "woke up" during gallbladder surgery.  I have no records.    PROVIDERS: - PCP is [name], MD - Cardiologist is Lyman Bishop, MD. Last virtual office visit 03/18/20 with Almyra Deforest, [place].  Pt cleared for surgery 06/19/20 by Coletta Memos, NP  LABS: Will be obtained day of surgery    IMAGES: CXR 05/13/20:  - Changes of COPD and mild chronic interstitial lung disease. - No acute abnormalities.  CT abd/pelvis 02/26/20:  1. Since the prior study, there has been interval placement of a right-sided nephroureteral stent which appears appropriately positioned. Nearly resolved right-sided hydronephrosis with mild prominence of the right renal pelvis. There is air present within the right renal pelvis and renal calices, which may be related to recent instrumentation. Correlate with urinalysis to exclude infection. 2. Severe degenerative changes of the right hip with extensive subchondral sclerosis and remodeling of the femoral head and acetabulum. Findings suspicious for a subchondral fracture of the superior aspect of the right femoral head. 3. Extensive colonic diverticulosis. Minimal residual induration within the fat adjacent to the proximal sigmoid colon at site of previously seen diverticulitis. No definite new pericolonic  inflammatory changes to suggest acute diverticulitis. 4. Subtle geographic area of sclerosis of the left femoral head compatible with avascular necrosis. 5. Infrarenal abdominal aortic aneurysm measures 3.1 x 2.8 cm, not significantly changed from prior study 12/19/2019 when remeasured in a similar plane. 6. Bilateral common iliac artery stents and prior femoral-femoral bypass graft, incompletely evaluated on this non contrasted study.   EKG 08/14/19:  Sinus rhythm with PVCs, incomplete right bundle branch block, prolonged QT interval.   CV: Echo 08/23/19:  1. The left ventricle has hyperdynamic systolic function, with an ejection fraction of >65%. The cavity size was normal. There is mild concentric left ventricular hypertrophy. Left ventricular diastolic Doppler parameters are consistent with impaired relaxation.  2. The right ventricle has normal systolic function. The cavity was normal. There is no increase in right ventricular wall thickness. Right ventricular systolic pressure could not be assessed.  3. The aortic valve is tricuspid. Mild thickening of the aortic valve. Mild calcification of the aortic valve.  4. The aorta is normal unless otherwise noted.    Nuclear stress test 12/31/14:  - Low risk stress nuclear study with small, mild fixed inferoapical defect, likely artifact or small area of scar. The study was non-gated due to ectopy.    Past Medical History:  Diagnosis Date  . AAA (abdominal aortic aneurysm) (HCC)    infrarenal aaa 3.1 x 2.8 per abd/pelvis ct 02-26-20 epic  . Allergy    pollen  . Arthritis   . Asthma   . Atrial fibrillation ([place])    no documentation in epic/ pt states has never been told  has A Fib  . CAD (coronary artery disease)    s/p angioplasty and stenting x3 1994, pt states has this done  . Cataract    small, mild  . Chronic kidney disease    kidney cyst  . Complication of anesthesia    woke up during gall bladder sx yrs ago and with colonscopy   . Complication of anesthesia    pt thinks stopped breathing after gallbladder sx years ago, no record found  . COPD (chronic obstructive pulmonary disease) ([place])   . GERD (gastroesophageal reflux disease)    hx of  . History of kidney stones   . History of prediabetes    none in last 8 months since 40 lb weight loss  . Hyperlipidemia   . Hypertension   . Lower extremity edema since 02-14-2020   swelling goes down when sleeps resolved 06-29-20  . Neuromuscular disorder ([place])    nerve issues with back   . Peripheral vascular disease ([place])   . PVC's (premature ventricular contractions)   . Sleep apnea    wears cpap auto set 5 to 20    Past Surgical History:  Procedure Laterality Date  . APPENDECTOMY    . CHOLECYSTECTOMY  1990s   Gall Bladder  . COLONOSCOPY    . CORONARY ANGIOPLASTY WITH STENT PLACEMENT  1994  . CYSTOSCOPY WITH INSERTION OF UROLIFT N/A 04/14/2020   Procedure: CYSTOSCOPY WITH INSERTION OF UROLIFT;  Surgeon: Festus Aloe, MD;  [facility];  Service: Urology;  Laterality: N/A;  . CYSTOSCOPY WITH URETEROSCOPY AND STENT PLACEMENT Bilateral 01/03/2020   Procedure: CYSTOSCOPY WITH RETROGRADE/ URETEROSCOPY AND RIGHT STENT PLACEMENT BLADDER BIOPSY;  Surgeon: Festus Aloe, MD;  Location[facility];  Service: Urology;  Laterality: Bilateral;  ONLY NEEDS 30 MIN  . CYSTOSCOPY WITH URETEROSCOPY AND STENT PLACEMENT Right 04/14/2020   Procedure: CYSTOSCOPY WITH URETEROSCOPY, RETROGRADE  AND STENT REPLACEMENT;  Surgeon: Festus Aloe, MD;  Location[facility];  Service: Urology;  Laterality: Right;  . EYE SURGERY Bilateral 2019   cataracts  . lumbar disectomy  2005   L3 - 4 - 5  . POLYPECTOMY    . PR VEIN BYPASS GRAFT,AORTO-FEM-POP  08/31/11   Right to Left Fem-Fem  . RENAL ANGIOGRAPHY N/A 02/14/2020   Procedure: RENAL ANGIOGRAPHY;  Surgeon: Elam Dutch, MD;  Location: [facility];  Service:  Cardiovascular;  Laterality: N/A;  Bilateral  . TONSILLECTOMY  as child  . UMBILICAL HERNIA REPAIR     x 2    MEDICATIONS: No current facility-administered medications for this encounter.   Marland Kitchen albuterol (PROVENTIL HFA) 108 (90 Base) MCG/ACT inhaler  . albuterol (PROVENTIL) (2.5 MG/3ML) 0.083% nebulizer solution  . antiseptic oral rinse (BIOTENE) LIQD  . aspirin 325 MG tablet  . atorvastatin (LIPITOR) 10 MG tablet  . Azelastine-Fluticasone 137-50 MCG/ACT SUSP  . diltiazem (TIAZAC) 240 MG 24 hr capsule  . docusate sodium (COLACE) 100 MG capsule  . esomeprazole (NEXIUM) 40 MG capsule  . finasteride (PROSCAR) 5 MG tablet  . hydroxypropyl methylcellulose / hypromellose (ISOPTO TEARS / GONIOVISC) 2.5 % ophthalmic solution  . irbesartan (AVAPRO) 150 MG tablet  . isosorbide mononitrate (IMDUR) 60 MG 24 hr tablet  . loperamide (IMODIUM A-D) 2 MG tablet  . Magnesium 400 MG TABS  . meloxicam (MOBIC) 15 MG tablet  . Menthol, Topical Analgesic, (BLUE-EMU MAXIMUM STRENGTH EX)  . simethicone (MYLICON) 160 MG chewable tablet  . tamsulosin (FLOMAX) 0.4 MG CAPS  capsule  . traMADol (ULTRAM) 50 MG tablet  . Vitamin D, Ergocalciferol, (DRISDOL) 50000 UNITS CAPS  . Budeson-Glycopyrrol-Formoterol (BREZTRI AEROSPHERE) 160-9-4.8 MCG/ACT AERO  . nitrofurantoin (MACRODANTIN) 100 MG capsule  . pseudoephedrine-acetaminophen (TYLENOL SINUS) 30-500 MG TABS tablet    If labs acceptable day of surgery, I anticipate pt can proceed with surgery as scheduled.  Willeen Cass, PhD, FNP-BC [facility]/Anesthesiology Phone: [phone] 07/01/2020 1:13 PM

## 2020-07-01 NOTE — Anesthesia Preprocedure Evaluation (Deleted)
Anesthesia Evaluation  ? ? ?Airway ? ? ? ? ? ? ? Dental ?  ?Pulmonary ?Current Smoker,  ?  ? ? ? ? ? ? ? Cardiovascular ?hypertension,  ? ? ?  ?Neuro/Psych ?  ? GI/Hepatic ?  ?Endo/Other  ? ? Renal/GU ?  ? ?  ?Musculoskeletal ? ? Abdominal ?  ?Peds ? Hematology ?  ?Anesthesia Other Findings ? ? Reproductive/Obstetrics ? ?  ? ? ? ? ? ? ? ? ? ? ? ? ? ?  ?  ? ? ? ? ? ? ? ? ?Anesthesia Physical ?Anesthesia Plan ? ?ASA:  ? ?Anesthesia Plan:   ? ?Post-op Pain Management:   ? ?Induction:  ? ?PONV Risk Score and Plan:  ? ?Airway Management Planned:  ? ?Additional Equipment:  ? ?Intra-op Plan:  ? ?Post-operative Plan:  ? ?Informed Consent:  ? ?Plan Discussed with:  ? ?Anesthesia Plan Comments: (See APP note by A. Kabbe, FNP )  ? ? ? ? ? ? ?Anesthesia Quick Evaluation ? ?

## 2020-07-02 NOTE — Progress Notes (Signed)
covid test scheduled 07-04-20 1200 pm

## 2020-07-04 ENCOUNTER — Other Ambulatory Visit (HOSPITAL_COMMUNITY)
Admission: RE | Admit: 2020-07-04 | Discharge: 2020-07-04 | Disposition: A | Discharge status: Home / Self Care | Payer: Medicare PPO | Source: Ambulatory Visit | Attending: Urology | Admitting: Urology

## 2020-07-04 DIAGNOSIS — Z20822 Contact with and (suspected) exposure to covid-19: Secondary | ICD-10-CM | POA: Insufficient documentation

## 2020-07-04 DIAGNOSIS — Z01812 Encounter for preprocedural laboratory examination: Secondary | ICD-10-CM | POA: Insufficient documentation

## 2020-07-04 LAB — SARS CORONAVIRUS 2 (TAT 6-24 HRS): SARS Coronavirus 2: NEGATIVE

## 2020-07-06 NOTE — Anesthesia Preprocedure Evaluation (Addendum)
Anesthesia Evaluation  Patient identified by MRN, date of birth, ID band Patient awake    Reviewed: Allergy & Precautions, NPO status , Patient's Chart, lab work & pertinent test results  History of Anesthesia Complications (+) AWARENESS UNDER ANESTHESIA  Airway Mallampati: II  TM Distance: >3 FB Neck ROM: Full    Dental no notable dental hx. (+) Teeth Intact, Dental Advisory Given   Pulmonary asthma , sleep apnea , COPD,  COPD inhaler, Current Smoker,    Pulmonary exam normal breath sounds clear to auscultation       Cardiovascular hypertension, Pt. on medications + CAD, + Cardiac Stents and + Peripheral Vascular Disease  Normal cardiovascular exam Rhythm:Regular Rate:Normal  Echo 08/23/19 Left Ventricle: The left ventricle has hyperdynamic systolic function,  with an ejection fraction of >65%. The cavity size was normal. There is  mild concentric left ventricular hypertrophy. Left ventricular diastolic  Doppler parameters are consistent  with impaired relaxation. Normal left ventricular filling pressures    Neuro/Psych    GI/Hepatic GERD  ,  Endo/Other    Renal/GU Renal disease     Musculoskeletal  (+) Arthritis ,   Abdominal   Peds  Hematology   Anesthesia Other Findings   Reproductive/Obstetrics                            Anesthesia Physical Anesthesia Plan  ASA: III  Anesthesia Plan: General   Post-op Pain Management:    Induction: Intravenous  PONV Risk Score and Plan: Treatment may vary due to age or medical condition, Ondansetron and Dexamethasone  Airway Management Planned: LMA  Additional Equipment: None  Intra-op Plan:   Post-operative Plan:   Informed Consent: I have reviewed the patients History and Physical, chart, labs and discussed the procedure including the risks, benefits and alternatives for the proposed anesthesia with the patient or authorized  representative who has indicated his/her understanding and acceptance.     Dental advisory given  Plan Discussed with:   Anesthesia Plan Comments:        Anesthesia Quick Evaluation

## 2020-07-07 ENCOUNTER — Encounter (HOSPITAL_BASED_OUTPATIENT_CLINIC_OR_DEPARTMENT_OTHER)
Admission: RE | Disposition: A | Discharge status: Home / Self Care | Payer: Self-pay | Source: Home / Self Care | Attending: Urology

## 2020-07-07 ENCOUNTER — Encounter (HOSPITAL_COMMUNITY): Admission: RE | Admit: 2020-07-07 | Payer: Medicare PPO | Source: Ambulatory Visit

## 2020-07-07 ENCOUNTER — Encounter (HOSPITAL_BASED_OUTPATIENT_CLINIC_OR_DEPARTMENT_OTHER): Payer: Self-pay | Admitting: Urology

## 2020-07-07 ENCOUNTER — Ambulatory Visit (HOSPITAL_BASED_OUTPATIENT_CLINIC_OR_DEPARTMENT_OTHER): Payer: Medicare PPO | Admitting: Anesthesiology

## 2020-07-07 ENCOUNTER — Other Ambulatory Visit: Payer: Self-pay

## 2020-07-07 ENCOUNTER — Ambulatory Visit (HOSPITAL_BASED_OUTPATIENT_CLINIC_OR_DEPARTMENT_OTHER)
Admission: RE | Admit: 2020-07-07 | Discharge: 2020-07-07 | Disposition: A | Discharge status: Home / Self Care | Payer: Medicare PPO | Attending: Urology | Admitting: Urology

## 2020-07-07 DIAGNOSIS — N135 Crossing vessel and stricture of ureter without hydronephrosis: Secondary | ICD-10-CM | POA: Insufficient documentation

## 2020-07-07 DIAGNOSIS — Z8249 Family history of ischemic heart disease and other diseases of the circulatory system: Secondary | ICD-10-CM | POA: Diagnosis not present

## 2020-07-07 DIAGNOSIS — I2510 Atherosclerotic heart disease of native coronary artery without angina pectoris: Secondary | ICD-10-CM | POA: Insufficient documentation

## 2020-07-07 DIAGNOSIS — Z91013 Allergy to seafood: Secondary | ICD-10-CM | POA: Insufficient documentation

## 2020-07-07 DIAGNOSIS — Z880 Allergy status to penicillin: Secondary | ICD-10-CM | POA: Insufficient documentation

## 2020-07-07 DIAGNOSIS — I129 Hypertensive chronic kidney disease with stage 1 through stage 4 chronic kidney disease, or unspecified chronic kidney disease: Secondary | ICD-10-CM | POA: Insufficient documentation

## 2020-07-07 DIAGNOSIS — I739 Peripheral vascular disease, unspecified: Secondary | ICD-10-CM | POA: Insufficient documentation

## 2020-07-07 DIAGNOSIS — Z955 Presence of coronary angioplasty implant and graft: Secondary | ICD-10-CM | POA: Diagnosis not present

## 2020-07-07 DIAGNOSIS — Z791 Long term (current) use of non-steroidal anti-inflammatories (NSAID): Secondary | ICD-10-CM | POA: Insufficient documentation

## 2020-07-07 DIAGNOSIS — N130 Hydronephrosis with ureteropelvic junction obstruction: Secondary | ICD-10-CM | POA: Diagnosis not present

## 2020-07-07 DIAGNOSIS — I10 Essential (primary) hypertension: Secondary | ICD-10-CM | POA: Diagnosis not present

## 2020-07-07 DIAGNOSIS — Z87442 Personal history of urinary calculi: Secondary | ICD-10-CM | POA: Insufficient documentation

## 2020-07-07 DIAGNOSIS — F17210 Nicotine dependence, cigarettes, uncomplicated: Secondary | ICD-10-CM | POA: Diagnosis not present

## 2020-07-07 DIAGNOSIS — Z79899 Other long term (current) drug therapy: Secondary | ICD-10-CM | POA: Diagnosis not present

## 2020-07-07 DIAGNOSIS — N189 Chronic kidney disease, unspecified: Secondary | ICD-10-CM | POA: Diagnosis not present

## 2020-07-07 DIAGNOSIS — J449 Chronic obstructive pulmonary disease, unspecified: Secondary | ICD-10-CM | POA: Insufficient documentation

## 2020-07-07 DIAGNOSIS — G4730 Sleep apnea, unspecified: Secondary | ICD-10-CM | POA: Diagnosis not present

## 2020-07-07 DIAGNOSIS — E785 Hyperlipidemia, unspecified: Secondary | ICD-10-CM | POA: Diagnosis not present

## 2020-07-07 DIAGNOSIS — Z7982 Long term (current) use of aspirin: Secondary | ICD-10-CM | POA: Diagnosis not present

## 2020-07-07 DIAGNOSIS — K219 Gastro-esophageal reflux disease without esophagitis: Secondary | ICD-10-CM | POA: Diagnosis not present

## 2020-07-07 DIAGNOSIS — N132 Hydronephrosis with renal and ureteral calculous obstruction: Secondary | ICD-10-CM | POA: Diagnosis not present

## 2020-07-07 DIAGNOSIS — M1990 Unspecified osteoarthritis, unspecified site: Secondary | ICD-10-CM | POA: Diagnosis not present

## 2020-07-07 HISTORY — PX: CYSTOSCOPY WITH STENT PLACEMENT: SHX5790

## 2020-07-07 LAB — POCT I-STAT, CHEM 8
BUN: 15 mg/dL (ref 8–23)
Calcium, Ion: 1.21 mmol/L (ref 1.15–1.40)
Chloride: 101 mmol/L (ref 98–111)
Creatinine, Ser: 1.6 mg/dL — ABNORMAL HIGH (ref 0.61–1.24)
Glucose, Bld: 76 mg/dL (ref 70–99)
HCT: 41 % (ref 39.0–52.0)
Hemoglobin: 13.9 g/dL (ref 13.0–17.0)
Potassium: 3.4 mmol/L — ABNORMAL LOW (ref 3.5–5.1)
Sodium: 143 mmol/L (ref 135–145)
TCO2: 28 mmol/L (ref 22–32)

## 2020-07-07 SURGERY — CYSTOSCOPY, WITH STENT INSERTION
Anesthesia: General | Site: Ureter | Laterality: Right

## 2020-07-07 MED ORDER — PROPOFOL 10 MG/ML IV BOLUS
INTRAVENOUS | Status: DC | PRN
  Administered 2020-07-07: 130 mg via INTRAVENOUS

## 2020-07-07 MED ORDER — EPHEDRINE SULFATE-NACL 50-0.9 MG/10ML-% IV SOSY
PREFILLED_SYRINGE | INTRAVENOUS | Status: DC | PRN
  Administered 2020-07-07 (×2): 10 mg via INTRAVENOUS

## 2020-07-07 MED ORDER — CIPROFLOXACIN IN D5W 400 MG/200ML IV SOLN
INTRAVENOUS | Status: AC
  Filled 2020-07-07: qty 200

## 2020-07-07 MED ORDER — ONDANSETRON HCL 4 MG/2ML IJ SOLN
INTRAMUSCULAR | Status: DC | PRN
  Administered 2020-07-07: 4 mg via INTRAVENOUS

## 2020-07-07 MED ORDER — ONDANSETRON HCL 4 MG/2ML IJ SOLN: 4.0000 mg | Freq: Once | INTRAMUSCULAR | Status: DC | PRN

## 2020-07-07 MED ORDER — DEXAMETHASONE SODIUM PHOSPHATE 4 MG/ML IJ SOLN
INTRAMUSCULAR | Status: DC | PRN
  Administered 2020-07-07: 10 mg via INTRAVENOUS

## 2020-07-07 MED ORDER — LIDOCAINE 2% (20 MG/ML) 5 ML SYRINGE
INTRAMUSCULAR | Status: AC
  Filled 2020-07-07: qty 5

## 2020-07-07 MED ORDER — NITROFURANTOIN MACROCRYSTAL 50 MG PO CAPS: 50.0000 mg | ORAL_CAPSULE | Freq: Every day | ORAL | 0 refills | Status: DC

## 2020-07-07 MED ORDER — FENTANYL CITRATE (PF) 100 MCG/2ML IJ SOLN
INTRAMUSCULAR | Status: DC | PRN
  Administered 2020-07-07: 50 ug via INTRAVENOUS

## 2020-07-07 MED ORDER — FENTANYL CITRATE (PF) 100 MCG/2ML IJ SOLN
INTRAMUSCULAR | Status: AC
  Filled 2020-07-07: qty 2

## 2020-07-07 MED ORDER — LACTATED RINGERS IV SOLN: INTRAVENOUS | Status: DC

## 2020-07-07 MED ORDER — CIPROFLOXACIN IN D5W 400 MG/200ML IV SOLN
400.0000 mg | Freq: Once | INTRAVENOUS | Status: AC
  Administered 2020-07-07: 400 mg via INTRAVENOUS

## 2020-07-07 MED ORDER — SODIUM CHLORIDE 0.9 % IR SOLN
Status: DC | PRN
  Administered 2020-07-07: 1200 mL via INTRAVESICAL

## 2020-07-07 MED ORDER — FENTANYL CITRATE (PF) 100 MCG/2ML IJ SOLN: 25.0000 ug | INTRAMUSCULAR | Status: DC | PRN

## 2020-07-07 MED ORDER — ONDANSETRON HCL 4 MG/2ML IJ SOLN
INTRAMUSCULAR | Status: AC
  Filled 2020-07-07: qty 2

## 2020-07-07 MED ORDER — ACETAMINOPHEN 10 MG/ML IV SOLN: 1000.0000 mg | Freq: Once | INTRAVENOUS | Status: DC | PRN

## 2020-07-07 MED ORDER — DEXAMETHASONE SODIUM PHOSPHATE 10 MG/ML IJ SOLN
INTRAMUSCULAR | Status: AC
  Filled 2020-07-07: qty 1

## 2020-07-07 MED ORDER — LIDOCAINE 2% (20 MG/ML) 5 ML SYRINGE
INTRAMUSCULAR | Status: DC | PRN
  Administered 2020-07-07: 60 mg via INTRAVENOUS

## 2020-07-07 SURGICAL SUPPLY — 22 items
BAG DRAIN URO-CYSTO SKYTR STRL (DRAIN) ×3 IMPLANT
BAG DRN UROCATH (DRAIN) ×1
CATH URET 5FR 28IN CONE TIP (BALLOONS)
CATH URET 5FR 28IN OPEN ENDED (CATHETERS) IMPLANT
CATH URET 5FR 70CM CONE TIP (BALLOONS) IMPLANT
CATH URET DUAL LUMEN 6-10FR 50 (CATHETERS) IMPLANT
CLOTH BEACON ORANGE TIMEOUT ST (SAFETY) ×3 IMPLANT
GLOVE BIO SURGEON STRL SZ7.5 (GLOVE) ×3 IMPLANT
GLOVE BIO SURGEON STRL SZ8 (GLOVE) IMPLANT
GOWN STRL REUS W/TWL LRG LVL3 (GOWN DISPOSABLE) ×3 IMPLANT
GUIDEWIRE ANG ZIPWIRE 038X150 (WIRE) IMPLANT
GUIDEWIRE STR DUAL SENSOR (WIRE) ×3 IMPLANT
GUIDEWIRE ZIPWRE .038 STRAIGHT (WIRE) IMPLANT
IV NS IRRIG 3000ML ARTHROMATIC (IV SOLUTION) ×4 IMPLANT
KIT TURNOVER CYSTO (KITS) ×3 IMPLANT
MANIFOLD NEPTUNE II (INSTRUMENTS) ×3 IMPLANT
NS IRRIG 500ML POUR BTL (IV SOLUTION) ×1 IMPLANT
PACK CYSTO (CUSTOM PROCEDURE TRAY) ×3 IMPLANT
STENT URET 6FRX24 CONTOUR (STENTS) ×2 IMPLANT
TUBE CONNECTING 12'X1/4 (SUCTIONS) ×1
TUBE CONNECTING 12X1/4 (SUCTIONS) ×2 IMPLANT
TUBING UROLOGY SET (TUBING) ×1 IMPLANT

## 2020-07-07 NOTE — Op Note (Signed)
Preoperative diagnosis: Right UPJ obstruction Postoperative diagnosis: Same  Procedure: Cystoscopy with right ureteral stent exchange  Surgeon: Junious Silk  Anesthesia: General  Indication procedure 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  He underwent a right stent and is due for stent change.  Findings: Right stent in good position with some stone developing on the bladder coil and about a fourth of the way up the stent.  Excellent channel from prior UroLift.  Urine was clear.  Description of procedure: After consent was obtained patient was brought to the operating room.  After adequate anesthesia he was placed in lithotomy position and prepped and draped in the usual sterile fashion.  Timeout was performed to confirm the patient and procedure.  Cystoscope was passed per urethra and the bladder carefully inspected.  The right ureteral stent had some crust on it and therefore I passed a sensor wire along the stent and coiled that in the collecting system.  I then backed the scope out and went beside the wire.  The right stent was grasped and removed without difficulty.  The wire was backloaded on the cystoscope and a new 6 x 24 cm stent advanced.  The wire was removed with a good coil seen in a midpole or upper pole calyx and a good coil in the bladder.  The bladder was drained and the scope removed.  He was awakened and taken recovery room in stable condition.  Complications: None  Blood loss: Minimal  Specimens: None  Drains: 6 x 24 cm right ureteral stent  Disposition: Patient stable to PACU

## 2020-07-07 NOTE — H&P (Signed)
H&P  Chief Complaint: Right UPJ obstruction  History of Present Illness: [name] is a 74 year old male who had a rising creatinine last fall.  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  Follow-up ureteroscopy and stenting also consistent with right UPJ obstruction.  He currently has right ureteral stent and has upcoming right hip replacement.  We thought it was prudent to go ahead and change the stent to put in a clean 1 which will have less risk of bacterial colonization and keep him on a nightly antibiotic.  He has been well.  He has no dysuria or gross hematuria.  His last office culture grew mixed growth and the one prior to that grew staph sensitive to Cipro.  In the long run he do well with a right pyeloplasty, but again has right hip replacement and recovery coming up.  Past Medical History:  Diagnosis Date  . AAA (abdominal aortic aneurysm) (HCC)    infrarenal aaa 3.1 x 2.8 per abd/pelvis ct 02-26-20 epic  . Allergy    pollen  . Arthritis   . Asthma   . Atrial fibrillation ([place])    no documentation in epic/ pt states has never been told has A Fib  . CAD (coronary artery disease)    s/p angioplasty and stenting x3 1994, pt states has this done  . Cataract    small, mild  . Chronic kidney disease    kidney cyst  . Complication of anesthesia    woke up during gall bladder sx yrs ago and with colonscopy  . Complication of anesthesia    pt thinks stopped breathing after gallbladder sx years ago, no record found  . COPD (chronic obstructive pulmonary disease) ([place])   . GERD (gastroesophageal reflux disease)    hx of  . History of kidney stones   . History of prediabetes    none in last 8 months since 40 lb weight loss  . Hyperlipidemia   . Hypertension   . Lower extremity edema since 02-14-2020   swelling goes down when sleeps resolved 06-29-20  . Neuromuscular disorder ([place])    nerve issues with back   . Peripheral  vascular disease ([place])   . PVC's (premature ventricular contractions)   . Sleep apnea    wears cpap auto set 5 to 20   Past Surgical History:  Procedure Laterality Date  . APPENDECTOMY    . CHOLECYSTECTOMY  1990s   Gall Bladder  . COLONOSCOPY    . CORONARY ANGIOPLASTY WITH STENT PLACEMENT  1994  . CYSTOSCOPY WITH INSERTION OF UROLIFT N/A 04/14/2020   Procedure: CYSTOSCOPY WITH INSERTION OF UROLIFT;  Surgeon: Festus Aloe, MD;  Location: [facility];  Service: Urology;  Laterality: N/A;  . CYSTOSCOPY WITH URETEROSCOPY AND STENT PLACEMENT Bilateral 01/03/2020   Procedure: CYSTOSCOPY WITH RETROGRADE/ URETEROSCOPY AND RIGHT STENT PLACEMENT BLADDER BIOPSY;  Surgeon: Festus Aloe, MD;  Location: [facility];  Service: Urology;  Laterality: Bilateral;  ONLY NEEDS 30 MIN  . CYSTOSCOPY WITH URETEROSCOPY AND STENT PLACEMENT Right 04/14/2020   Procedure: CYSTOSCOPY WITH URETEROSCOPY, RETROGRADE  AND STENT REPLACEMENT;  Surgeon: Festus Aloe, MD;  Location: [facility];  Service: Urology;  Laterality: Right;  . EYE SURGERY Bilateral 2019   cataracts  . lumbar disectomy  2005   L3 - 4 - 5  . POLYPECTOMY    . PR VEIN BYPASS GRAFT,AORTO-FEM-POP  08/31/11   Right to Left Fem-Fem  . RENAL  ANGIOGRAPHY N/A 02/14/2020   Procedure: RENAL ANGIOGRAPHY;  Surgeon: Elam Dutch, MD;  Location: [facility];  Service: Cardiovascular;  Laterality: N/A;  Bilateral  . TONSILLECTOMY  as child  . TONSILLECTOMY    . UMBILICAL HERNIA REPAIR     x 2    Home Medications:  Medications Prior to Admission  Medication Sig Dispense Refill Last Dose  . albuterol (PROVENTIL HFA) 108 (90 Base) MCG/ACT inhaler Inhale 1 puff into the lungs 2 (two) times daily. 8 g 5 07/07/2020 at 0630  . albuterol (PROVENTIL) (2.5 MG/3ML) 0.083% nebulizer solution USE 1 VIAL VIA NEBULIZER EVERY 6 HOURS AS NEEDED FOR WHEEZING OR SHORTNESS OF BREATH (Patient taking differently:  Take 2.5 mg by nebulization every 6 (six) hours as needed for wheezing. ) 225 mL 12 07/07/2020 at 0630  . antiseptic oral rinse (BIOTENE) LIQD 15 mLs by Mouth Rinse route as needed for dry mouth.   07/07/2020 at 0630  . aspirin 325 MG tablet Take 325 mg by mouth daily with supper.    07/02/2020 at Unknown time  . atorvastatin (LIPITOR) 10 MG tablet Take 10 mg by mouth every evening.    07/06/2020 at 2345  . Azelastine-Fluticasone 137-50 MCG/ACT SUSP PLACE 1-2 SPRAYS INTO EACH NOSTRIL AT BEDTIME (Patient taking differently: Place 1-2 sprays into both nostrils 2 (two) times daily as needed (congestion.). ) 23 g 3 Past Week at Unknown time  . diltiazem (TIAZAC) 240 MG 24 hr capsule Take 240 mg by mouth daily.    07/07/2020 at 0630  . docusate sodium (COLACE) 100 MG capsule Take 100 mg by mouth daily as needed for mild constipation.   Past Week at Unknown time  . esomeprazole (NEXIUM) 40 MG capsule Take 40 mg by mouth 2 (two) times daily as needed (acid reflux/indigestion.).    07/07/2020 at 0630  . finasteride (PROSCAR) 5 MG tablet Take 5 mg by mouth at bedtime.    07/06/2020 at 2345  . hydroxypropyl methylcellulose / hypromellose (ISOPTO TEARS / GONIOVISC) 2.5 % ophthalmic solution Place 1 drop into both eyes 3 (three) times daily as needed for dry eyes.   Past Month at Unknown time  . irbesartan (AVAPRO) 150 MG tablet Take 150 mg by mouth every evening.    07/06/2020 at 2345  . isosorbide mononitrate (IMDUR) 60 MG 24 hr tablet Take 60 mg by mouth every evening.    07/06/2020 at 2345  . loperamide (IMODIUM A-D) 2 MG tablet Take 2 mg by mouth 4 (four) times daily as needed for diarrhea or loose stools.   Past Week at Unknown time  . Magnesium 400 MG TABS Take 400 mg by mouth every other day.    Past Week at Unknown time  . meloxicam (MOBIC) 15 MG tablet Take 15 mg by mouth daily as needed for pain.   Past Month at Unknown time  . Menthol, Topical Analgesic, (BLUE-EMU MAXIMUM STRENGTH EX) Apply 1 application  topically 3 (three) times daily as needed (pain.).   Past Week at Unknown time  . simethicone (MYLICON) 732 MG chewable tablet Chew 125 mg by mouth every 6 (six) hours as needed for flatulence. Takes gas x   Past Week at Unknown time  . tamsulosin (FLOMAX) 0.4 MG CAPS capsule Take 0.4 mg by mouth at bedtime.    07/06/2020 at 2345  . traMADol (ULTRAM) 50 MG tablet Take 50 mg by mouth every 6 (six) hours as needed for pain.   Past Month at Unknown  time  . Vitamin D, Ergocalciferol, (DRISDOL) 50000 UNITS CAPS Take 50,000 Units by mouth every Friday at 6 PM.    Past Month at Unknown time  . Budeson-Glycopyrrol-Formoterol (BREZTRI AEROSPHERE) 160-9-4.8 MCG/ACT AERO Inhale 2 puffs into the lungs in the morning and at bedtime. (Patient not taking: Reported on 06/24/2020) 2 g 0 More than a month at Unknown time  . nitrofurantoin (MACRODANTIN) 100 MG capsule  (Patient not taking: Reported on 06/24/2020)     . pseudoephedrine-acetaminophen (TYLENOL SINUS) 30-500 MG TABS tablet Take 1 tablet by mouth every 4 (four) hours as needed (sinus congestion). (Patient not taking: Reported on 06/24/2020)   More than a month at Unknown time   Allergies:  Allergies  Allergen Reactions  . Penicillins     Childhood reaction  REACTION: rapid pulse Did it involve swelling of the face/tongue/throat, SOB, or low BP? Yes Did it involve sudden or severe rash/hives, skin peeling, or any reaction on the inside of your mouth or nose? Unknown Did you need to seek medical attention at a hospital or doctor's office? yes When did it last happen?childhood  If all above answers are "NO", may proceed with cephalosporin use.   . Other     Oily seafood salmon-- chest pain  . Mold Extract [Trichophyton] Rash    Family History  Problem Relation Age of Onset  . Other Father        ETOH  . Diabetes Father   . Hypertension Father   . Kidney cancer Mother   . Cancer Mother        renal  . Hyperlipidemia Mother   . Hypertension  Mother   . Cancer Sister        breast  . Hyperlipidemia Sister   . Hypertension Sister   . Breast cancer Sister   . Hyperlipidemia Brother   . Hypertension Brother   . Prostate cancer Brother   . Colon cancer Neg Hx   . Colon polyps Neg Hx   . Esophageal cancer Neg Hx   . Rectal cancer Neg Hx   . Stomach cancer Neg Hx    Social History:  reports that he has been smoking cigarettes. He has a 102.00 pack-year smoking history. He has never used smokeless tobacco. He reports previous alcohol use. He reports that he does not use drugs.  ROS: A complete review of systems was performed.  All systems are negative except for pertinent findings as noted. Review of Systems  Musculoskeletal: Positive for joint pain.     Physical Exam:  Vital signs in last 24 hours: Temp:  [98.5 F (36.9 C)] 98.5 F (36.9 C) (07/20 0844) Pulse Rate:  [68] 68 (07/20 0844) Resp:  [18] 18 (07/20 0844) BP: (129)/(63) 129/63 (07/20 0844) SpO2:  [94 %] 94 % (07/20 0844) Weight:  [69.8 kg] 69.8 kg (07/20 0844) General:  Alert and oriented, No acute distress HEENT: Normocephalic, atraumatic Cardiovascular: Regular rate and rhythm Lungs: Regular rate and effort Abdomen: Soft, nontender, nondistended, no abdominal masses Back: No CVA tenderness Extremities: No edema Neurologic: Grossly intact  Laboratory Data:  Results for orders placed or performed during the hospital encounter of 07/07/20 (from the past 24 hour(s))  I-STAT, chem 8     Status: Abnormal   Collection Time: 07/07/20  9:40 AM  Result Value Ref Range   Sodium 143 135 - 145 mmol/L   Potassium 3.4 (L) 3.5 - 5.1 mmol/L   Chloride 101 98 - 111 mmol/L   BUN 15  8 - 23 mg/dL   Creatinine, Ser 1.60 (H) 0.61 - 1.24 mg/dL   Glucose, Bld 76 70 - 99 mg/dL   Calcium, Ion 1.21 1.15 - 1.40 mmol/L   TCO2 28 22 - 32 mmol/L   Hemoglobin 13.9 13.0 - 17.0 g/dL   HCT 41.0 39 - 52 %   Recent Results (from the past 240 hour(s))  SARS CORONAVIRUS 2 (TAT  6-24 HRS) Nasopharyngeal Nasopharyngeal Swab     Status: None   Collection Time: 07/04/20 10:58 AM   Specimen: Nasopharyngeal Swab  Result Value Ref Range Status   SARS Coronavirus 2 NEGATIVE NEGATIVE Final    Comment: (NOTE) SARS-CoV-2 target nucleic acids are NOT DETECTED.  The SARS-CoV-2 RNA is generally detectable in upper and lower respiratory specimens during the acute phase of infection. Negative results do not preclude SARS-CoV-2 infection, do not rule out co-infections with other pathogens, and should not be used as the sole basis for treatment or other patient management decisions. Negative results must be combined with clinical observations, patient history, and epidemiological information. The expected result is Negative.  Fact Sheet for Patients: SugarRoll.be  Fact Sheet for Healthcare Providers: https://www.woods-mathews.com/  This test is not yet approved or cleared by the [place] FDA and  has been authorized for detection and/or diagnosis of SARS-CoV-2 by FDA under an Emergency Use Authorization (EUA). This EUA will remain  in effect (meaning this test can be used) for the duration of the COVID-19 declaration under Se ction 564(b)(1) of the Act, 21 U.S.C. section 360bbb-3(b)(1), unless the authorization is terminated or revoked sooner.  Performed at [facility], [place] [address], [place], [place] [address]    Creatinine: Recent Labs    07/07/20 0940  CREATININE 1.60*    Impression/Assessment/plan:  Right UPJ obstruction-I discussed with the patient the nature, potential benefits, risks and alternatives to cystoscopy with right retrograde pyelogram and right ureteral stent exchange, including side effects of the proposed treatment, the likelihood of the patient achieving the goals of the procedure, and any potential problems that might occur during the procedure or recuperation. All questions answered.  Patient elects to proceed.    Festus Aloe 07/07/2020, 11:50 AM

## 2020-07-07 NOTE — Discharge Instructions (Signed)
Ureteral Stent Implantation, Care After This sheet gives you information about how to care for yourself after your procedure. Your health care provider may also give you more specific instructions. If you have problems or questions, contact your health care provider. What can I expect after the procedure? After the procedure, it is common to have:  Nausea.  Mild pain when you urinate. You may feel this pain in your lower back or lower abdomen. The pain should stop within a few minutes after you urinate. This may last for up to 1 week.  A small amount of blood in your urine for several days. Follow these instructions at home: Medicines  Take over-the-counter and prescription medicines only as told by your health care provider.  If you were prescribed an antibiotic medicine, take it as told by your health care provider. Do not stop taking the antibiotic even if you start to feel better.  Do not drive for 24 hours if you were given a sedative during your procedure.  Ask your health care provider if the medicine prescribed to you requires you to avoid driving or using heavy machinery. Activity  Rest as told by your health care provider.  Avoid sitting for a long time without moving. Get up to take short walks every 1-2 hours. This is important to improve blood flow and breathing. Ask for help if you feel weak or unsteady.  Return to your normal activities as told by your health care provider. Ask your health care provider what activities are safe for you. General instructions   Watch for any blood in your urine. Call your health care provider if the amount of blood in your urine increases.  If you have a catheter: ? Follow instructions from your health care provider about taking care of your catheter and collection bag. ? Do not take baths, swim, or use a hot tub until your health care provider approves. Ask your health care provider if you may take showers. You may only be allowed to  take sponge baths.  Drink enough fluid to keep your urine pale yellow.  Do not use any products that contain nicotine or tobacco, such as cigarettes, e-cigarettes, and chewing tobacco. These can delay healing after surgery. If you need help quitting, ask your health care provider.  Keep all follow-up visits as told by your health care provider. This is important. Contact a health care provider if:  You have pain that gets worse or does not get better with medicine, especially pain when you urinate.  You have difficulty urinating.  You feel nauseous or you vomit repeatedly during a period of more than 2 days after the procedure. Get help right away if:  Your urine is dark red or has blood clots in it.  You are leaking urine (have incontinence).  The end of the stent comes out of your urethra.  You cannot urinate.  You have sudden, sharp, or severe pain in your abdomen or lower back.  You have a fever.  You have swelling or pain in your legs.  You have difficulty breathing. Summary  After the procedure, it is common to have mild pain when you urinate that goes away within a few minutes after you urinate. This may last for up to 1 week.  Watch for any blood in your urine. Call your health care provider if the amount of blood in your urine increases.  Take over-the-counter and prescription medicines only as told by your health care provider.  Drink   enough fluid to keep your urine pale yellow. °This information is not intended to replace advice given to you by your health care provider. Make sure you discuss any questions you have with your health care provider. °Document Revised: 09/11/2018 Document Reviewed: 09/12/2018 °Elsevier Patient Education © 2020 [employer] ° ° °Post Anesthesia Home Care Instructions ° °Activity: °Get plenty of rest for the remainder of the day. A responsible individual must stay with you for 24 hours following the procedure.  °For the next 24 hours, DO  NOT: °-Drive a car °-Operate machinery °-Drink alcoholic beverages °-Take any medication unless instructed by your physician °-Make any legal decisions or sign important papers. ° °Meals: °Start with liquid foods such as gelatin or soup. Progress to regular foods as tolerated. Avoid greasy, spicy, heavy foods. If nausea and/or vomiting occur, drink only clear liquids until the nausea and/or vomiting subsides. Call your physician if vomiting continues. ° °Special Instructions/Symptoms: °Your throat may feel dry or sore from the anesthesia or the breathing tube placed in your throat during surgery. If this causes discomfort, gargle with warm salt water. The discomfort should disappear within 24 hours. ° ° °

## 2020-07-07 NOTE — Anesthesia Procedure Notes (Signed)
Procedure Name: LMA Insertion Date/Time: 07/07/2020 1:19 PM Performed by: Lieutenant Diego, CRNA Pre-anesthesia Checklist: Patient identified, Emergency Drugs available, Suction available and Patient being monitored Patient Re-evaluated:Patient Re-evaluated prior to induction Oxygen Delivery Method: Circle system utilized Preoxygenation: Pre-oxygenation with 100% oxygen Induction Type: IV induction Ventilation: Mask ventilation without difficulty LMA: LMA inserted LMA Size: 4.0 Number of attempts: 1 Placement Confirmation: positive ETCO2 and breath sounds checked- equal and bilateral Tube secured with: Tape Dental Injury: Teeth and Oropharynx as per pre-operative assessment

## 2020-07-07 NOTE — Anesthesia Postprocedure Evaluation (Signed)
Anesthesia Post Note  Patient: Dean [name].  Procedure(s) Performed: CYSTOSCOPY WITH STENT CHANGE (Right Ureter)     Patient location during evaluation: PACU Anesthesia Type: General Level of consciousness: awake and alert Pain management: pain level controlled Vital Signs Assessment: post-procedure vital signs reviewed and stable Respiratory status: spontaneous breathing, nonlabored ventilation, respiratory function stable and patient connected to nasal cannula oxygen Cardiovascular status: blood pressure returned to baseline and stable Postop Assessment: no apparent nausea or vomiting Anesthetic complications: no   No complications documented.  Last Vitals:  Vitals:   07/07/20 1415 07/07/20 1500  BP: (!) 110/58 113/78  Pulse: (!) 57 68  Resp: (!) 21 18  Temp:  36.4 C  SpO2: 91% 93%    Last Pain:  Vitals:   07/07/20 1500  TempSrc:   PainSc: 0-No pain                 Barnet Glasgow

## 2020-07-07 NOTE — Transfer of Care (Signed)
Immediate Anesthesia Transfer of Care Note  Patient: Dean [name].  Procedure(s) Performed: CYSTOSCOPY WITH STENT CHANGE (Right Ureter)  Patient Location: PACU  Anesthesia Type:General  Level of Consciousness: drowsy  Airway & Oxygen Therapy: Patient Spontanous Breathing and Patient connected to face mask oxygen  Post-op Assessment: Report given to RN and Post -op Vital signs reviewed and stable  Post vital signs: Reviewed and stable  Last Vitals:  Vitals Value Taken Time  BP 107/54 07/07/20 1345  Temp    Pulse 53 07/07/20 1346  Resp 13 07/07/20 1346  SpO2 100 % 07/07/20 1346  Vitals shown include unvalidated device data.  Last Pain:  Vitals:   07/07/20 0844  TempSrc: Oral  PainSc: 6       Patients Stated Pain Goal: 7 (98/26/41 5830)  Complications: No complications documented.

## 2020-07-08 ENCOUNTER — Encounter (HOSPITAL_BASED_OUTPATIENT_CLINIC_OR_DEPARTMENT_OTHER): Payer: Self-pay | Admitting: Urology

## 2020-07-08 DIAGNOSIS — N1831 Chronic kidney disease, stage 3a: Secondary | ICD-10-CM | POA: Diagnosis not present

## 2020-07-08 DIAGNOSIS — I739 Peripheral vascular disease, unspecified: Secondary | ICD-10-CM | POA: Diagnosis not present

## 2020-07-08 DIAGNOSIS — E1159 Type 2 diabetes mellitus with other circulatory complications: Secondary | ICD-10-CM | POA: Diagnosis not present

## 2020-07-08 DIAGNOSIS — I2510 Atherosclerotic heart disease of native coronary artery without angina pectoris: Secondary | ICD-10-CM | POA: Diagnosis not present

## 2020-07-08 DIAGNOSIS — E785 Hyperlipidemia, unspecified: Secondary | ICD-10-CM | POA: Diagnosis not present

## 2020-07-08 DIAGNOSIS — J449 Chronic obstructive pulmonary disease, unspecified: Secondary | ICD-10-CM | POA: Diagnosis not present

## 2020-07-08 DIAGNOSIS — M25551 Pain in right hip: Secondary | ICD-10-CM | POA: Diagnosis not present

## 2020-07-08 DIAGNOSIS — F17200 Nicotine dependence, unspecified, uncomplicated: Secondary | ICD-10-CM | POA: Diagnosis not present

## 2020-07-08 DIAGNOSIS — I129 Hypertensive chronic kidney disease with stage 1 through stage 4 chronic kidney disease, or unspecified chronic kidney disease: Secondary | ICD-10-CM | POA: Diagnosis not present

## 2020-07-08 NOTE — H&P (Signed)
TOTAL HIP ADMISSION H&P  Patient is admitted for right total hip arthroplasty.  Subjective:  Chief Complaint: Right hip pain  HPI: Dean [name]., 74 y.o. male, has a history of pain and functional disability in the right hip due to arthritis and patient has failed non-surgical conservative treatments for greater than 12 weeks to include corticosteriod injections and activity modification. Onset of symptoms was gradual, starting several years ago with gradually worsening course since that time. The patient noted no past surgery on the right hip. Patient currently rates pain in the right hip at 7 out of 10 with activity. Patient has worsening of pain with activity and weight bearing, pain that interfers with activities of daily living and crepitus. 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 This condition presents safety issues increasing the risk of falls. There is no current active infection.  Patient Active Problem List   Diagnosis Date Noted   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   Atherosclerosis of native coronary artery of native heart without angina pectoris 01/30/2018   PAOD (peripheral arterial occlusive disease) ([place]) 01/30/2018   COPD with chronic bronchitis ([place]) 01/30/2018   Parotid mass 03/26/2017   Status post bypass graft of extremity 02/06/2017   Back pain of thoracolumbar region 03/31/2015   SOB (shortness of breath) 12/25/2014   PVC's (premature ventricular contractions) 12/25/2014   Abnormal EKG 12/25/2014   PAD (peripheral artery disease) ([place]) 12/25/2014   Dyslipidemia 12/25/2014   Current smoker 12/25/2014   Aftercare following surgery of the circulatory system, NEC 12/23/2013   Screening for depression 11/06/2013   Cough 06/24/2013   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[place]) 12/25/2012    Blood glucose elevated 11/02/2012   Candidal dermatitis 11/02/2011   Adiposity 07/11/2011   Accumulated fluid in larynx 09/24/2010   Atypical chest pain 09/23/2010   Lumbar canal stenosis 08/05/2009   COPD mixed type ([place]) 04/15/2009   TOBACCO USER 03/10/2009   Allergic rhinitis 03/10/2009   GERD 05/20/2008   ABDOMINAL PAIN-EPIGASTRIC 05/20/2008   HYPERLIPIDEMIA 04/11/2008   Essential hypertension 04/11/2008   Coronary atherosclerosis 04/11/2008   PROCTITIS 04/11/2008   Obstructive sleep apnea 03/01/2008   Lung nodule 02/29/2008   RENAL CYST, LEFT 06/15/2007   COLONIC POLYPS, ADENOMATOUS 08/11/2005   DIVERTICULOSIS, COLON 08/11/2005   INTERNAL HEMORRHOIDS 06/04/2002    Past Medical History:  Diagnosis Date   AAA (abdominal aortic aneurysm) ([place])    infrarenal aaa 3.1 x 2.8 per abd/pelvis ct 02-26-20 epic   Allergy    pollen   Arthritis    Asthma    Atrial fibrillation ([place])    no documentation in epic/ pt states has never been told has A Fib   CAD (coronary artery disease)    s/p angioplasty and stenting x3 1994, pt states has this done   Cataract    small, mild   Chronic kidney disease    kidney cyst   Complication of anesthesia    woke up during gall bladder sx yrs ago and with colonscopy   Complication of anesthesia    pt thinks stopped breathing after gallbladder sx years ago, no record found   COPD (chronic obstructive pulmonary disease) ([facility])    GERD (gastroesophageal reflux disease)    hx of   History of kidney stones    History of prediabetes    none in last  8 months since 40 lb weight loss   Hyperlipidemia    Hypertension    Lower extremity edema since 02-14-2020   swelling goes down when sleeps resolved 06-29-20   Neuromuscular disorder ([place])    nerve issues with back    Peripheral vascular disease ([place])    PVC's (premature ventricular contractions)    Sleep apnea    wears cpap auto set 5 to 20    Past  Surgical History:  Procedure Laterality Date   APPENDECTOMY     CHOLECYSTECTOMY  1990s   Gall Bladder   COLONOSCOPY     CORONARY ANGIOPLASTY WITH STENT PLACEMENT  1994   CYSTOSCOPY WITH INSERTION OF UROLIFT N/A 04/14/2020   Procedure: CYSTOSCOPY WITH INSERTION OF UROLIFT;  Surgeon: Festus Aloe, MD;  Location: [facility];  Service: Urology;  Laterality: N/A;   CYSTOSCOPY WITH STENT PLACEMENT Right 07/07/2020   Procedure: CYSTOSCOPY WITH STENT CHANGE;  Surgeon: Festus Aloe, MD;  Location: [facility];  Service: Urology;  Laterality: Right;   CYSTOSCOPY WITH URETEROSCOPY AND STENT PLACEMENT Bilateral 01/03/2020   Procedure: CYSTOSCOPY WITH RETROGRADE/ URETEROSCOPY AND RIGHT STENT PLACEMENT BLADDER BIOPSY;  Surgeon: Festus Aloe, MD;  Location[facility];  Service: Urology;  Laterality: Bilateral;  ONLY NEEDS 30 MIN   CYSTOSCOPY WITH URETEROSCOPY AND STENT PLACEMENT Right 04/14/2020   Procedure: CYSTOSCOPY WITH URETEROSCOPY, RETROGRADE  AND STENT REPLACEMENT;  Surgeon: Festus Aloe, MD;  Location: [facility];  Service: Urology;  Laterality: Right;   EYE SURGERY Bilateral 2019   cataracts   lumbar disectomy  2005   L3 - 4 - 5   POLYPECTOMY     PR VEIN BYPASS GRAFT,AORTO-FEM-POP  08/31/11   Right to Left Fem-Fem   RENAL ANGIOGRAPHY N/A 02/14/2020   Procedure: RENAL ANGIOGRAPHY;  Surgeon: Elam Dutch, MD;  Location: [facility];  Service: Cardiovascular;  Laterality: N/A;  Bilateral   TONSILLECTOMY  as child   TONSILLECTOMY     UMBILICAL HERNIA REPAIR     x 2    Prior to Admission medications   Medication Sig Start Date End Date Taking? Authorizing Provider  albuterol (PROVENTIL HFA) 108 (90 Base) MCG/ACT inhaler Inhale 1 puff into the lungs 2 (two) times daily. 07/03/19  Yes Young, Tarri Fuller D, MD  albuterol (PROVENTIL) (2.5 MG/3ML) 0.083% nebulizer solution USE 1 VIAL VIA NEBULIZER  EVERY 6 HOURS AS NEEDED FOR WHEEZING OR SHORTNESS OF BREATH Patient taking differently: Take 2.5 mg by nebulization every 6 (six) hours as needed for wheezing.  07/03/19  Yes Young, Tarri Fuller D, MD  antiseptic oral rinse (BIOTENE) LIQD 15 mLs by Mouth Rinse route as needed for dry mouth.   Yes [provider]  aspirin 325 MG tablet Take 325 mg by mouth daily with supper.    Yes [provider]  atorvastatin (LIPITOR) 10 MG tablet Take 10 mg by mouth every evening.    Yes [provider]  Azelastine-Fluticasone 137-50 MCG/ACT SUSP PLACE 1-2 SPRAYS INTO EACH NOSTRIL AT BEDTIME Patient taking differently: Place 1-2 sprays into both nostrils 2 (two) times daily as needed (congestion.).  01/07/20  Yes Young, Tarri Fuller D, MD  diltiazem (TIAZAC) 240 MG 24 hr capsule Take 240 mg by mouth daily.  07/17/19  Yes [provider]  docusate sodium (COLACE) 100 MG capsule Take 100 mg by mouth daily as needed for mild constipation.   Yes [provider]  esomeprazole (NEXIUM) 40 MG capsule Take 40  mg by mouth 2 (two) times daily as needed (acid reflux/indigestion.).  02/07/19  Yes [provider]  finasteride (PROSCAR) 5 MG tablet Take 5 mg by mouth at bedtime.  01/20/20  Yes [provider]  hydroxypropyl methylcellulose / hypromellose (ISOPTO TEARS / GONIOVISC) 2.5 % ophthalmic solution Place 1 drop into both eyes 3 (three) times daily as needed for dry eyes.   Yes [provider]  irbesartan (AVAPRO) 150 MG tablet Take 150 mg by mouth every evening.  11/11/19  Yes [provider]  isosorbide mononitrate (IMDUR) 60 MG 24 hr tablet Take 60 mg by mouth every evening.    Yes [provider]  loperamide (IMODIUM A-D) 2 MG tablet Take 2 mg by mouth 4 (four) times daily as needed for diarrhea or loose stools.   Yes [provider]  Magnesium 400 MG TABS Take 400 mg by mouth every other day.    Yes [provider]  meloxicam  (MOBIC) 15 MG tablet Take 15 mg by mouth daily as needed for pain.   Yes [provider]  Menthol, Topical Analgesic, (BLUE-EMU MAXIMUM STRENGTH EX) Apply 1 application topically 3 (three) times daily as needed (pain.).   Yes [provider]  simethicone (MYLICON) 664 MG chewable tablet Chew 125 mg by mouth every 6 (six) hours as needed for flatulence. Takes gas x   Yes [provider]  tamsulosin (FLOMAX) 0.4 MG CAPS capsule Take 0.4 mg by mouth at bedtime.  01/20/20  Yes [provider]  traMADol (ULTRAM) 50 MG tablet Take 50 mg by mouth every 6 (six) hours as needed for pain. 06/15/20  Yes [provider]  Vitamin D, Ergocalciferol, (DRISDOL) 50000 UNITS CAPS Take 50,000 Units by mouth every Friday at 6 PM.  07/03/12  Yes [provider]  Budeson-Glycopyrrol-Formoterol (BREZTRI AEROSPHERE) 160-9-4.8 MCG/ACT AERO Inhale 2 puffs into the lungs in the morning and at bedtime. Patient not taking: Reported on 06/24/2020 05/13/20   Deneise Lever, MD  nitrofurantoin (MACRODANTIN) 50 MG capsule Take 1 capsule (50 mg total) by mouth at bedtime. 07/07/20   Festus Aloe, MD  pseudoephedrine-acetaminophen (TYLENOL SINUS) 30-500 MG TABS tablet Take 1 tablet by mouth every 4 (four) hours as needed (sinus congestion). Patient not taking: Reported on 06/24/2020    [provider]    Allergies  Allergen Reactions   Penicillins     Childhood reaction  REACTION: rapid pulse Did it involve swelling of the face/tongue/throat, SOB, or low BP? Yes Did it involve sudden or severe rash/hives, skin peeling, or any reaction on the inside of your mouth or nose? Unknown Did you need to seek medical attention at a hospital or doctor's office? yes When did it last happen?childhood  If all above answers are "NO", may proceed with cephalosporin use.    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  Tobacco Use   Smoking status: Current Every Day Smoker    Packs/day: 2.00    Years: 51.00    Pack years: 102.00    Types: Cigarettes   Smokeless tobacco:  Never Used   Tobacco comment: smoking a little more than a pack a day d/t stress and pain  Vaping Use   Vaping Use: Former  Substance and Sexual Activity   Alcohol use: Not Currently    Alcohol/week: 0.0 standard drinks   Drug use: No   Sexual activity: Not on file  Other Topics Concern   Not on file  Social History Narrative   Travelled to [place] and [place] 2008, [place] 2009   Social Determinants of Health   Financial Resource Strain:    Difficulty of Paying Living Expenses:   Food Insecurity:    Worried About Charity fundraiser in the Last Year:    Arboriculturist in the Last Year:   Transportation Needs:    Film/video editor (Medical):    Lack of Transportation (Non-Medical):   Physical Activity:    Days of Exercise per Week:    Minutes of Exercise per Session:   Stress:    Feeling of Stress :   Social Connections:    Frequency of Communication with Friends and Family:    Frequency of Social Gatherings with Friends and Family:    Attends Religious Services:    Active Member of Clubs or Organizations:    Attends Archivist Meetings:    Marital Status:   Intimate Partner Violence:    Fear of Current or Ex-Partner:    Emotionally Abused:    Physically Abused:    Sexually Abused:       Tobacco Use: High Risk   Smoking Tobacco Use: Current Every Day Smoker   Smokeless Tobacco Use: Never Used   Social History   Substance and Sexual Activity  Alcohol Use Not Currently   Alcohol/week: 0.0  standard drinks    Family History  Problem Relation Age of Onset   Other Father        ETOH   Diabetes Father    Hypertension Father    Kidney cancer Mother    Cancer Mother        renal   Hyperlipidemia Mother    Hypertension Mother    Cancer Sister        breast   Hyperlipidemia Sister    Hypertension Sister    Breast cancer Sister    Hyperlipidemia Brother    Hypertension Brother    Prostate cancer Brother    Colon cancer Neg Hx    Colon polyps Neg Hx    Esophageal cancer Neg Hx    Rectal cancer Neg Hx    Stomach cancer Neg Hx     Review of Systems  Constitutional: Negative for chills and fever.  HENT: Negative for congestion, sore throat and tinnitus.   Eyes: Negative for double vision, photophobia and pain.  Respiratory: Negative for cough, shortness of breath and wheezing.   Cardiovascular: Negative for chest pain, palpitations and orthopnea.  Gastrointestinal: Negative for heartburn, nausea and vomiting.  Genitourinary: Negative for dysuria, frequency and urgency.  Musculoskeletal: Positive for joint pain.  Neurological: Negative for dizziness, weakness and headaches.     Objective:  Physical Exam: Well nourished and well developed.  General: Alert and oriented x3, cooperative and pleasant, no acute distress.  Head: normocephalic, atraumatic, neck supple.  Eyes: EOMI.  Respiratory: breath sounds clear in all fields, no wheezing, rales, or rhonchi. Cardiovascular: Regular rate and rhythm, no murmurs, gallops or rubs.  Abdomen: non-tender to palpation and soft, normoactive bowel sounds. Musculoskeletal:  Right Hip Exam:  The  range of motion: Flexion to 100 degrees, Internal Rotation to 0 degrees, External Rotation to 10 to 20 degrees, and abduction to 20 degrees.  There is no tenderness over the greater trochanteric bursa.   Calves soft and nontender. Motor function intact in LE. Strength 5/5 LE bilaterally. Neuro: Distal pulses 2+.  Sensation to light touch intact in LE.  Vital signs in last 24 hours: Temp:  [97.6 F (36.4 C)-97.7 F (36.5 C)] 97.6 F (36.4 C) (07/20 1500) Pulse Rate:  [49-68] 68 (07/20 1500) Resp:  [13-21] 18 (07/20 1500) BP: (107-113)/(54-78) 113/78 (07/20 1500) SpO2:  [91 %-100 %] 93 % (07/20 1500)  Imaging Review Plain radiographs demonstrate severe degenerative joint disease of the right hip. The bone quality appears to be adequate for age and reported activity level.  Assessment/Plan:  End stage arthritis, right hip  The patient history, physical examination, clinical judgement of the provider and imaging studies are consistent with end stage degenerative joint disease of the right hip and total hip arthroplasty is deemed medically necessary. The treatment options including medical management, injection therapy, arthroscopy and arthroplasty were discussed at length. The risks and benefits of total hip arthroplasty were presented and reviewed. The risks due to aseptic loosening, infection, stiffness, dislocation/subluxation, thromboembolic complications and other imponderables were discussed. The patient acknowledged the explanation, agreed to proceed with the plan and consent was signed. Patient is being admitted for inpatient treatment for surgery, pain control, PT, OT, prophylactic antibiotics, VTE prophylaxis, progressive ambulation and ADLs and discharge planning.The patient is planning to be discharged home.   Patient's anticipated LOS is less than 2 midnights, meeting these requirements: - Lives within 1 hour of care - Has a competent adult at home to recover with post-op recover - NO history of  - Chronic pain requiring opiods  - Diabetes  - Heart failure  - Heart attack  - Stroke  - DVT/VTE  - Cardiac arrhythmia  - Renal failure  - Anemia  - Advanced Liver disease  Therapy Plans: HEP Disposition: Home with hired help Planned DVT Prophylaxis: Xarelto 10 mg QD DME Needed:  None PCP: [name], MD Cardiologist: Lyman Bishop, MD (clearance received) TXA: IV Allergies: PCN (childhood rxn) Anesthesia Concerns: Awakening during surgery BMI: 26.2 Last HgbA1c: Not diabetic  Other: - Hx CAD, COPD  - Patient was instructed on what medications to stop prior to surgery. - Follow-up visit in 2 weeks with Dr. Wynelle Link - Begin physical therapy following surgery - Pre-operative lab work as pre-surgical testing - Prescriptions will be provided in hospital at time of discharge  Theresa Duty, PA-C Orthopedic Surgery [facility] [place]

## 2020-07-10 NOTE — Patient Instructions (Signed)
DUE TO COVID-19 ONLY ONE VISITOR IS ALLOWED TO COME WITH YOU AND STAY IN THE WAITING ROOM ONLY DURING PRE OP AND PROCEDURE DAY OF SURGERY. THE 2 VISITORS  MAY VISIT WITH YOU AFTER SURGERY IN YOUR PRIVATE ROOM DURING VISITING HOURS ONLY!   ONCE YOUR COVID TEST IS COMPLETED, PLEASE BEGIN THE QUARANTINE INSTRUCTIONS AS OUTLINED IN YOUR HANDOUT.                [name].    Your procedure is scheduled on: 07/15/20   Report to [facility] Main  Entrance   Report to Short Stay at 5:30 AM     Call this number if you have problems the morning of surgery [phone]   . BRUSH YOUR TEETH MORNING OF SURGERY AND RINSE YOUR MOUTH OUT, NO CHEWING GUM CANDY OR MINTS.  Do not eat food After Midnight  . YOU MAY HAVE CLEAR LIQUIDS FROM MIDNIGHT UNTIL 4:30 AM.   At 4:30 AM Please finish the prescribed Pre-Surgery  drink.   Nothing by mouth after you finish the  drink !    Take these medicines the morning of surgery with A SIP OF WATER:  Diltiazem, Nexium, use your eye drops.   Use your inhalers and bring them with you to the hospital.    Bring your mask and tubing to the hospital                                 You may not have any metal on your body including              piercings  Do not wear jewelry, lotions, powders or deodorant              Men may shave face and neck.   Do not bring valuables to the hospital. [place].  Contacts, dentures or bridgework may not be worn into surgery.       Name and phone number of your driver:  Special Instructions: N/A              Please read over the following fact sheets you were given: _____________________________________________________________________             [facility] - Preparing for Surgery Before surgery, you can play an important role.   Because skin is not sterile, your skin needs to be as free of germs as possible .  You can reduce the number of germs  on your skin by washing with CHG (chlorahexidine gluconate) soap before surgery .  CHG is an antiseptic cleaner which kills germs and bonds with the skin to continue killing germs even after washing. Please DO NOT use if you have an allergy to CHG or antibacterial soaps.   If your skin becomes reddened/irritated stop using the CHG and inform your nurse when you arrive at Short Stay. .  You may shave your face/neck.  Please follow these instructions carefully:  1.  Shower with CHG Soap the night before surgery and the  morning of Surgery.  2.  If you choose to wash your hair, wash your hair first as usual with your  normal  shampoo.  3.  After you shampoo, rinse your hair and body thoroughly to remove the  shampoo.  4.  Use CHG as you would any other liquid soap.  You can apply chg directly  to the skin and wash                       Gently with a scrungie or clean washcloth.  5.  Apply the CHG Soap to your body ONLY FROM THE NECK DOWN.   Do not use on face/ open                           Wound or open sores. Avoid contact with eyes, ears mouth and genitals (private parts).                       Wash face,  Genitals (private parts) with your normal soap.             6.  Wash thoroughly, paying special attention to the area where your surgery  will be performed.  7.  Thoroughly rinse your body with warm water from the neck down.  8.  DO NOT shower/wash with your normal soap after using and rinsing off  the CHG Soap.             9.  Pat yourself dry with a clean towel.            10.  Wear clean pajamas.            11.  Place clean sheets on your bed the night of your first shower and do not  sleep with pets. Day of Surgery : Do not apply any lotions/deodorants the morning of surgery.  Please wear clean clothes to the hospital/surgery center.  FAILURE TO FOLLOW THESE INSTRUCTIONS MAY RESULT IN THE CANCELLATION OF YOUR SURGERY PATIENT  SIGNATURE_________________________________  NURSE SIGNATURE__________________________________  ________________________________________________________________________   Dean [name]  An incentive spirometer is a tool that can help keep your lungs clear and active. This tool measures how well you are filling your lungs with each breath. Taking long deep breaths may help reverse or decrease the chance of developing breathing (pulmonary) problems (especially infection) following:  A long period of time when you are unable to move or be active. BEFORE THE PROCEDURE   If the spirometer includes an indicator to show your best effort, your nurse or respiratory therapist will set it to a desired goal.  If possible, sit up straight or lean slightly forward. Try not to slouch.  Hold the incentive spirometer in an upright position. INSTRUCTIONS FOR USE  1. Sit on the edge of your bed if possible, or sit up as far as you can in bed or on a chair. 2. Hold the incentive spirometer in an upright position. 3. Breathe out normally. 4. Place the mouthpiece in your mouth and seal your lips tightly around it. 5. Breathe in slowly and as deeply as possible, raising the piston or the ball toward the top of the column. 6. Hold your breath for 3-5 seconds or for as long as possible. Allow the piston or ball to fall to the bottom of the column. 7. Remove the mouthpiece from your mouth and breathe out normally. 8. Rest for a few seconds and repeat Steps 1 through 7 at least 10 times every 1-2 hours when you are awake. Take your time and take a few normal breaths between deep breaths. 9. The spirometer may include an indicator to show your best effort.  Use the indicator as a goal to work toward during each repetition. 10. After each set of 10 deep breaths, practice coughing to be sure your lungs are clear. If you have an incision (the cut made at the time of surgery), support your incision when coughing  by placing a pillow or rolled up towels firmly against it. Once you are able to get out of bed, walk around indoors and cough well. You may stop using the incentive spirometer when instructed by your caregiver.  RISKS AND COMPLICATIONS  Take your time so you do not get dizzy or light-headed.  If you are in pain, you may need to take or ask for pain medication before doing incentive spirometry. It is harder to take a deep breath if you are having pain. AFTER USE  Rest and breathe slowly and easily.  It can be helpful to keep track of a log of your progress. Your caregiver can provide you with a simple table to help with this. If you are using the spirometer at home, follow these instructions: [place] IF:   You are having difficultly using the spirometer.  You have trouble using the spirometer as often as instructed.  Your pain medication is not giving enough relief while using the spirometer.  You develop fever of 100.5 F (38.1 C) or higher. SEEK IMMEDIATE MEDICAL CARE IF:   You cough up bloody sputum that had not been present before.  You develop fever of 102 F (38.9 C) or greater.  You develop worsening pain at or near the incision site. MAKE SURE YOU:   Understand these instructions.  Will watch your condition.  Will get help right away if you are not doing well or get worse. Document Released: 04/17/2007 Document Revised: 02/27/2012 Document Reviewed: 06/18/2007 [facility] Patient Information 2014 [place], [place].   ________________________________________________________________________

## 2020-07-11 ENCOUNTER — Other Ambulatory Visit (HOSPITAL_COMMUNITY)
Admission: RE | Admit: 2020-07-11 | Discharge: 2020-07-11 | Disposition: A | Discharge status: Home / Self Care | Payer: Medicare PPO | Source: Ambulatory Visit | Attending: Orthopedic Surgery | Admitting: Orthopedic Surgery

## 2020-07-11 DIAGNOSIS — Z01812 Encounter for preprocedural laboratory examination: Secondary | ICD-10-CM | POA: Diagnosis not present

## 2020-07-11 DIAGNOSIS — Z20822 Contact with and (suspected) exposure to covid-19: Secondary | ICD-10-CM | POA: Insufficient documentation

## 2020-07-11 LAB — SARS CORONAVIRUS 2 (TAT 6-24 HRS): SARS Coronavirus 2: NEGATIVE

## 2020-07-13 ENCOUNTER — Encounter (HOSPITAL_COMMUNITY)
Admission: RE | Admit: 2020-07-13 | Discharge: 2020-07-13 | Disposition: A | Discharge status: Home / Self Care | Payer: Medicare PPO | Source: Ambulatory Visit | Attending: Orthopedic Surgery | Admitting: Orthopedic Surgery

## 2020-07-13 ENCOUNTER — Encounter (HOSPITAL_COMMUNITY): Payer: Self-pay

## 2020-07-13 ENCOUNTER — Other Ambulatory Visit: Payer: Self-pay

## 2020-07-13 DIAGNOSIS — Z01812 Encounter for preprocedural laboratory examination: Secondary | ICD-10-CM | POA: Insufficient documentation

## 2020-07-13 HISTORY — DX: Personal history of other diseases of the digestive system: Z87.19

## 2020-07-13 HISTORY — DX: Cardiac arrhythmia, unspecified: I49.9

## 2020-07-13 LAB — CBC
HCT: 43.7 % (ref 39.0–52.0)
Hemoglobin: 14.3 g/dL (ref 13.0–17.0)
MCH: 31.6 pg (ref 26.0–34.0)
MCHC: 32.7 g/dL (ref 30.0–36.0)
MCV: 96.5 fL (ref 80.0–100.0)
Platelets: 226 10*3/uL (ref 150–400)
RBC: 4.53 MIL/uL (ref 4.22–5.81)
RDW: 14.4 % (ref 11.5–15.5)
WBC: 10.3 10*3/uL (ref 4.0–10.5)
nRBC: 0 % (ref 0.0–0.2)

## 2020-07-13 LAB — COMPREHENSIVE METABOLIC PANEL
ALT: 12 U/L (ref 0–44)
AST: 15 U/L (ref 15–41)
Albumin: 3.8 g/dL (ref 3.5–5.0)
Alkaline Phosphatase: 63 U/L (ref 38–126)
Anion gap: 8 (ref 5–15)
BUN: 17 mg/dL (ref 8–23)
CO2: 32 mmol/L (ref 22–32)
Calcium: 9.8 mg/dL (ref 8.9–10.3)
Chloride: 101 mmol/L (ref 98–111)
Creatinine, Ser: 1.32 mg/dL — ABNORMAL HIGH (ref 0.61–1.24)
GFR calc Af Amer: >60 mL/min (ref >60)
GFR calc non Af Amer: 53 mL/min — ABNORMAL LOW (ref >60)
Glucose, Bld: 92 mg/dL (ref 70–99)
Potassium: 4 mmol/L (ref 3.5–5.1)
Sodium: 141 mmol/L (ref 135–145)
Total Bilirubin: 0.6 mg/dL (ref 0.3–1.2)
Total Protein: 7.2 g/dL (ref 6.5–8.1)

## 2020-07-13 LAB — SURGICAL PCR SCREEN
MRSA, PCR: NEGATIVE
Staphylococcus aureus: NEGATIVE

## 2020-07-13 LAB — APTT: aPTT: 27 seconds (ref 24–36)

## 2020-07-13 LAB — PROTIME-INR
INR: 1.1 (ref 0.8–1.2)
Prothrombin Time: 13.4 seconds (ref 11.4–15.2)

## 2020-07-13 NOTE — Progress Notes (Signed)
COVID Vaccine Completed:Yes Date COVID Vaccine completed:02/24/20 COVID vaccine manufacturer: Pfizer     PCP - Dr. Deirdre Pippins Cardiologist - Dr. Alma Friendly  Chest x-ray - 05/13/20 EKG - 04/16/20 Stress Test - no ECHO - 08/23/19 Cardiac Cath - with stent 1994  Sleep Study - Yes CPAP - yes  Fasting Blood Sugar - NA Checks Blood Sugar _____ times a day  Blood Thinner Instructions: ASA 325 mg/ Dr. Reynaldo Minium Aspirin Instructions:Stop 7 days prior to DOS/ Dr. Wynelle Link Last Dose:07/08/20  Anesthesia review:   Patient denies shortness of breath, fever, cough and chest pain at PAT appointment Yes   Patient verbalized understanding of instructions that were given to them at the PAT appointment. Patient was also instructed that they will need to review over the PAT instructions again at home before surgery. Yes  Pt denies SOB with ADLs. He has a chair lift at his house and has been sedentary since Covid started.

## 2020-07-14 ENCOUNTER — Encounter: Payer: Self-pay | Admitting: Podiatry

## 2020-07-14 ENCOUNTER — Ambulatory Visit (INDEPENDENT_AMBULATORY_CARE_PROVIDER_SITE_OTHER): Payer: Medicare PPO | Admitting: Podiatry

## 2020-07-14 DIAGNOSIS — M79676 Pain in unspecified toe(s): Secondary | ICD-10-CM | POA: Diagnosis not present

## 2020-07-14 DIAGNOSIS — B351 Tinea unguium: Secondary | ICD-10-CM | POA: Diagnosis not present

## 2020-07-14 NOTE — Anesthesia Preprocedure Evaluation (Addendum)
Anesthesia Evaluation  Patient identified by MRN, date of birth, ID band Patient awake    Reviewed: Allergy & Precautions, NPO status , Patient's Chart, lab work & pertinent test results  History of Anesthesia Complications (+) AWARENESS UNDER ANESTHESIA and history of anesthetic complications  Airway Mallampati: II  TM Distance: >3 FB Neck ROM: Full    Dental no notable dental hx. (+) Dental Advisory Given   Pulmonary asthma , sleep apnea , COPD,  COPD inhaler, Current Smoker,    Pulmonary exam normal        Cardiovascular hypertension, Pt. on medications + CAD, + Cardiac Stents and + Peripheral Vascular Disease  Normal cardiovascular exam+ dysrhythmias Atrial Fibrillation   Echo 08/23/19 Left Ventricle: The left ventricle has hyperdynamic systolic function,  with an ejection fraction of >65%. The cavity size was normal. There is  mild concentric left ventricular hypertrophy. Left ventricular diastolic  Doppler parameters are consistent  with impaired relaxation. Normal left ventricular filling pressures    Neuro/Psych    GI/Hepatic GERD  ,  Endo/Other    Renal/GU Renal InsufficiencyRenal disease     Musculoskeletal  (+) Arthritis ,   Abdominal   Peds  Hematology   Anesthesia Other Findings   Reproductive/Obstetrics                            Anesthesia Physical  Anesthesia Plan  ASA: III  Anesthesia Plan: Spinal   Post-op Pain Management:    Induction: Intravenous  PONV Risk Score and Plan: Treatment may vary due to age or medical condition, Ondansetron and Propofol infusion  Airway Management Planned: Natural Airway  Additional Equipment: None  Intra-op Plan:   Post-operative Plan:   Informed Consent: I have reviewed the patients History and Physical, chart, labs and discussed the procedure including the risks, benefits and alternatives for the proposed anesthesia with  the patient or authorized representative who has indicated his/her understanding and acceptance.     Dental advisory given  Plan Discussed with: Anesthesiologist and CRNA  Anesthesia Plan Comments:        Anesthesia Quick Evaluation

## 2020-07-15 ENCOUNTER — Other Ambulatory Visit: Payer: Self-pay

## 2020-07-15 ENCOUNTER — Ambulatory Visit (HOSPITAL_COMMUNITY): Payer: Medicare PPO

## 2020-07-15 ENCOUNTER — Ambulatory Visit (HOSPITAL_COMMUNITY): Payer: Medicare PPO | Admitting: Emergency Medicine

## 2020-07-15 ENCOUNTER — Encounter (HOSPITAL_COMMUNITY)
Admission: RE | Disposition: A | Discharge status: Home / Self Care | Payer: Self-pay | Source: Home / Self Care | Attending: Orthopedic Surgery

## 2020-07-15 ENCOUNTER — Observation Stay (HOSPITAL_COMMUNITY)
Admission: RE | Admit: 2020-07-15 | Discharge: 2020-07-17 | Disposition: A | Discharge status: Home / Self Care | Payer: Medicare PPO | Attending: Orthopedic Surgery | Admitting: Orthopedic Surgery

## 2020-07-15 ENCOUNTER — Observation Stay (HOSPITAL_COMMUNITY): Payer: Medicare PPO

## 2020-07-15 ENCOUNTER — Encounter (HOSPITAL_COMMUNITY): Payer: Self-pay | Admitting: Orthopedic Surgery

## 2020-07-15 DIAGNOSIS — I10 Essential (primary) hypertension: Secondary | ICD-10-CM | POA: Diagnosis not present

## 2020-07-15 DIAGNOSIS — N189 Chronic kidney disease, unspecified: Secondary | ICD-10-CM | POA: Diagnosis not present

## 2020-07-15 DIAGNOSIS — Z86010 Personal history of colonic polyps: Secondary | ICD-10-CM | POA: Insufficient documentation

## 2020-07-15 DIAGNOSIS — Z7982 Long term (current) use of aspirin: Secondary | ICD-10-CM | POA: Diagnosis not present

## 2020-07-15 DIAGNOSIS — Z96649 Presence of unspecified artificial hip joint: Secondary | ICD-10-CM

## 2020-07-15 DIAGNOSIS — I129 Hypertensive chronic kidney disease with stage 1 through stage 4 chronic kidney disease, or unspecified chronic kidney disease: Secondary | ICD-10-CM | POA: Diagnosis not present

## 2020-07-15 DIAGNOSIS — M1611 Unilateral primary osteoarthritis, right hip: Secondary | ICD-10-CM | POA: Diagnosis not present

## 2020-07-15 DIAGNOSIS — G4733 Obstructive sleep apnea (adult) (pediatric): Secondary | ICD-10-CM | POA: Diagnosis not present

## 2020-07-15 DIAGNOSIS — F17210 Nicotine dependence, cigarettes, uncomplicated: Secondary | ICD-10-CM | POA: Insufficient documentation

## 2020-07-15 DIAGNOSIS — I2510 Atherosclerotic heart disease of native coronary artery without angina pectoris: Secondary | ICD-10-CM | POA: Insufficient documentation

## 2020-07-15 DIAGNOSIS — Z419 Encounter for procedure for purposes other than remedying health state, unspecified: Secondary | ICD-10-CM

## 2020-07-15 DIAGNOSIS — Z79899 Other long term (current) drug therapy: Secondary | ICD-10-CM | POA: Insufficient documentation

## 2020-07-15 DIAGNOSIS — M169 Osteoarthritis of hip, unspecified: Secondary | ICD-10-CM | POA: Diagnosis present

## 2020-07-15 DIAGNOSIS — J449 Chronic obstructive pulmonary disease, unspecified: Secondary | ICD-10-CM | POA: Diagnosis not present

## 2020-07-15 DIAGNOSIS — Z471 Aftercare following joint replacement surgery: Secondary | ICD-10-CM | POA: Diagnosis not present

## 2020-07-15 DIAGNOSIS — Z96641 Presence of right artificial hip joint: Secondary | ICD-10-CM

## 2020-07-15 DIAGNOSIS — R7303 Prediabetes: Secondary | ICD-10-CM | POA: Diagnosis not present

## 2020-07-15 DIAGNOSIS — E785 Hyperlipidemia, unspecified: Secondary | ICD-10-CM | POA: Diagnosis not present

## 2020-07-15 DIAGNOSIS — M25551 Pain in right hip: Secondary | ICD-10-CM | POA: Diagnosis present

## 2020-07-15 HISTORY — PX: TOTAL HIP ARTHROPLASTY: SHX124

## 2020-07-15 LAB — TYPE AND SCREEN
ABO/RH(D): A POS
Antibody Screen: NEGATIVE

## 2020-07-15 SURGERY — ARTHROPLASTY, HIP, TOTAL, ANTERIOR APPROACH
Anesthesia: Spinal | Site: Hip | Laterality: Right

## 2020-07-15 MED ORDER — SIMETHICONE 80 MG PO CHEW: 80.0000 mg | CHEWABLE_TABLET | Freq: Four times a day (QID) | ORAL | Status: DC | PRN

## 2020-07-15 MED ORDER — PHENYLEPHRINE HCL-NACL 10-0.9 MG/250ML-% IV SOLN
INTRAVENOUS | Status: DC | PRN
Start: 2020-07-15 — End: 2020-07-15
  Administered 2020-07-15: 50 ug/min via INTRAVENOUS

## 2020-07-15 MED ORDER — ONDANSETRON HCL 4 MG/2ML IJ SOLN
INTRAMUSCULAR | Status: DC | PRN
  Administered 2020-07-15: 4 mg via INTRAVENOUS

## 2020-07-15 MED ORDER — IRBESARTAN 150 MG PO TABS
150.0000 mg | ORAL_TABLET | Freq: Every evening | ORAL | Status: DC
  Administered 2020-07-16: 150 mg via ORAL
  Filled 2020-07-15: qty 1

## 2020-07-15 MED ORDER — LACTATED RINGERS IV SOLN: INTRAVENOUS | Status: DC

## 2020-07-15 MED ORDER — PHENOL 1.4 % MT LIQD: 1.0000 | OROMUCOSAL | Status: DC | PRN

## 2020-07-15 MED ORDER — SODIUM CHLORIDE 0.9 % IV SOLN: INTRAVENOUS | Status: DC

## 2020-07-15 MED ORDER — DEXAMETHASONE SODIUM PHOSPHATE 10 MG/ML IJ SOLN
10.0000 mg | Freq: Once | INTRAMUSCULAR | Status: AC
  Administered 2020-07-16: 10 mg via INTRAVENOUS
  Filled 2020-07-15: qty 1

## 2020-07-15 MED ORDER — EPHEDRINE SULFATE-NACL 50-0.9 MG/10ML-% IV SOSY
PREFILLED_SYRINGE | INTRAVENOUS | Status: DC | PRN
  Administered 2020-07-15 (×2): 5 mg via INTRAVENOUS
  Administered 2020-07-15: 10 mg via INTRAVENOUS

## 2020-07-15 MED ORDER — BUPIVACAINE HCL 0.25 % IJ SOLN
INTRAMUSCULAR | Status: DC | PRN
  Administered 2020-07-15: 30 mL

## 2020-07-15 MED ORDER — PROMETHAZINE HCL 25 MG/ML IJ SOLN: 6.2500 mg | INTRAMUSCULAR | Status: DC | PRN

## 2020-07-15 MED ORDER — POVIDONE-IODINE 10 % EX SWAB: 2.0000 "application " | Freq: Once | CUTANEOUS | Status: DC

## 2020-07-15 MED ORDER — BISACODYL 10 MG RE SUPP: 10.0000 mg | Freq: Every day | RECTAL | Status: DC | PRN

## 2020-07-15 MED ORDER — DIPHENHYDRAMINE HCL 12.5 MG/5ML PO ELIX: 12.5000 mg | ORAL_SOLUTION | ORAL | Status: DC | PRN

## 2020-07-15 MED ORDER — DEXAMETHASONE SODIUM PHOSPHATE 10 MG/ML IJ SOLN
8.0000 mg | Freq: Once | INTRAMUSCULAR | Status: AC
  Administered 2020-07-15: 10 mg via INTRAVENOUS

## 2020-07-15 MED ORDER — ALBUTEROL SULFATE (2.5 MG/3ML) 0.083% IN NEBU: 2.5000 mg | INHALATION_SOLUTION | Freq: Four times a day (QID) | RESPIRATORY_TRACT | Status: DC | PRN

## 2020-07-15 MED ORDER — FINASTERIDE 5 MG PO TABS
5.0000 mg | ORAL_TABLET | Freq: Every day | ORAL | Status: DC
  Administered 2020-07-15 – 2020-07-16 (×2): 5 mg via ORAL
  Filled 2020-07-15 (×2): qty 1

## 2020-07-15 MED ORDER — ACETAMINOPHEN 10 MG/ML IV SOLN
1000.0000 mg | Freq: Four times a day (QID) | INTRAVENOUS | Status: DC
  Administered 2020-07-15: 1000 mg via INTRAVENOUS
  Filled 2020-07-15: qty 100

## 2020-07-15 MED ORDER — ISOSORBIDE MONONITRATE ER 60 MG PO TB24
60.0000 mg | ORAL_TABLET | Freq: Every evening | ORAL | Status: DC
  Administered 2020-07-15 – 2020-07-16 (×2): 60 mg via ORAL
  Filled 2020-07-15 (×2): qty 1

## 2020-07-15 MED ORDER — EPHEDRINE 5 MG/ML INJ
INTRAVENOUS | Status: AC
  Filled 2020-07-15: qty 10

## 2020-07-15 MED ORDER — CELECOXIB 200 MG PO CAPS
200.0000 mg | ORAL_CAPSULE | Freq: Once | ORAL | Status: AC
  Administered 2020-07-15: 200 mg via ORAL
  Filled 2020-07-15: qty 1

## 2020-07-15 MED ORDER — DILTIAZEM HCL ER COATED BEADS 240 MG PO CP24
240.0000 mg | ORAL_CAPSULE | Freq: Every day | ORAL | Status: DC
  Administered 2020-07-16 – 2020-07-17 (×2): 240 mg via ORAL
  Filled 2020-07-15 (×3): qty 1

## 2020-07-15 MED ORDER — MIDAZOLAM HCL 2 MG/2ML IJ SOLN
INTRAMUSCULAR | Status: AC
  Filled 2020-07-15: qty 2

## 2020-07-15 MED ORDER — PROPOFOL 500 MG/50ML IV EMUL
INTRAVENOUS | Status: DC | PRN
  Administered 2020-07-15: 20 mg via INTRAVENOUS

## 2020-07-15 MED ORDER — POTASSIUM CHLORIDE CRYS ER 20 MEQ PO TBCR
20.0000 meq | EXTENDED_RELEASE_TABLET | Freq: Every day | ORAL | Status: DC
  Administered 2020-07-16 – 2020-07-17 (×2): 20 meq via ORAL
  Filled 2020-07-15 (×2): qty 1

## 2020-07-15 MED ORDER — RIVAROXABAN 10 MG PO TABS
10.0000 mg | ORAL_TABLET | Freq: Every day | ORAL | Status: DC
  Administered 2020-07-16 – 2020-07-17 (×2): 10 mg via ORAL
  Filled 2020-07-15 (×2): qty 1

## 2020-07-15 MED ORDER — TRAMADOL HCL 50 MG PO TABS: 50.0000 mg | ORAL_TABLET | Freq: Four times a day (QID) | ORAL | Status: DC | PRN

## 2020-07-15 MED ORDER — METHOCARBAMOL 500 MG IVPB - SIMPLE MED
500.0000 mg | Freq: Four times a day (QID) | INTRAVENOUS | Status: DC | PRN
  Filled 2020-07-15: qty 50

## 2020-07-15 MED ORDER — LOPERAMIDE HCL 2 MG PO CAPS: 2.0000 mg | ORAL_CAPSULE | Freq: Four times a day (QID) | ORAL | Status: DC | PRN

## 2020-07-15 MED ORDER — ALBUTEROL SULFATE (2.5 MG/3ML) 0.083% IN NEBU
3.0000 mL | INHALATION_SOLUTION | Freq: Two times a day (BID) | RESPIRATORY_TRACT | Status: DC
  Administered 2020-07-15 – 2020-07-17 (×4): 3 mL via RESPIRATORY_TRACT
  Filled 2020-07-15 (×4): qty 3

## 2020-07-15 MED ORDER — MAGNESIUM CITRATE PO SOLN: 1.0000 | Freq: Once | ORAL | Status: DC | PRN

## 2020-07-15 MED ORDER — VANCOMYCIN HCL IN DEXTROSE 1-5 GM/200ML-% IV SOLN
1000.0000 mg | INTRAVENOUS | Status: AC
  Administered 2020-07-15: 1000 mg via INTRAVENOUS
  Filled 2020-07-15: qty 200

## 2020-07-15 MED ORDER — POLYVINYL ALCOHOL 1.4 % OP SOLN
1.0000 [drp] | OPHTHALMIC | Status: DC | PRN
  Filled 2020-07-15: qty 15

## 2020-07-15 MED ORDER — PROPOFOL 1000 MG/100ML IV EMUL
INTRAVENOUS | Status: AC
  Filled 2020-07-15: qty 100

## 2020-07-15 MED ORDER — ALUM & MAG HYDROXIDE-SIMETH 200-200-20 MG/5ML PO SUSP: 30.0000 mL | ORAL | Status: DC | PRN

## 2020-07-15 MED ORDER — BUPIVACAINE HCL 0.25 % IJ SOLN
INTRAMUSCULAR | Status: AC
  Filled 2020-07-15: qty 1

## 2020-07-15 MED ORDER — METOCLOPRAMIDE HCL 5 MG/ML IJ SOLN: 5.0000 mg | Freq: Three times a day (TID) | INTRAMUSCULAR | Status: DC | PRN

## 2020-07-15 MED ORDER — MIDAZOLAM HCL 5 MG/5ML IJ SOLN
INTRAMUSCULAR | Status: DC | PRN
  Administered 2020-07-15: 1 mg via INTRAVENOUS

## 2020-07-15 MED ORDER — POLYETHYLENE GLYCOL 3350 17 G PO PACK: 17.0000 g | PACK | Freq: Every day | ORAL | Status: DC | PRN

## 2020-07-15 MED ORDER — TAMSULOSIN HCL 0.4 MG PO CAPS
0.4000 mg | ORAL_CAPSULE | Freq: Every day | ORAL | Status: DC
  Administered 2020-07-15 – 2020-07-16 (×2): 0.4 mg via ORAL
  Filled 2020-07-15 (×2): qty 1

## 2020-07-15 MED ORDER — MENTHOL 3 MG MT LOZG: 1.0000 | LOZENGE | OROMUCOSAL | Status: DC | PRN

## 2020-07-15 MED ORDER — ONDANSETRON HCL 4 MG PO TABS: 4.0000 mg | ORAL_TABLET | Freq: Four times a day (QID) | ORAL | Status: DC | PRN

## 2020-07-15 MED ORDER — PANTOPRAZOLE SODIUM 40 MG PO TBEC
80.0000 mg | DELAYED_RELEASE_TABLET | Freq: Every day | ORAL | Status: DC
  Administered 2020-07-16 – 2020-07-17 (×2): 80 mg via ORAL
  Filled 2020-07-15 (×2): qty 2

## 2020-07-15 MED ORDER — PROPOFOL 500 MG/50ML IV EMUL
INTRAVENOUS | Status: DC | PRN
  Administered 2020-07-15: 75 ug/kg/min via INTRAVENOUS

## 2020-07-15 MED ORDER — ACETAMINOPHEN 500 MG PO TABS: 1000.0000 mg | ORAL_TABLET | Freq: Once | ORAL | Status: DC

## 2020-07-15 MED ORDER — BUPIVACAINE HCL (PF) 0.75 % IJ SOLN
INTRAMUSCULAR | Status: DC | PRN
  Administered 2020-07-15: 1.8 mL

## 2020-07-15 MED ORDER — 0.9 % SODIUM CHLORIDE (POUR BTL) OPTIME
TOPICAL | Status: DC | PRN
  Administered 2020-07-15: 1000 mL

## 2020-07-15 MED ORDER — FUROSEMIDE 40 MG PO TABS
40.0000 mg | ORAL_TABLET | Freq: Every day | ORAL | Status: DC
  Administered 2020-07-16 – 2020-07-17 (×2): 40 mg via ORAL
  Filled 2020-07-15 (×2): qty 1

## 2020-07-15 MED ORDER — ORAL CARE MOUTH RINSE: 15.0000 mL | Freq: Once | OROMUCOSAL | Status: AC

## 2020-07-15 MED ORDER — AZELASTINE-FLUTICASONE 137-50 MCG/ACT NA SUSP: 1.0000 | Freq: Two times a day (BID) | NASAL | Status: DC | PRN

## 2020-07-15 MED ORDER — HYDROCODONE-ACETAMINOPHEN 5-325 MG PO TABS
1.0000 | ORAL_TABLET | ORAL | Status: DC | PRN
  Administered 2020-07-15: 2 via ORAL
  Administered 2020-07-15 (×2): 1 via ORAL
  Administered 2020-07-16 (×2): 2 via ORAL
  Administered 2020-07-17: 1 via ORAL
  Filled 2020-07-15: qty 1
  Filled 2020-07-15: qty 2
  Filled 2020-07-15 (×2): qty 1
  Filled 2020-07-15 (×2): qty 2

## 2020-07-15 MED ORDER — ACETAMINOPHEN 325 MG PO TABS: 325.0000 mg | ORAL_TABLET | Freq: Four times a day (QID) | ORAL | Status: DC | PRN

## 2020-07-15 MED ORDER — METHOCARBAMOL 500 MG PO TABS
500.0000 mg | ORAL_TABLET | Freq: Four times a day (QID) | ORAL | Status: DC | PRN
  Administered 2020-07-15 – 2020-07-17 (×3): 500 mg via ORAL
  Filled 2020-07-15 (×3): qty 1

## 2020-07-15 MED ORDER — CHLORHEXIDINE GLUCONATE 0.12 % MT SOLN
15.0000 mL | Freq: Once | OROMUCOSAL | Status: AC
  Administered 2020-07-15: 15 mL via OROMUCOSAL

## 2020-07-15 MED ORDER — ATORVASTATIN CALCIUM 10 MG PO TABS
10.0000 mg | ORAL_TABLET | Freq: Every evening | ORAL | Status: DC
  Administered 2020-07-15 – 2020-07-16 (×2): 10 mg via ORAL
  Filled 2020-07-15 (×2): qty 1

## 2020-07-15 MED ORDER — TRANEXAMIC ACID-NACL 1000-0.7 MG/100ML-% IV SOLN
1000.0000 mg | INTRAVENOUS | Status: AC
  Administered 2020-07-15: 1000 mg via INTRAVENOUS
  Filled 2020-07-15: qty 100

## 2020-07-15 MED ORDER — MORPHINE SULFATE (PF) 2 MG/ML IV SOLN: 0.5000 mg | INTRAVENOUS | Status: DC | PRN

## 2020-07-15 MED ORDER — ONDANSETRON HCL 4 MG/2ML IJ SOLN: 4.0000 mg | Freq: Four times a day (QID) | INTRAMUSCULAR | Status: DC | PRN

## 2020-07-15 MED ORDER — FLUTICASONE PROPIONATE 50 MCG/ACT NA SUSP
2.0000 | Freq: Two times a day (BID) | NASAL | Status: DC | PRN
  Filled 2020-07-15: qty 16

## 2020-07-15 MED ORDER — METOCLOPRAMIDE HCL 5 MG PO TABS: 5.0000 mg | ORAL_TABLET | Freq: Three times a day (TID) | ORAL | Status: DC | PRN

## 2020-07-15 MED ORDER — WATER FOR IRRIGATION, STERILE IR SOLN
Status: DC | PRN
  Administered 2020-07-15: 2000 mL

## 2020-07-15 MED ORDER — HYPROMELLOSE (GONIOSCOPIC) 2.5 % OP SOLN: 1.0000 [drp] | Freq: Three times a day (TID) | OPHTHALMIC | Status: DC | PRN

## 2020-07-15 MED ORDER — FENTANYL CITRATE (PF) 100 MCG/2ML IJ SOLN: 25.0000 ug | INTRAMUSCULAR | Status: DC | PRN

## 2020-07-15 MED ORDER — AZELASTINE HCL 0.1 % NA SOLN
1.0000 | Freq: Two times a day (BID) | NASAL | Status: DC | PRN
  Filled 2020-07-15: qty 30

## 2020-07-15 MED ORDER — VANCOMYCIN HCL IN DEXTROSE 1-5 GM/200ML-% IV SOLN
1000.0000 mg | Freq: Two times a day (BID) | INTRAVENOUS | Status: AC
  Administered 2020-07-15: 1000 mg via INTRAVENOUS
  Filled 2020-07-15: qty 200

## 2020-07-15 MED ORDER — DOCUSATE SODIUM 100 MG PO CAPS
100.0000 mg | ORAL_CAPSULE | Freq: Two times a day (BID) | ORAL | Status: DC
  Administered 2020-07-15 – 2020-07-17 (×3): 100 mg via ORAL
  Filled 2020-07-15 (×3): qty 1

## 2020-07-15 SURGICAL SUPPLY — 46 items
BAG DECANTER FOR FLEXI CONT (MISCELLANEOUS) IMPLANT
BAG SPEC THK2 15X12 ZIP CLS (MISCELLANEOUS)
BAG ZIPLOCK 12X15 (MISCELLANEOUS) IMPLANT
BALL HIP CERAMIC (Hips) IMPLANT
BLADE SAG 18X100X1.27 (BLADE) ×3 IMPLANT
CLOSURE WOUND 1/2 X4 (GAUZE/BANDAGES/DRESSINGS) ×2
COVER PERINEAL POST (MISCELLANEOUS) ×3 IMPLANT
COVER SURGICAL LIGHT HANDLE (MISCELLANEOUS) ×3 IMPLANT
COVER WAND RF STERILE (DRAPES) ×2 IMPLANT
CUP ACETBLR 52 OD PINNACLE (Hips) ×2 IMPLANT
DECANTER SPIKE VIAL GLASS SM (MISCELLANEOUS) ×3 IMPLANT
DRAPE STERI IOBAN 125X83 (DRAPES) ×3 IMPLANT
DRAPE U-SHAPE 47X51 STRL (DRAPES) ×6 IMPLANT
DRSG ADAPTIC 3X8 NADH LF (GAUZE/BANDAGES/DRESSINGS) ×3 IMPLANT
DRSG AQUACEL AG ADV 3.5X10 (GAUZE/BANDAGES/DRESSINGS) ×3 IMPLANT
DURAPREP 26ML APPLICATOR (WOUND CARE) ×3 IMPLANT
ELECT REM PT RETURN 15FT ADLT (MISCELLANEOUS) ×3 IMPLANT
EVACUATOR 1/8 PVC DRAIN (DRAIN) IMPLANT
GLOVE BIO SURGEON STRL SZ 6 (GLOVE) IMPLANT
GLOVE BIO SURGEON STRL SZ7 (GLOVE) IMPLANT
GLOVE BIO SURGEON STRL SZ8 (GLOVE) ×3 IMPLANT
GLOVE BIOGEL PI IND STRL 6.5 (GLOVE) IMPLANT
GLOVE BIOGEL PI IND STRL 7.0 (GLOVE) IMPLANT
GLOVE BIOGEL PI IND STRL 8 (GLOVE) ×1 IMPLANT
GLOVE BIOGEL PI INDICATOR 6.5 (GLOVE)
GLOVE BIOGEL PI INDICATOR 7.0 (GLOVE)
GLOVE BIOGEL PI INDICATOR 8 (GLOVE) ×2
GOWN STRL REUS W/TWL LRG LVL3 (GOWN DISPOSABLE) ×3 IMPLANT
GOWN STRL REUS W/TWL XL LVL3 (GOWN DISPOSABLE) IMPLANT
HIP BALL CERAMIC (Hips) ×3 IMPLANT
HOLDER FOLEY CATH W/STRAP (MISCELLANEOUS) ×3 IMPLANT
KIT TURNOVER KIT A (KITS) IMPLANT
LINER MARATHON NEUT +4X52X32 (Hips) ×2 IMPLANT
MANIFOLD NEPTUNE II (INSTRUMENTS) ×3 IMPLANT
PACK ANTERIOR HIP CUSTOM (KITS) ×3 IMPLANT
PENCIL SMOKE EVACUATOR COATED (MISCELLANEOUS) ×3 IMPLANT
STEM FEMORAL SZ6 HIGH ACTIS (Stem) ×2 IMPLANT
STRIP CLOSURE SKIN 1/2X4 (GAUZE/BANDAGES/DRESSINGS) ×3 IMPLANT
SUT ETHIBOND NAB CT1 #1 30IN (SUTURE) ×3 IMPLANT
SUT MNCRL AB 4-0 PS2 18 (SUTURE) ×3 IMPLANT
SUT STRATAFIX 0 PDS 27 VIOLET (SUTURE) ×3
SUT VIC AB 2-0 CT1 27 (SUTURE) ×6
SUT VIC AB 2-0 CT1 TAPERPNT 27 (SUTURE) ×2 IMPLANT
SUTURE STRATFX 0 PDS 27 VIOLET (SUTURE) ×1 IMPLANT
SYR 50ML LL SCALE MARK (SYRINGE) IMPLANT
TRAY FOLEY MTR SLVR 16FR STAT (SET/KITS/TRAYS/PACK) ×3 IMPLANT

## 2020-07-15 NOTE — Care Plan (Signed)
Ortho Bundle Case Management Note  Patient Details  Name: Dean [name]. MRN: [id number] Date of Birth: September 20, 1946  R THA on 07-15-20 DCP:  Home with hired caregiver.  2 story home with chair lift.  Has 2 ste.  DME:  No needs.  Has a RW and borrowing a 3-in-1. PT:  HEP                   DME Arranged:  N/A DME Agency:  NA  HH Arranged:  NA HH Agency:  NA  Additional Comments: Please contact me with any questions of if this plan should need to change.  Marianne Sofia, RN,CCM [facility]  [phone] 07/15/2020, 3:54 PM

## 2020-07-15 NOTE — Interval H&P Note (Signed)
History and Physical Interval Note:  07/15/2020 6:24 AM  Dean [name].  has presented today for surgery, with the diagnosis of right hip osteoarthritis.  The various methods of treatment have been discussed with the patient and family. After consideration of risks, benefits and other options for treatment, the patient has consented to  Procedure(s) with comments: [place] (Right) - 169min as a surgical intervention.  The patient's history has been reviewed, patient examined, no change in status, stable for surgery.  I have reviewed the patient's chart and labs.  Questions were answered to the patient's satisfaction.     Pilar Plate Carine Nordgren

## 2020-07-15 NOTE — Anesthesia Procedure Notes (Signed)
Date/Time: 07/15/2020 7:13 AM Performed by: Glory Buff, CRNA Oxygen Delivery Method: Simple face mask

## 2020-07-15 NOTE — Anesthesia Postprocedure Evaluation (Signed)
Anesthesia Post Note  Patient: Dean [name].  Procedure(s) Performed: TOTAL HIP ARTHROPLASTY ANTERIOR APPROACH (Right Hip)     Patient location during evaluation: PACU Anesthesia Type: Spinal Level of consciousness: awake and alert Pain management: pain level controlled Vital Signs Assessment: post-procedure vital signs reviewed and stable Respiratory status: spontaneous breathing and respiratory function stable Cardiovascular status: blood pressure returned to baseline and stable Postop Assessment: spinal receding Anesthetic complications: no   No complications documented.  Last Vitals:  Vitals:   07/15/20 1015 07/15/20 1030  BP: (!) 117/53 (!) 107/55  Pulse: 49 58  Resp: 12 18  Temp:    SpO2: 91% 92%    Last Pain:  Vitals:   07/15/20 1015  TempSrc:   PainSc: Asleep                 Jawad Wiacek DANIEL

## 2020-07-15 NOTE — Op Note (Signed)
OPERATIVE REPORT- TOTAL HIP ARTHROPLASTY   PREOPERATIVE DIAGNOSIS: Osteoarthritis of the Right hip.   POSTOPERATIVE DIAGNOSIS: Osteoarthritis of the Right  hip.   PROCEDURE: Right total hip arthroplasty, anterior approach.   SURGEON: Gaynelle Arabian, MD   ASSISTANT: Griffith Citron, PA-C  ANESTHESIA:  Spinal  ESTIMATED BLOOD LOSS:-200 mL    DRAINS: Hemovac x1.   COMPLICATIONS: None   CONDITION: PACU - hemodynamically stable.   BRIEF CLINICAL NOTE: Dean [name]. is a 74 y.o. male who has advanced end-  stage arthritis of their Right  hip with progressively worsening pain and  dysfunction.The patient has failed nonoperative management and presents for  total hip arthroplasty.   PROCEDURE IN DETAIL: After successful administration of spinal  anesthetic, the traction boots for the [facility] bed were placed on both  feet and the patient was placed onto the [facility] bed, boots placed into the leg  holders. The Right hip was then isolated from the perineum with plastic  drapes and prepped and draped in the usual sterile fashion. ASIS and  greater trochanter were marked and a oblique incision was made, starting  at about 1 cm lateral and 2 cm distal to the ASIS and coursing towards  the anterior cortex of the femur. The skin was cut with a 10 blade  through subcutaneous tissue to the level of the fascia overlying the  tensor fascia lata muscle. The fascia was then incised in line with the  incision at the junction of the anterior third and posterior 2/3rd. The  muscle was teased off the fascia and then the interval between the TFL  and the rectus was developed. The Hohmann retractor was then placed at  the top of the femoral neck over the capsule. The vessels overlying the  capsule were cauterized and the fat on top of the capsule was removed.  A Hohmann retractor was then placed anterior underneath the rectus  femoris to give exposure to the entire anterior capsule. A  T-shaped  capsulotomy was performed. The edges were tagged and the femoral head  was identified.       Osteophytes are removed off the superior acetabulum.  The femoral neck was then cut in situ with an oscillating saw. Traction  was then applied to the left lower extremity utilizing the [facility]  traction. The femoral head was then removed. Retractors were placed  around the acetabulum and then circumferential removal of the labrum was  performed. Osteophytes were also removed. Reaming starts at 47 mm to  medialize and  Increased in 2 mm increments to 51 mm. We reamed in  approximately 40 degrees of abduction, 20 degrees anteversion. A 52 mm  pinnacle acetabular shell was then impacted in anatomic position under  fluoroscopic guidance with excellent purchase. We did not need to place  any additional dome screws. A 32 mm neutral + 4 marathon liner was then  placed into the acetabular shell.       The femoral lift was then placed along the lateral aspect of the femur  just distal to the vastus ridge. The leg was  externally rotated and capsule  was stripped off the inferior aspect of the femoral neck down to the  level of the lesser trochanter, this was done with electrocautery. The femur was lifted after this was performed. The  leg was then placed in an extended and adducted position essentially delivering the femur. We also removed the capsule superiorly and the piriformis from the  piriformis fossa to gain excellent exposure of the  proximal femur. Rongeur was used to remove some cancellous bone to get  into the lateral portion of the proximal femur for placement of the  initial starter reamer. The starter broaches was placed  the starter broach  and was shown to go down the center of the canal. Broaching  with the Actis system was then performed starting at size 0  coursing  Up to size 6. A size 6 had excellent torsional and rotational  and axial stability. The trial high offset neck was then  placed  with a 32 + 5 trial head. The hip was then reduced. We confirmed that  the stem was in the canal both on AP and lateral x-rays. It also has excellent sizing. The hip was reduced with outstanding stability through full extension and full external rotation.. AP pelvis was taken and the leg lengths were measured and found to be equal. Hip was then dislocated again and the femoral head and neck removed. The  femoral broach was removed. Size 6 Actis stem with a high offset  neck was then impacted into the femur following native anteversion. Has  excellent purchase in the canal. Excellent torsional and rotational and  axial stability. It is confirmed to be in the canal on AP and lateral  fluoroscopic views. The 32 + 5 ceramic head was placed and the hip  reduced with outstanding stability. Again AP pelvis was taken and it  confirmed that the leg lengths were equal. The wound was then copiously  irrigated with saline solution and the capsule reattached and repaired  with Ethibond suture. 30 ml of .25% Bupivicaine was  injected into the capsule and into the edge of the tensor fascia lata as well as subcutaneous tissue. The fascia overlying the tensor fascia lata was then closed with a running #1 V-Loc. Subcu was closed with interrupted 2-0 Vicryl and subcuticular running 4-0 Monocryl. Incision was cleaned  and dried. Steri-Strips and a bulky sterile dressing applied. Hemovac  drain was hooked to suction and then the patient was awakened and transported to  recovery in stable condition.        Please note that a surgical assistant was a medical necessity for this procedure to perform it in a safe and expeditious manner. Assistant was necessary to provide appropriate retraction of vital neurovascular structures and to prevent femoral fracture and allow for anatomic placement of the prosthesis.  Gaynelle Arabian, M.D.

## 2020-07-15 NOTE — Transfer of Care (Signed)
Immediate Anesthesia Transfer of Care Note  Patient: Dean [name].  Procedure(s) Performed: TOTAL HIP ARTHROPLASTY ANTERIOR APPROACH (Right Hip)  Patient Location: PACU  Anesthesia Type:Spinal  Level of Consciousness: drowsy, patient cooperative and responds to stimulation  Airway & Oxygen Therapy: Patient Spontanous Breathing and Patient connected to face mask oxygen  Post-op Assessment: Report given to RN and Post -op Vital signs reviewed and stable  Post vital signs: Reviewed and stable  Last Vitals:  Vitals Value Taken Time  BP 111/55 07/15/20 0848  Temp    Pulse 50 07/15/20 0851  Resp 14 07/15/20 0851  SpO2 98 % 07/15/20 0851  Vitals shown include unvalidated device data.  Last Pain:  Vitals:   07/15/20 0649  TempSrc: Oral  PainSc:       Patients Stated Pain Goal: 4 (43/83/81 8403)  Complications: No complications documented.

## 2020-07-15 NOTE — Anesthesia Procedure Notes (Signed)
Spinal  Patient location during procedure: OR Start time: 07/15/2020 7:13 AM End time: 07/15/2020 7:21 AM Staffing Performed: anesthesiologist  Anesthesiologist: Duane Boston, MD Preanesthetic Checklist Completed: patient identified, IV checked, risks and benefits discussed, surgical consent, monitors and equipment checked, pre-op evaluation and timeout performed Spinal Block Patient position: sitting Prep: DuraPrep Patient monitoring: cardiac monitor, continuous pulse ox and blood pressure Approach: midline Location: L2-3 Injection technique: single-shot Needle Needle type: Pencan  Needle gauge: 24 G Needle length: 9 cm Additional Notes Functioning IV was confirmed and monitors were applied. Sterile prep and drape, including hand hygiene and sterile gloves were used. The patient was positioned and the spine was prepped. The skin was anesthetized with lidocaine.  Free flow of clear CSF was obtained prior to injecting local anesthetic into the CSF.  The spinal needle aspirated freely following injection.  The needle was carefully withdrawn.  The patient tolerated the procedure well.

## 2020-07-15 NOTE — Progress Notes (Signed)
Placed pt on cpap 2l o2 bled in on auto settings

## 2020-07-15 NOTE — Evaluation (Signed)
Physical Therapy Evaluation Patient Details Name: Dean [name]. MRN: [id number] DOB: 06/21/1946 Today's Date: 07/15/2020   History of Present Illness  Patient is 74 y.o. male s/p Rt THA anterior approach on 07/15/20 with PMH significant for HTN, HLD, GERD, COPD, CAD, AAA, OA, A-fib, Asthma.    Clinical Impression  Dean [name]. is a 74 y.o. male POD 0 s/p Rt THA. Patient reports independence with RW for mobility at baseline. Patient is now limited by functional impairments (see PT problem list below) and requires min assist for transfers and gait with RW. Patient was able to ambulate ~40 feet with RW and min assist. Patient instructed in exercise to facilitate ROM and circulation. Patient will benefit from continued skilled PT interventions to address impairments and progress towards PLOF. Acute PT will follow to progress mobility and stair training in preparation for safe discharge home.     Follow Up Recommendations Follow surgeon's recommendation for DC plan and follow-up therapies    Equipment Recommendations  None recommended by PT    Recommendations for Other Services       Precautions / Restrictions Precautions Precautions: Fall Restrictions Weight Bearing Restrictions: No Other Position/Activity Restrictions: WBAT      Mobility  Bed Mobility Overal bed mobility: Needs Assistance Bed Mobility: Supine to Sit     Supine to sit: Min assist     General bed mobility comments: VC's for use of bed rail, assist to bring LE's off EOB.  Transfers Overall transfer level: Needs assistance Equipment used: Rolling walker (2 wheeled) Transfers: Sit to/from Stand Sit to Stand: Min assist         General transfer comment: cues for technique with RW, pt initiated power up with single UE, assist to steady with rise.  Ambulation/Gait Ambulation/Gait assistance: Min assist;Min guard Gait Distance (Feet): 40 Feet Assistive device: Rolling walker (2 wheeled) Gait  Pattern/deviations: Step-to pattern;Decreased stride length;Decreased weight shift to right Gait velocity: decr   General Gait Details: VC's for safe step pattern and proximity, no overt LOB noted. pt maintained safe proxmity throughout.  Stairs            Wheelchair Mobility    Modified Rankin (Stroke Patients Only)       Balance Overall balance assessment: Needs assistance Sitting-balance support: Feet supported Sitting balance-Leahy Scale: Good     Standing balance support: During functional activity;Bilateral upper extremity supported Standing balance-Leahy Scale: Fair                  Pertinent Vitals/Pain Pain Assessment: 0-10 Pain Score: 3  Pain Location: Rt hip Pain Descriptors / Indicators: Aching;Discomfort Pain Intervention(s): Limited activity within patient's tolerance;Monitored during session;Patient requesting pain meds-RN notified;Repositioned    Home Living Family/patient expects to be discharged to:: Private residence Living Arrangements: Alone Available Help at Discharge: Family Type of Home: House Home Access: Stairs to enter Entrance Stairs-Rails: None Entrance Stairs-Number of Steps: 1+1 Home Layout: Two level;Bed/bath upstairs;1/2 bath on main level Home Equipment: Walker - 2 wheels;Cane - single point;Grab bars - tub/shower Additional Comments: Pt's brother is going to stay with him.    Prior Function Level of Independence: Independent with assistive device(s)         Comments: using RW for 1 year or more. pt is a retired history Pharmacist, hospital.     Hand Dominance   Dominant Hand: Right    Extremity/Trunk Assessment   Upper Extremity Assessment Upper Extremity Assessment: Overall WFL for tasks assessed  Lower Extremity Assessment Lower Extremity Assessment: RLE deficits/detail;Overall WFL for tasks assessed RLE Deficits / Details: good quad activation RLE Sensation: WNL RLE Coordination: WNL    Cervical / Trunk  Assessment Cervical / Trunk Assessment: Normal  Communication   Communication: No difficulties  Cognition Arousal/Alertness: Awake/alert Behavior During Therapy: WFL for tasks assessed/performed Overall Cognitive Status: Within Functional Limits for tasks assessed             General Comments      Exercises Total Joint Exercises Ankle Circles/Pumps: AROM;Both;20 reps;Seated   Assessment/Plan    PT Assessment Patient needs continued PT services  PT Problem List Decreased strength;Decreased range of motion;Decreased activity tolerance;Decreased balance;Decreased mobility;Decreased knowledge of use of DME;Decreased knowledge of precautions       PT Treatment Interventions DME instruction;Gait training;Stair training;Functional mobility training;Therapeutic activities;Therapeutic exercise;Balance training;Patient/family education    PT Goals (Current goals can be found in the Care Plan section)  Acute Rehab PT Goals Patient Stated Goal: be able to travel again PT Goal Formulation: With patient Time For Goal Achievement: 07/22/20 Potential to Achieve Goals: Good    Frequency 7X/week    AM-PAC PT "6 Clicks" Mobility  Outcome Measure Help needed turning from your back to your side while in a flat bed without using bedrails?: A Little Help needed moving from lying on your back to sitting on the side of a flat bed without using bedrails?: A Little Help needed moving to and from a bed to a chair (including a wheelchair)?: A Little Help needed standing up from a chair using your arms (e.g., wheelchair or bedside chair)?: A Little Help needed to walk in hospital room?: A Little Help needed climbing 3-5 steps with a railing? : A Lot 6 Click Score: 17    End of Session Equipment Utilized During Treatment: Gait belt Activity Tolerance: Patient tolerated treatment well Patient left: in chair;with call bell/phone within reach;with chair alarm set Nurse Communication: Mobility  status PT Visit Diagnosis: Difficulty in walking, not elsewhere classified (R26.2);Muscle weakness (generalized) (M62.81)    Time: 7711-6579 PT Time Calculation (min) (ACUTE ONLY): 27 min   Charges:   PT Evaluation $PT Eval Low Complexity: 1 Low PT Treatments $Gait Training: 8-22 mins        Verner Mould, DPT Acute Rehabilitation Services  Office [phone] Pager [phone]  07/15/2020 4:54 PM

## 2020-07-15 NOTE — Discharge Instructions (Addendum)
Gaynelle Arabian, MD Total Joint Specialist [facility] [address] [place], [place] [address] [phone]  ANTERIOR APPROACH TOTAL HIP REPLACEMENT POSTOPERATIVE DIRECTIONS     Hip Rehabilitation, Guidelines Following Surgery  The results of a hip operation are greatly improved after range of motion and muscle strengthening exercises. Follow all safety measures which are given to protect your hip. If any of these exercises cause increased pain or swelling in your joint, decrease the amount until you are comfortable again. Then slowly increase the exercises. Call your caregiver if you have problems or questions.   BLOOD CLOT PREVENTION . Take a 10 mg Xarelto once a day for three weeks following surgery. Then resume one 325 mg Aspirin once a day. . You may resume your vitamins/supplements once you have discontinued the Xarelto. . Do not take any NSAIDs (Advil, Aleve, Ibuprofen, Meloxicam, etc.) until you have discontinued the Xarelto.   HOME CARE INSTRUCTIONS  . Remove items at home which could result in a fall. This includes throw rugs or furniture in walking pathways.   ICE to the affected hip as frequently as 20-30 minutes an hour and then as needed for pain and swelling. Continue to use ice on the hip for pain and swelling from surgery. You may notice swelling that will progress down to the foot and ankle. This is normal after surgery. Elevate the leg when you are not up walking on it.    Continue to use the breathing machine which will help keep your temperature down.  It is common for your temperature to cycle up and down following surgery, especially at night when you are not up moving around and exerting yourself.  The breathing machine keeps your lungs expanded and your temperature down.  DIET You may resume your previous home diet once your are discharged from the hospital.  DRESSING / WOUND CARE / SHOWERING . You have an adhesive waterproof bandage  over the incision. Leave this in place until your first follow-up appointment. Once you remove this you will not need to place another bandage.  . You may begin showering 3 days following surgery, but do not submerge the incision under water.  ACTIVITY . For the first 3-5 days, it is important to rest and keep the operative leg elevated. You should, as a general rule, rest for 50 minutes and walk/stretch for 10 minutes per hour. After 5 days, you may slowly increase activity as tolerated.  Marland Kitchen Perform the exercises you were provided twice a day for about 15-20 minutes each session. Begin these 2 days following surgery. . Walk with your walker as instructed. Use the walker until you are comfortable transitioning to a cane. Walk with the cane in the opposite hand of the operative leg. You may discontinue the cane once you are comfortable and walking steadily. . Avoid periods of inactivity such as sitting longer than an hour when not asleep. This helps prevent blood clots.  . Do not drive a car for 6 weeks or until released by your surgeon.  . Do not drive while taking narcotics.  TED HOSE STOCKINGS Wear the elastic stockings on both legs for three weeks following surgery during the day. You may remove them at night while sleeping.  WEIGHT BEARING Weight bearing as tolerated with assist device (walker, cane, etc) as directed, use it as long as suggested by your surgeon or therapist, typically at least 4-6 weeks.  POSTOPERATIVE CONSTIPATION PROTOCOL Constipation - defined medically as fewer than three  stools per week and severe constipation as less than one stool per week.  One of the most common issues patients have following surgery is constipation.  Even if you have a regular bowel pattern at home, your normal regimen is likely to be disrupted due to multiple reasons following surgery.  Combination of anesthesia, postoperative narcotics, change in appetite and fluid intake all can affect your  bowels.  In order to avoid complications following surgery, here are some recommendations in order to help you during your recovery period.  . Colace (docusate) - Pick up an over-the-counter form of Colace or another stool softener and take twice a day as long as you are requiring postoperative pain medications.  Take with a full glass of water daily.  If you experience loose stools or diarrhea, hold the colace until you stool forms back up.  If your symptoms do not get better within 1 week or if they get worse, check with your doctor. . Dulcolax (bisacodyl) - Pick up over-the-counter and take as directed by the product packaging as needed to assist with the movement of your bowels.  Take with a full glass of water.  Use this product as needed if not relieved by Colace only.  . MiraLax (polyethylene glycol) - Pick up over-the-counter to have on hand.  MiraLax is a solution that will increase the amount of water in your bowels to assist with bowel movements.  Take as directed and can mix with a glass of water, juice, soda, coffee, or tea.  Take if you go more than two days without a movement.Do not use MiraLax more than once per day. Call your doctor if you are still constipated or irregular after using this medication for 7 days in a row.  If you continue to have problems with postoperative constipation, please contact the office for further assistance and recommendations.  If you experience "the worst abdominal pain ever" or develop nausea or vomiting, please contact the office immediatly for further recommendations for treatment.  ITCHING  If you experience itching with your medications, try taking only a single pain pill, or even half a pain pill at a time.  You can also use Benadryl over the counter for itching or also to help with sleep.   MEDICATIONS See your medication summary on the "After Visit Summary" that the nursing staff will review with you prior to discharge.  You may have some home  medications which will be placed on hold until you complete the course of blood thinner medication.  It is important for you to complete the blood thinner medication as prescribed by your surgeon.  Continue your approved medications as instructed at time of discharge.  PRECAUTIONS If you experience chest pain or shortness of breath - call 911 immediately for transfer to the hospital emergency department.  If you develop a fever greater that 101 F, purulent drainage from wound, increased redness or drainage from wound, foul odor from the wound/dressing, or calf pain - CONTACT YOUR SURGEON.                                                   FOLLOW-UP APPOINTMENTS Make sure you keep all of your appointments after your operation with your surgeon and caregivers. You should call the office at the above phone number and make an appointment for approximately two  weeks after the date of your surgery or on the date instructed by your surgeon outlined in the "After Visit Summary".  RANGE OF MOTION AND STRENGTHENING EXERCISES  These exercises are designed to help you keep full movement of your hip joint. Follow your caregiver's or physical therapist's instructions. Perform all exercises about fifteen times, three times per day or as directed. Exercise both hips, even if you have had only one joint replacement. These exercises can be done on a training (exercise) mat, on the floor, on a table or on a bed. Use whatever works the best and is most comfortable for you. Use music or television while you are exercising so that the exercises are a pleasant break in your day. This will make your life better with the exercises acting as a break in routine you can look forward to.  . Lying on your back, slowly slide your foot toward your buttocks, raising your knee up off the floor. Then slowly slide your foot back down until your leg is straight again.  . Lying on your back spread your legs as far apart as you can without  causing discomfort.  . Lying on your side, raise your upper leg and foot straight up from the floor as far as is comfortable. Slowly lower the leg and repeat.  . Lying on your back, tighten up the muscle in the front of your thigh (quadriceps muscles). You can do this by keeping your leg straight and trying to raise your heel off the floor. This helps strengthen the largest muscle supporting your knee.  . Lying on your back, tighten up the muscles of your buttocks both with the legs straight and with the knee bent at a comfortable angle while keeping your heel on the floor.   IF YOU ARE TRANSFERRED TO A SKILLED REHAB FACILITY If the patient is transferred to a skilled rehab facility following release from the hospital, a list of the current medications will be sent to the facility for the patient to continue.  When discharged from the skilled rehab facility, please have the facility set up the patient's [place] prior to being released. Also, the skilled facility will be responsible for providing the patient with their medications at time of release from the facility to include their pain medication, the muscle relaxants, and their blood thinner medication. If the patient is still at the rehab facility at time of the two week follow up appointment, the skilled rehab facility will also need to assist the patient in arranging follow up appointment in our office and any transportation needs.  MAKE SURE YOU:  . Understand these instructions.  . Get help right away if you are not doing well or get worse.    DENTAL ANTIBIOTICS:  In most cases prophylactic antibiotics for Dental procdeures after total joint surgery are not necessary.  Exceptions are as follows:  1. History of prior total joint infection  2. Severely immunocompromised (Organ Transplant, cancer chemotherapy, Rheumatoid biologic meds such as [place])  3. Poorly controlled diabetes (A1C &gt; 8.0, blood glucose over  200)  If you have one of these conditions, contact your surgeon for an antibiotic prescription, prior to your dental procedure.    Pick up stool softner and laxative for home use following surgery while on pain medications. Do not submerge incision under water. Please use good hand washing techniques while changing dressing each day. May shower starting three days after surgery. Please use a clean towel to pat  the incision dry following showers. Continue to use ice for pain and swelling after surgery. Do not use any lotions or creams on the incision until instructed by your surgeon.   Information on my medicine - XARELTO (Rivaroxaban)   Why was Xarelto prescribed for you? Xarelto was prescribed for you to reduce the risk of blood clots forming after orthopedic surgery. The medical term for these abnormal blood clots is venous thromboembolism (VTE).  What do you need to know about xarelto ? Take your Xarelto ONCE DAILY at the same time every day. You may take it either with or without food.  If you have difficulty swallowing the tablet whole, you may crush it and mix in applesauce just prior to taking your dose.  Take Xarelto exactly as prescribed by your doctor and DO NOT stop taking Xarelto without talking to the doctor who prescribed the medication.  Stopping without other VTE prevention medication to take the place of Xarelto may increase your risk of developing a clot.  After discharge, you should have regular check-up appointments with your healthcare provider that is prescribing your Xarelto.    What do you do if you miss a dose? If you miss a dose, take it as soon as you remember on the same day then continue your regularly scheduled once daily regimen the next day. Do not take two doses of Xarelto on the same day.   Important Safety Information A possible side effect of Xarelto is bleeding. You should call your healthcare provider right away if you experience any  of the following: ? Bleeding from an injury or your nose that does not stop. ? Unusual colored urine (red or dark brown) or unusual colored stools (red or black). ? Unusual bruising for unknown reasons. ? A serious fall or if you hit your head (even if there is no bleeding).  Some medicines may interact with Xarelto and might increase your risk of bleeding while on Xarelto. To help avoid this, consult your healthcare provider or pharmacist prior to using any new prescription or non-prescription medications, including herbals, vitamins, non-steroidal anti-inflammatory drugs (NSAIDs) and supplements.  This website has more information on Xarelto: [email or web address]

## 2020-07-16 ENCOUNTER — Encounter (HOSPITAL_COMMUNITY): Payer: Self-pay | Admitting: Orthopedic Surgery

## 2020-07-16 ENCOUNTER — Observation Stay (HOSPITAL_COMMUNITY): Payer: Medicare PPO

## 2020-07-16 DIAGNOSIS — R7303 Prediabetes: Secondary | ICD-10-CM | POA: Diagnosis not present

## 2020-07-16 DIAGNOSIS — I2510 Atherosclerotic heart disease of native coronary artery without angina pectoris: Secondary | ICD-10-CM | POA: Diagnosis not present

## 2020-07-16 DIAGNOSIS — Z86010 Personal history of colonic polyps: Secondary | ICD-10-CM | POA: Diagnosis not present

## 2020-07-16 DIAGNOSIS — J449 Chronic obstructive pulmonary disease, unspecified: Secondary | ICD-10-CM | POA: Diagnosis not present

## 2020-07-16 DIAGNOSIS — F17210 Nicotine dependence, cigarettes, uncomplicated: Secondary | ICD-10-CM | POA: Diagnosis not present

## 2020-07-16 DIAGNOSIS — R918 Other nonspecific abnormal finding of lung field: Secondary | ICD-10-CM | POA: Diagnosis not present

## 2020-07-16 DIAGNOSIS — M1611 Unilateral primary osteoarthritis, right hip: Secondary | ICD-10-CM | POA: Diagnosis not present

## 2020-07-16 DIAGNOSIS — Z7982 Long term (current) use of aspirin: Secondary | ICD-10-CM | POA: Diagnosis not present

## 2020-07-16 DIAGNOSIS — I129 Hypertensive chronic kidney disease with stage 1 through stage 4 chronic kidney disease, or unspecified chronic kidney disease: Secondary | ICD-10-CM | POA: Diagnosis not present

## 2020-07-16 DIAGNOSIS — N189 Chronic kidney disease, unspecified: Secondary | ICD-10-CM | POA: Diagnosis not present

## 2020-07-16 LAB — BASIC METABOLIC PANEL
Anion gap: 8 (ref 5–15)
BUN: 18 mg/dL (ref 8–23)
CO2: 28 mmol/L (ref 22–32)
Calcium: 8.8 mg/dL — ABNORMAL LOW (ref 8.9–10.3)
Chloride: 102 mmol/L (ref 98–111)
Creatinine, Ser: 1.39 mg/dL — ABNORMAL HIGH (ref 0.61–1.24)
GFR calc Af Amer: 58 mL/min — ABNORMAL LOW (ref >60)
GFR calc non Af Amer: 50 mL/min — ABNORMAL LOW (ref >60)
Glucose, Bld: 153 mg/dL — ABNORMAL HIGH (ref 70–99)
Potassium: 4.2 mmol/L (ref 3.5–5.1)
Sodium: 138 mmol/L (ref 135–145)

## 2020-07-16 LAB — CBC
HCT: 35.5 % — ABNORMAL LOW (ref 39.0–52.0)
Hemoglobin: 11.3 g/dL — ABNORMAL LOW (ref 13.0–17.0)
MCH: 31.1 pg (ref 26.0–34.0)
MCHC: 31.8 g/dL (ref 30.0–36.0)
MCV: 97.8 fL (ref 80.0–100.0)
Platelets: 189 10*3/uL (ref 150–400)
RBC: 3.63 MIL/uL — ABNORMAL LOW (ref 4.22–5.81)
RDW: 14.4 % (ref 11.5–15.5)
WBC: 17 10*3/uL — ABNORMAL HIGH (ref 4.0–10.5)
nRBC: 0 % (ref 0.0–0.2)

## 2020-07-16 MED ORDER — HYDROCODONE-ACETAMINOPHEN 5-325 MG PO TABS: 1.0000 | ORAL_TABLET | Freq: Four times a day (QID) | ORAL | 0 refills | Status: DC | PRN

## 2020-07-16 MED ORDER — RIVAROXABAN 10 MG PO TABS: 10.0000 mg | ORAL_TABLET | Freq: Every day | ORAL | 0 refills | Status: DC

## 2020-07-16 MED ORDER — METHOCARBAMOL 500 MG PO TABS: 500.0000 mg | ORAL_TABLET | Freq: Four times a day (QID) | ORAL | 0 refills | Status: DC | PRN

## 2020-07-16 NOTE — Progress Notes (Signed)
Note pt is Ortho Bundle and all DME and follow up arranged PTA.  No TOC needs.  Cheryn Lundquist, LCSW

## 2020-07-16 NOTE — Progress Notes (Signed)
Physical Therapy Treatment Patient Details Name: Dean [name]. MRN: [id number] DOB: [birth year] Today's Date: 07/16/2020    History of Present Illness Patient is 74 y.o. male s/p Rt THA anterior approach on 07/15/20 with PMH significant for HTN, HLD, GERD, COPD, CAD, AAA, OA, A-fib, Asthma.    PT Comments    Pt ambulated 120' with RW, SaO2 85% on room air, 90% on 3L O2 [place].   Follow Up Recommendations  Follow surgeon's recommendation for DC plan and follow-up therapies     Equipment Recommendations  None recommended by PT    Recommendations for Other Services       Precautions / Restrictions Precautions Precautions: Fall Precaution Comments: monitor O2 Restrictions Weight Bearing Restrictions: No Other Position/Activity Restrictions: WBAT    Mobility  Bed Mobility Overal bed mobility: Modified Independent Bed Mobility: Supine to Sit     Supine to sit: Modified independent (Device/Increase time)     General bed mobility comments: up in recliner  Transfers Overall transfer level: Needs assistance Equipment used: Rolling walker (2 wheeled) Transfers: Sit to/from Stand Sit to Stand: Supervision         General transfer comment: VCs hand placement  Ambulation/Gait Ambulation/Gait assistance: Supervision Gait Distance (Feet): 120 Feet Assistive device: Rolling walker (2 wheeled) Gait Pattern/deviations: Step-to pattern;Decreased stride length;Decreased weight shift to right Gait velocity: decr   General Gait Details: SaO2 85% on room air walking, 90% on 3L O2 walking, no loss of balance, VCs sequencing   Stairs Stairs: Yes Stairs assistance: Min guard Stair Management: No rails;Step to pattern;Forwards;With walker Number of Stairs: 1 General stair comments: VCs sequencing, min/guard safety   Wheelchair Mobility    Modified Rankin (Stroke Patients Only)       Balance Overall balance assessment: Needs assistance Sitting-balance support: Feet  supported Sitting balance-Leahy Scale: Good     Standing balance support: During functional activity;Bilateral upper extremity supported Standing balance-Leahy Scale: Fair                              Cognition Arousal/Alertness: Awake/alert Behavior During Therapy: WFL for tasks assessed/performed Overall Cognitive Status: Within Functional Limits for tasks assessed                                        Exercises Total Joint Exercises Ankle Circles/Pumps: AROM;Both;20 reps;Seated Quad Sets: AROM;Right;5 reps;Supine Short Arc Quad: AROM;Right;10 reps;Supine Heel Slides: AAROM;Right;10 reps;Supine Hip ABduction/ADduction: AAROM;Right;10 reps;Supine Long Arc Quad: AROM;Right;10 reps;Supine    General Comments        Pertinent Vitals/Pain Pain Assessment: 0-10 Pain Score: 1  Pain Location: Rt hip Pain Descriptors / Indicators: Aching;Discomfort Pain Intervention(s): Limited activity within patient's tolerance;Monitored during session;Ice applied    Home Living                      Prior Function            PT Goals (current goals can now be found in the care plan section) Acute Rehab PT Goals Patient Stated Goal: be able to travel again PT Goal Formulation: With patient Time For Goal Achievement: 07/22/20 Potential to Achieve Goals: Good Progress towards PT goals: Progressing toward goals    Frequency    7X/week      PT Plan Current plan remains appropriate    Co-evaluation  AM-PAC PT "6 Clicks" Mobility   Outcome Measure  Help needed turning from your back to your side while in a flat bed without using bedrails?: A Little Help needed moving from lying on your back to sitting on the side of a flat bed without using bedrails?: A Little Help needed moving to and from a bed to a chair (including a wheelchair)?: None Help needed standing up from a chair using your arms (e.g., wheelchair or bedside  chair)?: None Help needed to walk in hospital room?: None Help needed climbing 3-5 steps with a railing? : A Little 6 Click Score: 21    End of Session Equipment Utilized During Treatment: Gait belt Activity Tolerance: Patient tolerated treatment well Patient left: in chair;with call bell/phone within reach;with chair alarm set Nurse Communication: Mobility status;Other (comment) (hypoxic with activity) PT Visit Diagnosis: Difficulty in walking, not elsewhere classified (R26.2);Muscle weakness (generalized) (M62.81)     Time: 9292-4462 PT Time Calculation (min) (ACUTE ONLY): 28 min  Charges:  $Gait Training: 8-22 mins  $Therapeutic Activity: 8-22 mins                    Blondell Reveal Kistler PT 07/16/2020  Acute Rehabilitation Services Pager [phone] Office [phone]

## 2020-07-16 NOTE — Progress Notes (Addendum)
Physical Therapy Treatment Patient Details Name: Dean [name]. MRN: [id number] DOB: [birth year] Today's Date: 07/16/2020    History of Present Illness Patient is 74 y.o. male s/p Rt THA anterior approach on 07/15/20 with PMH significant for HTN, HLD, GERD, COPD, CAD, AAA, OA, A-fib, Asthma.    PT Comments    Upon start of PT session, noted pt's SaO2 was 88% on 2L at rest in bed. I requested a breathing treatment, which pt received prior to mobility. SaO2 was then 90% at rest on room air. With ambulation, SaO2 dropped to 83% on room air, and was 93% on 3L O2. Pt ambulated 130' with RW, completed stair training, and demonstrates good understanding of HEP. RN aware of hypoxia and notified MD. From a mobility standpoint, he is ready to DC home, but will need home O2 at present. Pt stated he has a pulse oximeter at home and that his levels are typically in the low to mid 90s, he does not use O2 at home. He stated he is a smoker.    Follow Up Recommendations  Follow surgeon's recommendation for DC plan and follow-up therapies     Equipment Recommendations  None recommended by PT    Recommendations for Other Services       Precautions / Restrictions Precautions Precautions: Fall Precaution Comments: monitor O2 Restrictions Weight Bearing Restrictions: No Other Position/Activity Restrictions: WBAT    Mobility  Bed Mobility Overal bed mobility: Modified Independent Bed Mobility: Supine to Sit     Supine to sit: Modified independent (Device/Increase time)     General bed mobility comments: used rails, HOB up  Transfers Overall transfer level: Needs assistance Equipment used: Rolling walker (2 wheeled) Transfers: Sit to/from Stand Sit to Stand: Supervision         General transfer comment: VCs hand placement  Ambulation/Gait Ambulation/Gait assistance: Supervision Gait Distance (Feet): 130 Feet Assistive device: Rolling walker (2 wheeled) Gait Pattern/deviations:  Step-to pattern;Decreased stride length;Decreased weight shift to right Gait velocity: decr   General Gait Details: SaO2 83% on room air walking, 92-93% on 3L O2 walking, no loss of balance, VCs sequencing   Stairs Stairs: Yes Stairs assistance: Min guard Stair Management: No rails;Step to pattern;Forwards;With walker Number of Stairs: 1 General stair comments: VCs sequencing, min/guard safety   Wheelchair Mobility    Modified Rankin (Stroke Patients Only)       Balance Overall balance assessment: Needs assistance Sitting-balance support: Feet supported Sitting balance-Leahy Scale: Good     Standing balance support: During functional activity;Bilateral upper extremity supported Standing balance-Leahy Scale: Fair                              Cognition Arousal/Alertness: Awake/alert Behavior During Therapy: WFL for tasks assessed/performed Overall Cognitive Status: Within Functional Limits for tasks assessed                                        Exercises Total Joint Exercises Ankle Circles/Pumps: AROM;Both;20 reps;Seated Quad Sets: AROM;Right;5 reps;Supine Short Arc Quad: AROM;Right;10 reps;Supine Heel Slides: AAROM;Right;10 reps;Supine Hip ABduction/ADduction: AAROM;Right;10 reps;Supine Long Arc Quad: AROM;Right;10 reps;Supine    General Comments        Pertinent Vitals/Pain Pain Score: 1  Pain Location: Rt hip Pain Descriptors / Indicators: Aching;Discomfort Pain Intervention(s): Limited activity within patient's tolerance;Monitored during session;Ice applied;Premedicated before session  Home Living                      Prior Function            PT Goals (current goals can now be found in the care plan section) Acute Rehab PT Goals Patient Stated Goal: be able to travel again PT Goal Formulation: With patient Time For Goal Achievement: 07/22/20 Potential to Achieve Goals: Good Progress towards PT goals:  Progressing toward goals    Frequency    7X/week      PT Plan Current plan remains appropriate    Co-evaluation              AM-PAC PT "6 Clicks" Mobility   Outcome Measure  Help needed turning from your back to your side while in a flat bed without using bedrails?: A Little Help needed moving from lying on your back to sitting on the side of a flat bed without using bedrails?: A Little Help needed moving to and from a bed to a chair (including a wheelchair)?: None Help needed standing up from a chair using your arms (e.g., wheelchair or bedside chair)?: None Help needed to walk in hospital room?: None Help needed climbing 3-5 steps with a railing? : A Little 6 Click Score: 21    End of Session Equipment Utilized During Treatment: Gait belt Activity Tolerance: Patient tolerated treatment well Patient left: in chair;with call bell/phone within reach;with chair alarm set Nurse Communication: Mobility status;Other (comment) (hypoxic with activity) PT Visit Diagnosis: Difficulty in walking, not elsewhere classified (R26.2);Muscle weakness (generalized) (M62.81)     Time: 6004-5997 PT Time Calculation (min) (ACUTE ONLY): 45 min  Charges:  $Gait Training: 8-22 mins $Therapeutic Exercise: 8-22 mins $Therapeutic Activity: 8-22 mins           Blondell Reveal Kistler PT 07/16/2020  Acute Rehabilitation Services Pager [phone] Office [phone]

## 2020-07-16 NOTE — Progress Notes (Signed)
Subjective: 1 Day Post-Op Procedure(s) (LRB): TOTAL HIP ARTHROPLASTY ANTERIOR APPROACH (Right) Patient reports pain as mild.   Patient seen in rounds with Dr. Wynelle Link. Patient is well, and has had no acute complaints or problems. States he is ready to go home. Denies chest pain or SOB. No issues overnight. Foley catheter to be removed this AM. We will continue therapy today, ambulated 40' yesterday.  Objective: Vital signs in last 24 hours: Temp:  [97.7 F (36.5 C)-98.5 F (36.9 C)] 98.4 F (36.9 C) (07/29 0537) Pulse Rate:  [43-63] 63 (07/29 0537) Resp:  [11-19] 17 (07/29 0537) BP: (103-138)/(48-71) 128/65 (07/29 0537) SpO2:  [91 %-100 %] 95 % (07/29 0537)  Intake/Output from previous day:  Intake/Output Summary (Last 24 hours) at 07/16/2020 0732 Last data filed at 07/16/2020 0615 Gross per 24 hour  Intake 4169.51 ml  Output 1200 ml  Net 2969.51 ml     Intake/Output this shift: No intake/output data recorded.  Labs: Recent Labs    07/13/20 1354 07/16/20 0237  HGB 14.3 11.3*   Recent Labs    07/13/20 1354 07/16/20 0237  WBC 10.3 17.0*  RBC 4.53 3.63*  HCT 43.7 35.5*  PLT 226 189   Recent Labs    07/13/20 1354 07/16/20 0237  NA 141 138  K 4.0 4.2  CL 101 102  CO2 32 28  BUN 17 18  CREATININE 1.32* 1.39*  GLUCOSE 92 153*  CALCIUM 9.8 8.8*   Recent Labs    07/13/20 1354  INR 1.1    Exam: General - Patient is Alert and Oriented Extremity - Neurologically intact Neurovascular intact Sensation intact distally Dorsiflexion/Plantar flexion intact Dressing - dressing C/D/I Motor Function - intact, moving foot and toes well on exam.   Past Medical History:  Diagnosis Date  . AAA (abdominal aortic aneurysm) (HCC)    infrarenal aaa 3.1 x 2.8 per abd/pelvis ct 02-26-20 epic  . Allergy    pollen  . Anginal pain ([place]) 2005  . Arthritis    hips, back, neck   . Asthma   . Atrial fibrillation ([place])    no documentation in epic/ pt states has never  been told has A Fib  . CAD (coronary artery disease)    s/p angioplasty and stenting x3 1994, pt states has this done  . Cataract    small, mild  . Chronic kidney disease    kidney cyst  . Complication of anesthesia    woke up during gall bladder sx yrs ago and with colonscopy  . Complication of anesthesia    pt thinks stopped breathing after gallbladder sx years ago, no record found  . COPD (chronic obstructive pulmonary disease) ([place])   . Dysrhythmia    regular irregular heart beat  . GERD (gastroesophageal reflux disease)    hx of  . History of hiatal hernia   . History of kidney stones   . History of prediabetes    none in last 8 months since 40 lb weight loss  . Hyperlipidemia   . Hypertension   . Lower extremity edema since 02-14-2020   swelling goes down when sleeps resolved 06-29-20  . Neuromuscular disorder ([place])    nerve issues with back   . Peripheral vascular disease ([place])   . PVC's (premature ventricular contractions)    pt denies  . Sleep apnea    wears cpap auto set 5 to 20  . Tuberculosis 1966    Assessment/Plan: 1 Day Post-Op Procedure(s) (LRB): TOTAL HIP  ARTHROPLASTY ANTERIOR APPROACH (Right) Principal Problem:   OA (osteoarthritis) of hip Active Problems:   S/P right THA  Estimated body mass index is 26.33 kg/m as calculated from the following:   Height as of this encounter: 5\' 4"  (1.626 m).   Weight as of this encounter: 69.6 kg. Advance diet Up with therapy D/C IV fluids  DVT Prophylaxis - Xarelto Weight bearing as tolerated. Continue therapy.  Plan is to go Home after hospital stay. Plan for discharge later today once cleared by PT with HEP. Follow-up in the office August 10th.  Theresa Duty, PA-C Orthopedic Surgery [phone] 07/16/2020, 7:32 AM

## 2020-07-16 NOTE — Progress Notes (Signed)
SATURATION QUALIFICATIONS: (This note is used to comply with regulatory documentation for home oxygen)  Patient Saturations on Room Air at Rest = 90%  Patient Saturations on Room Air while Ambulating = 83%  Patient Saturations on 3 Liters of oxygen while Ambulating = 93%  Please briefly explain why patient needs home oxygen: to maintain appropriate SaO2 levels with activity.    Blondell Reveal Kistler PT 07/16/2020  Acute Rehabilitation Services Pager [phone] Office [phone]

## 2020-07-17 DIAGNOSIS — I129 Hypertensive chronic kidney disease with stage 1 through stage 4 chronic kidney disease, or unspecified chronic kidney disease: Secondary | ICD-10-CM | POA: Diagnosis not present

## 2020-07-17 DIAGNOSIS — Z86010 Personal history of colonic polyps: Secondary | ICD-10-CM | POA: Diagnosis not present

## 2020-07-17 DIAGNOSIS — J449 Chronic obstructive pulmonary disease, unspecified: Secondary | ICD-10-CM | POA: Diagnosis not present

## 2020-07-17 DIAGNOSIS — Z7982 Long term (current) use of aspirin: Secondary | ICD-10-CM | POA: Diagnosis not present

## 2020-07-17 DIAGNOSIS — N189 Chronic kidney disease, unspecified: Secondary | ICD-10-CM | POA: Diagnosis not present

## 2020-07-17 DIAGNOSIS — F17210 Nicotine dependence, cigarettes, uncomplicated: Secondary | ICD-10-CM | POA: Diagnosis not present

## 2020-07-17 DIAGNOSIS — M1611 Unilateral primary osteoarthritis, right hip: Secondary | ICD-10-CM | POA: Diagnosis not present

## 2020-07-17 DIAGNOSIS — I2510 Atherosclerotic heart disease of native coronary artery without angina pectoris: Secondary | ICD-10-CM | POA: Diagnosis not present

## 2020-07-17 DIAGNOSIS — R7303 Prediabetes: Secondary | ICD-10-CM | POA: Diagnosis not present

## 2020-07-17 LAB — BASIC METABOLIC PANEL
Anion gap: 5 (ref 5–15)
BUN: 23 mg/dL (ref 8–23)
CO2: 28 mmol/L (ref 22–32)
Calcium: 8.9 mg/dL (ref 8.9–10.3)
Chloride: 102 mmol/L (ref 98–111)
Creatinine, Ser: 1.56 mg/dL — ABNORMAL HIGH (ref 0.61–1.24)
GFR calc Af Amer: 50 mL/min — ABNORMAL LOW (ref >60)
GFR calc non Af Amer: 43 mL/min — ABNORMAL LOW (ref >60)
Glucose, Bld: 138 mg/dL — ABNORMAL HIGH (ref 70–99)
Potassium: 4.3 mmol/L (ref 3.5–5.1)
Sodium: 135 mmol/L (ref 135–145)

## 2020-07-17 LAB — CBC
HCT: 35.9 % — ABNORMAL LOW (ref 39.0–52.0)
Hemoglobin: 11.3 g/dL — ABNORMAL LOW (ref 13.0–17.0)
MCH: 31 pg (ref 26.0–34.0)
MCHC: 31.5 g/dL (ref 30.0–36.0)
MCV: 98.6 fL (ref 80.0–100.0)
Platelets: 193 10*3/uL (ref 150–400)
RBC: 3.64 MIL/uL — ABNORMAL LOW (ref 4.22–5.81)
RDW: 14.7 % (ref 11.5–15.5)
WBC: 15.3 10*3/uL — ABNORMAL HIGH (ref 4.0–10.5)
nRBC: 0 % (ref 0.0–0.2)

## 2020-07-17 NOTE — Plan of Care (Signed)

## 2020-07-17 NOTE — Progress Notes (Signed)
Discharge instructions and medication reviewed.  Patient expressed understanding, no further questions at this time.  Patient to discharge home in private vehicle.

## 2020-07-17 NOTE — Progress Notes (Signed)
Subjective: 2 Days Post-Op Procedure(s) (LRB): TOTAL HIP ARTHROPLASTY ANTERIOR APPROACH (Right) Patient reports pain as mild.   Patient seen in rounds by Dr. Wynelle Link. Patient is well, and has had no acute complaints or problems. Had issues with oxygen saturation ranging below 90 yesterday when off O2. History of COPD. Pulse within normal range. CXR obtained which showed no acute changes.  Plan is to go Home after hospital stay.  Objective: Vital signs in last 24 hours: Temp:  [97.8 F (36.6 C)-98.4 F (36.9 C)] 98.3 F (36.8 C) (07/30 0459) Pulse Rate:  [45-74] 45 (07/30 0459) Resp:  [16-18] 17 (07/30 0459) BP: (114-153)/(62-70) 153/62 (07/30 0459) SpO2:  [83 %-98 %] 95 % (07/30 0459)  Intake/Output from previous day:  Intake/Output Summary (Last 24 hours) at 07/17/2020 0749 Last data filed at 07/16/2020 1800 Gross per 24 hour  Intake 600 ml  Output --  Net 600 ml    Intake/Output this shift: No intake/output data recorded.  Labs: Recent Labs    07/16/20 0237 07/17/20 0240  HGB 11.3* 11.3*   Recent Labs    07/16/20 0237 07/17/20 0240  WBC 17.0* 15.3*  RBC 3.63* 3.64*  HCT 35.5* 35.9*  PLT 189 193   Recent Labs    07/16/20 0237 07/17/20 0240  NA 138 135  K 4.2 4.3  CL 102 102  CO2 28 28  BUN 18 23  CREATININE 1.39* 1.56*  GLUCOSE 153* 138*  CALCIUM 8.8* 8.9   No results for input(s): LABPT, INR in the last 72 hours.  Exam: General - Patient is Alert and Oriented Extremity - Neurologically intact Neurovascular intact Sensation intact distally Dorsiflexion/Plantar flexion intact Dressing/Incision - clean, dry, no drainage Motor Function - intact, moving foot and toes well on exam.   Past Medical History:  Diagnosis Date  . AAA (abdominal aortic aneurysm) (HCC)    infrarenal aaa 3.1 x 2.8 per abd/pelvis ct 02-26-20 epic  . Allergy    pollen  . Anginal pain ([place]) 2005  . Arthritis    hips, back, neck   . Asthma   . Atrial fibrillation ([place])     no documentation in epic/ pt states has never been told has A Fib  . CAD (coronary artery disease)    s/p angioplasty and stenting x3 1994, pt states has this done  . Cataract    small, mild  . Chronic kidney disease    kidney cyst  . Complication of anesthesia    woke up during gall bladder sx yrs ago and with colonscopy  . Complication of anesthesia    pt thinks stopped breathing after gallbladder sx years ago, no record found  . COPD (chronic obstructive pulmonary disease) ([place])   . Dysrhythmia    regular irregular heart beat  . GERD (gastroesophageal reflux disease)    hx of  . History of hiatal hernia   . History of kidney stones   . History of prediabetes    none in last 8 months since 40 lb weight loss  . Hyperlipidemia   . Hypertension   . Lower extremity edema since 02-14-2020   swelling goes down when sleeps resolved 06-29-20  . Neuromuscular disorder ([place])    nerve issues with back   . Peripheral vascular disease ([place])   . PVC's (premature ventricular contractions)    pt denies  . Sleep apnea    wears cpap auto set 5 to 20  . Tuberculosis 1966    Assessment/Plan: 2 Days Post-Op  Procedure(s) (LRB): TOTAL HIP ARTHROPLASTY ANTERIOR APPROACH (Right) Principal Problem:   OA (osteoarthritis) of hip Active Problems:   S/P right THA  Estimated body mass index is 26.33 kg/m as calculated from the following:   Height as of this encounter: 5\' 4"  (1.626 m).   Weight as of this encounter: 69.6 kg. Up with therapy  DVT Prophylaxis - Xarelto Weight-bearing as tolerated  Plan for discharge later today if cleared by PT. If oxygen sats are not improved, we will consult the hospitalists to see him prior to leaving.   Theresa Duty, PA-C Orthopedic Surgery [phone] 07/17/2020, 7:49 AM

## 2020-07-17 NOTE — Plan of Care (Signed)
Pt ready for DC home 

## 2020-07-17 NOTE — Progress Notes (Signed)
Physical Therapy Treatment Patient Details Name: Dean [name]. MRN: [id number] DOB: Feb 28, 1946 Today's Date: 07/17/2020    History of Present Illness Patient is 74 y.o. male s/p Rt THA anterior approach on 07/15/20 with PMH significant for HTN, HLD, GERD, COPD, CAD, AAA, OA, A-fib, Asthma.    PT Comments    Improved activity tolerance today, pt ambulated 140' with RW, SaO2 87-89% on room air while walking. Pt verbalized understanding of HEP and stated he would perform it at home. Pt is ready to DC home from PT standpoint.   Follow Up Recommendations  Follow surgeon's recommendation for DC plan and follow-up therapies     Equipment Recommendations  None recommended by PT    Recommendations for Other Services       Precautions / Restrictions Precautions Precautions: Fall Precaution Comments: monitor O2 Restrictions Weight Bearing Restrictions: No Other Position/Activity Restrictions: WBAT    Mobility  Bed Mobility               General bed mobility comments: up on edge of bed  Transfers Overall transfer level: Modified independent Equipment used: Rolling walker (2 wheeled) Transfers: Sit to/from Stand Sit to Stand: Modified independent (Device/Increase time)         General transfer comment: good hand placement  Ambulation/Gait Ambulation/Gait assistance: Modified independent (Device/Increase time) Gait Distance (Feet): 140 Feet Assistive device: Rolling walker (2 wheeled) Gait Pattern/deviations: Decreased stride length;Step-through pattern Gait velocity: decr   General Gait Details: SaO2 87-88% on room air walking, 2/4 dyspnea   Stairs             Wheelchair Mobility    Modified Rankin (Stroke Patients Only)       Balance Overall balance assessment: Needs assistance Sitting-balance support: Feet supported Sitting balance-Leahy Scale: Good     Standing balance support: During functional activity;Bilateral upper extremity  supported Standing balance-Leahy Scale: Fair                              Cognition Arousal/Alertness: Awake/alert Behavior During Therapy: WFL for tasks assessed/performed Overall Cognitive Status: Within Functional Limits for tasks assessed                                        Exercises      General Comments        Pertinent Vitals/Pain Pain Score: 1  Pain Location: Rt hip Pain Descriptors / Indicators: Aching;Discomfort Pain Intervention(s): Limited activity within patient's tolerance;Monitored during session;Premedicated before session;Ice applied    Home Living                      Prior Function            PT Goals (current goals can now be found in the care plan section) Acute Rehab PT Goals Patient Stated Goal: be able to travel again PT Goal Formulation: With patient Time For Goal Achievement: 07/22/20 Potential to Achieve Goals: Good Progress towards PT goals: Goals met/education completed, patient discharged from PT    Frequency    7X/week      PT Plan Current plan remains appropriate    Co-evaluation              AM-PAC PT "6 Clicks" Mobility   Outcome Measure  Help needed turning from your back to your side while  in a flat bed without using bedrails?: None Help needed moving from lying on your back to sitting on the side of a flat bed without using bedrails?: None Help needed moving to and from a bed to a chair (including a wheelchair)?: None Help needed standing up from a chair using your arms (e.g., wheelchair or bedside chair)?: None Help needed to walk in hospital room?: None Help needed climbing 3-5 steps with a railing? : None 6 Click Score: 24    End of Session Equipment Utilized During Treatment: Gait belt Activity Tolerance: Patient tolerated treatment well Patient left: with call bell/phone within reach;in bed Nurse Communication: Mobility status;Other (comment) (walking O2 saturation  levels) PT Visit Diagnosis: Difficulty in walking, not elsewhere classified (R26.2);Muscle weakness (generalized) (M62.81)     Time: 7564-3329 PT Time Calculation (min) (ACUTE ONLY): 27 min  Charges:  $Gait Training: 8-22 mins $Therapeutic Activity: 8-22 mins                    Blondell Reveal Kistler PT 07/17/2020  Acute Rehabilitation Services Pager [phone] Office [phone]

## 2020-07-18 NOTE — Progress Notes (Signed)
Subjective:  Patient ID: Dean Filter., male    DOB: [birth year],  MRN: [id number]  Dean [name]. presents to clinic today for painful thick toenails that are difficult to trim. Pain interferes with ambulation. Aggravating factors include wearing enclosed shoe gear. Pain is relieved with periodic professional debridement..  74 y.o. male presents with the above complaint.  He states he is having hip surgery on the right side on tomorrow.  Review of Systems: Negative except as noted in the HPI. Past Medical History:  Diagnosis Date  . AAA (abdominal aortic aneurysm) (HCC)    infrarenal aaa 3.1 x 2.8 per abd/pelvis ct 02-26-20 epic  . Allergy    pollen  . Anginal pain ([place]) 2005  . Arthritis    hips, back, neck   . Asthma   . Atrial fibrillation ([place])    no documentation in epic/ pt states has never been told has A Fib  . CAD (coronary artery disease)    s/p angioplasty and stenting x3 1994, pt states has this done  . Cataract    small, mild  . Chronic kidney disease    kidney cyst  . Complication of anesthesia    woke up during gall bladder sx yrs ago and with colonscopy  . Complication of anesthesia    pt thinks stopped breathing after gallbladder sx years ago, no record found  . COPD (chronic obstructive pulmonary disease) ([place])   . Dysrhythmia    regular irregular heart beat  . GERD (gastroesophageal reflux disease)    hx of  . History of hiatal hernia   . History of kidney stones   . History of prediabetes    none in last 8 months since 40 lb weight loss  . Hyperlipidemia   . Hypertension   . Lower extremity edema since 02-14-2020   swelling goes down when sleeps resolved 06-29-20  . Neuromuscular disorder ([place])    nerve issues with back   . Peripheral vascular disease ([place])   . PVC's (premature ventricular contractions)    pt denies  . Sleep apnea    wears cpap auto set 5 to 20  . Tuberculosis 1966   Past Surgical History:  Procedure Laterality  Date  . APPENDECTOMY    . CHOLECYSTECTOMY  1990s   Gall Bladder  . COLONOSCOPY    . CORONARY ANGIOPLASTY WITH STENT PLACEMENT  1994  . CYSTOSCOPY WITH INSERTION OF UROLIFT N/A 04/14/2020   Procedure: CYSTOSCOPY WITH INSERTION OF UROLIFT;  Surgeon: Festus Aloe, MD;  [facility];  Service: Urology;  Laterality: N/A;  . CYSTOSCOPY WITH STENT PLACEMENT Right 07/07/2020   Procedure: CYSTOSCOPY WITH STENT CHANGE;  Surgeon: Festus Aloe, MD;  Location[facility];  Service: Urology;  Laterality: Right;  . CYSTOSCOPY WITH URETEROSCOPY AND STENT PLACEMENT Bilateral 01/03/2020   Procedure: CYSTOSCOPY WITH RETROGRADE/ URETEROSCOPY AND RIGHT STENT PLACEMENT BLADDER BIOPSY;  Surgeon: Festus Aloe, MD;  Location[facility];  Service: Urology;  Laterality: Bilateral;  ONLY NEEDS 30 MIN  . CYSTOSCOPY WITH URETEROSCOPY AND STENT PLACEMENT Right 04/14/2020   Procedure: CYSTOSCOPY WITH URETEROSCOPY, RETROGRADE  AND STENT REPLACEMENT;  Surgeon: Festus Aloe, MD;  Location: [facility];  Service: Urology;  Laterality: Right;  . EYE SURGERY Bilateral 2019   cataracts  . lumbar disectomy  2005   L3 - 4 - 5  . POLYPECTOMY    . PR VEIN BYPASS GRAFT,AORTO-FEM-POP  08/31/11   Right to Left  Fem-Fem  . RENAL ANGIOGRAPHY N/A 02/14/2020   Procedure: RENAL ANGIOGRAPHY;  Surgeon: Elam Dutch, MD;  Location: [place] [facility];  Service: Cardiovascular;  Laterality: N/A;  Bilateral  . TONSILLECTOMY  as child  . TONSILLECTOMY    . TOTAL HIP ARTHROPLASTY Right 07/15/2020   Procedure: TOTAL HIP ARTHROPLASTY ANTERIOR APPROACH;  Surgeon: Gaynelle Arabian, MD;  Location: [facility];  Service: Orthopedics;  Laterality: Right;  159min  . UMBILICAL HERNIA REPAIR     x 2    Current Outpatient Medications:  .  albuterol (PROVENTIL HFA) 108 (90 Base) MCG/ACT inhaler, Inhale 1 puff into the lungs 2 (two) times daily., Disp: 8 g, Rfl: 5 .   albuterol (PROVENTIL) (2.5 MG/3ML) 0.083% nebulizer solution, USE 1 VIAL VIA NEBULIZER EVERY 6 HOURS AS NEEDED FOR WHEEZING OR SHORTNESS OF BREATH (Patient taking differently: Take 2.5 mg by nebulization every 6 (six) hours as needed for wheezing. ), Disp: 225 mL, Rfl: 12 .  antiseptic oral rinse (BIOTENE) LIQD, 15 mLs by Mouth Rinse route as needed for dry mouth., Disp: , Rfl:  .  atorvastatin (LIPITOR) 10 MG tablet, Take 10 mg by mouth every evening. , Disp: , Rfl:  .  Azelastine-Fluticasone 137-50 MCG/ACT SUSP, PLACE 1-2 SPRAYS INTO EACH NOSTRIL AT BEDTIME (Patient taking differently: Place 1-2 sprays into both nostrils 2 (two) times daily as needed (congestion.). ), Disp: 23 g, Rfl: 3 .  Budeson-Glycopyrrol-Formoterol (BREZTRI AEROSPHERE) 160-9-4.8 MCG/ACT AERO, Inhale 2 puffs into the lungs in the morning and at bedtime. (Patient not taking: Reported on 06/24/2020), Disp: 2 g, Rfl: 0 .  diltiazem (TIAZAC) 240 MG 24 hr capsule, Take 240 mg by mouth daily. , Disp: , Rfl:  .  docusate sodium (COLACE) 100 MG capsule, Take 100 mg by mouth daily as needed for mild constipation., Disp: , Rfl:  .  esomeprazole (NEXIUM) 40 MG capsule, Take 40 mg by mouth 2 (two) times daily as needed (acid reflux/indigestion.). , Disp: , Rfl:  .  finasteride (PROSCAR) 5 MG tablet, Take 5 mg by mouth at bedtime. , Disp: , Rfl:  .  furosemide (LASIX) 40 MG tablet, furosemide 40 mg tablet  TAKE 1 TABLET BY MOUTH EVERY DAY, Disp: , Rfl:  .  HYDROcodone-acetaminophen (NORCO/VICODIN) 5-325 MG tablet, Take 1-2 tablets by mouth every 6 (six) hours as needed for severe pain., Disp: 56 tablet, Rfl: 0 .  hydroxypropyl methylcellulose / hypromellose (ISOPTO TEARS / GONIOVISC) 2.5 % ophthalmic solution, Place 1 drop into both eyes 3 (three) times daily as needed for dry eyes., Disp: , Rfl:  .  irbesartan (AVAPRO) 150 MG tablet, Take 150 mg by mouth every evening. , Disp: , Rfl:  .  isosorbide mononitrate (IMDUR) 60 MG 24 hr tablet, Take  60 mg by mouth every evening. , Disp: , Rfl:  .  loperamide (IMODIUM A-D) 2 MG tablet, Take 2 mg by mouth 4 (four) times daily as needed for diarrhea or loose stools., Disp: , Rfl:  .  Menthol, Topical Analgesic, (BLUE-EMU MAXIMUM STRENGTH EX), Apply 1 application topically 3 (three) times daily as needed (pain.)., Disp: , Rfl:  .  methocarbamol (ROBAXIN) 500 MG tablet, Take 1 tablet (500 mg total) by mouth every 6 (six) hours as needed for muscle spasms., Disp: 40 tablet, Rfl: 0 .  nitrofurantoin (MACRODANTIN) 50 MG capsule, Take 1 capsule (50 mg total) by mouth at bedtime., Disp: 30 capsule, Rfl: 0 .  potassium chloride SA (KLOR-CON) 20 MEQ tablet, potassium chloride ER  20 mEq tablet,extended release(part/cryst)  TAKE 1 TABLET BY MOUTH DAILY, Disp: , Rfl:  .  pseudoephedrine-acetaminophen (TYLENOL SINUS) 30-500 MG TABS tablet, Take 1 tablet by mouth every 4 (four) hours as needed (sinus congestion). (Patient not taking: Reported on 06/24/2020), Disp: , Rfl:  .  rivaroxaban (XARELTO) 10 MG TABS tablet, Take 1 tablet (10 mg total) by mouth daily with breakfast for 20 days. Then resume one 325 mg Aspirin once a day., Disp: 20 tablet, Rfl: 0 .  simethicone (MYLICON) 696 MG chewable tablet, Chew 125 mg by mouth every 6 (six) hours as needed for flatulence. Takes gas x, Disp: , Rfl:  .  tamsulosin (FLOMAX) 0.4 MG CAPS capsule, Take 0.4 mg by mouth at bedtime. , Disp: , Rfl:  .  traMADol (ULTRAM) 50 MG tablet, Take 50 mg by mouth every 6 (six) hours as needed for pain., Disp: , Rfl:  Allergies  Allergen Reactions  . Penicillins     Childhood reaction  REACTION: rapid pulse Did it involve swelling of the face/tongue/throat, SOB, or low BP? Yes Did it involve sudden or severe rash/hives, skin peeling, or any reaction on the inside of your mouth or nose? Unknown Did you need to seek medical attention at a hospital or doctor's office? yes When did it last happen?childhood  If all above answers are  "NO", may proceed with cephalosporin use.   . Other     Oily seafood salmon-- chest pain  . Mold Extract [Trichophyton] Rash   Social History   Occupational History  . 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 Smoking status: Current Every Day Smoker    Packs/day: 2.00    Years: 51.00    Pack years: 102.00    Types: Cigarettes  . Smokeless tobacco: Never Used  . Tobacco comment: smoking a little more than a pack a day d/t stress and pain  Vaping Use  . Vaping Use: Former  Substance and Sexual Activity  . Alcohol use: Not Currently    Alcohol/week: 0.0 standard drinks  . Drug use: No  . Sexual activity: Not on file    Objective:   Constitutional Dean [name]. is a pleasant 74 y.o. Caucasian male, WD, WN in NAD.Marland Kitchen AAO x 3.   Vascular Dorsalis pedis pulses palpable bilaterally. Posterior tibial pulses palpable bilaterally. Capillary refill normal to all digits.  No cyanosis or clubbing noted. Pedal hair growth normal b/l. Lower extremity skin temperature gradient within normal limits.  Neurologic Normal speech. Oriented to person, place, and time. Epicritic sensation to light touch grossly present bilaterally. Protective sensation intact 5/5 intact bilaterally with 10g monofilament b/l. Vibratory sensation intact b/l.  Dermatologic Pedal skin with normal turgor, texture and tone b/l.  Toenails are discolored, thick, elongated, dystrophic with pain on palpation x 10 No open wounds. No skin lesions. Pedal skin with normal turgor, texture and tone bilaterally. No open wounds bilaterally. No interdigital macerations bilaterally. Toenails 1-5 b/l elongated, discolored, dystrophic, thickened, crumbly with subungual debris and tenderness to dorsal palpation. There is noted onchyolysis of entire nailplate of L hallux and R hallux.  The nailbeds remain intact. There is no erythema, no edema, no drainage, no underlying flocculence.  Orthopedic:  Normal muscle strength 5/5 to all lower extremity muscle groups bilaterally. No pain crepitus or joint limitation noted with ROM b/l. Hallux valgus with bunion deformity noted b/l lower extremities. No bony tenderness.    Radiographs: None Assessment:  No diagnosis  found. Plan:  Patient was evaluated and treated and all questions answered.  Onychomycosis with pain -Nails palliatively debridement as below -Educated on self-care  Procedure: Nail Debridement Rationale: Pain Type of Debridement: manual, sharp debridement. Instrumentation: Nail nipper, rotary burr. Number of Nails: 10 -Examined patient. -Toenails 1-5 b/l were debrided in length and girth with sterile nail nippers and dremel without iatrogenic bleeding. Bilateral hallux nailplates gently debrided from the remaining attachments to digits. Nailbeds cleansed with alcohol. Triple antibiotic ointment applied. No further treatment required. -Patient to report any pedal injuries to medical professional immediately. -Patient to continue soft, supportive shoe gear daily. -Patient/POA to call should there be question/concern in the interim.  Return in about 3 months (around 10/14/2020) for nail and callus trim.  [name], DPM

## 2020-07-21 NOTE — Discharge Summary (Signed)
Physician Discharge Summary   Patient ID: [place] [name]. MRN: [id number] DOB/AGE: 1946/06/26 74 y.o.  Admit date: 07/15/2020 Discharge date: 07/17/2020  Primary Diagnosis: Osteoarthritis, right hip   Admission Diagnoses:  Past Medical History:  Diagnosis Date  . AAA (abdominal aortic aneurysm) (HCC)    infrarenal aaa 3.1 x 2.8 per abd/pelvis ct 02-26-20 epic  . Allergy    pollen  . Anginal pain ([place]) 2005  . Arthritis    hips, back, neck   . Asthma   . Atrial fibrillation ([place])    no documentation in epic/ pt states has never been told has A Fib  . CAD (coronary artery disease)    s/p angioplasty and stenting x3 1994, pt states has this done  . Cataract    small, mild  . Chronic kidney disease    kidney cyst  . Complication of anesthesia    woke up during gall bladder sx yrs ago and with colonscopy  . Complication of anesthesia    pt thinks stopped breathing after gallbladder sx years ago, no record found  . COPD (chronic obstructive pulmonary disease) ([place])   . Dysrhythmia    regular irregular heart beat  . GERD (gastroesophageal reflux disease)    hx of  . History of hiatal hernia   . History of kidney stones   . History of prediabetes    none in last 8 months since 40 lb weight loss  . Hyperlipidemia   . Hypertension   . Lower extremity edema since 02-14-2020   swelling goes down when sleeps resolved 06-29-20  . Neuromuscular disorder ([place])    nerve issues with back   . Peripheral vascular disease ([place])   . PVC's (premature ventricular contractions)    pt denies  . Sleep apnea    wears cpap auto set 5 to 20  . Tuberculosis 1966   Discharge Diagnoses:   Principal Problem:   OA (osteoarthritis) of hip Active Problems:   S/P right THA  Estimated body mass index is 26.33 kg/m as calculated from the following:   Height as of this encounter: 5\' 4"  (1.626 m).   Weight as of this encounter: 69.6 kg.  Procedure:  Procedure(s) (LRB): TOTAL HIP  ARTHROPLASTY ANTERIOR APPROACH (Right)   Consults: None  HPI: [place] [name]. is a 74 y.o. male who has advanced end-  stage arthritis of their Right  hip with progressively worsening pain and  dysfunction.The patient has failed nonoperative management and presents for total hip arthroplasty.   Laboratory Data: Admission on 07/15/2020, Discharged on 07/17/2020  Component Date Value Ref Range Status  . WBC 07/16/2020 17.0* 4.0 - 10.5 K/uL Final  . RBC 07/16/2020 3.63* 4.22 - 5.81 MIL/uL Final  . Hemoglobin 07/16/2020 11.3* 13.0 - 17.0 g/dL Final  . HCT 07/16/2020 35.5* 39 - 52 % Final  . MCV 07/16/2020 97.8  80.0 - 100.0 fL Final  . MCH 07/16/2020 31.1  26.0 - 34.0 pg Final  . MCHC 07/16/2020 31.8  30.0 - 36.0 g/dL Final  . RDW 07/16/2020 14.4  11.5 - 15.5 % Final  . Platelets 07/16/2020 189  150 - 400 K/uL Final  . nRBC 07/16/2020 0.0  0.0 - 0.2 % Final   Performed at [facility], [place] [address] [place], [place] [address]  . Sodium 07/16/2020 138  135 - 145 mmol/L Final  . Potassium 07/16/2020 4.2  3.5 - 5.1 mmol/L Final  . Chloride 07/16/2020 102  98 -  111 mmol/L Final  . CO2 07/16/2020 28  22 - 32 mmol/L Final  . Glucose, Bld 07/16/2020 153* 70 - 99 mg/dL Final   Glucose reference range applies only to samples taken after fasting for at least 8 hours.  . BUN 07/16/2020 18  8 - 23 mg/dL Final  . Creatinine, Ser 07/16/2020 1.39* 0.61 - 1.24 mg/dL Final  . Calcium 07/16/2020 8.8* 8.9 - 10.3 mg/dL Final  . GFR calc non Af Amer 07/16/2020 50* >60 mL/min Final  . GFR calc Af Amer 07/16/2020 58* >60 mL/min Final  . Anion gap 07/16/2020 8  5 - 15 Final   Performed at [facility], [place] [address] [place], [place] [address]  . WBC 07/17/2020 15.3* 4.0 - 10.5 K/uL Final  . RBC 07/17/2020 3.64* 4.22 - 5.81 MIL/uL Final  . Hemoglobin 07/17/2020 11.3* 13.0 - 17.0 g/dL Final  . HCT 07/17/2020 35.9* 39 - 52 % Final  . MCV 07/17/2020 98.6  80.0 -  100.0 fL Final  . MCH 07/17/2020 31.0  26.0 - 34.0 pg Final  . MCHC 07/17/2020 31.5  30.0 - 36.0 g/dL Final  . RDW 07/17/2020 14.7  11.5 - 15.5 % Final  . Platelets 07/17/2020 193  150 - 400 K/uL Final  . nRBC 07/17/2020 0.0  0.0 - 0.2 % Final   Performed at [facility], [place] [address], [place], [place] [address]  . Sodium 07/17/2020 135  135 - 145 mmol/L Final  . Potassium 07/17/2020 4.3  3.5 - 5.1 mmol/L Final  . Chloride 07/17/2020 102  98 - 111 mmol/L Final  . CO2 07/17/2020 28  22 - 32 mmol/L Final  . Glucose, Bld 07/17/2020 138* 70 - 99 mg/dL Final   Glucose reference range applies only to samples taken after fasting for at least 8 hours.  . BUN 07/17/2020 23  8 - 23 mg/dL Final  . Creatinine, Ser 07/17/2020 1.56* 0.61 - 1.24 mg/dL Final  . Calcium 07/17/2020 8.9  8.9 - 10.3 mg/dL Final  . GFR calc non Af Amer 07/17/2020 43* >60 mL/min Final  . GFR calc Af Amer 07/17/2020 50* >60 mL/min Final  . Anion gap 07/17/2020 5  5 - 15 Final   Performed at [facility], [place] [address] [place], [place] [address]  Hospital Outpatient Visit on 07/13/2020  Component Date Value Ref Range Status  . aPTT 07/13/2020 27  24 - 36 seconds Final   Performed at [facility], [place] [address] [place], [place] [address]  . WBC 07/13/2020 10.3  4.0 - 10.5 K/uL Final  . RBC 07/13/2020 4.53  4.22 - 5.81 MIL/uL Final  . Hemoglobin 07/13/2020 14.3  13.0 - 17.0 g/dL Final  . HCT 07/13/2020 43.7  39 - 52 % Final  . MCV 07/13/2020 96.5  80.0 - 100.0 fL Final  . MCH 07/13/2020 31.6  26.0 - 34.0 pg Final  . MCHC 07/13/2020 32.7  30.0 - 36.0 g/dL Final  . RDW 07/13/2020 14.4  11.5 - 15.5 % Final  . Platelets 07/13/2020 226  150 - 400 K/uL Final  . nRBC 07/13/2020 0.0  0.0 - 0.2 % Final   Performed at [facility], [place] [address] [place], [place] [address]  . Sodium 07/13/2020 141  135 - 145 mmol/L Final  . Potassium 07/13/2020 4.0   3.5 - 5.1 mmol/L Final  . Chloride 07/13/2020 101  98 - 111 mmol/L Final  . CO2  07/13/2020 32  22 - 32 mmol/L Final  . Glucose, Bld 07/13/2020 92  70 - 99 mg/dL Final   Glucose reference range applies only to samples taken after fasting for at least 8 hours.  . BUN 07/13/2020 17  8 - 23 mg/dL Final  . Creatinine, Ser 07/13/2020 1.32* 0.61 - 1.24 mg/dL Final  . Calcium 07/13/2020 9.8  8.9 - 10.3 mg/dL Final  . Total Protein 07/13/2020 7.2  6.5 - 8.1 g/dL Final  . Albumin 07/13/2020 3.8  3.5 - 5.0 g/dL Final  . AST 07/13/2020 15  15 - 41 U/L Final  . ALT 07/13/2020 12  0 - 44 U/L Final  . Alkaline Phosphatase 07/13/2020 63  38 - 126 U/L Final  . Total Bilirubin 07/13/2020 0.6  0.3 - 1.2 mg/dL Final  . GFR calc non Af Amer 07/13/2020 53* >60 mL/min Final  . GFR calc Af Amer 07/13/2020 >60  >60 mL/min Final  . Anion gap 07/13/2020 8  5 - 15 Final   Performed at [facility], [place] [address], [place], [place] [address]  . Prothrombin Time 07/13/2020 13.4  11.4 - 15.2 seconds Final  . INR 07/13/2020 1.1  0.8 - 1.2 Final   Comment: (NOTE) INR goal varies based on device and disease states. Performed at [facility], [place] [address] [place], [place] [address]   . ABO/RH(D) 07/13/2020 A POS   Final  . Antibody Screen 07/13/2020 NEG   Final  . Sample Expiration 07/13/2020 07/18/2020,2359   Final  . Extend sample reason 07/13/2020    Final                   Value:NO TRANSFUSIONS OR PREGNANCY IN THE PAST 3 MONTHS Performed at [place] [address] [place], [place] [address]   . MRSA, PCR 07/13/2020 NEGATIVE  NEGATIVE Final  . Staphylococcus aureus 07/13/2020 NEGATIVE  NEGATIVE Final   Comment: (NOTE) The Xpert SA Assay (FDA approved for NASAL specimens in patients 12 years of age and older), is one component of a comprehensive surveillance program. It is not intended to diagnose infection nor to guide or monitor  treatment. Performed at [facility] [place] [facility], [place] [address], [place], [place] [address]   Hospital Outpatient Visit on 07/11/2020  Component Date Value Ref Range Status  . SARS Coronavirus 2 07/11/2020 NEGATIVE  NEGATIVE Final   Comment: (NOTE) SARS-CoV-2 target nucleic acids are NOT DETECTED.  The SARS-CoV-2 RNA is generally detectable in upper and lower respiratory specimens during the acute phase of infection. Negative results do not preclude SARS-CoV-2 infection, do not rule out co-infections with other pathogens, and should not be used as the sole basis for treatment or other patient management decisions. Negative results must be combined with clinical observations, patient history, and epidemiological information. The expected result is Negative.  Fact Sheet for Patients: SugarRoll.be  Fact Sheet for Healthcare Providers: https://www.woods-mathews.com/  This test is not yet approved or cleared by the [place] FDA and  has been authorized for detection and/or diagnosis of SARS-CoV-2 by FDA under an Emergency Use Authorization (EUA). This EUA will remain  in effect (meaning this test can be used) for the duration of the COVID-19 declaration under Se                          ction 564(b)(1) of the Act, 21 U.S.C. section 360bbb-3(b)(1), unless the authorization is terminated  or revoked sooner.  Performed at [facility], [place] [address], [place], [place] [address]   Admission on 07/07/2020, Discharged on 07/07/2020  Component Date Value Ref Range Status  . Sodium 07/07/2020 143  135 - 145 mmol/L Final  . Potassium 07/07/2020 3.4* 3.5 - 5.1 mmol/L Final  . Chloride 07/07/2020 101  98 - 111 mmol/L Final  . BUN 07/07/2020 15  8 - 23 mg/dL Final  . Creatinine, Ser 07/07/2020 1.60* 0.61 - 1.24 mg/dL Final  . Glucose, Bld 07/07/2020 76  70 - 99 mg/dL Final   Glucose reference range applies only to samples taken  after fasting for at least 8 hours.  . Calcium, Ion 07/07/2020 1.21  1.15 - 1.40 mmol/L Final  . TCO2 07/07/2020 28  22 - 32 mmol/L Final  . Hemoglobin 07/07/2020 13.9  13.0 - 17.0 g/dL Final  . HCT 07/07/2020 41.0  39 - 52 % Final  Hospital Outpatient Visit on 07/04/2020  Component Date Value Ref Range Status  . SARS Coronavirus 2 07/04/2020 NEGATIVE  NEGATIVE Final   Comment: (NOTE) SARS-CoV-2 target nucleic acids are NOT DETECTED.  The SARS-CoV-2 RNA is generally detectable in upper and lower respiratory specimens during the acute phase of infection. Negative results do not preclude SARS-CoV-2 infection, do not rule out co-infections with other pathogens, and should not be used as the sole basis for treatment or other patient management decisions. Negative results must be combined with clinical observations, patient history, and epidemiological information. The expected result is Negative.  Fact Sheet for Patients: SugarRoll.be  Fact Sheet for Healthcare Providers: https://www.woods-mathews.com/  This test is not yet approved or cleared by the [place] FDA and  has been authorized for detection and/or diagnosis of SARS-CoV-2 by FDA under an Emergency Use Authorization (EUA). This EUA will remain  in effect (meaning this test can be used) for the duration of the COVID-19 declaration under Se                          ction 564(b)(1) of the Act, 21 U.S.C. section 360bbb-3(b)(1), unless the authorization is terminated or revoked sooner.  Performed at [facility], [place] [address] [place], [place] [address]      X-Rays:DG Pelvis Portable  Result Date: 07/15/2020 CLINICAL DATA:  RIGHT-sided hip replacement postop evaluation EXAM: PORTABLE PELVIS 1-2 VIEWS COMPARISON:  Fluoroscopic images on July 15, 2020 FINDINGS: RIGHT ureteral stent remains in place. Signs of prior herniorrhaphy project over the low lumbar spine. Evidence of  prior bilateral iliac arterial stenting. RIGHT hip arthroplasty without signs of complicating features in this immediate postoperative setting in the AP projection. Soft tissue gas relates to recent surgical manipulation overlying the RIGHT hip. Post prostatectomy and lymphadenectomy. IMPRESSION: RIGHT hip arthroplasty without evidence of hardware complication in this immediate postoperative setting. Electronically Signed   By: Zetta Bills M.D.   On: 07/15/2020 09:43   DG CHEST PORT 1 VIEW  Result Date: 07/16/2020 CLINICAL DATA:  Status post right hip replacement EXAM: PORTABLE CHEST 1 VIEW COMPARISON:  05/13/2020 FINDINGS: Heart is normal size. Increased markings in the lung bases, likely scarring. Mild hyperinflation/COPD. No effusions or acute bony abnormality. IMPRESSION: COPD/chronic changes.  No active disease. Electronically Signed   By: Rolm Baptise M.D.   On: 07/16/2020 12:29   DG C-Arm 1-60 Min-No Report  Result Date: 07/15/2020 Fluoroscopy was utilized by the requesting physician.  No radiographic interpretation.   DG  HIP OPERATIVE UNILAT W OR W/O PELVIS RIGHT  Result Date: 07/15/2020 CLINICAL DATA:  Right hip arthroplasty EXAM: OPERATIVE right HIP (WITH PELVIS IF PERFORMED) 2 VIEWS TECHNIQUE: Fluoroscopic spot image(s) were submitted for interpretation post-operatively. Radiation exposure index: 1.1371 Gy. COMPARISON:  None. FINDINGS: Two intraoperative fluoroscopic images of the right hip demonstrate the right acetabular and femoral components to be well situated. IMPRESSION: Status post right total hip arthroplasty. Electronically Signed   By: Marijo Conception M.D.   On: 07/15/2020 08:42    EKG: Orders placed or performed during the hospital encounter of 04/14/20  . EKG     Hospital Course: Makoa Satz. is a 74 y.o. who was admitted to [facility]. They were brought to the operating room on 07/15/2020 and underwent Procedure(s): [place].  Patient tolerated the procedure well and was later transferred to the recovery room and then to the orthopaedic floor for postoperative care. They were given PO and IV analgesics for pain control following their surgery. They were given 24 hours of postoperative antibiotics of  Anti-infectives (From admission, onward)   Start     Dose/Rate Route Frequency Ordered Stop   07/15/20 2000  vancomycin (VANCOCIN) IVPB 1000 mg/200 mL premix        1,000 mg 200 mL/hr over 60 Minutes Intravenous Every 12 hours 07/15/20 1345 07/15/20 2115   07/15/20 0615  vancomycin (VANCOCIN) IVPB 1000 mg/200 mL premix        1,000 mg 200 mL/hr over 60 Minutes Intravenous On call to O.R. 07/15/20 7169 07/15/20 0743     and started on DVT prophylaxis in the form of Xarelto.   PT and OT were ordered for total joint protocol. Discharge planning consulted to help with postop disposition and equipment needs. Patient had a good night on the evening of surgery. They started to get up OOB with therapy on POD #0. Had issues with oxygen saturation ranging below 90 when off O2. CXR obtained which showed no acute changes, history of COPD. Pulse within normal range. Continued to work with therapy into POD #2. Pt was seen during rounds on day two and was ready to go home pending progress with therapy. Dressing was changed and the incision was clean, dry, and intact. Pt worked with therapy for one additional session and was meeting their goals. He was discharged to home later that day in stable condition.  Diet: Cardiac diet Activity: WBAT Follow-up: in 2 weeks Disposition: Home with HEP Discharged Condition: stable   Discharge Instructions    Call MD / Call 911   Complete by: As directed    If you experience chest pain or shortness of breath, CALL 911 and be transported to the hospital emergency room.  If you develope a fever above 101 F, pus (white drainage) or increased drainage or redness at the wound, or calf pain, call  your surgeon's office.   Change dressing   Complete by: As directed    You have an adhesive waterproof bandage over the incision. Leave this in place until your first follow-up appointment. Once you remove this you will not need to place another bandage.   Constipation Prevention   Complete by: As directed    Drink plenty of fluids.  Prune juice may be helpful.  You may use a stool softener, such as Colace (over the counter) 100 mg twice a day.  Use MiraLax (over the counter) for constipation as needed.   Diet -  low sodium heart healthy   Complete by: As directed    Do not sit on low chairs, stoools or toilet seats, as it may be difficult to get up from low surfaces   Complete by: As directed    Driving restrictions   Complete by: As directed    No driving for two weeks   TED hose   Complete by: As directed    Use stockings (TED hose) for three weeks on both leg(s).  You may remove them at night for sleeping.   Weight bearing as tolerated   Complete by: As directed      Allergies as of 07/17/2020      Reactions   Penicillins    Childhood reaction REACTION: rapid pulse Did it involve swelling of the face/tongue/throat, SOB, or low BP? Yes Did it involve sudden or severe rash/hives, skin peeling, or any reaction on the inside of your mouth or nose? Unknown Did you need to seek medical attention at a hospital or doctor's office? yes When did it last happen?childhood  If all above answers are "NO", may proceed with cephalosporin use.   Other    Oily seafood salmon-- chest pain   Mold Extract [trichophyton] Rash      Medication List    STOP taking these medications   aspirin 325 MG tablet   Magnesium 400 MG Tabs   magnesium oxide 400 MG tablet Commonly known as: MAG-OX   meloxicam 15 MG tablet Commonly known as: MOBIC   Vitamin D (Ergocalciferol) 1.25 MG (50000 UNIT) Caps capsule Commonly known as: DRISDOL     TAKE these medications   albuterol (2.5 MG/3ML)  0.083% nebulizer solution Commonly known as: PROVENTIL USE 1 VIAL VIA NEBULIZER EVERY 6 HOURS AS NEEDED FOR WHEEZING OR SHORTNESS OF BREATH What changed:   how much to take  how to take this  when to take this  reasons to take this  additional instructions   albuterol 108 (90 Base) MCG/ACT inhaler Commonly known as: Proventil HFA Inhale 1 puff into the lungs 2 (two) times daily. What changed: Another medication with the same name was changed. Make sure you understand how and when to take each.   antiseptic oral rinse Liqd 15 mLs by Mouth Rinse route as needed for dry mouth.   atorvastatin 10 MG tablet Commonly known as: LIPITOR Take 10 mg by mouth every evening.   Azelastine-Fluticasone 137-50 MCG/ACT Susp PLACE 1-2 SPRAYS INTO EACH NOSTRIL AT BEDTIME What changed: See the new instructions.   BLUE-EMU MAXIMUM STRENGTH EX Apply 1 application topically 3 (three) times daily as needed (pain.).   Breztri Aerosphere 160-9-4.8 MCG/ACT Aero Generic drug: Budeson-Glycopyrrol-Formoterol Inhale 2 puffs into the lungs in the morning and at bedtime.   diltiazem 240 MG 24 hr capsule Commonly known as: TIAZAC Take 240 mg by mouth daily.   docusate sodium 100 MG capsule Commonly known as: COLACE Take 100 mg by mouth daily as needed for mild constipation.   esomeprazole 40 MG capsule Commonly known as: NEXIUM Take 40 mg by mouth 2 (two) times daily as needed (acid reflux/indigestion.).   finasteride 5 MG tablet Commonly known as: PROSCAR Take 5 mg by mouth at bedtime.   furosemide 40 MG tablet Commonly known as: LASIX furosemide 40 mg tablet  TAKE 1 TABLET BY MOUTH EVERY DAY   HYDROcodone-acetaminophen 5-325 MG tablet Commonly known as: NORCO/VICODIN Take 1-2 tablets by mouth every 6 (six) hours as needed for severe pain.   hydroxypropyl methylcellulose /  hypromellose 2.5 % ophthalmic solution Commonly known as: ISOPTO TEARS / GONIOVISC Place 1 drop into both eyes 3  (three) times daily as needed for dry eyes.   irbesartan 150 MG tablet Commonly known as: AVAPRO Take 150 mg by mouth every evening.   isosorbide mononitrate 60 MG 24 hr tablet Commonly known as: IMDUR Take 60 mg by mouth every evening.   loperamide 2 MG tablet Commonly known as: IMODIUM A-D Take 2 mg by mouth 4 (four) times daily as needed for diarrhea or loose stools.   methocarbamol 500 MG tablet Commonly known as: ROBAXIN Take 1 tablet (500 mg total) by mouth every 6 (six) hours as needed for muscle spasms.   nitrofurantoin 50 MG capsule Commonly known as: MACRODANTIN Take 1 capsule (50 mg total) by mouth at bedtime.   potassium chloride SA 20 MEQ tablet Commonly known as: KLOR-CON potassium chloride ER 20 mEq tablet,extended release(part/cryst)  TAKE 1 TABLET BY MOUTH DAILY   pseudoephedrine-acetaminophen 30-500 MG Tabs tablet Commonly known as: TYLENOL SINUS Take 1 tablet by mouth every 4 (four) hours as needed (sinus congestion).   rivaroxaban 10 MG Tabs tablet Commonly known as: XARELTO Take 1 tablet (10 mg total) by mouth daily with breakfast for 20 days. Then resume one 325 mg Aspirin once a day.   simethicone 125 MG chewable tablet Commonly known as: MYLICON Chew 295 mg by mouth every 6 (six) hours as needed for flatulence. Takes gas x   tamsulosin 0.4 MG Caps capsule Commonly known as: FLOMAX Take 0.4 mg by mouth at bedtime.   traMADol 50 MG tablet Commonly known as: ULTRAM Take 50 mg by mouth every 6 (six) hours as needed for pain.            Discharge Care Instructions  (From admission, onward)         Start     Ordered   07/16/20 0000  Weight bearing as tolerated        07/16/20 0737   07/16/20 0000  Change dressing       Comments: You have an adhesive waterproof bandage over the incision. Leave this in place until your first follow-up appointment. Once you remove this you will not need to place another bandage.   07/16/20 0737           Follow-up Information    Gaynelle Arabian, MD. Schedule an appointment as soon as possible for a visit on 07/28/2020.   Specialty: Orthopedic Surgery Why: You are scheduled for a post-operative appointment on 07-28-20 at 1:30 pm.  Contact information: [address] [place] [address] [phone]               Signed: Theresa Duty, PA-C Orthopedic Surgery 07/21/2020, 9:38 AM

## 2020-08-05 DIAGNOSIS — N1339 Other hydronephrosis: Secondary | ICD-10-CM | POA: Diagnosis not present

## 2020-08-14 ENCOUNTER — Ambulatory Visit: Payer: Medicare PPO | Admitting: Internal Medicine

## 2020-08-14 ENCOUNTER — Encounter: Payer: Self-pay | Admitting: Internal Medicine

## 2020-08-14 ENCOUNTER — Other Ambulatory Visit: Payer: Self-pay

## 2020-08-14 VITALS — BP 130/61 | HR 41 | Ht 64.0 in | Wt 149.8 lb

## 2020-08-14 DIAGNOSIS — I2510 Atherosclerotic heart disease of native coronary artery without angina pectoris: Secondary | ICD-10-CM

## 2020-08-14 DIAGNOSIS — I10 Essential (primary) hypertension: Secondary | ICD-10-CM | POA: Diagnosis not present

## 2020-08-14 DIAGNOSIS — R001 Bradycardia, unspecified: Secondary | ICD-10-CM | POA: Diagnosis not present

## 2020-08-14 MED ORDER — DILTIAZEM HCL ER BEADS 120 MG PO CP24: 120.0000 mg | ORAL_CAPSULE | Freq: Every day | ORAL | 3 refills | Status: DC

## 2020-08-14 NOTE — Progress Notes (Signed)
OFFICE NOTE  Chief Complaint:  No complaints  Primary Care Physician: [name], MD  HPI:  [name] Pender Brooke Bonito. is a pleasant 74 year old male who is currently referred to me for evaluation of abnormal EKG. Recently he's had some worsening shortness of breath. He also significant back problems and other medical issues. He is a long-standing smoker with a history of hypertension, dyslipidemia and PAD. He recently had a left femoral-femoral bypass for occluded distal aorta. He had a routine physical performed which demonstrated an abnormal EKG done which showed inferior ST depression and PVCs. This is apparently a new finding. He denies any anginal complaints. He is not particularly active due to problems with his back and peripheral arterial disease.  I saw Mr. Lowden back today For follow-up of his stress test. This is negative for ischemia and showed a small fixed defect which is likely artifact, however scar cannot be excluded as it was a non-gated study. This is somewhat reassuring suggesting that shortness of breath may be more pulmonary in nature. He has reported more persistent, productive cough. He also has a dry cough. He's wondering if this could be related to his lisinopril. He's also noted that his blood pressure is not as well-controlled as it had been in the past with recent increases in diastolic pressure.   Mr. Grieshaber returns today for follow-up. He denies any chest pain or worsening shortness of breath. He told me on New Year's Eve he came down with the flu and had cough for about a month. Eventually he has improved. He continues to have some PVCs bits asymptomatic with this. He denies any claudication symptoms but does have history of femoral femoral bypass grafting. He is followed by vascular surgery for this. Cholesterol is followed by his primary care provider.  02/06/2017  Mr. Raya returns today for follow-up. Since I last saw him he feels like he is doing  fairly well. Unfortunately he is not able to stop smoking due to significant stresses in his life. This is despite having chronic lung disease, PAD and coronary artery disease. He's not had any upper respiratory infections this year. He is followed by vascular surgery due to his history of femoral bypass grafting. He denies any chest pain. EKG shows sinus bradycardia with nonspecific interventricular conduction delay. Blood pressure is well-controlled today.  01/30/2018  Mr. [name] returns today for follow-up.  I last saw him about a year ago.  Unfortunately continues to smoke.  Was recently seen by his pulmonologist to noted that he has severe COPD.  He has been struggling with low back pain as well as right hip pain.  He is received 2 injections of the right hip and still has a gnawing pain.  Apparently he has no cartilage left in the joint and is possibly facing hip replacement.  He is being cared for by Dr. Mardelle Matte.  Blood pressure is mildly elevated today.  He denies any chest pain or worsening shortness of breath.  Recent labs in November 2018 showed total cholesterol 104, HDL 32, LDL 53 and triglycerides 96.  Hemoglobin A1c of 7.1 and serum creatinine 1.2.  08/15/2020  Mr. [name] is seen today for follow-up.  He underwent recent hip surgery and is doing better.  He denies any chest pain or worsening shortness of breath.  Blood pressure appears excellent today.  EKG does show a significant marked sinus bradycardia at 41 however he says he denies any fatigue, chest pain, presyncope or other possible related  symptoms.  His most recent lipid profile showed total cholesterol 95, HDL 35, LDL 46 and triglycerides 71, A1c of 5.5.  PMHx:  Past Medical History:  Diagnosis Date  . AAA (abdominal aortic aneurysm) (HCC)    infrarenal aaa 3.1 x 2.8 per abd/pelvis ct 02-26-20 epic  . Allergy    pollen  . Anginal pain ([place]) 2005  . Arthritis    hips, back, neck   . Asthma   . Atrial fibrillation ([place])    no  documentation in epic/ pt states has never been told has A Fib  . CAD (coronary artery disease)    s/p angioplasty and stenting x3 1994, pt states has this done  . Cataract    small, mild  . Chronic kidney disease    kidney cyst  . Complication of anesthesia    woke up during gall bladder sx yrs ago and with colonscopy  . Complication of anesthesia    pt thinks stopped breathing after gallbladder sx years ago, no record found  . COPD (chronic obstructive pulmonary disease) ([place])   . Dysrhythmia    regular irregular heart beat  . GERD (gastroesophageal reflux disease)    hx of  . History of hiatal hernia   . History of kidney stones   . History of prediabetes    none in last 8 months since 40 lb weight loss  . Hyperlipidemia   . Hypertension   . Lower extremity edema since 02-14-2020   swelling goes down when sleeps resolved 06-29-20  . Neuromuscular disorder ([place])    nerve issues with back   . Peripheral vascular disease ([place])   . PVC's (premature ventricular contractions)    pt denies  . Sleep apnea    wears cpap auto set 5 to 20  . Tuberculosis 1966    Past Surgical History:  Procedure Laterality Date  . APPENDECTOMY    . CHOLECYSTECTOMY  1990s   Gall Bladder  . COLONOSCOPY    . CORONARY ANGIOPLASTY WITH STENT PLACEMENT  1994  . CYSTOSCOPY WITH INSERTION OF UROLIFT N/A 04/14/2020   Procedure: CYSTOSCOPY WITH INSERTION OF UROLIFT;  Surgeon: Festus Aloe, MD;  Location: [facility];  Service: Urology;  Laterality: N/A;  . CYSTOSCOPY WITH STENT PLACEMENT Right 07/07/2020   Procedure: CYSTOSCOPY WITH STENT CHANGE;  Surgeon: Festus Aloe, MD;  Location[facility];  Service: Urology;  Laterality: Right;  . CYSTOSCOPY WITH URETEROSCOPY AND STENT PLACEMENT Bilateral 01/03/2020   Procedure: CYSTOSCOPY WITH RETROGRADE/ URETEROSCOPY AND RIGHT STENT PLACEMENT BLADDER BIOPSY;  Surgeon: Festus Aloe, MD;  Location[facility];  Service: Urology;  Laterality: Bilateral;  ONLY NEEDS 30 MIN  . CYSTOSCOPY WITH URETEROSCOPY AND STENT PLACEMENT Right 04/14/2020   Procedure: CYSTOSCOPY WITH URETEROSCOPY, RETROGRADE  AND STENT REPLACEMENT;  Surgeon: Festus Aloe, MD;  Location: [facility];  Service: Urology;  Laterality: Right;  . EYE SURGERY Bilateral 2019   cataracts  . lumbar disectomy  2005   L3 - 4 - 5  . POLYPECTOMY    . PR VEIN BYPASS GRAFT,AORTO-FEM-POP  08/31/11   Right to Left Fem-Fem  . RENAL ANGIOGRAPHY N/A 02/14/2020   Procedure: RENAL ANGIOGRAPHY;  Surgeon: Elam Dutch, MD;  Location: [facility];  Service: Cardiovascular;  Laterality: N/A;  Bilateral  . TONSILLECTOMY  as child  . TONSILLECTOMY    . TOTAL HIP ARTHROPLASTY Right 07/15/2020   Procedure: TOTAL HIP ARTHROPLASTY ANTERIOR APPROACH;  Surgeon: Wynelle Link,  Pilar Plate, MD;  Location: [facility];  Service: Orthopedics;  Laterality: Right;  1102min  . UMBILICAL HERNIA REPAIR     x 2    FAMHx:  Family History  Problem Relation Age of Onset  . Other Father        ETOH  . Diabetes Father   . Hypertension Father   . Kidney cancer Mother   . Cancer Mother        renal  . Hyperlipidemia Mother   . Hypertension Mother   . Cancer Sister        breast  . Hyperlipidemia Sister   . Hypertension Sister   . Breast cancer Sister   . Hyperlipidemia Brother   . Hypertension Brother   . Prostate cancer Brother   . Colon cancer Neg Hx   . Colon polyps Neg Hx   . Esophageal cancer Neg Hx   . Rectal cancer Neg Hx   . Stomach cancer Neg Hx     SOCHx:   reports that he has been smoking cigarettes. He has a 102.00 pack-year smoking history. He has never used smokeless tobacco. He reports previous alcohol use. He reports that he does not use drugs.  ALLERGIES:  Allergies  Allergen Reactions  . Penicillins     Childhood reaction  REACTION: rapid pulse Did it involve swelling of the face/tongue/throat, SOB, or low BP?  Yes Did it involve sudden or severe rash/hives, skin peeling, or any reaction on the inside of your mouth or nose? Unknown Did you need to seek medical attention at a hospital or doctor's office? yes When did it last happen?childhood  If all above answers are "NO", may proceed with cephalosporin use.   . Other     Oily seafood salmon-- chest pain  . Mold Extract [Trichophyton] Rash    ROS: Pertinent items noted in HPI and remainder of comprehensive ROS otherwise negative.  HOME MEDS: Current Outpatient Medications  Medication Sig Dispense Refill  . albuterol (PROVENTIL HFA) 108 (90 Base) MCG/ACT inhaler Inhale 1 puff into the lungs 2 (two) times daily. 8 g 5  . albuterol (PROVENTIL) (2.5 MG/3ML) 0.083% nebulizer solution USE 1 VIAL VIA NEBULIZER EVERY 6 HOURS AS NEEDED FOR WHEEZING OR SHORTNESS OF BREATH (Patient taking differently: Take 2.5 mg by nebulization every 6 (six) hours as needed for wheezing. ) 225 mL 12  . antiseptic oral rinse (BIOTENE) LIQD 15 mLs by Mouth Rinse route as needed for dry mouth.    Marland Kitchen aspirin 325 MG tablet Take 325 mg by mouth daily.    Marland Kitchen atorvastatin (LIPITOR) 10 MG tablet Take 10 mg by mouth every evening.     . Azelastine-Fluticasone 137-50 MCG/ACT SUSP PLACE 1-2 SPRAYS INTO EACH NOSTRIL AT BEDTIME (Patient taking differently: Place 1-2 sprays into both nostrils 2 (two) times daily as needed (congestion.). ) 23 g 3  . diltiazem (TIAZAC) 240 MG 24 hr capsule Take 240 mg by mouth daily.     Marland Kitchen docusate sodium (COLACE) 100 MG capsule Take 100 mg by mouth daily as needed for mild constipation.    Marland Kitchen esomeprazole (NEXIUM) 40 MG capsule Take 40 mg by mouth 2 (two) times daily as needed (acid reflux/indigestion.).     Marland Kitchen finasteride (PROSCAR) 5 MG tablet Take 5 mg by mouth at bedtime.     . furosemide (LASIX) 40 MG tablet furosemide 40 mg tablet  TAKE 1 TABLET BY MOUTH EVERY DAY    . HYDROcodone-acetaminophen (NORCO/VICODIN) 5-325 MG tablet Take 1-2 tablets  by  mouth every 6 (six) hours as needed for severe pain. 56 tablet 0  . hydroxypropyl methylcellulose / hypromellose (ISOPTO TEARS / GONIOVISC) 2.5 % ophthalmic solution Place 1 drop into both eyes 3 (three) times daily as needed for dry eyes.    Marland Kitchen irbesartan (AVAPRO) 150 MG tablet Take 150 mg by mouth every evening.     . isosorbide mononitrate (IMDUR) 60 MG 24 hr tablet Take 60 mg by mouth every evening.     . loperamide (IMODIUM A-D) 2 MG tablet Take 2 mg by mouth 4 (four) times daily as needed for diarrhea or loose stools.    . Menthol, Topical Analgesic, (BLUE-EMU MAXIMUM STRENGTH EX) Apply 1 application topically 3 (three) times daily as needed (pain.).    Marland Kitchen methocarbamol (ROBAXIN) 500 MG tablet Take 1 tablet (500 mg total) by mouth every 6 (six) hours as needed for muscle spasms. 40 tablet 0  . nitrofurantoin (MACRODANTIN) 50 MG capsule Take 1 capsule (50 mg total) by mouth at bedtime. 30 capsule 0  . potassium chloride SA (KLOR-CON) 20 MEQ tablet potassium chloride ER 20 mEq tablet,extended release(part/cryst)  TAKE 1 TABLET BY MOUTH DAILY    . simethicone (MYLICON) 956 MG chewable tablet Chew 125 mg by mouth every 6 (six) hours as needed for flatulence. Takes gas x    . tamsulosin (FLOMAX) 0.4 MG CAPS capsule Take 0.4 mg by mouth at bedtime.     . traMADol (ULTRAM) 50 MG tablet Take 50 mg by mouth every 6 (six) hours as needed for pain.    . Budeson-Glycopyrrol-Formoterol (BREZTRI AEROSPHERE) 160-9-4.8 MCG/ACT AERO Inhale 2 puffs into the lungs in the morning and at bedtime. (Patient not taking: Reported on 06/24/2020) 2 g 0  . pseudoephedrine-acetaminophen (TYLENOL SINUS) 30-500 MG TABS tablet Take 1 tablet by mouth every 4 (four) hours as needed (sinus congestion). (Patient not taking: Reported on 06/24/2020)    . rivaroxaban (XARELTO) 10 MG TABS tablet Take 1 tablet (10 mg total) by mouth daily with breakfast for 20 days. Then resume one 325 mg Aspirin once a day. (Patient not taking: Reported on  08/14/2020) 20 tablet 0   No current facility-administered medications for this visit.    LABS/IMAGING: No results found for this or any previous visit (from the past 48 hour(s)). No results found.  VITALS: BP 130/61   Pulse (!) 41   Ht 5\' 4"  (1.626 m)   Wt 149 lb 12.8 oz (67.9 kg)   SpO2 94%   BMI 25.71 kg/m   EXAM: General appearance: alert and no distress Neck: no carotid bruit and no JVD Lungs: clear to auscultation bilaterally Heart: Regular bradycardia Abdomen: soft, non-tender; bowel sounds normal; no masses,  no organomegaly Extremities: extremities normal, atraumatic, no cyanosis or edema Pulses: 2+ and symmetric Skin: Skin color, texture, turgor normal. No rashes or lesions Neurologic: Grossly normal Psych: Pleasant  EKG: Marked sinus bradycardia at 41, nonspecific ST and T wave changes-personally reviewed  ASSESSMENT: 1. CAD status post PCI 3 in 1994 2. Abnormal EKG with ischemic findings - negative nuclear stress test (2014) 3. PVCs 4. Peripheral arterial disease status post left femorofemoral bypass 5. Hypertension 6. Dyslipidemia 7. Smoking 8. Obesity 9.  COPD  PLAN: 1.   Mr. Brumbaugh has a notable marked sinus bradycardia today on EKG.  He says he is asymptomatic with this.  There is been a progressive decline in his heart rate and has also been a significant decrease in his weight down about  10 or 11 pounds.  He also had recent hip surgery.  Suspect he does not need as much medication.  I will decrease his diltiazem from 240 to 120 mg daily.  He should monitor his heart rate and symptoms with this.  Follow-up with me otherwise annually or sooner as necessary.  Pixie Casino, MD, [facility], [place] Director of the Advanced Lipid Disorders &  Cardiovascular Risk Reduction Clinic Diplomate of the American Board of Clinical Lipidology Attending Cardiologist  Direct Dial: [phone]  Fax: [phone]  Website:   [email or web address] 08/14/2020, 3:06 PM

## 2020-08-14 NOTE — Patient Instructions (Signed)
Medication Instructions:  Dr. Lemar Livings has decreased the dose of diltiazem from 240mg  to 120mg  daily Please continue all other current medications  *If you need a refill on your cardiac medications before your next appointment, please call your pharmacy*  Follow-Up: At [facility], you and your health needs are our priority.  As part of our continuing mission to provide you with exceptional heart care, we have created designated Provider Care Teams.  These Care Teams include your primary Cardiologist (physician) and Advanced Practice Providers (APPs -  Physician Assistants and Nurse Practitioners) who all work together to provide you with the care you need, when you need it.  We recommend signing up for the patient portal called "MyChart".  Sign up information is provided on this After Visit Summary.  MyChart is used to connect with patients for Virtual Visits (Telemedicine).  Patients are able to view lab/test results, encounter notes, upcoming appointments, etc.  Non-urgent messages can be sent to your provider as well.   To learn more about what you can do with MyChart, go to [email or web address].    Your next appointment:   12 month(s)  The format for your next appointment:   In Person  Provider:   You may see Pixie Casino, MD or one of the following Advanced Practice Providers on your designated Care Team:    Almyra Deforest, PA-C  Fabian Sharp, PA-C or   Roby Lofts, [place]    Other Instructions

## 2020-08-15 ENCOUNTER — Encounter: Payer: Self-pay | Admitting: Internal Medicine

## 2020-08-25 DIAGNOSIS — Z471 Aftercare following joint replacement surgery: Secondary | ICD-10-CM | POA: Diagnosis not present

## 2020-08-25 DIAGNOSIS — Z96641 Presence of right artificial hip joint: Secondary | ICD-10-CM | POA: Diagnosis not present

## 2020-09-11 DIAGNOSIS — E785 Hyperlipidemia, unspecified: Secondary | ICD-10-CM | POA: Diagnosis not present

## 2020-09-11 DIAGNOSIS — R739 Hyperglycemia, unspecified: Secondary | ICD-10-CM | POA: Diagnosis not present

## 2020-09-11 DIAGNOSIS — Z125 Encounter for screening for malignant neoplasm of prostate: Secondary | ICD-10-CM | POA: Diagnosis not present

## 2020-09-11 DIAGNOSIS — E1159 Type 2 diabetes mellitus with other circulatory complications: Secondary | ICD-10-CM | POA: Diagnosis not present

## 2020-09-17 DIAGNOSIS — N130 Hydronephrosis with ureteropelvic junction obstruction: Secondary | ICD-10-CM | POA: Diagnosis not present

## 2020-09-24 DIAGNOSIS — E785 Hyperlipidemia, unspecified: Secondary | ICD-10-CM | POA: Diagnosis not present

## 2020-09-24 DIAGNOSIS — R338 Other retention of urine: Secondary | ICD-10-CM | POA: Diagnosis not present

## 2020-09-24 DIAGNOSIS — J449 Chronic obstructive pulmonary disease, unspecified: Secondary | ICD-10-CM | POA: Diagnosis not present

## 2020-09-24 DIAGNOSIS — E1159 Type 2 diabetes mellitus with other circulatory complications: Secondary | ICD-10-CM | POA: Diagnosis not present

## 2020-09-24 DIAGNOSIS — Z0000 Encounter for general adult medical examination without abnormal findings: Secondary | ICD-10-CM | POA: Diagnosis not present

## 2020-09-24 DIAGNOSIS — Z23 Encounter for immunization: Secondary | ICD-10-CM | POA: Diagnosis not present

## 2020-09-24 DIAGNOSIS — R82998 Other abnormal findings in urine: Secondary | ICD-10-CM | POA: Diagnosis not present

## 2020-09-24 DIAGNOSIS — N1831 Chronic kidney disease, stage 3a: Secondary | ICD-10-CM | POA: Diagnosis not present

## 2020-09-24 DIAGNOSIS — I10 Essential (primary) hypertension: Secondary | ICD-10-CM | POA: Diagnosis not present

## 2020-09-24 DIAGNOSIS — F17200 Nicotine dependence, unspecified, uncomplicated: Secondary | ICD-10-CM | POA: Diagnosis not present

## 2020-10-07 ENCOUNTER — Telehealth: Payer: Self-pay | Admitting: Internal Medicine

## 2020-10-07 ENCOUNTER — Other Ambulatory Visit: Payer: Self-pay | Admitting: Urology

## 2020-10-07 DIAGNOSIS — I2510 Atherosclerotic heart disease of native coronary artery without angina pectoris: Secondary | ICD-10-CM

## 2020-10-07 NOTE — Telephone Encounter (Signed)
   [facility] Pre-operative Risk Assessment    HEARTCARE STAFF: - Please ensure there is not already an duplicate clearance open for this procedure. - Under Visit Info/Reason for Call, type in Other and utilize the format Clearance MM/DD/YY or Clearance TBD. Do not use dashes or single digits. - If request is for dental extraction, please clarify the # of teeth to be extracted.  Request for surgical clearance:  1. What type of surgery is being performed? Robotic Right Pyeloplasty   2. When is this surgery scheduled? 11/20/20  3. What type of clearance is required (medical clearance vs. Pharmacy clearance to hold med vs. Both)? Both   4. Are there any medications that need to be held prior to surgery and how long? Aspirin 5 days   5. Practice name and name of physician performing surgery? [facility], Dr. Louis Meckel   6. What is the office phone number? [phone]   7.   What is the office fax number? [phone]  8.   Anesthesia type (None, local, MAC, general) ? General    Trilby Drummer 10/07/2020, 12:46 PM  _________________________________________________________________   (provider comments below)

## 2020-10-07 NOTE — Telephone Encounter (Signed)
Dr. Debara Pickett, This patient has a remote history of CAD and PCI in 1994 as well as PAD with hx of fem-fem bypass. He did well during and after his hip surgery in May of this year. However, his activity level hasn't changed much and he still can't complete 4.0 METS. You saw him in clinic 08/14/20. In your opinion, can he proceed with kidney surgery without repeat stress test? Last myoview in 2016 nonischemic. Denies chest pain, but is quite sedentary.

## 2020-10-07 NOTE — Telephone Encounter (Signed)
Would recommend repeat lexiscan myoview for clearance prior to surgery due to sedetary lifestyle, no ischemia evaluation since 2016 and prior coronary disease.  Thanks.  -Mali

## 2020-10-08 ENCOUNTER — Encounter: Payer: Self-pay | Admitting: Internal Medicine

## 2020-10-08 NOTE — Addendum Note (Signed)
Addended by: Ardis Hughs on: 10/08/2020 08:54 AM   Modules accepted: Orders

## 2020-10-08 NOTE — Telephone Encounter (Signed)
Per Dr. Debara Pickett pt will need to be set up for a [place] for pre op assessment. I will place the order and have [facility] reach out to the pt with appt for Lexiscan.

## 2020-10-14 ENCOUNTER — Telehealth (HOSPITAL_COMMUNITY): Payer: Self-pay | Admitting: *Deleted

## 2020-10-14 NOTE — Telephone Encounter (Signed)
Patient scheduled for [facility] 10/28

## 2020-10-14 NOTE — Telephone Encounter (Signed)
Close encounter 

## 2020-10-15 ENCOUNTER — Other Ambulatory Visit: Payer: Self-pay

## 2020-10-15 ENCOUNTER — Ambulatory Visit (HOSPITAL_COMMUNITY)
Admission: RE | Admit: 2020-10-15 | Discharge: 2020-10-15 | Disposition: A | Discharge status: Home / Self Care | Payer: Medicare PPO | Source: Ambulatory Visit | Attending: Cardiovascular Disease | Admitting: Cardiovascular Disease

## 2020-10-15 DIAGNOSIS — I2510 Atherosclerotic heart disease of native coronary artery without angina pectoris: Secondary | ICD-10-CM | POA: Diagnosis not present

## 2020-10-15 LAB — MYOCARDIAL PERFUSION IMAGING
LV dias vol: 110 mL (ref 62–150)
LV sys vol: 45 mL
Peak HR: 81 {beats}/min
Rest HR: 52 {beats}/min
SDS: 3
SSS: 7
TID: 1.03

## 2020-10-15 MED ORDER — TECHNETIUM TC 99M TETROFOSMIN IV KIT
30.8000 | PACK | Freq: Once | INTRAVENOUS | Status: AC | PRN
  Administered 2020-10-15: 30.8 via INTRAVENOUS
  Filled 2020-10-15: qty 31

## 2020-10-15 MED ORDER — TECHNETIUM TC 99M TETROFOSMIN IV KIT
9.6000 | PACK | Freq: Once | INTRAVENOUS | Status: AC | PRN
  Administered 2020-10-15: 9.6 via INTRAVENOUS
  Filled 2020-10-15: qty 10

## 2020-10-15 MED ORDER — REGADENOSON 0.4 MG/5ML IV SOLN
0.4000 mg | Freq: Once | INTRAVENOUS | Status: AC
  Administered 2020-10-15: 0.4 mg via INTRAVENOUS

## 2020-10-19 ENCOUNTER — Ambulatory Visit: Payer: Medicare PPO | Admitting: Podiatry

## 2020-10-19 ENCOUNTER — Other Ambulatory Visit: Payer: Self-pay

## 2020-10-19 ENCOUNTER — Encounter: Payer: Self-pay | Admitting: Podiatry

## 2020-10-19 DIAGNOSIS — M79676 Pain in unspecified toe(s): Secondary | ICD-10-CM | POA: Diagnosis not present

## 2020-10-19 DIAGNOSIS — B351 Tinea unguium: Secondary | ICD-10-CM | POA: Diagnosis not present

## 2020-10-19 NOTE — Telephone Encounter (Signed)
Myoview was low risk, plan to clear the patient. Urology service requested to hold aspirin for 5 days prior to the procedure. Dr. Debara Pickett, would you be ok with hold aspirin or do you recommend continue aspirin through the procedure.

## 2020-10-20 DIAGNOSIS — N130 Hydronephrosis with ureteropelvic junction obstruction: Secondary | ICD-10-CM | POA: Diagnosis not present

## 2020-10-20 NOTE — Telephone Encounter (Signed)
   Primary Cardiologist: Pixie Casino, MD  Chart reviewed as part of pre-operative protocol coverage. Given past medical history and time since last visit, based on ACC/AHA guidelines, Dean [name]. would be at acceptable risk for the planned procedure without further cardiovascular testing. Recent nuclear stress test obtained on 10/15/2020 was normal.   The patient was advised that if he develops new symptoms prior to surgery to contact our office to arrange for a follow-up visit, and he verbalized understanding.  I will route this recommendation to the requesting party via Epic fax function and remove from pre-op pool.  Please call with questions. Per Dr. Debara Pickett, ok to hold aspirin for 5-7 days prior to the surgery and restart as soon as possible afterward at the surgeon's discretion.   [place], [place] 10/20/2020, 1:32 PM

## 2020-10-20 NOTE — Telephone Encounter (Signed)
Yes - ok to hold aspirin prior to the procedure.  Mali

## 2020-10-22 NOTE — Progress Notes (Signed)
Subjective:  Patient ID: Dean Filter., male    DOB: 1946/01/20,  MRN: [id number]  Dean [name]. presents to clinic today for for at risk foot care. Patient has h/o PAD and painful thick toenails that are difficult to trim. Pain interferes with ambulation. Aggravating factors include wearing enclosed shoe gear. Pain is relieved with periodic professional debridement..  74 y.o. male presents with the above complaint.  He states he may have to have his kidney removed. He voices no new pedal problems on today's visit.  Review of Systems: Negative except as noted in the HPI. Past Medical History:  Diagnosis Date  . AAA (abdominal aortic aneurysm) (HCC)    infrarenal aaa 3.1 x 2.8 per abd/pelvis ct 02-26-20 epic  . Allergy    pollen  . Anginal pain ([place]) 2005  . Arthritis    hips, back, neck   . Asthma   . Atrial fibrillation ([place])    no documentation in epic/ pt states has never been told has A Fib  . CAD (coronary artery disease)    s/p angioplasty and stenting x3 1994, pt states has this done  . Cataract    small, mild  . Chronic kidney disease    kidney cyst  . Complication of anesthesia    woke up during gall bladder sx yrs ago and with colonscopy  . Complication of anesthesia    pt thinks stopped breathing after gallbladder sx years ago, no record found  . COPD (chronic obstructive pulmonary disease) ([place])   . Dysrhythmia    regular irregular heart beat  . GERD (gastroesophageal reflux disease)    hx of  . History of hiatal hernia   . History of kidney stones   . History of prediabetes    none in last 8 months since 40 lb weight loss  . Hyperlipidemia   . Hypertension   . Lower extremity edema since 02-14-2020   swelling goes down when sleeps resolved 06-29-20  . Neuromuscular disorder ([place])    nerve issues with back   . Peripheral vascular disease ([place])   . PVC's (premature ventricular contractions)    pt denies  . Sleep apnea    wears cpap auto set 5  to 20  . Tuberculosis 1966   Past Surgical History:  Procedure Laterality Date  . APPENDECTOMY    . CHOLECYSTECTOMY  1990s   Gall Bladder  . COLONOSCOPY    . CORONARY ANGIOPLASTY WITH STENT PLACEMENT  1994  . CYSTOSCOPY WITH INSERTION OF UROLIFT N/A 04/14/2020   Procedure: CYSTOSCOPY WITH INSERTION OF UROLIFT;  Surgeon: Festus Aloe, MD;  Location: [facility];  Service: Urology;  Laterality: N/A;  . CYSTOSCOPY WITH STENT PLACEMENT Right 07/07/2020   Procedure: CYSTOSCOPY WITH STENT CHANGE;  Surgeon: Festus Aloe, MD;  Location: [facility];  Service: Urology;  Laterality: Right;  . CYSTOSCOPY WITH URETEROSCOPY AND STENT PLACEMENT Bilateral 01/03/2020   Procedure: CYSTOSCOPY WITH RETROGRADE/ URETEROSCOPY AND RIGHT STENT PLACEMENT BLADDER BIOPSY;  Surgeon: Festus Aloe, MD;  Location: [facility];  Service: Urology;  Laterality: Bilateral;  ONLY NEEDS 30 MIN  . CYSTOSCOPY WITH URETEROSCOPY AND STENT PLACEMENT Right 04/14/2020   Procedure: CYSTOSCOPY WITH URETEROSCOPY, RETROGRADE  AND STENT REPLACEMENT;  Surgeon: Festus Aloe, MD;  [facility];  Service: Urology;  Laterality: Right;  . EYE SURGERY Bilateral 2019   cataracts  . lumbar disectomy  2005   L3 - 4 - 5  .  POLYPECTOMY    . PR VEIN BYPASS GRAFT,AORTO-FEM-POP  08/31/11   Right to Left Fem-Fem  . RENAL ANGIOGRAPHY N/A 02/14/2020   Procedure: RENAL ANGIOGRAPHY;  Surgeon: Elam Dutch, MD;  Location: [facility];  Service: Cardiovascular;  Laterality: N/A;  Bilateral  . TONSILLECTOMY  as child  . TONSILLECTOMY    . TOTAL HIP ARTHROPLASTY Right 07/15/2020   Procedure: TOTAL HIP ARTHROPLASTY ANTERIOR APPROACH;  Surgeon: Gaynelle Arabian, MD;  Location: [facility];  Service: Orthopedics;  Laterality: Right;  158min  . UMBILICAL HERNIA REPAIR     x 2    Current Outpatient Medications:  .  albuterol (PROVENTIL HFA) 108 (90 Base) MCG/ACT inhaler,  Inhale 1 puff into the lungs 2 (two) times daily., Disp: 8 g, Rfl: 5 .  albuterol (PROVENTIL) (2.5 MG/3ML) 0.083% nebulizer solution, USE 1 VIAL VIA NEBULIZER EVERY 6 HOURS AS NEEDED FOR WHEEZING OR SHORTNESS OF BREATH (Patient taking differently: Take 2.5 mg by nebulization every 6 (six) hours as needed for wheezing. ), Disp: 225 mL, Rfl: 12 .  antiseptic oral rinse (BIOTENE) LIQD, 15 mLs by Mouth Rinse route as needed for dry mouth., Disp: , Rfl:  .  aspirin 325 MG tablet, Take 325 mg by mouth daily., Disp: , Rfl:  .  atorvastatin (LIPITOR) 10 MG tablet, Take 10 mg by mouth every evening. , Disp: , Rfl:  .  Azelastine-Fluticasone 137-50 MCG/ACT SUSP, PLACE 1-2 SPRAYS INTO EACH NOSTRIL AT BEDTIME (Patient taking differently: Place 1-2 sprays into both nostrils 2 (two) times daily as needed (congestion.). ), Disp: 23 g, Rfl: 3 .  Budeson-Glycopyrrol-Formoterol (BREZTRI AEROSPHERE) 160-9-4.8 MCG/ACT AERO, Inhale 2 puffs into the lungs in the morning and at bedtime., Disp: 2 g, Rfl: 0 .  diltiazem (TIAZAC) 120 MG 24 hr capsule, Take 1 capsule (120 mg total) by mouth daily., Disp: 90 capsule, Rfl: 3 .  docusate sodium (COLACE) 100 MG capsule, Take 100 mg by mouth daily as needed for mild constipation., Disp: , Rfl:  .  ergocalciferol (VITAMIN D2) 1.25 MG (50000 UT) capsule, ergocalciferol (vitamin D2) 1,250 mcg (50,000 unit) capsule  TAKE 1 CAPSULE BY MOUTH ONCE WEEKLY, Disp: , Rfl:  .  esomeprazole (NEXIUM) 40 MG capsule, Take 40 mg by mouth 2 (two) times daily as needed (acid reflux/indigestion.). , Disp: , Rfl:  .  finasteride (PROSCAR) 5 MG tablet, Take 5 mg by mouth at bedtime. , Disp: , Rfl:  .  furosemide (LASIX) 40 MG tablet, furosemide 40 mg tablet  TAKE 1 TABLET BY MOUTH EVERY DAY, Disp: , Rfl:  .  HYDROcodone-acetaminophen (NORCO/VICODIN) 5-325 MG tablet, Take 1-2 tablets by mouth every 6 (six) hours as needed for severe pain., Disp: 56 tablet, Rfl: 0 .  hydroxypropyl methylcellulose /  hypromellose (ISOPTO TEARS / GONIOVISC) 2.5 % ophthalmic solution, Place 1 drop into both eyes 3 (three) times daily as needed for dry eyes., Disp: , Rfl:  .  irbesartan (AVAPRO) 150 MG tablet, Take 150 mg by mouth every evening. , Disp: , Rfl:  .  isosorbide mononitrate (IMDUR) 60 MG 24 hr tablet, Take 60 mg by mouth every evening. , Disp: , Rfl:  .  loperamide (IMODIUM A-D) 2 MG tablet, Take 2 mg by mouth 4 (four) times daily as needed for diarrhea or loose stools., Disp: , Rfl:  .  Menthol, Topical Analgesic, (BLUE-EMU MAXIMUM STRENGTH EX), Apply 1 application topically 3 (three) times daily as needed (pain.)., Disp: , Rfl:  .  nitrofurantoin (MACRODANTIN)  50 MG capsule, Take 1 capsule (50 mg total) by mouth at bedtime., Disp: 30 capsule, Rfl: 0 .  potassium chloride SA (KLOR-CON) 20 MEQ tablet, potassium chloride ER 20 mEq tablet,extended release(part/cryst)  TAKE 1 TABLET BY MOUTH DAILY, Disp: , Rfl:  .  pseudoephedrine-acetaminophen (TYLENOL SINUS) 30-500 MG TABS tablet, Take 1 tablet by mouth every 4 (four) hours as needed (sinus congestion). , Disp: , Rfl:  .  simethicone (MYLICON) 440 MG chewable tablet, Chew 125 mg by mouth every 6 (six) hours as needed for flatulence. Takes gas x, Disp: , Rfl:  .  tamsulosin (FLOMAX) 0.4 MG CAPS capsule, Take 0.4 mg by mouth at bedtime. , Disp: , Rfl:  .  traMADol (ULTRAM) 50 MG tablet, Take 50 mg by mouth every 6 (six) hours as needed for pain., Disp: , Rfl:  .  Vitamin D, Ergocalciferol, (DRISDOL) 1.25 MG (50000 UNIT) CAPS capsule, , Disp: , Rfl:  .  methocarbamol (ROBAXIN) 500 MG tablet, Take 1 tablet (500 mg total) by mouth every 6 (six) hours as needed for muscle spasms., Disp: 40 tablet, Rfl: 0 .  rivaroxaban (XARELTO) 10 MG TABS tablet, Take 1 tablet (10 mg total) by mouth daily with breakfast for 20 days. Then resume one 325 mg Aspirin once a day. (Patient not taking: Reported on 08/14/2020), Disp: 20 tablet, Rfl: 0 Allergies  Allergen Reactions  .  Penicillins     Childhood reaction  REACTION: rapid pulse Did it involve swelling of the face/tongue/throat, SOB, or low BP? Yes Did it involve sudden or severe rash/hives, skin peeling, or any reaction on the inside of your mouth or nose? Unknown Did you need to seek medical attention at a hospital or doctor's office? yes When did it last happen?childhood  If all above answers are "NO", may proceed with cephalosporin use.   . Other     Oily seafood salmon-- chest pain  . Mold Extract [Trichophyton] Rash   Social History   Occupational History  . 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 Smoking status: Current Every Day Smoker    Packs/day: 2.00    Years: 51.00    Pack years: 102.00    Types: Cigarettes  . Smokeless tobacco: Never Used  . Tobacco comment: smoking a little more than a pack a day d/t stress and pain  Vaping Use  . Vaping Use: Former  Substance and Sexual Activity  . Alcohol use: Not Currently    Alcohol/week: 0.0 standard drinks  . Drug use: No  . Sexual activity: Not on file    Objective:   Constitutional Dean Victorio. is a pleasant 74 y.o. Caucasian male, WD, WN in NAD.Marland Kitchen AAO x 3.   Vascular Dorsalis pedis pulses palpable bilaterally. Posterior tibial pulses palpable bilaterally.Capillary refill normal to all digits. No cyanosis or clubbing noted. Pedal hair growth normal b/l. Lower extremity skin temperature gradient within normal limits.  Neurologic Normal speech. Oriented to person, place, and time. Epicritic sensation to light touch grossly present bilaterally. Protective sensation intact 5/5 intact bilaterally with 10g monofilament b/l. Vibratory sensation intact b/l.   Dermatologic  Pedal skin with normal turgor, texture and tone bilaterally. No open wounds bilaterally. No interdigital macerations bilaterally. Toenails 2-5 bilaterally elongated, discolored, dystrophic, thickened, and crumbly with  subungual debris and tenderness to dorsal palpation. Anonychia noted L hallux and R hallux. Nailbed(s) epithelialized.   Orthopedic: Normal muscle strength 5/5 to all lower extremity muscle groups bilaterally.  No pain crepitus or joint limitation noted with ROM b/l. Hallux valgus with bunion deformity noted b/l lower extremities. No bony tenderness.    Radiographs: None Assessment:   1. Pain due to onychomycosis of toenail    Plan:  Patient was evaluated and treated and all questions answered.  Onychomycosis with pain -Nails palliatively debridement as below -Educated on self-care  Procedure: Nail Debridement Rationale: Pain Type of Debridement: manual, sharp debridement. Instrumentation: Nail nipper, rotary burr. Number of Nails: 8 -Examined patient. -Toenails 2-5 b/l were debrided in length and girth with sterile nail nippers and dremel without iatrogenic bleeding. Bilateral hallux nailplates gently debrided from the remaining attachments to digits. Nailbeds cleansed with alcohol. Triple antibiotic ointment applied. No further treatment required. -Patient to report any pedal injuries to medical professional immediately. -Patient to continue soft, supportive shoe gear daily. -Patient/POA to call should there be question/concern in the interim.  Return in about 3 months (around 01/19/2021).  [name], DPM

## 2020-11-09 NOTE — Patient Instructions (Addendum)
DUE TO COVID-19 ONLY ONE VISITOR IS ALLOWED TO COME WITH YOU AND STAY IN THE WAITING ROOM ONLY DURING PRE OP AND PROCEDURE.   IF YOU WILL BE ADMITTED INTO THE HOSPITAL YOU ARE ALLOWED ONE SUPPORT PERSON DURING VISITATION HOURS ONLY (10AM -8PM)   . The support person may change daily. . The support person must pass our screening, gel in and out, and wear a mask at all times, including in the patient's room. . Patients must also wear a mask when staff or their support person are in the room.   COVID SWAB TESTING MUST BE COMPLETED ON:  Tuesday, 11-17-20 @ 12:50 PM   [address] [place], [place] [address]  (Must self quarantine after testing. Follow instructions on handout.)        Your procedure is scheduled on: Friday, 11-20-20   Report to [facility] Main  Entrance   Report to Short Stay at 5:30 AM   [facility])    Call this number if you have problems the morning of surgery [phone]     Bring CPAP mask and tubing day of surgery   Do not eat food :After Midnight.   May have liquids until 4:30 AM day of surgery  CLEAR LIQUID DIET  Foods Allowed                                                                     Foods Excluded  Water, Black Coffee and tea, regular and decaf              liquids that you cannot  Plain Jell-O in any flavor  (No red)                                     see through such as: Fruit ices (not with fruit pulp)                                      milk, soups, orange juice              Iced Popsicles (No red)                                      All solid food                                   Apple juices Sports drinks like Gatorade (No red) Lightly seasoned clear broth or consume(fat free) Sugar, honey syrup   Oral Hygiene is also important to reduce your risk of infection.                                    Remember - BRUSH YOUR TEETH THE MORNING OF SURGERY WITH YOUR REGULAR TOOTHPASTE   Do NOT smoke after Midnight   Take these  medicines the morning of surgery with  A SIP OF WATER:   Diltiazem, Nexium                                You may not have any metal on your body including  jewelry, and body piercings             Do not wear  lotions, powders, perfumes/cologne, or deodorant             Men may shave face and neck.   Do not bring valuables to the hospital. [facility].   Contacts, dentures or bridgework may not be worn into surgery.   Bring small overnight bag day of surgery.    Special Instructions: Bring a copy of your healthcare power of attorney and living will documents         the day of surgery if you haven't scanned them in before.              Please read over the following fact sheets you were given: IF YOU HAVE QUESTIONS ABOUT YOUR PRE OP INSTRUCTIONS PLEASE CALL [phone]   [facility] - Preparing for Surgery Before surgery, you can play an important role.  Because skin is not sterile, your skin needs to be as free of germs as possible.  You can reduce the number of germs on your skin by washing with CHG (chlorahexidine gluconate) soap before surgery.  CHG is an antiseptic cleaner which kills germs and bonds with the skin to continue killing germs even after washing. Please DO NOT use if you have an allergy to CHG or antibacterial soaps.  If your skin becomes reddened/irritated stop using the CHG and inform your nurse when you arrive at Short Stay. Do not shave (including legs and underarms) for at least 48 hours prior to the first CHG shower.  You may shave your face/neck.  Please follow these instructions carefully:  1.  Shower with CHG Soap the night before surgery and the  morning of surgery.  2.  If you choose to wash your hair, wash your hair first as usual with your normal  shampoo.  3.  After you shampoo, rinse your hair and body thoroughly to remove the shampoo.                             4.  Use CHG as you would any other liquid soap.  You can  apply chg directly to the skin and wash.  Gently with a scrungie or clean washcloth.  5.  Apply the CHG Soap to your body ONLY FROM THE NECK DOWN.   Do   not use on face/ open                           Wound or open sores. Avoid contact with eyes, ears mouth and   genitals (private parts).                       Wash face,  Genitals (private parts) with your normal soap.             6.  Wash thoroughly, paying special attention to the area where your    surgery  will be performed.  7.  Thoroughly rinse your body with warm water from the neck down.  8.  DO NOT shower/wash with your normal soap after using and rinsing off the CHG Soap.                9.  Pat yourself dry with a clean towel.            10.  Wear clean pajamas.            11.  Place clean sheets on your bed the night of your first shower and do not  sleep with pets. Day of Surgery : Do not apply any lotions/deodorants the morning of surgery.  Please wear clean clothes to the hospital/surgery center.  FAILURE TO FOLLOW THESE INSTRUCTIONS MAY RESULT IN THE CANCELLATION OF YOUR SURGERY  PATIENT SIGNATURE_________________________________  NURSE SIGNATURE__________________________________  ________________________________________________________________________  WHAT IS A BLOOD TRANSFUSION? Blood Transfusion Information  A transfusion is the replacement of blood or some of its parts. Blood is made up of multiple cells which provide different functions.  Red blood cells carry oxygen and are used for blood loss replacement.  White blood cells fight against infection.  Platelets control bleeding.  Plasma helps clot blood.  Other blood products are available for specialized needs, such as hemophilia or other clotting disorders. BEFORE THE TRANSFUSION  Who gives blood for transfusions?   Healthy volunteers who are fully evaluated to make sure their blood is safe. This is blood bank blood. Transfusion therapy is the safest it  has ever been in the practice of medicine. Before blood is taken from a donor, a complete history is taken to make sure that person has no history of diseases nor engages in risky social behavior (examples are intravenous drug use or sexual activity with multiple partners). The donor's travel history is screened to minimize risk of transmitting infections, such as malaria. The donated blood is tested for signs of infectious diseases, such as HIV and hepatitis. The blood is then tested to be sure it is compatible with you in order to minimize the chance of a transfusion reaction. If you or a relative donates blood, this is often done in anticipation of surgery and is not appropriate for emergency situations. It takes many days to process the donated blood. RISKS AND COMPLICATIONS Although transfusion therapy is very safe and saves many lives, the main dangers of transfusion include:   Getting an infectious disease.  Developing a transfusion reaction. This is an allergic reaction to something in the blood you were given. Every precaution is taken to prevent this. The decision to have a blood transfusion has been considered carefully by your caregiver before blood is given. Blood is not given unless the benefits outweigh the risks. AFTER THE TRANSFUSION  Right after receiving a blood transfusion, you will usually feel much better and more energetic. This is especially true if your red blood cells have gotten low (anemic). The transfusion raises the level of the red blood cells which carry oxygen, and this usually causes an energy increase.  The nurse administering the transfusion will monitor you carefully for complications. HOME CARE INSTRUCTIONS  No special instructions are needed after a transfusion. You may find your energy is better. Speak with your caregiver about any limitations on activity for underlying diseases you may have. SEEK MEDICAL CARE IF:   Your condition is not improving after your  transfusion.  You develop redness or irritation at the intravenous (IV) site. SEEK IMMEDIATE MEDICAL CARE IF:  Any of the following symptoms occur over the next 12 hours:  Shaking chills.  You have a temperature by mouth above 102 F (38.9 C), not controlled by medicine.  Chest, back, or muscle pain.  People around you feel you are not acting correctly or are confused.  Shortness of breath or difficulty breathing.  Dizziness and fainting.  You get a rash or develop hives.  You have a decrease in urine output.  Your urine turns a dark color or changes to pink, red, or brown. Any of the following symptoms occur over the next 10 days:  You have a temperature by mouth above 102 F (38.9 C), not controlled by medicine.  Shortness of breath.  Weakness after normal activity.  The white part of the eye turns yellow (jaundice).  You have a decrease in the amount of urine or are urinating less often.  Your urine turns a dark color or changes to pink, red, or brown. Document Released: 12/02/2000 Document Revised: 02/27/2012 Document Reviewed: 07/21/2008 [facility] Patient Information 2014 [place], [place].  _______________________________________________________________________

## 2020-11-09 NOTE — Progress Notes (Addendum)
COVID Vaccine Completed: x2 Date COVID Vaccine completed:  01-31-20 & 02-24-20  COVID vaccine manufacturer: [employer]   PCP - [name], MD Cardiologist - Lyman Bishop, MD  Cardiac Clearance in Epic dated 10-20-20 by Almyra Deforest, PA  Chest x-ray - 1 view 07-16-20 in Epic EKG - 08-14-20 in Epic  Stress Test - 10-15-20 in Epic  ECHO - 08-23-19 in Epic Cardiac Cath -  Pacemaker/ICD device last checked:  Sleep Study - 07-19-1999.  +sleep apnea CPAP - Yes  Fasting Blood Sugar -  Checks Blood Sugar _____ times a day  Blood Thinner Instructions: Aspirin Instructions:  ASA 325 mg.  Okay to hold 5-7 days preop per clearance dated 10-20-20.  Pt aware Last Dose:  Anesthesia review: CAD, HTN, coronary atherosclerosis, PVCs, PAD, OSA, COPD, AAA.  bradycardia.  Hx of left femoral-femoral bypass  Patient has shortness of breath with exertion, states this has been his baseline for a few years.  No fever, cough and chest pain at PAT appointment.  Pt has a chair lift, has not tried to walk up stairs since hip replacement.   Is able to do ADL's without assistance   Patient verbalized understanding of instructions that were given to them at the PAT appointment. Patient was also instructed that they will need to review over the PAT instructions again at home before surgery.

## 2020-11-10 ENCOUNTER — Encounter (HOSPITAL_COMMUNITY): Payer: Self-pay

## 2020-11-10 ENCOUNTER — Encounter (HOSPITAL_COMMUNITY)
Admission: RE | Admit: 2020-11-10 | Discharge: 2020-11-10 | Disposition: A | Discharge status: Home / Self Care | Payer: Medicare PPO | Source: Ambulatory Visit | Attending: Urology | Admitting: Urology

## 2020-11-10 ENCOUNTER — Other Ambulatory Visit: Payer: Self-pay

## 2020-11-10 DIAGNOSIS — I2510 Atherosclerotic heart disease of native coronary artery without angina pectoris: Secondary | ICD-10-CM | POA: Insufficient documentation

## 2020-11-10 DIAGNOSIS — Z01812 Encounter for preprocedural laboratory examination: Secondary | ICD-10-CM | POA: Insufficient documentation

## 2020-11-10 DIAGNOSIS — K219 Gastro-esophageal reflux disease without esophagitis: Secondary | ICD-10-CM | POA: Diagnosis not present

## 2020-11-10 DIAGNOSIS — Z7982 Long term (current) use of aspirin: Secondary | ICD-10-CM | POA: Diagnosis not present

## 2020-11-10 DIAGNOSIS — I129 Hypertensive chronic kidney disease with stage 1 through stage 4 chronic kidney disease, or unspecified chronic kidney disease: Secondary | ICD-10-CM | POA: Insufficient documentation

## 2020-11-10 DIAGNOSIS — N189 Chronic kidney disease, unspecified: Secondary | ICD-10-CM | POA: Insufficient documentation

## 2020-11-10 DIAGNOSIS — J449 Chronic obstructive pulmonary disease, unspecified: Secondary | ICD-10-CM | POA: Insufficient documentation

## 2020-11-10 DIAGNOSIS — Z7901 Long term (current) use of anticoagulants: Secondary | ICD-10-CM | POA: Diagnosis not present

## 2020-11-10 DIAGNOSIS — I714 Abdominal aortic aneurysm, without rupture: Secondary | ICD-10-CM | POA: Insufficient documentation

## 2020-11-10 DIAGNOSIS — N135 Crossing vessel and stricture of ureter without hydronephrosis: Secondary | ICD-10-CM | POA: Diagnosis not present

## 2020-11-10 DIAGNOSIS — Z79899 Other long term (current) drug therapy: Secondary | ICD-10-CM | POA: Insufficient documentation

## 2020-11-10 HISTORY — DX: Dyspnea, unspecified: R06.00

## 2020-11-10 LAB — CBC
HCT: 45 % (ref 39.0–52.0)
Hemoglobin: 14.5 g/dL (ref 13.0–17.0)
MCH: 31.3 pg (ref 26.0–34.0)
MCHC: 32.2 g/dL (ref 30.0–36.0)
MCV: 97.2 fL (ref 80.0–100.0)
Platelets: 257 10*3/uL (ref 150–400)
RBC: 4.63 MIL/uL (ref 4.22–5.81)
RDW: 13.7 % (ref 11.5–15.5)
WBC: 6.5 10*3/uL (ref 4.0–10.5)
nRBC: 0 % (ref 0.0–0.2)

## 2020-11-10 LAB — COMPREHENSIVE METABOLIC PANEL
ALT: 8 U/L (ref 0–44)
AST: 14 U/L — ABNORMAL LOW (ref 15–41)
Albumin: 3.9 g/dL (ref 3.5–5.0)
Alkaline Phosphatase: 79 U/L (ref 38–126)
Anion gap: 9 (ref 5–15)
BUN: 20 mg/dL (ref 8–23)
CO2: 31 mmol/L (ref 22–32)
Calcium: 9.7 mg/dL (ref 8.9–10.3)
Chloride: 101 mmol/L (ref 98–111)
Creatinine, Ser: 1.69 mg/dL — ABNORMAL HIGH (ref 0.61–1.24)
GFR, Estimated: 42 mL/min — ABNORMAL LOW (ref >60)
Glucose, Bld: 92 mg/dL (ref 70–99)
Potassium: 3.6 mmol/L (ref 3.5–5.1)
Sodium: 141 mmol/L (ref 135–145)
Total Bilirubin: 0.3 mg/dL (ref 0.3–1.2)
Total Protein: 7.3 g/dL (ref 6.5–8.1)

## 2020-11-11 LAB — URINE CULTURE: Culture: NO GROWTH

## 2020-11-13 ENCOUNTER — Ambulatory Visit: Payer: Medicare PPO | Admitting: Internal Medicine

## 2020-11-17 ENCOUNTER — Other Ambulatory Visit (HOSPITAL_COMMUNITY)
Admission: RE | Admit: 2020-11-17 | Discharge: 2020-11-17 | Disposition: A | Discharge status: Home / Self Care | Payer: Medicare PPO | Source: Ambulatory Visit | Attending: Urology | Admitting: Urology

## 2020-11-17 DIAGNOSIS — Z01812 Encounter for preprocedural laboratory examination: Secondary | ICD-10-CM | POA: Diagnosis not present

## 2020-11-17 DIAGNOSIS — Z20822 Contact with and (suspected) exposure to covid-19: Secondary | ICD-10-CM | POA: Insufficient documentation

## 2020-11-17 LAB — SARS CORONAVIRUS 2 (TAT 6-24 HRS): SARS Coronavirus 2: NEGATIVE

## 2020-11-17 NOTE — Progress Notes (Signed)
Anesthesia Chart Review   Case: [id number] Date/Time: 11/20/20 0700   Procedure: XI ROBOTIC RIGHT ASSISTED PYELOPLASTY (Right )   Anesthesia type: General   Pre-op diagnosis: RIGHT URETERAL PELVIC JUNCTION OBSTRUCTION   Location: [facility]OM 03 / [facility]   Surgeons: Ardis Hughs, MD      DISCUSSION:74 y.o. current every day smoker (76.5 pack years) with h/o GERD, HTN, COPD, CAD (PCI in 1994), CKD, AAA, right ureteral pelvic junction obstruction scheduled for above procedure 11/20/2020 with Dr. Louis Meckel.   Per cardiology preoperative risk assessment 10/20/2020, "Chart reviewed as part of pre-operative protocol coverage. Given past medical history and time since last visit, based on ACC/AHA guidelines, Dean [name]. would be at acceptable risk for the planned procedure without further cardiovascular testing. Recent nuclear stress test obtained on 10/15/2020 was normal.   The patient was advised that if he develops new symptoms prior to surgery to contact our office to arrange for a follow-up visit, and he verbalized understanding."  Anticipate pt can proceed with planned procedure barring acute status change.   VS: BP (!) 159/80   Pulse 68   Temp 36.9 C (Oral)   Resp 16   Ht 5\' 4"  (1.626 m)   Wt 71.8 kg   SpO2 96%   BMI 27.15 kg/m   PROVIDERS: [name], MD is PCP   Lyman Bishop, MD is Cardiologist  LABS: Labs reviewed: Acceptable for surgery. (all labs ordered are listed, but only abnormal results are displayed)  Labs Reviewed  COMPREHENSIVE METABOLIC PANEL - Abnormal; Notable for the following components:      Result Value   Creatinine, Ser 1.69 (*)    AST 14 (*)    GFR, Estimated 42 (*)    All other components within normal limits  URINE CULTURE  CBC  TYPE AND SCREEN     IMAGES:   EKG: 08/14/20 Rate 41 bpm  Marked sinus bradycardia Rightward axis  Nonspecific ST and T wave abnormality   CV: Myocardial Perfusion 10/15/20  The left  ventricular ejection fraction is normal (55-65%).  Nuclear stress EF: 59%.  Baseline ECG with sub-mm ST depression in the inferior leads that mildly worsened with stress.  There is diaphragmatic attenuation, however, myocardial perfusion appears normal.  This is a low risk study.  Echo 08/23/2019 IMPRESSIONS    1. The left ventricle has hyperdynamic systolic function, with an  ejection fraction of >65%. The cavity size was normal. There is mild  concentric left ventricular hypertrophy. Left ventricular diastolic  Doppler parameters are consistent with impaired  relaxation.  2. The right ventricle has normal systolic function. The cavity was  normal. There is no increase in right ventricular wall thickness. Right  ventricular systolic pressure could not be assessed.  3. The aortic valve is tricuspid. Mild thickening of the aortic valve.  Mild calcification of the aortic valve.  4. The aorta is normal unless otherwise noted. Past Medical History:  Diagnosis Date  . AAA (abdominal aortic aneurysm) (H[facility])    infrarenal aaa 3.1 x 2.8 per abd/pelvis ct 02-26-20 epic  . Allergy    pollen  . Anginal pain ([place]) 2005  . Arthritis    hips, back, neck   . Asthma   . Atrial fibrillation ([place])    no documentation in epic/ pt states has never been told has A Fib  . CAD (coronary artery disease)    s/p angioplasty and stenting x3 1994, pt states has this done  .  Cataract    small, mild  . Chronic kidney disease    kidney cyst  . Complication of anesthesia    woke up during gall bladder sx yrs ago and with colonscopy  . Complication of anesthesia    pt thinks stopped breathing after gallbladder sx years ago, no record found  . COPD (chronic obstructive pulmonary disease) ([place])   . Dyspnea   . Dysrhythmia    regular irregular heart beat  . GERD (gastroesophageal reflux disease)    hx of  . History of hiatal hernia   . History of kidney stones   . History of prediabetes    none  in last 8 months since 40 lb weight loss  . Hyperlipidemia   . Hypertension   . Lower extremity edema since 02-14-2020   swelling goes down when sleeps resolved 06-29-20  . Neuromuscular disorder ([place])    nerve issues with back   . Peripheral vascular disease ([place])   . PVC's (premature ventricular contractions)    pt denies  . Sleep apnea    wears cpap auto set 5 to 20  . Tuberculosis 1966    Past Surgical History:  Procedure Laterality Date  . APPENDECTOMY    . CHOLECYSTECTOMY  1990s   Gall Bladder  . COLONOSCOPY    . CORONARY ANGIOPLASTY WITH STENT PLACEMENT  1994  . CYSTOSCOPY WITH INSERTION OF UROLIFT N/A 04/14/2020   Procedure: CYSTOSCOPY WITH INSERTION OF UROLIFT;  Surgeon: Festus Aloe, MD;  Location: [facility];  Service: Urology;  Laterality: N/A;  . CYSTOSCOPY WITH STENT PLACEMENT Right 07/07/2020   Procedure: CYSTOSCOPY WITH STENT CHANGE;  Surgeon: Festus Aloe, MD;  Location: [facility];  Service: Urology;  Laterality: Right;  . CYSTOSCOPY WITH URETEROSCOPY AND STENT PLACEMENT Bilateral 01/03/2020   Procedure: CYSTOSCOPY WITH RETROGRADE/ URETEROSCOPY AND RIGHT STENT PLACEMENT BLADDER BIOPSY;  Surgeon: Festus Aloe, MD;  Location: [facility];  Service: Urology;  Laterality: Bilateral;  ONLY NEEDS 30 MIN  . CYSTOSCOPY WITH URETEROSCOPY AND STENT PLACEMENT Right 04/14/2020   Procedure: CYSTOSCOPY WITH URETEROSCOPY, RETROGRADE  AND STENT REPLACEMENT;  Surgeon: Festus Aloe, MD;  Location: [facility];  Service: Urology;  Laterality: Right;  . EYE SURGERY Bilateral 2019   cataracts  . lumbar disectomy  2005   L3 - 4 - 5  . POLYPECTOMY    . PR VEIN BYPASS GRAFT,AORTO-FEM-POP  08/31/11   Right to Left Fem-Fem  . RENAL ANGIOGRAPHY N/A 02/14/2020   Procedure: RENAL ANGIOGRAPHY;  Surgeon: Elam Dutch, MD;  Location: [facility];  Service: Cardiovascular;  Laterality: N/A;  Bilateral  .  TONSILLECTOMY  as child  . TONSILLECTOMY    . TOTAL HIP ARTHROPLASTY Right 07/15/2020   Procedure: TOTAL HIP ARTHROPLASTY ANTERIOR APPROACH;  Surgeon: Gaynelle Arabian, MD;  Location: [facility];  Service: Orthopedics;  Laterality: Right;  111min  . UMBILICAL HERNIA REPAIR     x 2    MEDICATIONS: . albuterol (PROVENTIL HFA) 108 (90 Base) MCG/ACT inhaler  . albuterol (PROVENTIL) (2.5 MG/3ML) 0.083% nebulizer solution  . antiseptic oral rinse (BIOTENE) LIQD  . antiseptic oral rinse (BIOTENE) LIQD  . aspirin 325 MG tablet  . atorvastatin (LIPITOR) 10 MG tablet  . Azelastine-Fluticasone 137-50 MCG/ACT SUSP  . Budeson-Glycopyrrol-Formoterol (BREZTRI AEROSPHERE) 160-9-4.8 MCG/ACT AERO  . Cholecalciferol (VITAMIN D-3) 25 MCG (1000 UT) CAPS  . diltiazem (TIAZAC) 120 MG 24 hr capsule  . diltiazem (TIAZAC) 240 MG 24 hr capsule  .  docusate sodium (COLACE) 100 MG capsule  . esomeprazole (NEXIUM) 40 MG capsule  . finasteride (PROSCAR) 5 MG tablet  . irbesartan (AVAPRO) 150 MG tablet  . isosorbide mononitrate (IMDUR) 60 MG 24 hr tablet  . loperamide (IMODIUM) 2 MG capsule  . magnesium oxide (MAG-OX) 400 MG tablet  . Menthol, Topical Analgesic, (BLUE-EMU MAXIMUM STRENGTH EX)  . methocarbamol (ROBAXIN) 500 MG tablet  . Polyethyl Glycol-Propyl Glycol (SYSTANE ULTRA OP)  . pseudoephedrine-acetaminophen (TYLENOL SINUS) 30-500 MG TABS tablet  . rivaroxaban (XARELTO) 10 MG TABS tablet  . simethicone (MYLICON) 167 MG chewable tablet  . traMADol (ULTRAM) 50 MG tablet  . Vitamin D, Ergocalciferol, (DRISDOL) 1.25 MG (50000 UNIT) CAPS capsule   No current facility-administered medications for this encounter.     Dean Felix, PA-C [facility] [phone]

## 2020-11-17 NOTE — Anesthesia Preprocedure Evaluation (Addendum)
Anesthesia Evaluation  Patient identified by MRN, date of birth, ID band Patient awake    Reviewed: Allergy & Precautions, NPO status , Patient's Chart, lab work & pertinent test results  Airway Mallampati: II  TM Distance: >3 FB Neck ROM: Full    Dental  (+) Teeth Intact, Dental Advisory Given   Pulmonary Current Smoker,    breath sounds clear to auscultation       Cardiovascular hypertension,  Rhythm:Regular Rate:Normal     Neuro/Psych    GI/Hepatic   Endo/Other    Renal/GU      Musculoskeletal   Abdominal   Peds  Hematology   Anesthesia Other Findings   Reproductive/Obstetrics                            Anesthesia Physical Anesthesia Plan  ASA: III  Anesthesia Plan: General   Post-op Pain Management:    Induction: Intravenous  PONV Risk Score and Plan: Ondansetron and Dexamethasone  Airway Management Planned: Oral ETT  Additional Equipment:   Intra-op Plan:   Post-operative Plan: Extubation in OR  Informed Consent: I have reviewed the patients History and Physical, chart, labs and discussed the procedure including the risks, benefits and alternatives for the proposed anesthesia with the patient or authorized representative who has indicated his/her understanding and acceptance.     Dental advisory given  Plan Discussed with: CRNA and Anesthesiologist  Anesthesia Plan Comments: (See PAT note 11/10/20, Konrad Felix, PA-C)       Anesthesia Quick Evaluation

## 2020-11-20 ENCOUNTER — Inpatient Hospital Stay (HOSPITAL_COMMUNITY)
Admission: RE | Admit: 2020-11-20 | Discharge: 2020-11-24 | DRG: 660 | Disposition: A | Discharge status: Home Health | Payer: Medicare PPO | Attending: Urology | Admitting: Urology

## 2020-11-20 ENCOUNTER — Encounter (HOSPITAL_COMMUNITY)
Admission: RE | Disposition: A | Discharge status: Home Health | Payer: Self-pay | Source: Home / Self Care | Attending: Urology

## 2020-11-20 ENCOUNTER — Other Ambulatory Visit: Payer: Self-pay

## 2020-11-20 ENCOUNTER — Encounter (HOSPITAL_COMMUNITY): Payer: Self-pay | Admitting: Urology

## 2020-11-20 ENCOUNTER — Ambulatory Visit (HOSPITAL_COMMUNITY): Payer: Medicare PPO | Admitting: Anesthesiology

## 2020-11-20 ENCOUNTER — Ambulatory Visit (HOSPITAL_COMMUNITY): Payer: Medicare PPO | Admitting: Physician Assistant

## 2020-11-20 DIAGNOSIS — R059 Cough, unspecified: Secondary | ICD-10-CM

## 2020-11-20 DIAGNOSIS — R338 Other retention of urine: Secondary | ICD-10-CM | POA: Diagnosis present

## 2020-11-20 DIAGNOSIS — Z8051 Family history of malignant neoplasm of kidney: Secondary | ICD-10-CM | POA: Diagnosis not present

## 2020-11-20 DIAGNOSIS — N401 Enlarged prostate with lower urinary tract symptoms: Secondary | ICD-10-CM | POA: Diagnosis present

## 2020-11-20 DIAGNOSIS — Z9049 Acquired absence of other specified parts of digestive tract: Secondary | ICD-10-CM | POA: Diagnosis not present

## 2020-11-20 DIAGNOSIS — Z9989 Dependence on other enabling machines and devices: Secondary | ICD-10-CM | POA: Diagnosis not present

## 2020-11-20 DIAGNOSIS — I2510 Atherosclerotic heart disease of native coronary artery without angina pectoris: Secondary | ICD-10-CM | POA: Diagnosis present

## 2020-11-20 DIAGNOSIS — J9811 Atelectasis: Secondary | ICD-10-CM | POA: Diagnosis not present

## 2020-11-20 DIAGNOSIS — Z91013 Allergy to seafood: Secondary | ICD-10-CM | POA: Diagnosis not present

## 2020-11-20 DIAGNOSIS — Z880 Allergy status to penicillin: Secondary | ICD-10-CM

## 2020-11-20 DIAGNOSIS — N135 Crossing vessel and stricture of ureter without hydronephrosis: Secondary | ICD-10-CM | POA: Diagnosis not present

## 2020-11-20 DIAGNOSIS — N131 Hydronephrosis with ureteral stricture, not elsewhere classified: Principal | ICD-10-CM | POA: Diagnosis present

## 2020-11-20 DIAGNOSIS — E785 Hyperlipidemia, unspecified: Secondary | ICD-10-CM | POA: Diagnosis not present

## 2020-11-20 DIAGNOSIS — J449 Chronic obstructive pulmonary disease, unspecified: Secondary | ICD-10-CM | POA: Diagnosis present

## 2020-11-20 DIAGNOSIS — F17210 Nicotine dependence, cigarettes, uncomplicated: Secondary | ICD-10-CM | POA: Diagnosis present

## 2020-11-20 DIAGNOSIS — J984 Other disorders of lung: Secondary | ICD-10-CM | POA: Diagnosis not present

## 2020-11-20 DIAGNOSIS — G4733 Obstructive sleep apnea (adult) (pediatric): Secondary | ICD-10-CM | POA: Diagnosis not present

## 2020-11-20 DIAGNOSIS — Z79899 Other long term (current) drug therapy: Secondary | ICD-10-CM

## 2020-11-20 DIAGNOSIS — R0902 Hypoxemia: Secondary | ICD-10-CM | POA: Diagnosis not present

## 2020-11-20 DIAGNOSIS — N130 Hydronephrosis with ureteropelvic junction obstruction: Secondary | ICD-10-CM | POA: Diagnosis not present

## 2020-11-20 HISTORY — PX: ROBOT ASSISTED PYELOPLASTY: SHX5143

## 2020-11-20 LAB — HEMOGLOBIN AND HEMATOCRIT, BLOOD
HCT: 41.7 % (ref 39.0–52.0)
Hemoglobin: 13.4 g/dL (ref 13.0–17.0)

## 2020-11-20 LAB — BASIC METABOLIC PANEL
Anion gap: 11 (ref 5–15)
BUN: 21 mg/dL (ref 8–23)
CO2: 29 mmol/L (ref 22–32)
Calcium: 9.2 mg/dL (ref 8.9–10.3)
Chloride: 100 mmol/L (ref 98–111)
Creatinine, Ser: 1.55 mg/dL — ABNORMAL HIGH (ref 0.61–1.24)
GFR, Estimated: 47 mL/min — ABNORMAL LOW (ref >60)
Glucose, Bld: 149 mg/dL — ABNORMAL HIGH (ref 70–99)
Potassium: 3.6 mmol/L (ref 3.5–5.1)
Sodium: 140 mmol/L (ref 135–145)

## 2020-11-20 LAB — TYPE AND SCREEN
ABO/RH(D): A POS
Antibody Screen: NEGATIVE

## 2020-11-20 SURGERY — PYELOPLASTY, ROBOT-ASSISTED
Anesthesia: General | Laterality: Right

## 2020-11-20 MED ORDER — FENTANYL CITRATE (PF) 100 MCG/2ML IJ SOLN
INTRAMUSCULAR | Status: AC
  Administered 2020-11-20: 25 ug via INTRAVENOUS
  Filled 2020-11-20: qty 2

## 2020-11-20 MED ORDER — CHLORHEXIDINE GLUCONATE CLOTH 2 % EX PADS
6.0000 | MEDICATED_PAD | Freq: Every day | CUTANEOUS | Status: DC
  Administered 2020-11-20 – 2020-11-23 (×4): 6 via TOPICAL

## 2020-11-20 MED ORDER — DEXTROSE-NACL 5-0.45 % IV SOLN: INTRAVENOUS | Status: DC

## 2020-11-20 MED ORDER — ROCURONIUM BROMIDE 10 MG/ML (PF) SYRINGE
PREFILLED_SYRINGE | INTRAVENOUS | Status: DC | PRN
  Administered 2020-11-20: 10 mg via INTRAVENOUS
  Administered 2020-11-20: 70 mg via INTRAVENOUS
  Administered 2020-11-20: 20 mg via INTRAVENOUS

## 2020-11-20 MED ORDER — PHENYLEPHRINE 40 MCG/ML (10ML) SYRINGE FOR IV PUSH (FOR BLOOD PRESSURE SUPPORT)
PREFILLED_SYRINGE | INTRAVENOUS | Status: AC
  Filled 2020-11-20: qty 10

## 2020-11-20 MED ORDER — ISOSORBIDE MONONITRATE ER 60 MG PO TB24
60.0000 mg | ORAL_TABLET | Freq: Every evening | ORAL | Status: DC
  Administered 2020-11-20 – 2020-11-23 (×4): 60 mg via ORAL
  Filled 2020-11-20 (×4): qty 1

## 2020-11-20 MED ORDER — ONDANSETRON HCL 4 MG/2ML IJ SOLN: 4.0000 mg | Freq: Once | INTRAMUSCULAR | Status: DC | PRN

## 2020-11-20 MED ORDER — BUPIVACAINE-EPINEPHRINE (PF) 0.5% -1:200000 IJ SOLN
INTRAMUSCULAR | Status: AC
  Filled 2020-11-20: qty 30

## 2020-11-20 MED ORDER — TRAMADOL HCL 50 MG PO TABS
50.0000 mg | ORAL_TABLET | Freq: Four times a day (QID) | ORAL | Status: DC | PRN
  Administered 2020-11-22: 50 mg via ORAL
  Filled 2020-11-20: qty 1

## 2020-11-20 MED ORDER — ONDANSETRON HCL 4 MG/2ML IJ SOLN: 4.0000 mg | INTRAMUSCULAR | Status: DC | PRN

## 2020-11-20 MED ORDER — SUGAMMADEX SODIUM 200 MG/2ML IV SOLN
INTRAVENOUS | Status: DC | PRN
  Administered 2020-11-20: 200 mg via INTRAVENOUS

## 2020-11-20 MED ORDER — GENTAMICIN SULFATE 40 MG/ML IJ SOLN
320.0000 mg | Freq: Once | INTRAVENOUS | Status: AC
  Administered 2020-11-20: 320 mg via INTRAVENOUS
  Filled 2020-11-20: qty 8

## 2020-11-20 MED ORDER — ALBUTEROL SULFATE HFA 108 (90 BASE) MCG/ACT IN AERS
1.0000 | INHALATION_SPRAY | Freq: Every day | RESPIRATORY_TRACT | Status: DC
  Administered 2020-11-21 – 2020-11-22 (×2): 1 via RESPIRATORY_TRACT
  Filled 2020-11-20: qty 6.7

## 2020-11-20 MED ORDER — SULFAMETHOXAZOLE-TRIMETHOPRIM 800-160 MG PO TABS: 1.0000 | ORAL_TABLET | Freq: Two times a day (BID) | ORAL | 0 refills | Status: DC

## 2020-11-20 MED ORDER — ACETAMINOPHEN 10 MG/ML IV SOLN
1000.0000 mg | Freq: Four times a day (QID) | INTRAVENOUS | Status: AC
  Administered 2020-11-20 – 2020-11-21 (×4): 1000 mg via INTRAVENOUS
  Filled 2020-11-20 (×3): qty 100

## 2020-11-20 MED ORDER — ONDANSETRON HCL 4 MG/2ML IJ SOLN
INTRAMUSCULAR | Status: AC
  Filled 2020-11-20: qty 2

## 2020-11-20 MED ORDER — IRBESARTAN 150 MG PO TABS
150.0000 mg | ORAL_TABLET | Freq: Every evening | ORAL | Status: DC
  Administered 2020-11-20 – 2020-11-23 (×4): 150 mg via ORAL
  Filled 2020-11-20 (×4): qty 1

## 2020-11-20 MED ORDER — DEXAMETHASONE SODIUM PHOSPHATE 10 MG/ML IJ SOLN
INTRAMUSCULAR | Status: AC
  Filled 2020-11-20: qty 1

## 2020-11-20 MED ORDER — MIDAZOLAM HCL 2 MG/2ML IJ SOLN
INTRAMUSCULAR | Status: AC
  Filled 2020-11-20: qty 2

## 2020-11-20 MED ORDER — OXYCODONE HCL 5 MG PO TABS
5.0000 mg | ORAL_TABLET | ORAL | Status: DC | PRN
  Administered 2020-11-20 – 2020-11-22 (×5): 5 mg via ORAL
  Filled 2020-11-20 (×5): qty 1

## 2020-11-20 MED ORDER — LACTATED RINGERS IR SOLN
Status: DC | PRN
  Administered 2020-11-20: 1000 mL

## 2020-11-20 MED ORDER — BUPIVACAINE LIPOSOME 1.3 % IJ SUSP
20.0000 mL | Freq: Once | INTRAMUSCULAR | Status: AC
  Administered 2020-11-20: 20 mL
  Filled 2020-11-20: qty 20

## 2020-11-20 MED ORDER — TRAMADOL HCL 50 MG PO TABS
50.0000 mg | ORAL_TABLET | Freq: Four times a day (QID) | ORAL | 0 refills | Status: DC | PRN
Start: 2020-11-20 — End: 2022-03-12

## 2020-11-20 MED ORDER — DOCUSATE SODIUM 100 MG PO CAPS
100.0000 mg | ORAL_CAPSULE | Freq: Every day | ORAL | Status: DC
  Administered 2020-11-20 – 2020-11-24 (×5): 100 mg via ORAL
  Filled 2020-11-20 (×5): qty 1

## 2020-11-20 MED ORDER — LIDOCAINE HCL (PF) 2 % IJ SOLN
INTRAMUSCULAR | Status: AC
  Filled 2020-11-20: qty 5

## 2020-11-20 MED ORDER — WATER FOR IRRIGATION, STERILE IR SOLN
Status: DC | PRN
  Administered 2020-11-20: 1000 mL

## 2020-11-20 MED ORDER — PROPOFOL 10 MG/ML IV BOLUS
INTRAVENOUS | Status: AC
  Filled 2020-11-20: qty 40

## 2020-11-20 MED ORDER — ONDANSETRON HCL 4 MG/2ML IJ SOLN
INTRAMUSCULAR | Status: DC | PRN
  Administered 2020-11-20: 4 mg via INTRAVENOUS

## 2020-11-20 MED ORDER — ROCURONIUM BROMIDE 10 MG/ML (PF) SYRINGE
PREFILLED_SYRINGE | INTRAVENOUS | Status: AC
  Filled 2020-11-20: qty 10

## 2020-11-20 MED ORDER — FENTANYL CITRATE (PF) 100 MCG/2ML IJ SOLN: 25.0000 ug | INTRAMUSCULAR | Status: DC | PRN

## 2020-11-20 MED ORDER — BUPIVACAINE-EPINEPHRINE 0.5% -1:200000 IJ SOLN
INTRAMUSCULAR | Status: DC | PRN
  Administered 2020-11-20: 11 mL

## 2020-11-20 MED ORDER — PHENYLEPHRINE 40 MCG/ML (10ML) SYRINGE FOR IV PUSH (FOR BLOOD PRESSURE SUPPORT)
PREFILLED_SYRINGE | INTRAVENOUS | Status: DC | PRN
  Administered 2020-11-20 (×2): 80 ug via INTRAVENOUS

## 2020-11-20 MED ORDER — CEFAZOLIN SODIUM-DEXTROSE 2-4 GM/100ML-% IV SOLN
2.0000 g | Freq: Once | INTRAVENOUS | Status: AC
  Administered 2020-11-20: 2 g via INTRAVENOUS

## 2020-11-20 MED ORDER — LACTATED RINGERS IV SOLN: INTRAVENOUS | Status: DC

## 2020-11-20 MED ORDER — DEXAMETHASONE SODIUM PHOSPHATE 10 MG/ML IJ SOLN
INTRAMUSCULAR | Status: DC | PRN
  Administered 2020-11-20: 8 mg via INTRAVENOUS

## 2020-11-20 MED ORDER — LIDOCAINE HCL (CARDIAC) PF 100 MG/5ML IV SOSY
PREFILLED_SYRINGE | INTRAVENOUS | Status: DC | PRN
  Administered 2020-11-20: 80 mg via INTRAVENOUS

## 2020-11-20 MED ORDER — HYDROMORPHONE HCL 1 MG/ML IJ SOLN
0.5000 mg | INTRAMUSCULAR | Status: DC | PRN
  Administered 2020-11-21 – 2020-11-23 (×6): 1 mg via INTRAVENOUS
  Filled 2020-11-20 (×6): qty 1

## 2020-11-20 MED ORDER — GENTAMICIN SULFATE 40 MG/ML IJ SOLN
320.0000 mg | INTRAVENOUS | Status: DC
  Filled 2020-11-20 (×2): qty 8

## 2020-11-20 MED ORDER — CEFAZOLIN SODIUM-DEXTROSE 2-4 GM/100ML-% IV SOLN
INTRAVENOUS | Status: AC
  Filled 2020-11-20: qty 100

## 2020-11-20 MED ORDER — FINASTERIDE 5 MG PO TABS
5.0000 mg | ORAL_TABLET | Freq: Every day | ORAL | Status: DC
  Administered 2020-11-20 – 2020-11-23 (×4): 5 mg via ORAL
  Filled 2020-11-20 (×4): qty 1

## 2020-11-20 MED ORDER — MIDAZOLAM HCL 2 MG/2ML IJ SOLN
INTRAMUSCULAR | Status: DC | PRN
  Administered 2020-11-20: 1 mg via INTRAVENOUS

## 2020-11-20 MED ORDER — CHLORHEXIDINE GLUCONATE 0.12 % MT SOLN
15.0000 mL | Freq: Once | OROMUCOSAL | Status: AC
  Administered 2020-11-20: 15 mL via OROMUCOSAL

## 2020-11-20 MED ORDER — FENTANYL CITRATE (PF) 250 MCG/5ML IJ SOLN
INTRAMUSCULAR | Status: AC
  Filled 2020-11-20: qty 5

## 2020-11-20 MED ORDER — SODIUM CHLORIDE (PF) 0.9 % IJ SOLN
INTRAMUSCULAR | Status: AC
  Filled 2020-11-20: qty 20

## 2020-11-20 MED ORDER — METHYLENE BLUE 0.5 % INJ SOLN
INTRAVENOUS | Status: AC
  Filled 2020-11-20: qty 10

## 2020-11-20 MED ORDER — PROPOFOL 10 MG/ML IV BOLUS
INTRAVENOUS | Status: DC | PRN
  Administered 2020-11-20: 140 mg via INTRAVENOUS

## 2020-11-20 MED ORDER — FENTANYL CITRATE (PF) 250 MCG/5ML IJ SOLN
INTRAMUSCULAR | Status: DC | PRN
  Administered 2020-11-20: 50 ug via INTRAVENOUS
  Administered 2020-11-20 (×2): 100 ug via INTRAVENOUS

## 2020-11-20 MED ORDER — ORAL CARE MOUTH RINSE: 15.0000 mL | Freq: Once | OROMUCOSAL | Status: AC

## 2020-11-20 SURGICAL SUPPLY — 62 items
ADH SKN CLS APL DERMABOND .7 (GAUZE/BANDAGES/DRESSINGS) ×1
APL PRP STRL LF DISP 70% ISPRP (MISCELLANEOUS) ×1
CATH FOLEY 3WAY  5CC 16FR (CATHETERS) ×3
CATH FOLEY 3WAY 5CC 16FR (CATHETERS) ×1 IMPLANT
CHLORAPREP W/TINT 26 (MISCELLANEOUS) ×3 IMPLANT
CLIP VESOLOCK LG 6/CT PURPLE (CLIP) IMPLANT
CLIP VESOLOCK MED LG 6/CT (CLIP) ×2 IMPLANT
COVER SURGICAL LIGHT HANDLE (MISCELLANEOUS) ×3 IMPLANT
COVER TIP SHEARS 8 DVNC (MISCELLANEOUS) ×1 IMPLANT
COVER TIP SHEARS 8MM DA VINCI (MISCELLANEOUS) ×3
COVER WAND RF STERILE (DRAPES) IMPLANT
DECANTER SPIKE VIAL GLASS SM (MISCELLANEOUS) ×3 IMPLANT
DERMABOND ADVANCED (GAUZE/BANDAGES/DRESSINGS) ×2
DERMABOND ADVANCED .7 DNX12 (GAUZE/BANDAGES/DRESSINGS) IMPLANT
DRAIN CHANNEL 15F RND FF 3/16 (WOUND CARE) ×3 IMPLANT
DRAPE ARM DVNC X/XI (DISPOSABLE) ×4 IMPLANT
DRAPE COLUMN DVNC XI (DISPOSABLE) ×1 IMPLANT
DRAPE DA VINCI XI ARM (DISPOSABLE) ×12
DRAPE DA VINCI XI COLUMN (DISPOSABLE) ×3
DRAPE INCISE IOBAN 66X45 STRL (DRAPES) ×3 IMPLANT
DRAPE SHEET LG 3/4 BI-LAMINATE (DRAPES) ×5 IMPLANT
DRSG TEGADERM 4X4.75 (GAUZE/BANDAGES/DRESSINGS) ×4 IMPLANT
ELECT PENCIL ROCKER SW 15FT (MISCELLANEOUS) ×2 IMPLANT
ELECT REM PT RETURN 15FT ADLT (MISCELLANEOUS) ×3 IMPLANT
EVACUATOR SILICONE 100CC (DRAIN) ×3 IMPLANT
GLOVE BIO SURGEON STRL SZ 6.5 (GLOVE) ×2 IMPLANT
GLOVE BIO SURGEONS STRL SZ 6.5 (GLOVE) ×1
GLOVE BIOGEL M STRL SZ7.5 (GLOVE) ×7 IMPLANT
GOWN STRL REUS W/TWL LRG LVL3 (GOWN DISPOSABLE) ×12 IMPLANT
GUIDEWIRE STR DUAL SENSOR (WIRE) ×2 IMPLANT
IRRIG SUCT STRYKERFLOW 2 WTIP (MISCELLANEOUS) ×3
IRRIGATION SUCT STRKRFLW 2 WTP (MISCELLANEOUS) IMPLANT
KIT BASIN OR (CUSTOM PROCEDURE TRAY) ×3 IMPLANT
KIT TURNOVER KIT A (KITS) IMPLANT
LOOP VESSEL MAXI BLUE (MISCELLANEOUS) ×2 IMPLANT
MARKER SKIN DUAL TIP RULER LAB (MISCELLANEOUS) ×2 IMPLANT
NDL INSUFFLATION 14GA 120MM (NEEDLE) ×1 IMPLANT
NEEDLE INSUFFLATION 14GA 120MM (NEEDLE) ×3 IMPLANT
NS IRRIG 1000ML POUR BTL (IV SOLUTION) ×3 IMPLANT
PAD POSITIONING PINK XL (MISCELLANEOUS) ×2 IMPLANT
PLUG CATH AND CAP STER (CATHETERS) ×2 IMPLANT
PROTECTOR NERVE ULNAR (MISCELLANEOUS) ×6 IMPLANT
SEAL CANN UNIV 5-8 DVNC XI (MISCELLANEOUS) ×4 IMPLANT
SEAL XI 5MM-8MM UNIVERSAL (MISCELLANEOUS) ×12
SET IRRIG Y TYPE TUR BLADDER L (SET/KITS/TRAYS/PACK) ×3 IMPLANT
SET TUBE SMOKE EVAC HIGH FLOW (TUBING) ×2 IMPLANT
SOLUTION ELECTROLUBE (MISCELLANEOUS) ×3 IMPLANT
SPONGE LAP 4X18 RFD (DISPOSABLE) ×3 IMPLANT
STENT URET 6FRX26 CONTOUR (STENTS) ×2 IMPLANT
SUT ETHILON 3 0 PS 1 (SUTURE) ×3 IMPLANT
SUT MON AB 4-0 SH 27 (SUTURE) ×6 IMPLANT
SUT VIC AB 0 CT1 27 (SUTURE) ×3
SUT VIC AB 0 CT1 27XBRD ANTBC (SUTURE) IMPLANT
SUT VIC AB 0 UR5 27 (SUTURE) IMPLANT
SUT VIC AB 4-0 RB1 27 (SUTURE) ×6
SUT VIC AB 4-0 RB1 27XBRD (SUTURE) IMPLANT
SUT VICRYL 0 UR6 27IN ABS (SUTURE) ×3 IMPLANT
TOWEL OR NON WOVEN STRL DISP B (DISPOSABLE) ×3 IMPLANT
TRAY LAPAROSCOPIC (CUSTOM PROCEDURE TRAY) ×3 IMPLANT
TROCAR BLADELESS OPT 5 100 (ENDOMECHANICALS) ×3 IMPLANT
TROCAR XCEL 12X100 BLDLESS (ENDOMECHANICALS) ×2 IMPLANT
WATER STERILE IRR 1000ML POUR (IV SOLUTION) ×3 IMPLANT

## 2020-11-20 NOTE — Op Note (Signed)
Preoperative diagnosis: right ureteropelvic junction obstruction  Postoperative diagnosis: right ureteropelvic junction obstruction  Procedure:  1. right ureteral stent placement  2. right robotic-assisted laparoscopic pyeloplasty  Surgeon: Louis Meckel,  M.D.  Assistant(s): Debbrah Alar, PA-C  Anesthesia: General  Complications: None  EBL: 100 mL   Specimens: 1. none  Intraoperative findings:  #1: The renal pelvis was very inflamed and fibrotic, the redundancy had created adhesions at the UPJ so that it was adherent to back on itself creating an obstruction.  This was taken down and straightened up.  I then incised the renal pelvis down to the UPJ and did not see a stricture.  I did remove some tissue within the pelvis to shorten up make it smaller. #2: A 26 cm time 6 French double-J ureteral stent was passed robotically.  Drains:  1. # 15 Blake perinephric drain 2. 16 Fr Foley catheter  Indication: Dean [name]. is a 74 y.o. patient with a suspected right ureteropelvic junction obstruction.  After a thorough review of the management options for their ureteropelvic junction obstruction, they elected to proceed with surgical treatment and the above procedure.  We have discussed the potential benefits and risks of the procedure, side effects of the proposed treatment, the likelihood of the patient achieving the goals of the procedure, and any potential problems that might occur during the procedure or recuperation. Informed consent has been obtained.  Description of procedure: The patient was taken to the operating room and a general anesthetic was administered. The patient was given preoperative antibiotics, placed in right modified flank position and prepped and draped in the usual sterile fashion. Next a preoperative timeout was performed.   A site was selected on the right side of the umbilicus for placement of the camera port. This was placed using a standard  open Hassan technique which allowed entry into the peritoneal cavity under direct vision and without difficulty. A 8 mm port was placed and a pneumoperitoneum established. The camera was then used to inspect the abdomen and there was no evidence of any intra-abdominal injuries or other abnormalities. The remaining abdominal ports were then placed. 8 mm robotic ports were placed in the ipsilateral upper quadrant, lower quadrant, and far lateral abdominal wall. A 12 mm port was placed in the upper midline for laparoscopic assistance. All ports were placed under direct vision without difficulty. The surgical cart was then docked.   Utilizing the cautery scissors, the white line of Toldt was incised allowing the plane between the mesocolon and the anterior layer of Gerota's fascia to be developed and the kidney exposed.  The ureter and gonadal vein were identified inferiorly and the ureter was lifted anteriorly off the psoas muscle.  Dissection proceeded superiorly along the gonadal vein until the main renal hilum and renal pelvis was identified.  The ureteropelvic junction was isolated from the surrounding structures with a combination of sharp and blunt dissection.  The ureter and renal pelvis were then examined.  A kink within the renal pelvis to the ureter was noted, resulting from dense adhesions.  This was freed up.  I then incised the renal pelvis and extended the incision through the UPJ noting no significant stricture.   Any excess renal pelvis was excised as needed and the renal pelvis and ureter were then spatulated appropriately.  4-0 vicryl sutures were then used to reapproximate the renal pelvis.  With the anterior wall sutured and the start of the posterior wall we placed our stent. A sensor  wire was passed down the ureter and then a 6x26 JJ stent was passed over the wire. At this point we filled the bladder with methylene blue and saline to 350 ml's and had good refulx of fluid into the renal pelvis  with a good curl of the stent in the renal pelvis confirming location in the bladder. We then completed the posterior wall. The anastomosis was performed in a tension-free, watertight fashion with the ureter reconnected to the renal pelvis in a position to provide dependent drainage of the renal collecting system.   A # 15 Blake perinephric drain was then brought through the lateral lower abdominal port site and positioned appropriately.  It was secured to the skin with a nylon suture.  The 12 mm port sites were then closed with 0-vicryl sutures placed laparoscopically with the laparoscopic suture passer. All remaining ports were removed under direct vision after hemostasis was confirmed with the pneumoperiotneum let down. All port sites were injected with 0.25% bupivicaine and reapproximated at the skin level with 4-0 monocryl subcuticular sutures. Dermabond was applied to the skin.  The patient appeared to tolerate the procedure well and without complications.  The patient was able to be extubated and transferred to the recovery unit in satisfactory condition.

## 2020-11-20 NOTE — H&P (Signed)
74 year old male with a host of comorbidities including COPD and congestive heart failure who was being evaluated for hip surgery when he was noted to have a worsening creatinine. A renal ultrasound was performed which demonstrated bilateral cyst and right hydronephrosis. He was subsequently taken to the operating room by Dr. 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 The retrograde pyelogram was performed demonstrating normal filling ureter but no contrast up past the UPJ. Ureteroscopy confirmed a narrowing of the UPJ and a dilated collecting system. A stent was placed at that time and has been exchanged 2 additional times. The last stent was exchanged in July of 2021. The patient does not recall any right associated flank pain.   The patient does have a history of kidney stones. 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 He underwent a uro lift procedure recently and is voiding better.   The patient has limited mobility because of his hip surgery. It seems to be getting better, but he still walks with a walker.   The patient's most recent creatinine clearance was 1.56, it has been as high as 1.9.   Interval: Today the patient presents for preop evaluation for his pending robotic assisted left pyeloplasty. He has continued to gain some strength and conditioning following his hip replacement. He continues to smoke quite significantly. His exercise tolerance is fairly poor. Denies any fevers or chills. He has had some urinary urgency and associated frequency. He has not been treated for an infection.     ALLERGIES: Penicillins    MEDICATIONS: Finasteride 5 mg tablet 1 tablet PO Daily  Atorvastatin Calcium 10 MG Oral Tablet Oral  Diltiazem Er 240 mg capsule,extended-release 24hr degradable Oral  Dymista 137 mcg-50 mcg/spray aerosol, spray with pump Nasal  External Cath 1 device Other Q PM PRN External catheter with catheter drainage system. may  chose size 97mm-35mm.  Hydrocodone-Acetaminophen  Irbesartan 1 PO Daily  Isosorbide Mononitrate ER 60 MG Oral Tablet Extended Release 24 Hour Oral  Methocarbamol  Proventil Hfa 90 mcg hfa aerosol with adapter Inhalation  Stool Softener     GU PSH: Cystoscopy - 2018 Cystoscopy Insert Stent, Right - 07/07/2020, Right - 04/14/2020, Right - 01/03/2020 Cystoscopy TURBT <2 cm - 01/02/2020 Cystoscopy Ureteroscopy, Right - 04/14/2020, Right - 01/03/2020, 2007 UROLIFT - 04/14/2020       PSH Notes: Hernia Repair, Upper Gastrointestinal Endoscopy (Therapeutic), Tonsillectomy, Cystoscopy With Ureteroscopy, Appendectomy, Cholecystectomy   NON-GU PSH: Appendectomy - 2007 Cholecystectomy (open) - 2007 Hernia Repair - 2010 Neuroeltrd Stim Post Tibial - 2020, 2020, 12/14/2018, 12/07/2018, 11/30/2018, 11/23/2018, 11/13/2018, 11/09/2018, 11/02/2018, 10/26/2018, 2019, 2019, 2019, 2019 Remove Tonsils - 2008     GU PMH: Hydronephrosis - 09/17/2020, - 02/18/2020 Oth hydronephrosis - 08/20/2020, Right UPJ obs - his stent was encrusting p 3-4 months and would require frequent changes. Discussed nature r/b/a to pyeloplasty and he is interested. I will refer to Dr. Louis Meckel. , - 08/05/2020, - 05/27/2020, Will plan on stent change in 3 months and then consider referring for pyeloplasty once the hip is completed although discussed the bacteriuria could be an issue as far as clearance. Again, not a clinical UTI, but I sent a cx and consider abx., - 04/29/2020, Looks good on CT scan. Discussed long term stent changes vs RAL-pyeloplasty. He is hoping to get hip replaced. We can change the stent April 2021 and then that's a good time to do the hip with a new / clean  stent in place and having the prostate treated from a GU pt of view. I also told him to be sure to let Dr. Reynaldo Minium or Dr. Debara Pickett know about his LE swelling. , - 03/04/2020, check BN/Cr, will update Dr. Oneida Alar. Stent change/URS Apr 2021. , - 01/20/2020, - 12/02/2019 BPH w/LUTS -  04/29/2020, episode of retention p angiogram. Discussed nature r/b/a to Urolift - we had considered previously. We will plan Urolift when we change the stent. , - 03/04/2020, restart tams with finasteride. Consider Urolift at stent change. , - 01/20/2020, - 12/02/2019, - 11/29/2018, - 2019, - 2018, - 2018, Benign localized prostatic hyperplasia with lower urinary tract symptoms (LUTS), - 2017, Benign prostatic hyperplasia with urinary obstruction, - 2014 Weak Urinary Stream (Stable) - 03/04/2020, (Stable), - 12/02/2019 (Stable), - 2019 (Stable), - 2018, Weak urinary stream, - 2017 Urinary Retention - 02/18/2020 ED due to arterial insufficiency - 11/29/2018, - 2019 Urinary Frequency - 11/29/2018 Urinary Urgency (Stable) - 2019, (Stable), - 2018, Urinary urgency, - 2014 Renal cyst - 2018, - 2018, Renal cyst, acquired, - 2014 Bladder Stone, Bladder calculus - 2014 BPH w/o LUTS, Benign prostatic hypertrophy without lower urinary tract symptoms - 2014 Elevated PSA, PSA,Elevated - 2014 Gross hematuria, Gross Hematuria - 2014 History of urolithiasis, Nephrolithiasis - 2014 Low back pain, Lower back pain - 2014 Obstructive and reflux uropathy, Unspec, Obstructive uropathy - 2014 Other microscopic hematuria, Microscopic Hematuria - 2014 Renal calculus, Kidney stone on left side - 2014 RUQ pain, Abdominal Pain In The Right Upper Belly (RUQ) - 2014      PMH Notes:  2011-06-01 11:31:09 - Note: Neoplasm Of The Prostate Gland   NON-GU PMH: Bacteriuria - 04/29/2020 Encounter for general adult medical examination without abnormal findings, Encounter for preventive health examination - 2016 Arrhythmia, Rhythm Disorder - 2014 Asthma, Asthma - 2014 Personal history of other diseases of the circulatory system, History of hypertension - 2014 Personal history of other diseases of the nervous system and sense organs, History of sleep apnea - 2014    FAMILY HISTORY: Kidney Cancer - Mother   SOCIAL HISTORY: Marital  Status: Single Preferred Language: English; Ethnicity: Not Hispanic Or Latino; Race: White Current Smoking Status: Patient smokes.   Tobacco Use Assessment Completed: Used Tobacco in last 30 days? Drinks 2 caffeinated drinks per day. Patient's occupation is/was retired.     Notes: Current every day smoker, Marital History - Single, Occupation:, Death In The Family Mother, Tobacco Use, Caffeine Use   REVIEW OF SYSTEMS:    GU Review Male:   Patient reports frequent urination, hard to postpone urination, burning/ pain with urination, get up at night to urinate, and leakage of urine. Patient denies stream starts and stops, trouble starting your stream, have to strain to urinate , erection problems, and penile pain.  Gastrointestinal (Upper):   Patient denies indigestion/ heartburn, nausea, and vomiting.  Gastrointestinal (Lower):   Patient reports diarrhea. Patient denies constipation.  Constitutional:   Patient denies fever, night sweats, weight loss, and fatigue.  Skin:   Patient denies skin rash/ lesion and itching.  Eyes:   Patient denies blurred vision and double vision.  Ears/ Nose/ Throat:   Patient denies sore throat and sinus problems.  Hematologic/Lymphatic:   Patient denies swollen glands and easy bruising.  Cardiovascular:   Patient denies leg swelling and chest pains.  Respiratory:   Patient reports shortness of breath. Patient denies cough.  Endocrine:   Patient denies excessive thirst.  Musculoskeletal:   Patient reports joint  pain. Patient denies back pain.  Neurological:   Patient denies headaches and dizziness.  Psychologic:   Patient denies depression and anxiety.   VITAL SIGNS:      10/20/2020 12:13 PM  Weight 144 lb / 65.32 kg  Height 64 in / 162.56 cm  BP 128/52 mmHg  Heart Rate 54 /min  Temperature 98.0 F / 36.6 C  BMI 24.7 kg/m   Complexity of Data:  Source Of History:  Patient  Records Review:   Previous Patient Records  Urine Test Review:   Urinalysis   X-Ray Review: C.T. Abdomen/Pelvis: Reviewed Films. Discussed With Patient.     01/20/20 09/03/18 07/05/17 08/20/15 08/07/14 08/08/13 05/29/12 05/25/11  PSA  Total PSA 1.72 ng/mL 1.12 ng/mL 1.81 ng/mL 1.24  0.92  1.25  0.86  0.98     11/30/18  Hormones  Testosterone, Total 406.7 ng/dL    PROCEDURES:         Flexible Cystoscopy Left Stent Removal - 52310  Risks, benefits, and some of the potential complications of the procedure were discussed at length with the patient including infection, bleeding, voiding discomfort, urinary retention, fever, chills, sepsis, and others. All questions were answered. Informed consent was obtained. Antibiotic prophylaxis was given. Sterile technique and intraurethral analgesia were used.  Meatus:  Normal size. Normal location. Normal condition.  Urethra:  No strictures.  External Sphincter:  Normal.  Verumontanum:  Normal.  Prostate:  Non-obstructing. No hyperplasia.  Bladder Neck:  Non-obstructing.  Ureteral Orifices:  Normal location. Normal size. Normal shape. Effluxed clear urine.  Bladder:  No trabeculation. No tumors. Normal mucosa. No stones.  The left ureteral stent was carefully removed with a grasping instrument.    The lower urinary tract was carefully examined. The procedure was well-tolerated and without complications. Antibiotic instructions were given. Instructions were given to call the office immediately for bloody urine, difficulty urinating, urinary retention, painful or frequent urination, fever, chills, nausea, vomiting or other illness. The patient stated that he understood these instructions and would comply with them.   ASSESSMENT:      ICD-10 Details  1 GU:   Hydronephrosis - N13.0      PLAN:           Schedule Return Visit/Planned Activity: Keep Scheduled Appointment - Schedule Surgery          Document Letter(s):  Created for Patient: Clinical Summary         Notes:   I removed the patient's left ureteral stent in  preparation for his upcoming pyeloplasty. Removing the stent early allows the inflammation to resolve and the collecting system to be more easily identifiable. We then discussed a robotic pyeloplasty in great detail. The patient has been scheduled for December 3rd. I went through the operation with him in detail and discussed the risks and the benefits. We discussed the postoperative care as well. The patient has some reliable neighbors that he will lean on in the recovery phase. I am starting the patient on antibiotics today for the stent pull for one week. He has already been cleared for surgery by Cardiology.

## 2020-11-20 NOTE — Care Management CC44 (Signed)
Condition Code 44 Documentation Completed  Patient Details  Name: Dean [name]. MRN: [id number] Date of Birth: [birth year]   Condition Code 44 given:  Yes Patient signature on Condition Code 44 notice:  Yes Documentation of 2 MD's agreement:  Yes Code 44 added to claim:  Yes    Dessa Phi, RN 11/20/2020, 3:56 PM

## 2020-11-20 NOTE — Interval H&P Note (Signed)
History and Physical Interval Note:  11/20/2020 7:23 AM  [name]  has presented today for surgery, with the diagnosis of RIGHT URETERAL PELVIC JUNCTION OBSTRUCTION.  The various methods of treatment have been discussed with the patient and family. After consideration of risks, benefits and other options for treatment, the patient has consented to  Procedure(s): XI ROBOTIC RIGHT ASSISTED PYELOPLASTY (Right) as a surgical intervention.  The patient's history has been reviewed, patient examined, no change in status, stable for surgery.  I have reviewed the patient's chart and labs.  Questions were answered to the patient's satisfaction.     Dean [name]

## 2020-11-20 NOTE — Transfer of Care (Signed)
Immediate Anesthesia Transfer of Care Note  Patient: Dean [name].  Procedure(s) Performed: XI ROBOTIC RIGHT ASSISTED PYELOPLASTY AND STENT PLACEMENT (Right )  Patient Location: PACU  Anesthesia Type:General  Level of Consciousness: drowsy and patient cooperative  Airway & Oxygen Therapy: Patient Spontanous Breathing and Patient connected to face mask oxygen  Post-op Assessment: Report given to RN and Post -op Vital signs reviewed and stable  Post vital signs: Reviewed and stable  Last Vitals:  Vitals Value Taken Time  BP 139/69 11/20/20 1215  Temp    Pulse 57 11/20/20 1217  Resp 14 11/20/20 1217  SpO2 98 % 11/20/20 1217  Vitals shown include unvalidated device data.  Last Pain:  Vitals:   11/20/20 0556  TempSrc: Oral  PainSc: 0-No pain         Complications: No complications documented.

## 2020-11-20 NOTE — Anesthesia Procedure Notes (Signed)
Procedure Name: Intubation Date/Time: 11/20/2020 7:44 AM Performed by: Raenette Rover, CRNA Pre-anesthesia Checklist: Patient identified, Emergency Drugs available, Suction available and Patient being monitored Patient Re-evaluated:Patient Re-evaluated prior to induction Oxygen Delivery Method: Circle system utilized Preoxygenation: Pre-oxygenation with 100% oxygen Induction Type: IV induction Ventilation: Mask ventilation without difficulty Laryngoscope Size: Miller and 3 Grade View: Grade I Tube type: Oral Tube size: 7.5 mm Number of attempts: 1 Airway Equipment and Method: Stylet Placement Confirmation: ETT inserted through vocal cords under direct vision,  positive ETCO2 and breath sounds checked- equal and bilateral Secured at: 22 cm Tube secured with: Tape Dental Injury: Teeth and Oropharynx as per pre-operative assessment

## 2020-11-20 NOTE — Anesthesia Postprocedure Evaluation (Signed)
Anesthesia Post Note  Patient: Dean [name].  Procedure(s) Performed: XI ROBOTIC RIGHT ASSISTED PYELOPLASTY AND STENT PLACEMENT (Right )     Patient location during evaluation: PACU Anesthesia Type: General Level of consciousness: awake and alert Pain management: pain level controlled Vital Signs Assessment: post-procedure vital signs reviewed and stable Respiratory status: spontaneous breathing, nonlabored ventilation, respiratory function stable and patient connected to nasal cannula oxygen Cardiovascular status: blood pressure returned to baseline and stable Postop Assessment: no apparent nausea or vomiting Anesthetic complications: no   No complications documented.  Last Vitals:  Vitals:   11/20/20 1353 11/20/20 1738  BP: 137/62 (!) 150/80  Pulse: (!) 51 (!) 55  Resp: 18 16  Temp: 36.5 C 36.4 C  SpO2: 94% 98%    Last Pain:  Vitals:   11/20/20 1738  TempSrc: Oral  PainSc:                  Eusebio Blazejewski COKER

## 2020-11-21 ENCOUNTER — Encounter (HOSPITAL_COMMUNITY): Payer: Self-pay | Admitting: Urology

## 2020-11-21 LAB — BASIC METABOLIC PANEL
Anion gap: 10 (ref 5–15)
BUN: 18 mg/dL (ref 8–23)
CO2: 27 mmol/L (ref 22–32)
Calcium: 8.8 mg/dL — ABNORMAL LOW (ref 8.9–10.3)
Chloride: 98 mmol/L (ref 98–111)
Creatinine, Ser: 1.47 mg/dL — ABNORMAL HIGH (ref 0.61–1.24)
GFR, Estimated: 50 mL/min — ABNORMAL LOW (ref >60)
Glucose, Bld: 136 mg/dL — ABNORMAL HIGH (ref 70–99)
Potassium: 4 mmol/L (ref 3.5–5.1)
Sodium: 135 mmol/L (ref 135–145)

## 2020-11-21 LAB — CBC
HCT: 38.2 % — ABNORMAL LOW (ref 39.0–52.0)
Hemoglobin: 12.3 g/dL — ABNORMAL LOW (ref 13.0–17.0)
MCH: 31.6 pg (ref 26.0–34.0)
MCHC: 32.2 g/dL (ref 30.0–36.0)
MCV: 98.2 fL (ref 80.0–100.0)
Platelets: 172 10*3/uL (ref 150–400)
RBC: 3.89 MIL/uL — ABNORMAL LOW (ref 4.22–5.81)
RDW: 13.4 % (ref 11.5–15.5)
WBC: 14.5 10*3/uL — ABNORMAL HIGH (ref 4.0–10.5)
nRBC: 0 % (ref 0.0–0.2)

## 2020-11-21 MED ORDER — PROSOURCE PLUS PO LIQD
30.0000 mL | Freq: Two times a day (BID) | ORAL | Status: DC
  Administered 2020-11-21 – 2020-11-24 (×7): 30 mL via ORAL
  Filled 2020-11-21 (×7): qty 30

## 2020-11-21 MED ORDER — BOOST / RESOURCE BREEZE PO LIQD CUSTOM
1.0000 | Freq: Two times a day (BID) | ORAL | Status: DC
  Administered 2020-11-21 – 2020-11-22 (×3): 1 via ORAL

## 2020-11-21 MED ORDER — ALBUTEROL SULFATE (2.5 MG/3ML) 0.083% IN NEBU: 2.5000 mg | INHALATION_SOLUTION | Freq: Four times a day (QID) | RESPIRATORY_TRACT | Status: DC | PRN

## 2020-11-21 MED ORDER — CALCIUM CARBONATE ANTACID 500 MG PO CHEW
400.0000 mg | CHEWABLE_TABLET | Freq: Every day | ORAL | Status: DC
  Administered 2020-11-22 – 2020-11-24 (×3): 400 mg via ORAL
  Filled 2020-11-21 (×4): qty 2

## 2020-11-21 NOTE — Evaluation (Signed)
Physical Therapy Evaluation Patient Details Name: Dean [name]. MRN: [id number] DOB: 1946/04/15 Today's Date: 11/21/2020   History of Present Illness  74 yo male admitted with UPJ obstruction s/p ureteral stent placement 12/3. hx of COPD, CHF, Afib, PVD, kidney stones, AAA, CAD  Clinical Impression  On eval, pt required Min assist for mobility. He walked ~15 feet before requesting to return to room due to pain, fatigue, and dyspnea. Pt rated abdominal/pelvic pain 8/10-made RN aware. Will plan to follow and progress activity as tolerated. Pt lives alone. At this time, recommendation is for ST SNF rehab, if pt is agreeable.     Follow Up Recommendations SNF    Equipment Recommendations  None recommended by PT    Recommendations for Other Services       Precautions / Restrictions Precautions Precautions: Fall Precaution Comments: R jp drain. Monitor O2 Restrictions Weight Bearing Restrictions: No      Mobility  Bed Mobility Overal bed mobility: Needs Assistance Bed Mobility: Supine to Sit;Sit to Supine     Supine to sit: Min assist;HOB elevated Sit to supine: Min guard;HOB elevated   General bed mobility comments: Pt relied on bedrail. Increased time. Task is effortful and painful    Transfers Overall transfer level: Needs assistance Equipment used: Rolling walker (2 wheeled) Transfers: Sit to/from Stand Sit to Stand: Min guard         General transfer comment: Cues for safety, hand placement instead of pulling up on walker.  Ambulation/Gait Ambulation/Gait assistance: Min assist Gait Distance (Feet): 15 Feet Assistive device: Rolling walker (2 wheeled) Gait Pattern/deviations: Step-through pattern;Decreased stride length     General Gait Details: Slow effortful gait. Assist to steady. O2 90% on 4L. Dyspnea 3/4. Pt fatigues easily. Requested to defer futher ambulation 2* pain, dyspnea, fatigue and return to room.  Stairs            Wheelchair  Mobility    Modified Rankin (Stroke Patients Only)       Balance Overall balance assessment: Needs assistance         Standing balance support: Bilateral upper extremity supported Standing balance-Leahy Scale: Poor                               Pertinent Vitals/Pain Pain Assessment: 0-10 Pain Score: 8  Pain Location: abomen/pelvic area Pain Descriptors / Indicators: Aching;Sore;Discomfort;Guarding;Grimacing Pain Intervention(s): Limited activity within patient's tolerance;Monitored during session;Repositioned;Patient requesting pain meds-RN notified    Home Living Family/patient expects to be discharged to:: Private residence Living Arrangements: Alone Available Help at Discharge: Family;Available PRN/intermittently Type of Home: House Home Access: Stairs to enter Entrance Stairs-Rails: None Entrance Stairs-Number of Steps: 1+1 Home Layout: Two level;Bed/bath upstairs;1/2 bath on main level Home Equipment: Walker - 2 wheels;Cane - single point;Grab bars - tub/shower Additional Comments: has chair lift up to BR    Prior Function Level of Independence: Independent with assistive device(s)         Comments: using RW for 1 year or more. pt is a retired history Pharmacist, hospital.     Hand Dominance   Dominant Hand: Right    Extremity/Trunk Assessment   Upper Extremity Assessment Upper Extremity Assessment: Defer to OT evaluation    Lower Extremity Assessment Lower Extremity Assessment: Generalized weakness    Cervical / Trunk Assessment Cervical / Trunk Assessment: Normal  Communication   Communication: No difficulties  Cognition Arousal/Alertness: Awake/alert Behavior During Therapy: WFL for tasks assessed/performed  Overall Cognitive Status: Within Functional Limits for tasks assessed                                        General Comments      Exercises     Assessment/Plan    PT Assessment Patient needs continued PT services   PT Problem List Decreased strength;Decreased mobility;Decreased activity tolerance;Decreased balance;Decreased knowledge of use of DME;Pain       PT Treatment Interventions DME instruction;Gait training;Therapeutic activities;Therapeutic exercise;Patient/family education;Balance training;Functional mobility training    PT Goals (Current goals can be found in the Care Plan section)  Acute Rehab PT Goals Patient Stated Goal: less pain. get better. home. PT Goal Formulation: With patient Time For Goal Achievement: 12/05/20 Potential to Achieve Goals: Good    Frequency Min 3X/week   Barriers to discharge        Co-evaluation               AM-PAC PT "6 Clicks" Mobility  Outcome Measure Help needed turning from your back to your side while in a flat bed without using bedrails?: A Little Help needed moving from lying on your back to sitting on the side of a flat bed without using bedrails?: A Little Help needed moving to and from a bed to a chair (including a wheelchair)?: A Little Help needed standing up from a chair using your arms (e.g., wheelchair or bedside chair)?: A Little Help needed to walk in hospital room?: A Little Help needed climbing 3-5 steps with a railing? : A Little 6 Click Score: 18    End of Session Equipment Utilized During Treatment: Oxygen Activity Tolerance: Patient limited by fatigue;Patient limited by pain Patient left: in bed;with call bell/phone within reach;with bed alarm set   PT Visit Diagnosis: Muscle weakness (generalized) (M62.81);Unsteadiness on feet (R26.81);Pain    Time: 4920-1007 PT Time Calculation (min) (ACUTE ONLY): 16 min   Charges:   PT Evaluation $PT Eval Moderate Complexity: 1 Mod            Doreatha Massed, PT Acute Rehabilitation  Office: [phone] Pager: [phone]

## 2020-11-21 NOTE — Plan of Care (Signed)

## 2020-11-21 NOTE — Progress Notes (Signed)
Initial Nutrition Assessment  RD working remotely.  DOCUMENTATION CODES:   Not applicable  INTERVENTION:  - will order Boost Breeze BID, each supplement provides 250 kcal and 9 grams of protein. - will order 30 ml Prosource Plus BID, each supplement provides 100 kcal and 15 grams protein.  - diet advancement as medically feasible.    NUTRITION DIAGNOSIS:   Increased nutrient needs related to post-op healing as evidenced by estimated needs.  GOAL:   Patient will meet greater than or equal to 90% of their needs  MONITOR:   PO intake, Diet advancement, Labs, Weight trends  REASON FOR ASSESSMENT:   Malnutrition Screening Tool  ASSESSMENT:   74 year old male with medical history of COPD, CHF, HTN, HLD, asthma, Afib, kidney stones, GERD, CKD, PVD, CAD, AAA, pre-diabetes, and sleep apnea. He was being evaluated for hip surgery and was noted to have worsening creatinine, bilateral renal cysts, and R hydronephrosis. He was taken to the OR where he was found to have UPJ obstruction; stent placed and has been exchanged twice, most recently in 06/2020. Still requires the use of a walker following hip surgery. He presented to the hospital for robotic-assisted L pyeloplasty.  No intakes documented since admission. Patient reported that his weight loss has occurred over the past 1 year.   Weight yesterday was documented as both 158 lb and 160 lb. Both of these are the highest weight documented in the past 7 months with weight on 04/14/20 being 156 lb.   Non-pitting edema to BLE documented in the edema section of flow sheet.   Patient is POD #1 R ureteral stent placement and R robotic-assisted lap pyeloplasty   Labs reviewed; creatinine: 1.47 mg/dl, GFR: 50 ml/min. Medications reviewed.  IVF; D5-1/2 NS @ 100 ml/hr (408 kcal).     NUTRITION - FOCUSED PHYSICAL EXAM:  unable to complete at this time.   Diet Order:   Diet Order            Diet clear liquid Room service appropriate?  Yes; Fluid consistency: Thin  Diet effective now                 EDUCATION NEEDS:   No education needs have been identified at this time  Skin:  Skin Assessment: Reviewed RN Assessment  Last BM:  unknown  Height:   Ht Readings from Last 1 Encounters:  11/20/20 5\' 4"  (1.626 m)    Weight:   Wt Readings from Last 1 Encounters:  11/20/20 72.7 kg     Estimated Nutritional Needs:  Kcal:  5638-7564 kcal Protein:  75-85 grams Fluid:  >/= 1.7 L/day      Jarome Matin, MS, RD, LDN, CNSC Inpatient Clinical Dietitian RD pager # available in [facility]  After hours/weekend pager # available in [facility]

## 2020-11-21 NOTE — Discharge Summary (Signed)
Date of admission: 11/20/2020  Date of discharge: 11/21/2020  Admission diagnosis: Right UPJ obstruction  Discharge diagnosis: Right UPJ obstruction  History and Physical: For full details, please see admission history and physical. Briefly, Dean [name]. is a 74 y.o. gentleman with Right UPJ obstruction.  After discussing management/treatment options, he elected to proceed with surgical treatment.  Hospital Course: [name]. was taken to the operating room on 11/20/2020 and underwent a robotic assisted laparoscopic right pyeloplasty. He tolerated this procedure well and without complications. Postoperatively, he was able to be transferred to a regular hospital room following recovery from anesthesia.  He was able to begin ambulating the night of surgery. He remained hemodynamically stable overnight.  He had excellent urine output and his foley catheter was removed on POD#1 with successful TOV. Following appropriately minimal output from his pelvic drain, his pelvic drain was removed on POD #1.  He was transitioned to oral pain medication, tolerated a clear liquid diet, and had met all discharge criteria and was able to be discharged home later on POD#1. He will be discharged with stent in place to be removed as outpatient.   Laboratory values: Recent Labs    11/20/20 1249 11/21/20 0653  HGB 13.4 12.3*  HCT 41.7 38.2*    Disposition: Home  Discharge instruction: He was instructed to be ambulatory but to refrain from heavy lifting, strenuous activity, or driving. He was instructed on urethral catheter care.  Discharge medications:   Allergies as of 11/21/2020      Reactions   Penicillins    Childhood reaction REACTION: rapid pulse Did it involve swelling of the face/tongue/throat, SOB, or low BP? Yes Did it involve sudden or severe rash/hives, skin peeling, or any reaction on the inside of your mouth or nose? Unknown Did you need to seek medical attention at a hospital  or doctor's office? yes When did it last happen?childhood  If all above answers are "NO", may proceed with cephalosporin use.   Other    Oily seafood salmon-- chest pain   Mold Extract [trichophyton] Rash      Medication List    STOP taking these medications   aspirin 325 MG tablet   Breztri Aerosphere 160-9-4.8 MCG/ACT Aero Generic drug: Budeson-Glycopyrrol-Formoterol   loperamide 2 MG capsule Commonly known as: IMODIUM   methocarbamol 500 MG tablet Commonly known as: ROBAXIN   rivaroxaban 10 MG Tabs tablet Commonly known as: XARELTO   Vitamin D (Ergocalciferol) 1.25 MG (50000 UNIT) Caps capsule Commonly known as: DRISDOL   Vitamin D-3 25 MCG (1000 UT) Caps     TAKE these medications   albuterol (2.5 MG/3ML) 0.083% nebulizer solution Commonly known as: PROVENTIL USE 1 VIAL VIA NEBULIZER EVERY 6 HOURS AS NEEDED FOR WHEEZING OR SHORTNESS OF BREATH What changed:   how much to take  how to take this  when to take this  additional instructions   albuterol 108 (90 Base) MCG/ACT inhaler Commonly known as: Proventil HFA Inhale 1 puff into the lungs 2 (two) times daily. What changed: when to take this   antiseptic oral rinse Liqd 15 mLs by Mouth Rinse route daily as needed for dry mouth.   antiseptic oral rinse Liqd 15 mLs by Mouth Rinse route in the morning and at bedtime. Gel   atorvastatin 10 MG tablet Commonly known as: LIPITOR Take 10 mg by mouth at bedtime.   Azelastine-Fluticasone 137-50 MCG/ACT Susp PLACE 1-2 SPRAYS INTO EACH NOSTRIL AT BEDTIME What changed: See the  new instructions.   BLUE-EMU MAXIMUM STRENGTH EX Apply 1 application topically 3 (three) times daily as needed (pain.).   diltiazem 240 MG 24 hr capsule Commonly known as: TIAZAC Take 240 mg by mouth daily. What changed: Another medication with the same name was removed. Continue taking this medication, and follow the directions you see here.   docusate sodium 100 MG  capsule Commonly known as: COLACE Take 100 mg by mouth daily.   esomeprazole 40 MG capsule Commonly known as: NEXIUM Take 40 mg by mouth daily as needed (acid reflux/indigestion.).   finasteride 5 MG tablet Commonly known as: PROSCAR Take 5 mg by mouth at bedtime.   irbesartan 150 MG tablet Commonly known as: AVAPRO Take 150 mg by mouth every evening.   isosorbide mononitrate 60 MG 24 hr tablet Commonly known as: IMDUR Take 60 mg by mouth every evening.   magnesium oxide 400 MG tablet Commonly known as: MAG-OX Take 400 mg by mouth at bedtime.   pseudoephedrine-acetaminophen 30-500 MG Tabs tablet Commonly known as: TYLENOL SINUS Take 1 tablet by mouth every 4 (four) hours as needed (sinus congestion).   simethicone 125 MG chewable tablet Commonly known as: MYLICON Chew 978 mg by mouth every 6 (six) hours as needed for flatulence. Takes gas x   sulfamethoxazole-trimethoprim 800-160 MG tablet Commonly known as: BACTRIM DS Take 1 tablet by mouth 2 (two) times daily.   SYSTANE ULTRA OP Place 1 drop into both eyes daily as needed (Dry eye).   traMADol 50 MG tablet Commonly known as: ULTRAM Take 1-2 tablets (50-100 mg total) by mouth every 6 (six) hours as needed for moderate pain. What changed: how much to take       Followup: He will followup in a few weeks to discuss plan moving forward and for stent removal.

## 2020-11-22 DIAGNOSIS — N131 Hydronephrosis with ureteral stricture, not elsewhere classified: Secondary | ICD-10-CM | POA: Diagnosis present

## 2020-11-22 DIAGNOSIS — I2510 Atherosclerotic heart disease of native coronary artery without angina pectoris: Secondary | ICD-10-CM | POA: Diagnosis present

## 2020-11-22 DIAGNOSIS — Z91013 Allergy to seafood: Secondary | ICD-10-CM | POA: Diagnosis not present

## 2020-11-22 DIAGNOSIS — Z9049 Acquired absence of other specified parts of digestive tract: Secondary | ICD-10-CM | POA: Diagnosis not present

## 2020-11-22 DIAGNOSIS — N401 Enlarged prostate with lower urinary tract symptoms: Secondary | ICD-10-CM | POA: Diagnosis present

## 2020-11-22 DIAGNOSIS — Z880 Allergy status to penicillin: Secondary | ICD-10-CM | POA: Diagnosis not present

## 2020-11-22 DIAGNOSIS — R0902 Hypoxemia: Secondary | ICD-10-CM | POA: Diagnosis not present

## 2020-11-22 DIAGNOSIS — F17210 Nicotine dependence, cigarettes, uncomplicated: Secondary | ICD-10-CM | POA: Diagnosis present

## 2020-11-22 DIAGNOSIS — R338 Other retention of urine: Secondary | ICD-10-CM | POA: Diagnosis present

## 2020-11-22 DIAGNOSIS — Z79899 Other long term (current) drug therapy: Secondary | ICD-10-CM | POA: Diagnosis not present

## 2020-11-22 DIAGNOSIS — Z8051 Family history of malignant neoplasm of kidney: Secondary | ICD-10-CM | POA: Diagnosis not present

## 2020-11-22 DIAGNOSIS — J449 Chronic obstructive pulmonary disease, unspecified: Secondary | ICD-10-CM | POA: Diagnosis present

## 2020-11-22 DIAGNOSIS — J9811 Atelectasis: Secondary | ICD-10-CM | POA: Diagnosis not present

## 2020-11-22 LAB — CREATININE, FLUID (PLEURAL, PERITONEAL, JP DRAINAGE): Creat, Fluid: 30.5 mg/dL

## 2020-11-22 LAB — BASIC METABOLIC PANEL
Anion gap: 7 (ref 5–15)
BUN: 24 mg/dL — ABNORMAL HIGH (ref 8–23)
CO2: 27 mmol/L (ref 22–32)
Calcium: 8.8 mg/dL — ABNORMAL LOW (ref 8.9–10.3)
Chloride: 101 mmol/L (ref 98–111)
Creatinine, Ser: 2.1 mg/dL — ABNORMAL HIGH (ref 0.61–1.24)
GFR, Estimated: 33 mL/min — ABNORMAL LOW (ref >60)
Glucose, Bld: 93 mg/dL (ref 70–99)
Potassium: 4 mmol/L (ref 3.5–5.1)
Sodium: 135 mmol/L (ref 135–145)

## 2020-11-22 LAB — CBC
HCT: 39.6 % (ref 39.0–52.0)
Hemoglobin: 12.7 g/dL — ABNORMAL LOW (ref 13.0–17.0)
MCH: 31.2 pg (ref 26.0–34.0)
MCHC: 32.1 g/dL (ref 30.0–36.0)
MCV: 97.3 fL (ref 80.0–100.0)
Platelets: 178 10*3/uL (ref 150–400)
RBC: 4.07 MIL/uL — ABNORMAL LOW (ref 4.22–5.81)
RDW: 13.7 % (ref 11.5–15.5)
WBC: 12.4 10*3/uL — ABNORMAL HIGH (ref 4.0–10.5)
nRBC: 0 % (ref 0.0–0.2)

## 2020-11-22 MED ORDER — OXYCODONE HCL 5 MG PO TABS
5.0000 mg | ORAL_TABLET | ORAL | Status: DC | PRN
  Administered 2020-11-22 – 2020-11-23 (×4): 5 mg via ORAL
  Filled 2020-11-22 (×4): qty 1

## 2020-11-22 MED ORDER — POLYETHYLENE GLYCOL 3350 17 G PO PACK
17.0000 g | PACK | Freq: Every day | ORAL | Status: DC
  Administered 2020-11-22 – 2020-11-24 (×3): 17 g via ORAL
  Filled 2020-11-22 (×3): qty 1

## 2020-11-22 MED ORDER — TRAMADOL HCL 50 MG PO TABS: 50.0000 mg | ORAL_TABLET | Freq: Four times a day (QID) | ORAL | Status: DC | PRN

## 2020-11-22 MED ORDER — BISACODYL 10 MG RE SUPP
10.0000 mg | Freq: Every day | RECTAL | Status: DC | PRN
  Administered 2020-11-22: 10 mg via RECTAL
  Filled 2020-11-22: qty 1

## 2020-11-22 MED ORDER — LABETALOL HCL 5 MG/ML IV SOLN
10.0000 mg | INTRAVENOUS | Status: DC | PRN
  Administered 2020-11-22: 10 mg via INTRAVENOUS
  Filled 2020-11-22: qty 4

## 2020-11-22 MED ORDER — ACETAMINOPHEN 325 MG PO TABS: 650.0000 mg | ORAL_TABLET | ORAL | Status: DC | PRN

## 2020-11-22 NOTE — Progress Notes (Signed)
2 Days Post-Op Subjective: Increased JP output throughout the day yesterday following catheter removal, so catheter was replaced overnight sat JP Cr positive  Remains on supplemental O2 despite breathing treatments Minimal ambulation despite PT Pain controlled No BM or flatus yet, but rumblings No n/v and tolerating diet  Objective: Vital signs in last 24 hours: Temp:  [98.1 F (36.7 C)-98.4 F (36.9 C)] 98.1 F (36.7 C) (12/04 2206) Pulse Rate:  [54-67] 55 (12/05 0833) Resp:  [20] 20 (12/04 2206) BP: (140-184)/(70-93) 154/93 (12/05 0833) SpO2:  [78 %-96 %] 96 % (12/05 0833)  Intake/Output from previous day: 12/04 0701 - 12/05 0700 In: 1213.4 [P.O.:60; I.V.:1153.4] Out: 1470 [Urine:525; Drains:945] Intake/Output this shift: No intake/output data recorded.  Physical Exam:  General: Alert and oriented CV: RRR Lungs: normal work of breathing on 4L Abdomen: Soft, ND, bowel sounds normal Incisions: c/d/i, Foley in place draining clear yellow urine. JP in place draining straw-colored fluid. Ext: NT, No erythema  Lab Results: Recent Labs    11/20/20 1249 11/21/20 0653 11/22/20 0757  HGB 13.4 12.3* 12.7*  HCT 41.7 38.2* 39.6   BMET Recent Labs    11/20/20 1249 11/21/20 0653  NA 140 135  K 3.6 4.0  CL 100 98  CO2 29 27  GLUCOSE 149* 136*  BUN 21 18  CREATININE 1.55* 1.47*  CALCIUM 9.2 8.8*     Studies/Results: No results found.  Assessment/Plan: POD#2 s/p R pyeloplasty. Foley reinserted due to increased JP output positive for urine leak.  - continue pain meds prn - encourage OOB/ambulation - continue breathing treatments - wean O2 as tolerated - continue PT - regular diet - medlock today - periop antibiotics complete, will hold off restarting for now   LOS: 1 day   Suzzane Quilter 11/22/2020, 8:45 AM

## 2020-11-22 NOTE — Progress Notes (Addendum)
   11/22/20 0359  Provider Notification  Provider Name/Title Ermelinda Das  Date Provider Notified 11/22/20  Time Provider Notified [phone]  Notification Type Call  Notification Reason Other (Comment) (jp output)  Response See new orders  Date of Provider Response 11/22/20  Time of Provider Response [phone]  Md called regarding increased output from jp drain and minimal urine output.

## 2020-11-23 ENCOUNTER — Inpatient Hospital Stay (HOSPITAL_COMMUNITY): Payer: Medicare PPO

## 2020-11-23 MED ORDER — ALBUTEROL SULFATE HFA 108 (90 BASE) MCG/ACT IN AERS
1.0000 | INHALATION_SPRAY | Freq: Four times a day (QID) | RESPIRATORY_TRACT | Status: DC | PRN
  Administered 2020-11-23: 1 via RESPIRATORY_TRACT
  Filled 2020-11-23: qty 6.7

## 2020-11-23 MED ORDER — FUROSEMIDE 10 MG/ML IJ SOLN
20.0000 mg | Freq: Two times a day (BID) | INTRAMUSCULAR | Status: AC
  Administered 2020-11-23 – 2020-11-24 (×2): 20 mg via INTRAVENOUS
  Filled 2020-11-23 (×2): qty 2

## 2020-11-23 MED ORDER — ACETAMINOPHEN 10 MG/ML IV SOLN
1000.0000 mg | Freq: Four times a day (QID) | INTRAVENOUS | Status: AC
  Administered 2020-11-23 – 2020-11-24 (×4): 1000 mg via INTRAVENOUS
  Filled 2020-11-23 (×4): qty 100

## 2020-11-23 NOTE — Plan of Care (Signed)
  Problem: Education: Goal: Knowledge of General Education information will improve Description: Including pain rating scale, medication(s)/side effects and non-pharmacologic comfort measures 11/23/2020 0339 by Tula Nakayama, RN Outcome: Progressing 11/23/2020 0338 by Tula Nakayama, RN Outcome: Progressing   Problem: Health Behavior/Discharge Planning: Goal: Ability to manage health-related needs will improve 11/23/2020 0339 by Tula Nakayama, RN Outcome: Progressing 11/23/2020 0338 by Glenna Fellows D, RN Outcome: Progressing   Problem: Clinical Measurements: Goal: Ability to maintain clinical measurements within normal limits will improve 11/23/2020 0339 by Glenna Fellows D, RN Outcome: Progressing 11/23/2020 0338 by Glenna Fellows D, RN Outcome: Progressing Goal: Will remain free from infection 11/23/2020 0339 by Glenna Fellows D, RN Outcome: Progressing 11/23/2020 0338 by Glenna Fellows D, RN Outcome: Progressing Goal: Diagnostic test results will improve 11/23/2020 0339 by Glenna Fellows D, RN Outcome: Progressing 11/23/2020 0338 by Glenna Fellows D, RN Outcome: Progressing Goal: Respiratory complications will improve 11/23/2020 0339 by Glenna Fellows D, RN Outcome: Progressing 11/23/2020 0338 by Glenna Fellows D, RN Outcome: Progressing Goal: Cardiovascular complication will be avoided 11/23/2020 0339 by Glenna Fellows D, RN Outcome: Progressing 11/23/2020 0338 by Tula Nakayama, RN Outcome: Progressing   Problem: Activity: Goal: Risk for activity intolerance will decrease 11/23/2020 0339 by Glenna Fellows D, RN Outcome: Progressing 11/23/2020 0338 by Glenna Fellows D, RN Outcome: Progressing   Problem: Nutrition: Goal: Adequate nutrition will be maintained 11/23/2020 0339 by Glenna Fellows D, RN Outcome: Progressing 11/23/2020 0338 by Glenna Fellows D, RN Outcome: Progressing   Problem: Coping: Goal: Level of anxiety will decrease 11/23/2020 0339 by Glenna Fellows D, RN Outcome: Progressing 11/23/2020 0338 by Glenna Fellows D, RN Outcome: Progressing   Problem: Elimination: Goal: Will not experience complications related to bowel motility 11/23/2020 0339 by Glenna Fellows D, RN Outcome: Progressing 11/23/2020 0338 by Glenna Fellows D, RN Outcome: Progressing Goal: Will not experience complications related to urinary retention 11/23/2020 0339 by Tula Nakayama, RN Outcome: Progressing 11/23/2020 0338 by Glenna Fellows D, RN Outcome: Progressing   Problem: Pain Managment: Goal: General experience of comfort will improve 11/23/2020 0339 by Glenna Fellows D, RN Outcome: Progressing 11/23/2020 0338 by Glenna Fellows D, RN Outcome: Progressing   Problem: Safety: Goal: Ability to remain free from injury will improve 11/23/2020 0339 by Tula Nakayama, RN Outcome: Progressing 11/23/2020 0338 by Glenna Fellows D, RN Outcome: Progressing   Problem: Skin Integrity: Goal: Risk for impaired skin integrity will decrease 11/23/2020 0339 by Glenna Fellows D, RN Outcome: Progressing 11/23/2020 0338 by Tula Nakayama, RN Outcome: Progressing

## 2020-11-23 NOTE — Plan of Care (Signed)

## 2020-11-23 NOTE — Progress Notes (Addendum)
3 Days Post-Op Subjective: JP output significantly less with reinsertion of the catheter Had BM yesterday Remains slow to ambulate/ complaining of pain in the lower abdomen Weaned from 4L to 2L [place]  Objective: Vital signs in last 24 hours: Temp:  [98.1 F (36.7 C)-99 F (37.2 C)] 98.3 F (36.8 C) (12/06 0559) Pulse Rate:  [55-68] 63 (12/06 0559) Resp:  [13-18] 18 (12/06 0559) BP: (137-174)/(76-93) 137/87 (12/06 0559) SpO2:  [89 %-96 %] 92 % (12/06 0559)  Intake/Output from previous day: 12/05 0701 - 12/06 0700 In: -  Out: 2975 [Urine:2900; Drains:75] Intake/Output this shift: No intake/output data recorded.  Physical Exam:  General: Alert and oriented CV: RRR Lungs: non-labored breathing Abdomen: Soft, ND, bowel sounds normal Incisions: c/d/i, Foley in place draining clear yellow urine. JP in place draining straw-colored fluid. Ext: NT, No erythema  Lab Results: Recent Labs    11/20/20 1249 11/21/20 0653 11/22/20 0757  HGB 13.4 12.3* 12.7*  HCT 41.7 38.2* 39.6   BMET Recent Labs    11/21/20 0653 11/22/20 0757  NA 135 135  K 4.0 4.0  CL 98 101  CO2 27 27  GLUCOSE 136* 93  BUN 18 24*  CREATININE 1.47* 2.10*  CALCIUM 8.8* 8.8*     Studies/Results: No results found.  Assessment/Plan: POD#3 s/p R pyeloplasty.  Post-op urinary retention causing urine to back-up into kidney and cause urine to leak through incision in the renal pelvis.  Persistent O2 requirement.  Moving bowels/tolerating a regular diet.  - continue pain meds prn - encourage OOB/ambulation - CXR this AM, may give dose of lasix if c/w pulmonary edema.  Continue with breathing treatments, incentive spirometry and ambulation - continue PT - regular diet - medlock IV  - If patient able to wean off O2 (longtime smoker with COPD - okay to d/c with O2 sats >88%) then he'll be discharged home with both the JP drain and foley catheter.  He'll need to be educated about how to manage these.  I have  also ordered home health to check on him and help with his drains.   Addendum: Off O2 his O2 sat is 82%.  CXR is consistent with Atelectasis, possible infiltrate.  Will get respiratory therapy to do breathing treatment, have ordered 20mg  IV lasix BID x 1 day, the patient needs to sit in a chair most of the day and ambulate to get moving and expand lungs.  He also should be aggressively using the incentive spirometer.    Unlikely able to discharge today unless lungs improve.   LOS: 2 days   Ardis Hughs 11/23/2020, 7:27 AM

## 2020-11-24 LAB — BASIC METABOLIC PANEL
Anion gap: 11 (ref 5–15)
BUN: 30 mg/dL — ABNORMAL HIGH (ref 8–23)
CO2: 30 mmol/L (ref 22–32)
Calcium: 9.1 mg/dL (ref 8.9–10.3)
Chloride: 98 mmol/L (ref 98–111)
Creatinine, Ser: 1.61 mg/dL — ABNORMAL HIGH (ref 0.61–1.24)
GFR, Estimated: 45 mL/min — ABNORMAL LOW (ref >60)
Glucose, Bld: 84 mg/dL (ref 70–99)
Potassium: 3.1 mmol/L — ABNORMAL LOW (ref 3.5–5.1)
Sodium: 139 mmol/L (ref 135–145)

## 2020-11-24 MED ORDER — ALBUTEROL SULFATE HFA 108 (90 BASE) MCG/ACT IN AERS: 1.0000 | INHALATION_SPRAY | Freq: Every day | RESPIRATORY_TRACT | Status: DC

## 2020-11-24 NOTE — Discharge Summary (Addendum)
Date of admission: 11/20/2020  Date of discharge: 11/24/2020  Admission diagnosis: Right UPJ obstruction  Discharge diagnosis: same, chronic COPD, urinary retention  Secondary diagnoses:  Patient Active Problem List   Diagnosis Date Noted  . Ureteropelvic junction (UPJ) obstruction, right 11/20/2020  . OA (osteoarthritis) of hip 07/15/2020  . S/P right THA 07/15/2020  . 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  . Atherosclerosis of native coronary artery of native heart without angina pectoris 01/30/2018  . PAOD (peripheral arterial occlusive disease) ([place]) 01/30/2018  . COPD with chronic bronchitis ([place]) 01/30/2018  . Parotid mass 03/26/2017  . Status post bypass graft of extremity 02/06/2017  . Back pain of thoracolumbar region 03/31/2015  . SOB (shortness of breath) 12/25/2014  . PVC's (premature ventricular contractions) 12/25/2014  . Abnormal EKG 12/25/2014  . PAD (peripheral artery disease) ([place]) 12/25/2014  . Dyslipidemia 12/25/2014  . Current smoker 12/25/2014  . Aftercare following surgery of the circulatory system, [place] 12/23/2013  . Screening for depression 11/06/2013  . Cough 06/24/2013  . 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 Blood glucose elevated 11/02/2012  . Candidal dermatitis 11/02/2011  . Adiposity 07/11/2011  . Accumulated fluid in larynx 09/24/2010  . Atypical chest pain 09/23/2010  . Lumbar canal stenosis 08/05/2009  . COPD mixed type ([place]) 04/15/2009  . TOBACCO USER 03/10/2009  . Allergic rhinitis 03/10/2009  . GERD 05/20/2008  . ABDOMINAL PAIN-EPIGASTRIC 05/20/2008  . HYPERLIPIDEMIA 04/11/2008  . Essential hypertension 04/11/2008  . Coronary atherosclerosis 04/11/2008  . PROCTITIS 04/11/2008  . Obstructive sleep apnea 03/01/2008  . Lung nodule 02/29/2008  . RENAL CYST, LEFT 06/15/2007  . COLONIC POLYPS, ADENOMATOUS 08/11/2005  . DIVERTICULOSIS, COLON 08/11/2005  . INTERNAL HEMORRHOIDS 06/04/2002     Procedures performed: Procedure(s): XI ROBOTIC RIGHT ASSISTED PYELOPLASTY AND STENT PLACEMENT  History and Physical: For full details, please see admission history and physical. Briefly, Dean [name]. is a 74 y.o. year old patient with history of UPJ obstruction.   Hospital Course: Patient tolerated the procedure well.  He was then transferred to the floor after an uneventful PACU stay.  His hospital course was complicated by hypoxia, and we were unable to wean him off his oxygen for several days.  Chest Xray was unremarkable, essentially demonstrated atelectasis.  We worked with him on incentive spirometer and administered breathing treatments.  Ultimately we are discharging him home with oxygen.  He was very slow to ambulate.  In addition, he failed his voiding trial, with large volume PVRs.   This resulted in his new incision at the right UPJ to leak due to back up of urine.  The catheter was replaced and the leakage stopped. On POD#4 he had met discharge criteria: was eating a regular diet, was up and ambulating independently,  pain was well controlled, and was ready to for discharge.  He is being discharged home with his foley catheter and his drain.  Home health is being set-up for him.  PE on day of discharge: NAD Vitals:   11/23/20 2127 11/24/20 0549 11/24/20 0907 11/24/20 1145  BP:  (!) 150/92    Pulse: 71 (!) 58    Resp: 16 17    Temp:  98.2 F (36.8 C)    TempSrc:      SpO2: 92% 95% (!) 87% 94%  Weight:      Height:       I/O last 3 completed shifts: In: -  Out: 4315 [Urine:4250; Drains:65]  Non-labored breathing,  breath sounds diminished. Abdomen is soft, incisions c/d/i, drain with serosanguineous drainage  Ext symmetric Foley draining clear yellow urine  Laboratory values:  Recent Labs    11/22/20 0757  WBC 12.4*  HGB 12.7*  HCT 39.6   Recent Labs    11/22/20 0757 11/24/20 0553  NA 135 139  K 4.0 3.1*  CL 101 98  CO2 27 30  GLUCOSE 93 84  BUN  24* 30*  CREATININE 2.10* 1.61*  CALCIUM 8.8* 9.1   No results for input(s): LABPT, INR in the last 72 hours. No results for input(s): LABURIN in the last 72 hours. Results for orders placed or performed during the hospital encounter of 11/17/20  SARS CORONAVIRUS 2 (TAT 6-24 HRS) Nasopharyngeal Nasopharyngeal Swab     Status: None   Collection Time: 11/17/20  1:32 PM   Specimen: Nasopharyngeal Swab  Result Value Ref Range Status   SARS Coronavirus 2 NEGATIVE NEGATIVE Final    Comment: (NOTE) SARS-CoV-2 target nucleic acids are NOT DETECTED.  The SARS-CoV-2 RNA is generally detectable in upper and lower respiratory specimens during the acute phase of infection. Negative results do not preclude SARS-CoV-2 infection, do not rule out co-infections with other pathogens, and should not be used as the sole basis for treatment or other patient management decisions. Negative results must be combined with clinical observations, patient history, and epidemiological information. The expected result is Negative.  Fact Sheet for Patients: SugarRoll.be  Fact Sheet for Healthcare Providers: https://www.woods-mathews.com/  This test is not yet approved or cleared by the [place] FDA and  has been authorized for detection and/or diagnosis of SARS-CoV-2 by FDA under an Emergency Use Authorization (EUA). This EUA will remain  in effect (meaning this test can be used) for the duration of the COVID-19 declaration under Se ction 564(b)(1) of the Act, 21 U.S.C. section 360bbb-3(b)(1), unless the authorization is terminated or revoked sooner.  Performed at [facility], [place] [address] [place], [place] [address]     Disposition: Home  Discharge instruction: The patient was instructed to be ambulatory but told to refrain from heavy lifting, strenuous activity, or driving.   Discharge medications:  Allergies as of 11/24/2020      Reactions    Penicillins    Childhood reaction REACTION: rapid pulse Did it involve swelling of the face/tongue/throat, SOB, or low BP? Yes Did it involve sudden or severe rash/hives, skin peeling, or any reaction on the inside of your mouth or nose? Unknown Did you need to seek medical attention at a hospital or doctor's office? yes When did it last happen?childhood  If all above answers are "NO", may proceed with cephalosporin use.   Other    Oily seafood salmon-- chest pain   Mold Extract [trichophyton] Rash      Medication List    STOP taking these medications   aspirin 325 MG tablet   Breztri Aerosphere 160-9-4.8 MCG/ACT Aero Generic drug: Budeson-Glycopyrrol-Formoterol   loperamide 2 MG capsule Commonly known as: IMODIUM   methocarbamol 500 MG tablet Commonly known as: ROBAXIN   rivaroxaban 10 MG Tabs tablet Commonly known as: XARELTO   Vitamin D (Ergocalciferol) 1.25 MG (50000 UNIT) Caps capsule Commonly known as: DRISDOL   Vitamin D-3 25 MCG (1000 UT) Caps     TAKE these medications   albuterol (2.5 MG/3ML) 0.083% nebulizer solution Commonly known as: PROVENTIL USE 1 VIAL VIA NEBULIZER EVERY 6 HOURS AS NEEDED FOR WHEEZING OR SHORTNESS OF BREATH What changed:  how much to take  how to take this  when to take this  additional instructions   albuterol 108 (90 Base) MCG/ACT inhaler Commonly known as: Proventil HFA Inhale 1 puff into the lungs 2 (two) times daily. What changed: when to take this   antiseptic oral rinse Liqd 15 mLs by Mouth Rinse route daily as needed for dry mouth.   antiseptic oral rinse Liqd 15 mLs by Mouth Rinse route in the morning and at bedtime. Gel   atorvastatin 10 MG tablet Commonly known as: LIPITOR Take 10 mg by mouth at bedtime.   Azelastine-Fluticasone 137-50 MCG/ACT Susp PLACE 1-2 SPRAYS INTO EACH NOSTRIL AT BEDTIME What changed: See the new instructions.   BLUE-EMU MAXIMUM STRENGTH EX Apply 1 application topically 3  (three) times daily as needed (pain.).   diltiazem 240 MG 24 hr capsule Commonly known as: TIAZAC Take 240 mg by mouth daily. What changed: Another medication with the same name was removed. Continue taking this medication, and follow the directions you see here.   docusate sodium 100 MG capsule Commonly known as: COLACE Take 100 mg by mouth daily.   esomeprazole 40 MG capsule Commonly known as: NEXIUM Take 40 mg by mouth daily as needed (acid reflux/indigestion.).   finasteride 5 MG tablet Commonly known as: PROSCAR Take 5 mg by mouth at bedtime.   irbesartan 150 MG tablet Commonly known as: AVAPRO Take 150 mg by mouth every evening.   isosorbide mononitrate 60 MG 24 hr tablet Commonly known as: IMDUR Take 60 mg by mouth every evening.   magnesium oxide 400 MG tablet Commonly known as: MAG-OX Take 400 mg by mouth at bedtime.   pseudoephedrine-acetaminophen 30-500 MG Tabs tablet Commonly known as: TYLENOL SINUS Take 1 tablet by mouth every 4 (four) hours as needed (sinus congestion).   simethicone 125 MG chewable tablet Commonly known as: MYLICON Chew 628 mg by mouth every 6 (six) hours as needed for flatulence. Takes gas x   sulfamethoxazole-trimethoprim 800-160 MG tablet Commonly known as: BACTRIM DS Take 1 tablet by mouth 2 (two) times daily.   SYSTANE ULTRA OP Place 1 drop into both eyes daily as needed (Dry eye).   traMADol 50 MG tablet Commonly known as: ULTRAM Take 1-2 tablets (50-100 mg total) by mouth every 6 (six) hours as needed for moderate pain. What changed: how much to take            Durable Medical Equipment  (From admission, onward)         Start     Ordered   11/24/20 0000  For home use only DME oxygen       Question Answer Comment  Length of Need 6 Months   Mode or (Route) Nasal cannula   Liters per Minute 2   Frequency Continuous (stationary and portable oxygen unit needed)   Oxygen conserving device Yes   Oxygen delivery  system Gas      11/24/20 1236          Followup:   Follow-up Information    Ardis Hughs, MD On 12/10/2020.   Specialty: Urology Why: at 1:15 Contact information: [place] [address] [place] On 11/27/2020.   Why: Catheter removal Contact information: [place] [phone]

## 2020-11-24 NOTE — Discharge Instructions (Signed)
1.  Activity:  You are encouraged to ambulate frequently (about every hour during waking hours) to help prevent blood clots from forming in your legs or lungs.  However, you should not engage in any heavy lifting (> 10-15 lbs), strenuous activity, or straining. 2. Diet: You should advance your diet as instructed by your physician.  It will be normal to have some bloating, nausea, and abdominal discomfort intermittently. 3. Prescriptions:  You will be provided a prescription for pain medication to take as needed.  If your pain is not severe enough to require the prescription pain medication, you may take extra strength Tylenol instead which will have less side effects.  You should also take a prescribed stool softener to avoid straining with bowel movements as the prescription pain medication may constipate you. 4. Incisions: You may remove your dressing bandages 48 hours after surgery if not removed in the hospital.  You will either have some small staples or special tissue glue at each of the incision sites. Once the bandages are removed (if present), the incisions may stay open to air.  You may start showering (but not soaking or bathing in water) the 2nd day after surgery and the incisions simply need to be patted dry after the shower.  No additional care is needed. 5. What to call us about: You should call the office [phone]) if you develop fever > 101 or develop persistent vomiting.  You may resume aspirin, advil, aleve, vitamins, and supplements 7 days after surgery.  Foley Catheter Care A soft, flexible tube (Foley catheter) may have been placed in your bladder to drain urine and fluid. Follow these instructions: Taking Care of the Catheter  Keep the area where the catheter leaves your body clean.   Attach the catheter to the leg so there is no tension on the catheter.   Keep the drainage bag below the level of the bladder, but keep it OFF the floor.   Do not take long soaking baths.  Your caregiver will give instructions about showering.   Wash your hands before touching ANYTHING related to the catheter or bag.   Using mild soap and warm water on a washcloth:   Clean the area closest to the catheter insertion site using a circular motion around the catheter.   Clean the catheter itself by wiping AWAY from the insertion site for several inches down the tube.   NEVER wipe upward as this could sweep bacteria up into the urethra (tube in your body that normally drains the bladder) and cause infection.   Place a small amount of sterile lubricant at the tip of the penis where the catheter is entering.  Taking Care of the Drainage Bags  Two drainage bags may be taken home: a large overnight drainage bag, and a smaller leg bag which fits underneath clothing.   It is okay to wear the overnight bag at any time, but NEVER wear the smaller leg bag at night.   Keep the drainage bag well below the level of your bladder. This prevents backflow of urine into the bladder and allows the urine to drain freely.   Anchor the tubing to your leg to prevent pulling or tension on the catheter. Use tape or a leg strap provided by the hospital.   Empty the drainage bag when it is 1/2 to 3/4 full. Wash your hands before and after touching the bag.   Periodically check the tubing for kinks to make sure there is no pressure on  the tubing which could restrict the flow of urine.  Changing the Drainage Bags  Cleanse both ends of the clean bag with alcohol before changing.   Pinch off the rubber catheter to avoid urine spillage during the disconnection.   Disconnect the dirty bag and connect the clean one.   Empty the dirty bag carefully to avoid a urine spill.   Attach the new bag to the leg with tape or a leg strap.  Cleaning the Drainage Bags  Whenever a drainage bag is disconnected, it must be cleaned quickly so it is ready for the next use.   Wash the bag in warm, soapy water.    Rinse the bag thoroughly with warm water.   Soak the bag for 30 minutes in a solution of white vinegar and water (1 cup vinegar to 1 quart warm water).   Rinse with warm water.  SEEK MEDICAL CARE IF:   You have chills or night sweats.   You are leaking around your catheter or have problems with your catheter. It is not uncommon to have sporadic leakage around your catheter as a result of bladder spasms. If the leakage stops, there is not much need for concern. If you are uncertain, call your caregiver.   You develop side effects that you think are coming from your medicines.  SEEK IMMEDIATE MEDICAL CARE IF:   You are suddenly unable to urinate. Check to see if there are any kinks in the drainage tubing that may cause this. If you cannot find any kinks, call your caregiver immediately. This is an emergency.   You develop shortness of breath or chest pains.   Bleeding persists or clots develop in your urine.   You have a fever.   You develop pain in your back or over your lower belly (abdomen).   You develop pain or swelling in your legs.   Any problems you are having get worse rather than better.  MAKE SURE YOU:   Understand these instructions.   Will watch your condition.   Will get help right away if you are not doing well or get worse.   Drain care: Empty drain daily or as needed.  Record output on a piece of paper to so that we can keep track of output. Keep drain on suction. Place gauze around drain site as needed.

## 2020-11-24 NOTE — TOC Transition Note (Addendum)
Transition of Care [facility]) - CM/SW Discharge Note   Patient Details  Name: Dean [name]. MRN: [id number] Date of Birth: [birth year]  Transition of Care [facility]) CM/SW Contact:  Ross Ludwig, LCSW Phone Number: 11/24/2020, 12:26 PM   Clinical Narrative:     CSW was informed that patient will need oxygen, and also home health services.  CSW contacted [employer] for the oxygen and it will be delivered before patient leaves, and once equipment has been delivered.  CSW was also informed that patient will need [facility] RN and PT.  CSW spoke to [facility] for home health, and they are able to accept patient and see him tomorrow.  Physician ordered [place] [facility] [place] for patient. Patient did not request any other services.  CSW to sign off for now.  Final next level of care: [place] Barriers to Discharge: Equipment Delay   Patient Goals and CMS Choice Patient states their goals for this hospitalization and ongoing recovery are:: To return home with home health and oxygen. CMS Medicare.gov Compare Post Acute Care list provided to:: Patient Choice offered to / list presented to : Patient  Discharge Placement                       Discharge Plan and Services                DME Arranged: Oxygen DME Agency: [employer] Date DME Agency Contacted: 11/24/20 Time DME Agency Contacted: [id number] Representative spoke with at DME Agency: Thedore Mins HH Arranged: RN, PT [place] Agency: [place] Date [place]: 11/24/20 Time [place]: 1115 Representative spoke with at [place]: Cindie  Social Determinants of Health ([place]) Interventions     Readmission Risk Interventions No flowsheet data found.

## 2020-11-24 NOTE — Progress Notes (Signed)
SATURATION QUALIFICATIONS: (This note is used to comply with regulatory documentation for home oxygen)  Patient Saturations on Room Air at Rest = 83%  Patient Saturations on Room Air while Ambulating = 84%  Patient Saturations on 2 Liters of oxygen while Ambulating = 90%

## 2020-11-24 NOTE — Progress Notes (Signed)
Patient successfully demonstrated proper emptying of his JP drain and Foley catheter. D/C education completed on foley and drain care. Awaiting ride, will continue to monitor.

## 2020-11-25 DIAGNOSIS — Z48816 Encounter for surgical aftercare following surgery on the genitourinary system: Secondary | ICD-10-CM | POA: Diagnosis not present

## 2020-11-25 DIAGNOSIS — J449 Chronic obstructive pulmonary disease, unspecified: Secondary | ICD-10-CM | POA: Diagnosis not present

## 2020-11-25 DIAGNOSIS — R338 Other retention of urine: Secondary | ICD-10-CM | POA: Diagnosis not present

## 2020-11-25 DIAGNOSIS — N138 Other obstructive and reflux uropathy: Secondary | ICD-10-CM | POA: Diagnosis not present

## 2020-11-25 DIAGNOSIS — I2510 Atherosclerotic heart disease of native coronary artery without angina pectoris: Secondary | ICD-10-CM | POA: Diagnosis not present

## 2020-11-25 DIAGNOSIS — N130 Hydronephrosis with ureteropelvic junction obstruction: Secondary | ICD-10-CM | POA: Diagnosis not present

## 2020-11-25 DIAGNOSIS — N401 Enlarged prostate with lower urinary tract symptoms: Secondary | ICD-10-CM | POA: Diagnosis not present

## 2020-11-25 DIAGNOSIS — I110 Hypertensive heart disease with heart failure: Secondary | ICD-10-CM | POA: Diagnosis not present

## 2020-11-25 DIAGNOSIS — I509 Heart failure, unspecified: Secondary | ICD-10-CM | POA: Diagnosis not present

## 2020-11-26 DIAGNOSIS — I2510 Atherosclerotic heart disease of native coronary artery without angina pectoris: Secondary | ICD-10-CM | POA: Diagnosis not present

## 2020-11-26 DIAGNOSIS — N401 Enlarged prostate with lower urinary tract symptoms: Secondary | ICD-10-CM | POA: Diagnosis not present

## 2020-11-26 DIAGNOSIS — N138 Other obstructive and reflux uropathy: Secondary | ICD-10-CM | POA: Diagnosis not present

## 2020-11-26 DIAGNOSIS — J449 Chronic obstructive pulmonary disease, unspecified: Secondary | ICD-10-CM | POA: Diagnosis not present

## 2020-11-26 DIAGNOSIS — N130 Hydronephrosis with ureteropelvic junction obstruction: Secondary | ICD-10-CM | POA: Diagnosis not present

## 2020-11-26 DIAGNOSIS — Z48816 Encounter for surgical aftercare following surgery on the genitourinary system: Secondary | ICD-10-CM | POA: Diagnosis not present

## 2020-11-26 DIAGNOSIS — I509 Heart failure, unspecified: Secondary | ICD-10-CM | POA: Diagnosis not present

## 2020-11-26 DIAGNOSIS — R338 Other retention of urine: Secondary | ICD-10-CM | POA: Diagnosis not present

## 2020-11-26 DIAGNOSIS — I110 Hypertensive heart disease with heart failure: Secondary | ICD-10-CM | POA: Diagnosis not present

## 2020-11-27 ENCOUNTER — Other Ambulatory Visit: Payer: Self-pay

## 2020-11-27 NOTE — Patient Outreach (Signed)
[facility]) Care Management  11/27/2020  Dorothy Landgrebe. [birth year] [id number]    EMMI-General Discharge RED ON EMMI ALERT Day # 1 Date: 11/26/2020 Red Alert Reason: "Transportation to follow-up? No Unfilled prescriptions? Yes"   Outreach attempt # 1 to patient. Spoke with patient who reported he was en route to urology appt. Reviewed and addressed red alerts. Patient confirms he has transportation to PCP appt on Monday as well. He reports he is going to talk with both MD's regarding his meds as there were some changes and he needs clarification. He states he has meds with him. He denies any RN CM needs or concerns at this time. Advised patient that they would get one more automated EMMI-GENERAL post discharge calls to assess how they are doing following recent hospitalization and will receive a call from a nurse if any of their responses were abnormal. Patient voiced understanding and was appreciative of f/u call.     Plan: RN CM will close case at this time.   Enzo Montgomery, RN,BSN,CCM [place] [facility] Telephonic Care Management Coordinator Direct Phone: [phone] Toll Free: [phone] Fax: [phone]

## 2020-11-28 DIAGNOSIS — R338 Other retention of urine: Secondary | ICD-10-CM | POA: Diagnosis not present

## 2020-11-28 DIAGNOSIS — I110 Hypertensive heart disease with heart failure: Secondary | ICD-10-CM | POA: Diagnosis not present

## 2020-11-28 DIAGNOSIS — I509 Heart failure, unspecified: Secondary | ICD-10-CM | POA: Diagnosis not present

## 2020-11-28 DIAGNOSIS — N138 Other obstructive and reflux uropathy: Secondary | ICD-10-CM | POA: Diagnosis not present

## 2020-11-28 DIAGNOSIS — I2510 Atherosclerotic heart disease of native coronary artery without angina pectoris: Secondary | ICD-10-CM | POA: Diagnosis not present

## 2020-11-28 DIAGNOSIS — J449 Chronic obstructive pulmonary disease, unspecified: Secondary | ICD-10-CM | POA: Diagnosis not present

## 2020-11-28 DIAGNOSIS — Z48816 Encounter for surgical aftercare following surgery on the genitourinary system: Secondary | ICD-10-CM | POA: Diagnosis not present

## 2020-11-28 DIAGNOSIS — N130 Hydronephrosis with ureteropelvic junction obstruction: Secondary | ICD-10-CM | POA: Diagnosis not present

## 2020-11-28 DIAGNOSIS — N401 Enlarged prostate with lower urinary tract symptoms: Secondary | ICD-10-CM | POA: Diagnosis not present

## 2020-11-30 DIAGNOSIS — I493 Ventricular premature depolarization: Secondary | ICD-10-CM | POA: Diagnosis not present

## 2020-11-30 DIAGNOSIS — E1159 Type 2 diabetes mellitus with other circulatory complications: Secondary | ICD-10-CM | POA: Diagnosis not present

## 2020-11-30 DIAGNOSIS — N401 Enlarged prostate with lower urinary tract symptoms: Secondary | ICD-10-CM | POA: Diagnosis not present

## 2020-11-30 DIAGNOSIS — J449 Chronic obstructive pulmonary disease, unspecified: Secondary | ICD-10-CM | POA: Diagnosis not present

## 2020-11-30 DIAGNOSIS — R0902 Hypoxemia: Secondary | ICD-10-CM | POA: Diagnosis not present

## 2020-11-30 DIAGNOSIS — Z48816 Encounter for surgical aftercare following surgery on the genitourinary system: Secondary | ICD-10-CM | POA: Diagnosis not present

## 2020-11-30 DIAGNOSIS — N1831 Chronic kidney disease, stage 3a: Secondary | ICD-10-CM | POA: Diagnosis not present

## 2020-11-30 DIAGNOSIS — N138 Other obstructive and reflux uropathy: Secondary | ICD-10-CM | POA: Diagnosis not present

## 2020-11-30 DIAGNOSIS — R338 Other retention of urine: Secondary | ICD-10-CM | POA: Diagnosis not present

## 2020-11-30 DIAGNOSIS — N135 Crossing vessel and stricture of ureter without hydronephrosis: Secondary | ICD-10-CM | POA: Diagnosis not present

## 2020-11-30 DIAGNOSIS — I129 Hypertensive chronic kidney disease with stage 1 through stage 4 chronic kidney disease, or unspecified chronic kidney disease: Secondary | ICD-10-CM | POA: Diagnosis not present

## 2020-11-30 DIAGNOSIS — D649 Anemia, unspecified: Secondary | ICD-10-CM | POA: Diagnosis not present

## 2020-11-30 DIAGNOSIS — I2510 Atherosclerotic heart disease of native coronary artery without angina pectoris: Secondary | ICD-10-CM | POA: Diagnosis not present

## 2020-11-30 DIAGNOSIS — I110 Hypertensive heart disease with heart failure: Secondary | ICD-10-CM | POA: Diagnosis not present

## 2020-11-30 DIAGNOSIS — N130 Hydronephrosis with ureteropelvic junction obstruction: Secondary | ICD-10-CM | POA: Diagnosis not present

## 2020-11-30 DIAGNOSIS — I509 Heart failure, unspecified: Secondary | ICD-10-CM | POA: Diagnosis not present

## 2020-12-02 DIAGNOSIS — J449 Chronic obstructive pulmonary disease, unspecified: Secondary | ICD-10-CM | POA: Diagnosis not present

## 2020-12-02 DIAGNOSIS — N138 Other obstructive and reflux uropathy: Secondary | ICD-10-CM | POA: Diagnosis not present

## 2020-12-02 DIAGNOSIS — I2510 Atherosclerotic heart disease of native coronary artery without angina pectoris: Secondary | ICD-10-CM | POA: Diagnosis not present

## 2020-12-02 DIAGNOSIS — I110 Hypertensive heart disease with heart failure: Secondary | ICD-10-CM | POA: Diagnosis not present

## 2020-12-02 DIAGNOSIS — I509 Heart failure, unspecified: Secondary | ICD-10-CM | POA: Diagnosis not present

## 2020-12-02 DIAGNOSIS — N130 Hydronephrosis with ureteropelvic junction obstruction: Secondary | ICD-10-CM | POA: Diagnosis not present

## 2020-12-02 DIAGNOSIS — N401 Enlarged prostate with lower urinary tract symptoms: Secondary | ICD-10-CM | POA: Diagnosis not present

## 2020-12-02 DIAGNOSIS — Z48816 Encounter for surgical aftercare following surgery on the genitourinary system: Secondary | ICD-10-CM | POA: Diagnosis not present

## 2020-12-02 DIAGNOSIS — R338 Other retention of urine: Secondary | ICD-10-CM | POA: Diagnosis not present

## 2020-12-03 DIAGNOSIS — I2510 Atherosclerotic heart disease of native coronary artery without angina pectoris: Secondary | ICD-10-CM | POA: Diagnosis not present

## 2020-12-03 DIAGNOSIS — I110 Hypertensive heart disease with heart failure: Secondary | ICD-10-CM | POA: Diagnosis not present

## 2020-12-03 DIAGNOSIS — R338 Other retention of urine: Secondary | ICD-10-CM | POA: Diagnosis not present

## 2020-12-03 DIAGNOSIS — J449 Chronic obstructive pulmonary disease, unspecified: Secondary | ICD-10-CM | POA: Diagnosis not present

## 2020-12-03 DIAGNOSIS — N138 Other obstructive and reflux uropathy: Secondary | ICD-10-CM | POA: Diagnosis not present

## 2020-12-03 DIAGNOSIS — N130 Hydronephrosis with ureteropelvic junction obstruction: Secondary | ICD-10-CM | POA: Diagnosis not present

## 2020-12-03 DIAGNOSIS — Z48816 Encounter for surgical aftercare following surgery on the genitourinary system: Secondary | ICD-10-CM | POA: Diagnosis not present

## 2020-12-03 DIAGNOSIS — N401 Enlarged prostate with lower urinary tract symptoms: Secondary | ICD-10-CM | POA: Diagnosis not present

## 2020-12-03 DIAGNOSIS — I509 Heart failure, unspecified: Secondary | ICD-10-CM | POA: Diagnosis not present

## 2020-12-04 DIAGNOSIS — I110 Hypertensive heart disease with heart failure: Secondary | ICD-10-CM | POA: Diagnosis not present

## 2020-12-04 DIAGNOSIS — R338 Other retention of urine: Secondary | ICD-10-CM | POA: Diagnosis not present

## 2020-12-04 DIAGNOSIS — Z48816 Encounter for surgical aftercare following surgery on the genitourinary system: Secondary | ICD-10-CM | POA: Diagnosis not present

## 2020-12-04 DIAGNOSIS — J449 Chronic obstructive pulmonary disease, unspecified: Secondary | ICD-10-CM | POA: Diagnosis not present

## 2020-12-04 DIAGNOSIS — N401 Enlarged prostate with lower urinary tract symptoms: Secondary | ICD-10-CM | POA: Diagnosis not present

## 2020-12-04 DIAGNOSIS — N138 Other obstructive and reflux uropathy: Secondary | ICD-10-CM | POA: Diagnosis not present

## 2020-12-04 DIAGNOSIS — I509 Heart failure, unspecified: Secondary | ICD-10-CM | POA: Diagnosis not present

## 2020-12-04 DIAGNOSIS — N130 Hydronephrosis with ureteropelvic junction obstruction: Secondary | ICD-10-CM | POA: Diagnosis not present

## 2020-12-04 DIAGNOSIS — I2510 Atherosclerotic heart disease of native coronary artery without angina pectoris: Secondary | ICD-10-CM | POA: Diagnosis not present

## 2020-12-07 DIAGNOSIS — I2510 Atherosclerotic heart disease of native coronary artery without angina pectoris: Secondary | ICD-10-CM | POA: Diagnosis not present

## 2020-12-07 DIAGNOSIS — I110 Hypertensive heart disease with heart failure: Secondary | ICD-10-CM | POA: Diagnosis not present

## 2020-12-07 DIAGNOSIS — Z48816 Encounter for surgical aftercare following surgery on the genitourinary system: Secondary | ICD-10-CM | POA: Diagnosis not present

## 2020-12-07 DIAGNOSIS — N130 Hydronephrosis with ureteropelvic junction obstruction: Secondary | ICD-10-CM | POA: Diagnosis not present

## 2020-12-07 DIAGNOSIS — R338 Other retention of urine: Secondary | ICD-10-CM | POA: Diagnosis not present

## 2020-12-07 DIAGNOSIS — I509 Heart failure, unspecified: Secondary | ICD-10-CM | POA: Diagnosis not present

## 2020-12-07 DIAGNOSIS — N138 Other obstructive and reflux uropathy: Secondary | ICD-10-CM | POA: Diagnosis not present

## 2020-12-07 DIAGNOSIS — J449 Chronic obstructive pulmonary disease, unspecified: Secondary | ICD-10-CM | POA: Diagnosis not present

## 2020-12-07 DIAGNOSIS — N401 Enlarged prostate with lower urinary tract symptoms: Secondary | ICD-10-CM | POA: Diagnosis not present

## 2020-12-08 DIAGNOSIS — N401 Enlarged prostate with lower urinary tract symptoms: Secondary | ICD-10-CM | POA: Diagnosis not present

## 2020-12-08 DIAGNOSIS — N130 Hydronephrosis with ureteropelvic junction obstruction: Secondary | ICD-10-CM | POA: Diagnosis not present

## 2020-12-08 DIAGNOSIS — Z48816 Encounter for surgical aftercare following surgery on the genitourinary system: Secondary | ICD-10-CM | POA: Diagnosis not present

## 2020-12-08 DIAGNOSIS — I110 Hypertensive heart disease with heart failure: Secondary | ICD-10-CM | POA: Diagnosis not present

## 2020-12-08 DIAGNOSIS — J449 Chronic obstructive pulmonary disease, unspecified: Secondary | ICD-10-CM | POA: Diagnosis not present

## 2020-12-08 DIAGNOSIS — R338 Other retention of urine: Secondary | ICD-10-CM | POA: Diagnosis not present

## 2020-12-08 DIAGNOSIS — I509 Heart failure, unspecified: Secondary | ICD-10-CM | POA: Diagnosis not present

## 2020-12-08 DIAGNOSIS — I2510 Atherosclerotic heart disease of native coronary artery without angina pectoris: Secondary | ICD-10-CM | POA: Diagnosis not present

## 2020-12-08 DIAGNOSIS — N138 Other obstructive and reflux uropathy: Secondary | ICD-10-CM | POA: Diagnosis not present

## 2020-12-09 DIAGNOSIS — N138 Other obstructive and reflux uropathy: Secondary | ICD-10-CM | POA: Diagnosis not present

## 2020-12-09 DIAGNOSIS — I110 Hypertensive heart disease with heart failure: Secondary | ICD-10-CM | POA: Diagnosis not present

## 2020-12-09 DIAGNOSIS — R338 Other retention of urine: Secondary | ICD-10-CM | POA: Diagnosis not present

## 2020-12-09 DIAGNOSIS — I2510 Atherosclerotic heart disease of native coronary artery without angina pectoris: Secondary | ICD-10-CM | POA: Diagnosis not present

## 2020-12-09 DIAGNOSIS — Z48816 Encounter for surgical aftercare following surgery on the genitourinary system: Secondary | ICD-10-CM | POA: Diagnosis not present

## 2020-12-09 DIAGNOSIS — N130 Hydronephrosis with ureteropelvic junction obstruction: Secondary | ICD-10-CM | POA: Diagnosis not present

## 2020-12-09 DIAGNOSIS — I509 Heart failure, unspecified: Secondary | ICD-10-CM | POA: Diagnosis not present

## 2020-12-09 DIAGNOSIS — J449 Chronic obstructive pulmonary disease, unspecified: Secondary | ICD-10-CM | POA: Diagnosis not present

## 2020-12-09 DIAGNOSIS — N401 Enlarged prostate with lower urinary tract symptoms: Secondary | ICD-10-CM | POA: Diagnosis not present

## 2020-12-10 DIAGNOSIS — N1339 Other hydronephrosis: Secondary | ICD-10-CM | POA: Diagnosis not present

## 2020-12-14 DIAGNOSIS — N130 Hydronephrosis with ureteropelvic junction obstruction: Secondary | ICD-10-CM | POA: Diagnosis not present

## 2020-12-14 DIAGNOSIS — I2510 Atherosclerotic heart disease of native coronary artery without angina pectoris: Secondary | ICD-10-CM | POA: Diagnosis not present

## 2020-12-14 DIAGNOSIS — N138 Other obstructive and reflux uropathy: Secondary | ICD-10-CM | POA: Diagnosis not present

## 2020-12-14 DIAGNOSIS — J449 Chronic obstructive pulmonary disease, unspecified: Secondary | ICD-10-CM | POA: Diagnosis not present

## 2020-12-14 DIAGNOSIS — R338 Other retention of urine: Secondary | ICD-10-CM | POA: Diagnosis not present

## 2020-12-14 DIAGNOSIS — I509 Heart failure, unspecified: Secondary | ICD-10-CM | POA: Diagnosis not present

## 2020-12-14 DIAGNOSIS — Z48816 Encounter for surgical aftercare following surgery on the genitourinary system: Secondary | ICD-10-CM | POA: Diagnosis not present

## 2020-12-14 DIAGNOSIS — N401 Enlarged prostate with lower urinary tract symptoms: Secondary | ICD-10-CM | POA: Diagnosis not present

## 2020-12-14 DIAGNOSIS — I110 Hypertensive heart disease with heart failure: Secondary | ICD-10-CM | POA: Diagnosis not present

## 2020-12-16 DIAGNOSIS — N138 Other obstructive and reflux uropathy: Secondary | ICD-10-CM | POA: Diagnosis not present

## 2020-12-16 DIAGNOSIS — Z48816 Encounter for surgical aftercare following surgery on the genitourinary system: Secondary | ICD-10-CM | POA: Diagnosis not present

## 2020-12-16 DIAGNOSIS — N130 Hydronephrosis with ureteropelvic junction obstruction: Secondary | ICD-10-CM | POA: Diagnosis not present

## 2020-12-16 DIAGNOSIS — I509 Heart failure, unspecified: Secondary | ICD-10-CM | POA: Diagnosis not present

## 2020-12-16 DIAGNOSIS — I2510 Atherosclerotic heart disease of native coronary artery without angina pectoris: Secondary | ICD-10-CM | POA: Diagnosis not present

## 2020-12-16 DIAGNOSIS — R338 Other retention of urine: Secondary | ICD-10-CM | POA: Diagnosis not present

## 2020-12-16 DIAGNOSIS — J449 Chronic obstructive pulmonary disease, unspecified: Secondary | ICD-10-CM | POA: Diagnosis not present

## 2020-12-16 DIAGNOSIS — I110 Hypertensive heart disease with heart failure: Secondary | ICD-10-CM | POA: Diagnosis not present

## 2020-12-16 DIAGNOSIS — N401 Enlarged prostate with lower urinary tract symptoms: Secondary | ICD-10-CM | POA: Diagnosis not present

## 2020-12-21 DIAGNOSIS — I110 Hypertensive heart disease with heart failure: Secondary | ICD-10-CM | POA: Diagnosis not present

## 2020-12-21 DIAGNOSIS — N401 Enlarged prostate with lower urinary tract symptoms: Secondary | ICD-10-CM | POA: Diagnosis not present

## 2020-12-21 DIAGNOSIS — J449 Chronic obstructive pulmonary disease, unspecified: Secondary | ICD-10-CM | POA: Diagnosis not present

## 2020-12-21 DIAGNOSIS — R338 Other retention of urine: Secondary | ICD-10-CM | POA: Diagnosis not present

## 2020-12-21 DIAGNOSIS — I509 Heart failure, unspecified: Secondary | ICD-10-CM | POA: Diagnosis not present

## 2020-12-21 DIAGNOSIS — I2510 Atherosclerotic heart disease of native coronary artery without angina pectoris: Secondary | ICD-10-CM | POA: Diagnosis not present

## 2020-12-21 DIAGNOSIS — N130 Hydronephrosis with ureteropelvic junction obstruction: Secondary | ICD-10-CM | POA: Diagnosis not present

## 2020-12-21 DIAGNOSIS — Z48816 Encounter for surgical aftercare following surgery on the genitourinary system: Secondary | ICD-10-CM | POA: Diagnosis not present

## 2020-12-21 DIAGNOSIS — N138 Other obstructive and reflux uropathy: Secondary | ICD-10-CM | POA: Diagnosis not present

## 2020-12-22 DIAGNOSIS — N130 Hydronephrosis with ureteropelvic junction obstruction: Secondary | ICD-10-CM | POA: Diagnosis not present

## 2020-12-22 DIAGNOSIS — Z48816 Encounter for surgical aftercare following surgery on the genitourinary system: Secondary | ICD-10-CM | POA: Diagnosis not present

## 2020-12-22 DIAGNOSIS — R338 Other retention of urine: Secondary | ICD-10-CM | POA: Diagnosis not present

## 2020-12-22 DIAGNOSIS — I509 Heart failure, unspecified: Secondary | ICD-10-CM | POA: Diagnosis not present

## 2020-12-22 DIAGNOSIS — N138 Other obstructive and reflux uropathy: Secondary | ICD-10-CM | POA: Diagnosis not present

## 2020-12-22 DIAGNOSIS — I110 Hypertensive heart disease with heart failure: Secondary | ICD-10-CM | POA: Diagnosis not present

## 2020-12-22 DIAGNOSIS — I2510 Atherosclerotic heart disease of native coronary artery without angina pectoris: Secondary | ICD-10-CM | POA: Diagnosis not present

## 2020-12-22 DIAGNOSIS — J449 Chronic obstructive pulmonary disease, unspecified: Secondary | ICD-10-CM | POA: Diagnosis not present

## 2020-12-22 DIAGNOSIS — N401 Enlarged prostate with lower urinary tract symptoms: Secondary | ICD-10-CM | POA: Diagnosis not present

## 2020-12-23 DIAGNOSIS — J449 Chronic obstructive pulmonary disease, unspecified: Secondary | ICD-10-CM | POA: Diagnosis not present

## 2020-12-23 DIAGNOSIS — N138 Other obstructive and reflux uropathy: Secondary | ICD-10-CM | POA: Diagnosis not present

## 2020-12-23 DIAGNOSIS — N130 Hydronephrosis with ureteropelvic junction obstruction: Secondary | ICD-10-CM | POA: Diagnosis not present

## 2020-12-23 DIAGNOSIS — I110 Hypertensive heart disease with heart failure: Secondary | ICD-10-CM | POA: Diagnosis not present

## 2020-12-23 DIAGNOSIS — R338 Other retention of urine: Secondary | ICD-10-CM | POA: Diagnosis not present

## 2020-12-23 DIAGNOSIS — Z48816 Encounter for surgical aftercare following surgery on the genitourinary system: Secondary | ICD-10-CM | POA: Diagnosis not present

## 2020-12-23 DIAGNOSIS — N401 Enlarged prostate with lower urinary tract symptoms: Secondary | ICD-10-CM | POA: Diagnosis not present

## 2020-12-23 DIAGNOSIS — I509 Heart failure, unspecified: Secondary | ICD-10-CM | POA: Diagnosis not present

## 2020-12-23 DIAGNOSIS — I2510 Atherosclerotic heart disease of native coronary artery without angina pectoris: Secondary | ICD-10-CM | POA: Diagnosis not present

## 2020-12-25 DIAGNOSIS — J449 Chronic obstructive pulmonary disease, unspecified: Secondary | ICD-10-CM | POA: Diagnosis not present

## 2020-12-27 NOTE — Progress Notes (Signed)
Patient ID: Dean [name], male    DOB: 03/10/1946, 75 y.o.   MRN: [id number]  HPI M  Smoker followed for COPD, obstructive sleep apnea, lung nodule, allergic rhinitis. NPSG 04/06/1999   AHI 87/hr, desaturation to 83%, body weight 185 pounds PFT- /04/2009-severe obstructive airways disease with insignificant response to bronchodilator. FVC 3.18/83%, FEV1 1.53/56%, ratio 0.48, TLC 86%, DLCO 51% Office Spirometry 03/20/2017-very severe obstructive airways disease. 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 CPAP auto 5-20/ [employer] Download compliance 100%, AHI 0.5/ hr Body weight today 161 lbs Neb albuterol, albuterol hfa,  -----no problems with cpap. no leaks in mask He is very pleased with CPAP, using it every night.  -Planning R THR/ Dr Mardelle Matte Had GOT for cystoscopy 9/32/67 without complication. Cough stable, occasionally productive clear mucus.  Mobility limited by hip pain. Comes with rolling walker today.  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 Breathing feels baseline, considering his smoking. No acute exacerbation. Uses his inhalers CXR 11/23/20- 1V-  Bronchitic changes with questionable bibasilar  atelectasis versus infiltrate, greater on RIGHT.   ROS-see HPI  + = positive Constitutional:  +   weight loss, night sweats, fevers, chills, fatigue, lassitude. HEENT:   No-  headaches, difficulty swallowing, tooth/dental problems, sore throat,       No-  sneezing, itching, ear ache, +nasal congestion, post nasal drip,  CV:  No-   chest pain, orthopnea, PND, swelling in lower extremities, anasarca,  dizziness, palpitations Resp: + shortness of breath with exertion or at rest.           +   productive cough,  + non-productive cough,  No- coughing up of blood.              No-   change in color of mucus.  + wheezing.   Skin: No-   rash or lesions. GI:  No-   heartburn, indigestion, abdominal pain, nausea, vomiting, + diarrhea GU:  MS:  + joint pain or swelling, + back pain . Neuro-     nothing unusual Psych:  No- change in mood or affect. No depression or anxiety.  No memory loss.  Objective:  OBJ- Physical Exam General- Alert, Oriented, Affect-appropriate, Distress- none acute. + Odor of tobacco,  Skin- rash-none, lesions- none, excoriation- none Lymphadenopathy- none Head- atraumatic            Eyes- Gross vision intact, PERRLA, conjunctivae and secretions clear            Ears- Hearing, canals-normal            Nose- + Turbinate edema, no-Septal dev, mucus, polyps, erosion, perforation             Throat- Mallampati III , mucosa clear , drainage- none, tonsils- atrophic Neck- flexible , trachea midline, no stridor ,  thyroid nl, carotid no bruit, + mass left submandibular Chest - symmetrical excursion , unlabored           Heart/CV- RRR , no murmur , no gallop  , no rub, nl s1 s2                           - JVD- none , edema- none, stasis changes- none, varices- none           Lung- + diminished, wheeze- none,  Cough none, dullness-none, rub- none           Chest wall-  Abd-  Br/ Gen/ Rectal- Not done, not indicated Extrem- cyanosis- none, clubbing, none, atrophy- none, strength-  nl + Cane Neuro- grossly intact to observation

## 2020-12-28 ENCOUNTER — Encounter: Payer: Self-pay | Admitting: Internal Medicine

## 2020-12-28 ENCOUNTER — Ambulatory Visit (INDEPENDENT_AMBULATORY_CARE_PROVIDER_SITE_OTHER): Payer: Medicare PPO

## 2020-12-28 ENCOUNTER — Ambulatory Visit (INDEPENDENT_AMBULATORY_CARE_PROVIDER_SITE_OTHER): Payer: Medicare PPO | Admitting: Internal Medicine

## 2020-12-28 ENCOUNTER — Other Ambulatory Visit: Payer: Self-pay

## 2020-12-28 VITALS — BP 138/60 | HR 58 | Temp 97.0°F | Ht 65.0 in | Wt 150.2 lb

## 2020-12-28 DIAGNOSIS — F17200 Nicotine dependence, unspecified, uncomplicated: Secondary | ICD-10-CM

## 2020-12-28 DIAGNOSIS — J449 Chronic obstructive pulmonary disease, unspecified: Secondary | ICD-10-CM

## 2020-12-28 DIAGNOSIS — J439 Emphysema, unspecified: Secondary | ICD-10-CM | POA: Diagnosis not present

## 2020-12-28 DIAGNOSIS — G4733 Obstructive sleep apnea (adult) (pediatric): Secondary | ICD-10-CM | POA: Diagnosis not present

## 2020-12-28 NOTE — Patient Instructions (Signed)
Order- CXR   Dx COPD mixed type  We can continue CPAP auto 5-20  Ok to continue current meds  Set a good goal for yourself to try to quit smoking this year !!  Please call if we can help

## 2020-12-29 DIAGNOSIS — N130 Hydronephrosis with ureteropelvic junction obstruction: Secondary | ICD-10-CM | POA: Diagnosis not present

## 2020-12-29 DIAGNOSIS — N138 Other obstructive and reflux uropathy: Secondary | ICD-10-CM | POA: Diagnosis not present

## 2020-12-29 DIAGNOSIS — R338 Other retention of urine: Secondary | ICD-10-CM | POA: Diagnosis not present

## 2020-12-29 DIAGNOSIS — Z48816 Encounter for surgical aftercare following surgery on the genitourinary system: Secondary | ICD-10-CM | POA: Diagnosis not present

## 2020-12-29 DIAGNOSIS — I2510 Atherosclerotic heart disease of native coronary artery without angina pectoris: Secondary | ICD-10-CM | POA: Diagnosis not present

## 2020-12-29 DIAGNOSIS — N401 Enlarged prostate with lower urinary tract symptoms: Secondary | ICD-10-CM | POA: Diagnosis not present

## 2020-12-29 DIAGNOSIS — I509 Heart failure, unspecified: Secondary | ICD-10-CM | POA: Diagnosis not present

## 2020-12-29 DIAGNOSIS — I110 Hypertensive heart disease with heart failure: Secondary | ICD-10-CM | POA: Diagnosis not present

## 2020-12-29 DIAGNOSIS — J449 Chronic obstructive pulmonary disease, unspecified: Secondary | ICD-10-CM | POA: Diagnosis not present

## 2020-12-30 ENCOUNTER — Encounter: Payer: Self-pay | Admitting: *Deleted

## 2020-12-30 DIAGNOSIS — R338 Other retention of urine: Secondary | ICD-10-CM | POA: Diagnosis not present

## 2020-12-30 DIAGNOSIS — N138 Other obstructive and reflux uropathy: Secondary | ICD-10-CM | POA: Diagnosis not present

## 2020-12-30 DIAGNOSIS — J449 Chronic obstructive pulmonary disease, unspecified: Secondary | ICD-10-CM | POA: Diagnosis not present

## 2020-12-30 DIAGNOSIS — N130 Hydronephrosis with ureteropelvic junction obstruction: Secondary | ICD-10-CM | POA: Diagnosis not present

## 2020-12-30 DIAGNOSIS — I509 Heart failure, unspecified: Secondary | ICD-10-CM | POA: Diagnosis not present

## 2020-12-30 DIAGNOSIS — I2510 Atherosclerotic heart disease of native coronary artery without angina pectoris: Secondary | ICD-10-CM | POA: Diagnosis not present

## 2020-12-30 DIAGNOSIS — N401 Enlarged prostate with lower urinary tract symptoms: Secondary | ICD-10-CM | POA: Diagnosis not present

## 2020-12-30 DIAGNOSIS — I110 Hypertensive heart disease with heart failure: Secondary | ICD-10-CM | POA: Diagnosis not present

## 2020-12-30 DIAGNOSIS — Z48816 Encounter for surgical aftercare following surgery on the genitourinary system: Secondary | ICD-10-CM | POA: Diagnosis not present

## 2020-12-31 DIAGNOSIS — N138 Other obstructive and reflux uropathy: Secondary | ICD-10-CM | POA: Diagnosis not present

## 2020-12-31 DIAGNOSIS — I110 Hypertensive heart disease with heart failure: Secondary | ICD-10-CM | POA: Diagnosis not present

## 2020-12-31 DIAGNOSIS — I509 Heart failure, unspecified: Secondary | ICD-10-CM | POA: Diagnosis not present

## 2020-12-31 DIAGNOSIS — N130 Hydronephrosis with ureteropelvic junction obstruction: Secondary | ICD-10-CM | POA: Diagnosis not present

## 2020-12-31 DIAGNOSIS — J449 Chronic obstructive pulmonary disease, unspecified: Secondary | ICD-10-CM | POA: Diagnosis not present

## 2020-12-31 DIAGNOSIS — Z48816 Encounter for surgical aftercare following surgery on the genitourinary system: Secondary | ICD-10-CM | POA: Diagnosis not present

## 2020-12-31 DIAGNOSIS — R338 Other retention of urine: Secondary | ICD-10-CM | POA: Diagnosis not present

## 2020-12-31 DIAGNOSIS — N401 Enlarged prostate with lower urinary tract symptoms: Secondary | ICD-10-CM | POA: Diagnosis not present

## 2020-12-31 DIAGNOSIS — I2510 Atherosclerotic heart disease of native coronary artery without angina pectoris: Secondary | ICD-10-CM | POA: Diagnosis not present

## 2021-01-05 ENCOUNTER — Telehealth: Payer: Self-pay | Admitting: Internal Medicine

## 2021-01-05 DIAGNOSIS — N130 Hydronephrosis with ureteropelvic junction obstruction: Secondary | ICD-10-CM | POA: Diagnosis not present

## 2021-01-05 DIAGNOSIS — I110 Hypertensive heart disease with heart failure: Secondary | ICD-10-CM | POA: Diagnosis not present

## 2021-01-05 DIAGNOSIS — Z48816 Encounter for surgical aftercare following surgery on the genitourinary system: Secondary | ICD-10-CM | POA: Diagnosis not present

## 2021-01-05 DIAGNOSIS — I2510 Atherosclerotic heart disease of native coronary artery without angina pectoris: Secondary | ICD-10-CM | POA: Diagnosis not present

## 2021-01-05 DIAGNOSIS — N401 Enlarged prostate with lower urinary tract symptoms: Secondary | ICD-10-CM | POA: Diagnosis not present

## 2021-01-05 DIAGNOSIS — N138 Other obstructive and reflux uropathy: Secondary | ICD-10-CM | POA: Diagnosis not present

## 2021-01-05 DIAGNOSIS — J449 Chronic obstructive pulmonary disease, unspecified: Secondary | ICD-10-CM | POA: Diagnosis not present

## 2021-01-05 DIAGNOSIS — I509 Heart failure, unspecified: Secondary | ICD-10-CM | POA: Diagnosis not present

## 2021-01-05 DIAGNOSIS — R338 Other retention of urine: Secondary | ICD-10-CM | POA: Diagnosis not present

## 2021-01-05 NOTE — Telephone Encounter (Signed)
Pt c/o swelling: STAT is pt has developed SOB within 24 hours  1) How much weight have you gained and in what time span? 5 pounds in 5 days   2) If swelling, where is the swelling located? Shin to foot +1 edema  3) Are you currently taking a fluid pill? no  4) Are you currently SOB? Increased SOB this morning but a dose of Albuterol seemed to help   5) Do you have a log of your daily weights (if so, list)?   Pt did not provide that to [facility]   6) Have you gained 3 pounds in a day or 5 pounds in a week? 5 pounds in 5 days   7) Have you traveled recently? No  HHN also said the patient had some covid like symptoms ( sore throat, nasal congestion, fatigue, just doesn't feel great overall). HHN advised patient to go get a home test. HHN reports pt does not have a fever

## 2021-01-05 NOTE — Telephone Encounter (Signed)
Spoke with Fortino Sic, home health nurse with pt in the room. Eustaquio Maize explains that she was out to see Mr. Lyssy on Thursday of last week (1/13) and at that time pt didn't have swelling and weighed 147lbs. Beth states that pt has swelling is his lower legs from foot to shin about 1+ edema.  Pt states that he has not felt the best in the last few days and hasn't had an appetite. Pt has had no changes in medication recently and has been taking his medication as directed. Beth states that pt has not had this issue since his hospital d/c.    Beth would also like to make [place] aware of pt's covid like symptoms including sore throat, nasal congestion and fatigue. Pt has not had a fever and home health nurse recommends that he get a covid test.   Pt does not take a diuretic. Pt was discharged from the hospital on 11/21/20 after an UPJ obstruction and intervention.

## 2021-01-05 NOTE — Telephone Encounter (Signed)
Sounds like he should have a COVID test as suggested. If negative, could try to get an appt with an APP to evaluate edema in the office.  Dr Lemmie Evens

## 2021-01-06 NOTE — Telephone Encounter (Signed)
Spoke with patient and relayed MD advice on COVID testing. Sent him a Pharmacist, community message with testing info and to call back for appointment

## 2021-01-07 DIAGNOSIS — N401 Enlarged prostate with lower urinary tract symptoms: Secondary | ICD-10-CM | POA: Diagnosis not present

## 2021-01-07 DIAGNOSIS — N138 Other obstructive and reflux uropathy: Secondary | ICD-10-CM | POA: Diagnosis not present

## 2021-01-07 DIAGNOSIS — J449 Chronic obstructive pulmonary disease, unspecified: Secondary | ICD-10-CM | POA: Diagnosis not present

## 2021-01-07 DIAGNOSIS — I509 Heart failure, unspecified: Secondary | ICD-10-CM | POA: Diagnosis not present

## 2021-01-07 DIAGNOSIS — R338 Other retention of urine: Secondary | ICD-10-CM | POA: Diagnosis not present

## 2021-01-07 DIAGNOSIS — Z48816 Encounter for surgical aftercare following surgery on the genitourinary system: Secondary | ICD-10-CM | POA: Diagnosis not present

## 2021-01-07 DIAGNOSIS — N130 Hydronephrosis with ureteropelvic junction obstruction: Secondary | ICD-10-CM | POA: Diagnosis not present

## 2021-01-07 DIAGNOSIS — I2510 Atherosclerotic heart disease of native coronary artery without angina pectoris: Secondary | ICD-10-CM | POA: Diagnosis not present

## 2021-01-07 DIAGNOSIS — I110 Hypertensive heart disease with heart failure: Secondary | ICD-10-CM | POA: Diagnosis not present

## 2021-01-11 DIAGNOSIS — I110 Hypertensive heart disease with heart failure: Secondary | ICD-10-CM | POA: Diagnosis not present

## 2021-01-11 DIAGNOSIS — Z48816 Encounter for surgical aftercare following surgery on the genitourinary system: Secondary | ICD-10-CM | POA: Diagnosis not present

## 2021-01-11 DIAGNOSIS — J449 Chronic obstructive pulmonary disease, unspecified: Secondary | ICD-10-CM | POA: Diagnosis not present

## 2021-01-11 DIAGNOSIS — R338 Other retention of urine: Secondary | ICD-10-CM | POA: Diagnosis not present

## 2021-01-11 DIAGNOSIS — N138 Other obstructive and reflux uropathy: Secondary | ICD-10-CM | POA: Diagnosis not present

## 2021-01-11 DIAGNOSIS — I2510 Atherosclerotic heart disease of native coronary artery without angina pectoris: Secondary | ICD-10-CM | POA: Diagnosis not present

## 2021-01-11 DIAGNOSIS — N401 Enlarged prostate with lower urinary tract symptoms: Secondary | ICD-10-CM | POA: Diagnosis not present

## 2021-01-11 DIAGNOSIS — I509 Heart failure, unspecified: Secondary | ICD-10-CM | POA: Diagnosis not present

## 2021-01-11 DIAGNOSIS — N130 Hydronephrosis with ureteropelvic junction obstruction: Secondary | ICD-10-CM | POA: Diagnosis not present

## 2021-01-12 DIAGNOSIS — I2510 Atherosclerotic heart disease of native coronary artery without angina pectoris: Secondary | ICD-10-CM | POA: Diagnosis not present

## 2021-01-12 DIAGNOSIS — N401 Enlarged prostate with lower urinary tract symptoms: Secondary | ICD-10-CM | POA: Diagnosis not present

## 2021-01-12 DIAGNOSIS — Z48816 Encounter for surgical aftercare following surgery on the genitourinary system: Secondary | ICD-10-CM | POA: Diagnosis not present

## 2021-01-12 DIAGNOSIS — J449 Chronic obstructive pulmonary disease, unspecified: Secondary | ICD-10-CM | POA: Diagnosis not present

## 2021-01-12 DIAGNOSIS — N138 Other obstructive and reflux uropathy: Secondary | ICD-10-CM | POA: Diagnosis not present

## 2021-01-12 DIAGNOSIS — R338 Other retention of urine: Secondary | ICD-10-CM | POA: Diagnosis not present

## 2021-01-12 DIAGNOSIS — I509 Heart failure, unspecified: Secondary | ICD-10-CM | POA: Diagnosis not present

## 2021-01-12 DIAGNOSIS — I110 Hypertensive heart disease with heart failure: Secondary | ICD-10-CM | POA: Diagnosis not present

## 2021-01-12 DIAGNOSIS — N130 Hydronephrosis with ureteropelvic junction obstruction: Secondary | ICD-10-CM | POA: Diagnosis not present

## 2021-01-13 DIAGNOSIS — M6281 Muscle weakness (generalized): Secondary | ICD-10-CM | POA: Diagnosis not present

## 2021-01-14 ENCOUNTER — Other Ambulatory Visit (HOSPITAL_COMMUNITY): Payer: Self-pay | Admitting: Internal Medicine

## 2021-01-14 ENCOUNTER — Ambulatory Visit (HOSPITAL_COMMUNITY)
Admission: RE | Admit: 2021-01-14 | Discharge: 2021-01-14 | Disposition: A | Discharge status: Home / Self Care | Payer: Medicare PPO | Source: Ambulatory Visit | Attending: Internal Medicine | Admitting: Internal Medicine

## 2021-01-14 ENCOUNTER — Other Ambulatory Visit: Payer: Self-pay

## 2021-01-14 DIAGNOSIS — M7989 Other specified soft tissue disorders: Secondary | ICD-10-CM | POA: Diagnosis not present

## 2021-01-14 DIAGNOSIS — R059 Cough, unspecified: Secondary | ICD-10-CM | POA: Diagnosis not present

## 2021-01-14 DIAGNOSIS — N1831 Chronic kidney disease, stage 3a: Secondary | ICD-10-CM | POA: Diagnosis not present

## 2021-01-14 DIAGNOSIS — I10 Essential (primary) hypertension: Secondary | ICD-10-CM | POA: Diagnosis not present

## 2021-01-14 DIAGNOSIS — E1159 Type 2 diabetes mellitus with other circulatory complications: Secondary | ICD-10-CM | POA: Diagnosis not present

## 2021-01-15 DIAGNOSIS — I509 Heart failure, unspecified: Secondary | ICD-10-CM | POA: Diagnosis not present

## 2021-01-15 DIAGNOSIS — N401 Enlarged prostate with lower urinary tract symptoms: Secondary | ICD-10-CM | POA: Diagnosis not present

## 2021-01-15 DIAGNOSIS — R338 Other retention of urine: Secondary | ICD-10-CM | POA: Diagnosis not present

## 2021-01-15 DIAGNOSIS — N130 Hydronephrosis with ureteropelvic junction obstruction: Secondary | ICD-10-CM | POA: Diagnosis not present

## 2021-01-15 DIAGNOSIS — N138 Other obstructive and reflux uropathy: Secondary | ICD-10-CM | POA: Diagnosis not present

## 2021-01-15 DIAGNOSIS — I110 Hypertensive heart disease with heart failure: Secondary | ICD-10-CM | POA: Diagnosis not present

## 2021-01-15 DIAGNOSIS — I2510 Atherosclerotic heart disease of native coronary artery without angina pectoris: Secondary | ICD-10-CM | POA: Diagnosis not present

## 2021-01-15 DIAGNOSIS — J449 Chronic obstructive pulmonary disease, unspecified: Secondary | ICD-10-CM | POA: Diagnosis not present

## 2021-01-15 DIAGNOSIS — Z48816 Encounter for surgical aftercare following surgery on the genitourinary system: Secondary | ICD-10-CM | POA: Diagnosis not present

## 2021-01-18 DIAGNOSIS — N138 Other obstructive and reflux uropathy: Secondary | ICD-10-CM | POA: Diagnosis not present

## 2021-01-18 DIAGNOSIS — I509 Heart failure, unspecified: Secondary | ICD-10-CM | POA: Diagnosis not present

## 2021-01-18 DIAGNOSIS — N130 Hydronephrosis with ureteropelvic junction obstruction: Secondary | ICD-10-CM | POA: Diagnosis not present

## 2021-01-18 DIAGNOSIS — J449 Chronic obstructive pulmonary disease, unspecified: Secondary | ICD-10-CM | POA: Diagnosis not present

## 2021-01-18 DIAGNOSIS — N401 Enlarged prostate with lower urinary tract symptoms: Secondary | ICD-10-CM | POA: Diagnosis not present

## 2021-01-18 DIAGNOSIS — I110 Hypertensive heart disease with heart failure: Secondary | ICD-10-CM | POA: Diagnosis not present

## 2021-01-18 DIAGNOSIS — I2510 Atherosclerotic heart disease of native coronary artery without angina pectoris: Secondary | ICD-10-CM | POA: Diagnosis not present

## 2021-01-18 DIAGNOSIS — R338 Other retention of urine: Secondary | ICD-10-CM | POA: Diagnosis not present

## 2021-01-18 DIAGNOSIS — Z48816 Encounter for surgical aftercare following surgery on the genitourinary system: Secondary | ICD-10-CM | POA: Diagnosis not present

## 2021-01-19 DIAGNOSIS — I509 Heart failure, unspecified: Secondary | ICD-10-CM | POA: Diagnosis not present

## 2021-01-19 DIAGNOSIS — R338 Other retention of urine: Secondary | ICD-10-CM | POA: Diagnosis not present

## 2021-01-19 DIAGNOSIS — Z48816 Encounter for surgical aftercare following surgery on the genitourinary system: Secondary | ICD-10-CM | POA: Diagnosis not present

## 2021-01-19 DIAGNOSIS — J449 Chronic obstructive pulmonary disease, unspecified: Secondary | ICD-10-CM | POA: Diagnosis not present

## 2021-01-19 DIAGNOSIS — N401 Enlarged prostate with lower urinary tract symptoms: Secondary | ICD-10-CM | POA: Diagnosis not present

## 2021-01-19 DIAGNOSIS — I2510 Atherosclerotic heart disease of native coronary artery without angina pectoris: Secondary | ICD-10-CM | POA: Diagnosis not present

## 2021-01-19 DIAGNOSIS — N130 Hydronephrosis with ureteropelvic junction obstruction: Secondary | ICD-10-CM | POA: Diagnosis not present

## 2021-01-19 DIAGNOSIS — N138 Other obstructive and reflux uropathy: Secondary | ICD-10-CM | POA: Diagnosis not present

## 2021-01-19 DIAGNOSIS — I110 Hypertensive heart disease with heart failure: Secondary | ICD-10-CM | POA: Diagnosis not present

## 2021-01-22 DIAGNOSIS — N130 Hydronephrosis with ureteropelvic junction obstruction: Secondary | ICD-10-CM | POA: Diagnosis not present

## 2021-01-22 DIAGNOSIS — Z48816 Encounter for surgical aftercare following surgery on the genitourinary system: Secondary | ICD-10-CM | POA: Diagnosis not present

## 2021-01-22 DIAGNOSIS — I110 Hypertensive heart disease with heart failure: Secondary | ICD-10-CM | POA: Diagnosis not present

## 2021-01-22 DIAGNOSIS — J449 Chronic obstructive pulmonary disease, unspecified: Secondary | ICD-10-CM | POA: Diagnosis not present

## 2021-01-22 DIAGNOSIS — I2510 Atherosclerotic heart disease of native coronary artery without angina pectoris: Secondary | ICD-10-CM | POA: Diagnosis not present

## 2021-01-22 DIAGNOSIS — I509 Heart failure, unspecified: Secondary | ICD-10-CM | POA: Diagnosis not present

## 2021-01-22 DIAGNOSIS — N401 Enlarged prostate with lower urinary tract symptoms: Secondary | ICD-10-CM | POA: Diagnosis not present

## 2021-01-22 DIAGNOSIS — R338 Other retention of urine: Secondary | ICD-10-CM | POA: Diagnosis not present

## 2021-01-22 DIAGNOSIS — N138 Other obstructive and reflux uropathy: Secondary | ICD-10-CM | POA: Diagnosis not present

## 2021-01-25 ENCOUNTER — Ambulatory Visit: Payer: Medicare PPO | Admitting: Podiatry

## 2021-01-25 DIAGNOSIS — J449 Chronic obstructive pulmonary disease, unspecified: Secondary | ICD-10-CM | POA: Diagnosis not present

## 2021-01-27 DIAGNOSIS — E1159 Type 2 diabetes mellitus with other circulatory complications: Secondary | ICD-10-CM | POA: Diagnosis not present

## 2021-01-27 DIAGNOSIS — N1831 Chronic kidney disease, stage 3a: Secondary | ICD-10-CM | POA: Diagnosis not present

## 2021-01-27 DIAGNOSIS — I129 Hypertensive chronic kidney disease with stage 1 through stage 4 chronic kidney disease, or unspecified chronic kidney disease: Secondary | ICD-10-CM | POA: Diagnosis not present

## 2021-01-27 DIAGNOSIS — R600 Localized edema: Secondary | ICD-10-CM | POA: Diagnosis not present

## 2021-01-27 DIAGNOSIS — F17200 Nicotine dependence, unspecified, uncomplicated: Secondary | ICD-10-CM | POA: Diagnosis not present

## 2021-01-27 DIAGNOSIS — I872 Venous insufficiency (chronic) (peripheral): Secondary | ICD-10-CM | POA: Diagnosis not present

## 2021-02-08 NOTE — Assessment & Plan Note (Signed)
Benefits from CPAP with good compliance and control Plan- continue auto 5-20 

## 2021-02-08 NOTE — Assessment & Plan Note (Signed)
No exacerbation, but with ongoing smoking this will only get worse Plan- CXR

## 2021-02-08 NOTE — Assessment & Plan Note (Signed)
Despite repeated discussions, he is not prepared to try to quit

## 2021-02-26 DIAGNOSIS — N1339 Other hydronephrosis: Secondary | ICD-10-CM | POA: Diagnosis not present

## 2021-03-08 DIAGNOSIS — R3912 Poor urinary stream: Secondary | ICD-10-CM | POA: Diagnosis not present

## 2021-03-08 DIAGNOSIS — N130 Hydronephrosis with ureteropelvic junction obstruction: Secondary | ICD-10-CM | POA: Diagnosis not present

## 2021-03-08 DIAGNOSIS — R8279 Other abnormal findings on microbiological examination of urine: Secondary | ICD-10-CM | POA: Diagnosis not present

## 2021-03-08 DIAGNOSIS — R350 Frequency of micturition: Secondary | ICD-10-CM | POA: Diagnosis not present

## 2021-03-08 DIAGNOSIS — N401 Enlarged prostate with lower urinary tract symptoms: Secondary | ICD-10-CM | POA: Diagnosis not present

## 2021-03-19 ENCOUNTER — Ambulatory Visit (INDEPENDENT_AMBULATORY_CARE_PROVIDER_SITE_OTHER): Payer: Medicare PPO | Admitting: Podiatry

## 2021-03-19 ENCOUNTER — Other Ambulatory Visit: Payer: Self-pay

## 2021-03-19 DIAGNOSIS — B351 Tinea unguium: Secondary | ICD-10-CM

## 2021-03-19 DIAGNOSIS — M79676 Pain in unspecified toe(s): Secondary | ICD-10-CM

## 2021-03-26 ENCOUNTER — Encounter: Payer: Self-pay | Admitting: Podiatry

## 2021-03-26 NOTE — Progress Notes (Signed)
  Subjective:  Patient ID: [name], male    DOB: 07-25-1946,  MRN: [id number]  Dean [name]. presents to clinic today  for at risk foot care. Patient has h/o PAD and painful thick toenails that are difficult to trim. Pain interferes with ambulation. Aggravating factors include wearing enclosed shoe gear. Pain is relieved with periodic professional debridement..  75 y.o. male presents with the above complaint.  He states he may have to have his kidney removed. He voices no new pedal problems on today's visit.  Review of Systems: Negative except as noted in the HPI.  PCP is Dr. [name] and last visit was less than one month ago.  Allergies  Allergen Reactions  . Penicillins     Childhood reaction  REACTION: rapid pulse Did it involve swelling of the face/tongue/throat, SOB, or low BP? Yes Did it involve sudden or severe rash/hives, skin peeling, or any reaction on the inside of your mouth or nose? Unknown Did you need to seek medical attention at a hospital or doctor's office? yes When did it last happen?childhood  If all above answers are "NO", may proceed with cephalosporin use.   . Other     Oily seafood salmon-- chest pain  . Mold Extract [Trichophyton] Rash   Objective:   Constitutional Dean [name]. is a pleasant 75 y.o. Caucasian male, WD, WN in NAD. AAO x 3.   Vascular Dorsalis pedis pulses palpable bilaterally. Posterior tibial pulses palpable bilaterally.Capillary refill normal to all digits. No cyanosis or clubbing noted. Pedal hair growth normal b/l. Lower extremity skin temperature gradient within normal limits.  Neurologic Normal speech. Oriented to person, place, and time. Epicritic sensation to light touch grossly present bilaterally. Protective sensation intact 5/5 intact bilaterally with 10g monofilament b/l. Vibratory sensation intact b/l.   Dermatologic  Pedal skin with normal turgor, texture and tone bilaterally. No open  wounds bilaterally. No interdigital macerations bilaterally. Toenails 2-5 bilaterally elongated, discolored, dystrophic, thickened, and crumbly with subungual debris and tenderness to dorsal palpation. Anonychia noted L hallux and R hallux. Nailbed(s) epithelialized.   Orthopedic: Normal muscle strength 5/5 to all lower extremity muscle groups bilaterally. No pain crepitus or joint limitation noted with ROM b/l. Hallux valgus with bunion deformity noted b/l lower extremities. No bony tenderness.    Radiographs: None Assessment:   1. Pain due to onychomycosis of toenail    Plan:  Patient was evaluated and treated and all questions answered.  Onychomycosis with pain -Nails palliatively debridement as below -Educated on self-care  Procedure: Nail Debridement Rationale: Pain Type of Debridement: manual, sharp debridement. Instrumentation: Nail nipper, rotary burr. Number of Nails: 8 -Examined patient. -Toenails 2-5 b/l were debrided in length and girth with sterile nail nippers and dremel without iatrogenic bleeding. Bilateral hallux nailplates gently debrided from the remaining attachments to digits. Nailbeds cleansed with alcohol. Triple antibiotic ointment applied. No further treatment required. -Patient to report any pedal injuries to medical professional immediately. -Patient to continue soft, supportive shoe gear daily. -Patient/POA to call should there be question/concern in the interim.  Return in about 3 months (around 06/18/2021).  [name], DPM

## 2021-04-17 ENCOUNTER — Other Ambulatory Visit: Payer: Self-pay | Admitting: Internal Medicine

## 2021-04-23 ENCOUNTER — Other Ambulatory Visit: Payer: Self-pay | Admitting: Internal Medicine

## 2021-05-31 DIAGNOSIS — H524 Presbyopia: Secondary | ICD-10-CM | POA: Diagnosis not present

## 2021-05-31 DIAGNOSIS — Z961 Presence of intraocular lens: Secondary | ICD-10-CM | POA: Diagnosis not present

## 2021-05-31 DIAGNOSIS — H26491 Other secondary cataract, right eye: Secondary | ICD-10-CM | POA: Diagnosis not present

## 2021-05-31 DIAGNOSIS — H52203 Unspecified astigmatism, bilateral: Secondary | ICD-10-CM | POA: Diagnosis not present

## 2021-06-02 DIAGNOSIS — J449 Chronic obstructive pulmonary disease, unspecified: Secondary | ICD-10-CM | POA: Diagnosis not present

## 2021-06-02 DIAGNOSIS — N1831 Chronic kidney disease, stage 3a: Secondary | ICD-10-CM | POA: Diagnosis not present

## 2021-06-02 DIAGNOSIS — E1159 Type 2 diabetes mellitus with other circulatory complications: Secondary | ICD-10-CM | POA: Diagnosis not present

## 2021-06-02 DIAGNOSIS — I129 Hypertensive chronic kidney disease with stage 1 through stage 4 chronic kidney disease, or unspecified chronic kidney disease: Secondary | ICD-10-CM | POA: Diagnosis not present

## 2021-06-02 DIAGNOSIS — I739 Peripheral vascular disease, unspecified: Secondary | ICD-10-CM | POA: Diagnosis not present

## 2021-06-02 DIAGNOSIS — K219 Gastro-esophageal reflux disease without esophagitis: Secondary | ICD-10-CM | POA: Diagnosis not present

## 2021-06-02 DIAGNOSIS — M48061 Spinal stenosis, lumbar region without neurogenic claudication: Secondary | ICD-10-CM | POA: Diagnosis not present

## 2021-06-02 DIAGNOSIS — I700 Atherosclerosis of aorta: Secondary | ICD-10-CM | POA: Diagnosis not present

## 2021-06-02 DIAGNOSIS — I872 Venous insufficiency (chronic) (peripheral): Secondary | ICD-10-CM | POA: Diagnosis not present

## 2021-06-27 NOTE — Progress Notes (Signed)
Patient ID: Dean [name], male    DOB: 09-13-1946, 75 y.o.   MRN: [id number]  HPI M  Smoker followed for COPD, obstructive sleep apnea, lung nodule, allergic rhinitis. NPSG 04/06/1999   AHI 87/hr, desaturation to 83%, body weight 185 pounds PFT- /04/2009-severe obstructive airways disease with insignificant response to bronchodilator. FVC 3.18/83%, FEV1 1.53/56%, ratio 0.48, TLC 86%, DLCO 51% Office Spirometry 03/20/2017-very severe obstructive airways disease. 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 Breathing feels baseline, considering his smoking. No acute exacerbation. Uses his inhalers CXR 11/23/20- 1V-  Bronchitic changes with questionable bibasilar atelectasis versus infiltrate, greater on RIGHT.  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 Download reviewed. Estimates at least 6 hours of sleep, but c/o being slepy a lot in day. Takes tramadol only occasionally with no other obvious sedatives. Discussed naps and trial of adderall Discussed start of smoking cessation program  here. Meds ok. Breathing about same w no acute issues.  CXR 12/28/20- IMPRESSION: No acute disease. Aortic Atherosclerosis (ICD10-I70.0) and Emphysema (ICD10-J43.9).   ROS-see HPI  + = positive Constitutional:  +   weight loss, night sweats, fevers, chills, f+atigue, lassitude. HEENT:   No-  headaches, difficulty swallowing, tooth/dental problems, sore throat,       No-  sneezing, itching, ear ache, +nasal congestion, post nasal drip,  CV:  No-   chest pain, orthopnea, PND, swelling in lower extremities, anasarca,  dizziness, palpitations Resp: + shortness of breath with exertion or at rest.           +   productive cough,  + non-productive cough,  No- coughing up of blood.              No-   change in color of mucus.  + wheezing.   Skin: No-   rash or lesions. GI:  No-   heartburn, indigestion, abdominal pain, nausea, vomiting, + diarrhea GU:  MS:  + joint pain or swelling, + back pain . Neuro-     nothing unusual Psych:  No- change in mood or affect. No depression or anxiety.  No memory loss.  Objective:  OBJ- Physical Exam General- Alert, Oriented, Affect-appropriate, Distress- none acute. + Odor of tobacco,  Skin- rash-none, lesions- none, excoriation- none Lymphadenopathy- none Head- atraumatic            Eyes- Gross vision intact, PERRLA, conjunctivae and secretions clear            Ears- Hearing, canals-normal            Nose- + Turbinate edema, no-Septal dev, mucus, polyps, erosion, perforation             Throat- Mallampati III , mucosa clear , drainage- none, tonsils-  atrophic Neck- flexible , trachea midline, no stridor , thyroid nl, carotid no bruit, + mass left submandibular Chest - symmetrical excursion , unlabored           Heart/CV- RRR , no murmur , no gallop  , no rub, nl s1 s2                           - JVD- none , edema- none, stasis changes- none, varices- none           Lung- + diminished, wheeze- none,  Cough+light dullness-none, rub- none           Chest  wall-  Abd-  Br/ Gen/ Rectal- Not done, not indicated Extrem- cyanosis- none, clubbing, none, atrophy- none, strength- nl + Cane Neuro- grossly intact to observation

## 2021-06-28 ENCOUNTER — Ambulatory Visit: Payer: Medicare PPO | Admitting: Internal Medicine

## 2021-06-28 ENCOUNTER — Encounter: Payer: Self-pay | Admitting: Internal Medicine

## 2021-06-28 ENCOUNTER — Telehealth: Payer: Self-pay | Admitting: Internal Medicine

## 2021-06-28 ENCOUNTER — Other Ambulatory Visit: Payer: Self-pay

## 2021-06-28 DIAGNOSIS — G4733 Obstructive sleep apnea (adult) (pediatric): Secondary | ICD-10-CM

## 2021-06-28 DIAGNOSIS — J449 Chronic obstructive pulmonary disease, unspecified: Secondary | ICD-10-CM

## 2021-06-28 DIAGNOSIS — R911 Solitary pulmonary nodule: Secondary | ICD-10-CM

## 2021-06-28 DIAGNOSIS — F17200 Nicotine dependence, unspecified, uncomplicated: Secondary | ICD-10-CM

## 2021-06-28 DIAGNOSIS — G4710 Hypersomnia, unspecified: Secondary | ICD-10-CM | POA: Diagnosis not present

## 2021-06-28 MED ORDER — AMPHETAMINE-DEXTROAMPHETAMINE 10 MG PO TABS: ORAL_TABLET | ORAL | 0 refills | Status: DC

## 2021-06-28 NOTE — Assessment & Plan Note (Signed)
Not seen on CXR 12/28/20

## 2021-06-28 NOTE — Patient Instructions (Signed)
Referral to smoking cessation program  Script sent to try adderall  10 mg, once or twice daily if needed for alertness  You can also take a short nap if needed  Continue routine meds  Please call if we can help

## 2021-06-28 NOTE — Telephone Encounter (Signed)
Pharmacist is on vacation until next week. Will route to her and she can follow up upon her return. Thanks!

## 2021-06-28 NOTE — Telephone Encounter (Signed)
Dr. Annamaria Boots would like to refer this pt for smoking cessation

## 2021-06-28 NOTE — Assessment & Plan Note (Signed)
Benefits with good compliance and control Plan- continue CPAP auto 5-20

## 2021-06-28 NOTE — Assessment & Plan Note (Signed)
Compliant with CPAP. Sleep quality may not be as good as he thinks. Plan- occ short naps. Try adderall 10 mg for occasional use

## 2021-06-28 NOTE — Assessment & Plan Note (Signed)
Willing to explore smoking cessation program through Pharmacy pool here

## 2021-06-28 NOTE — Assessment & Plan Note (Signed)
Meds adequate.  Plan- emphasis on smoking cessation

## 2021-06-29 ENCOUNTER — Ambulatory Visit: Payer: Medicare PPO | Admitting: Podiatry

## 2021-06-29 ENCOUNTER — Encounter: Payer: Self-pay | Admitting: Podiatry

## 2021-06-29 DIAGNOSIS — L84 Corns and callosities: Secondary | ICD-10-CM

## 2021-06-29 DIAGNOSIS — B351 Tinea unguium: Secondary | ICD-10-CM

## 2021-06-29 DIAGNOSIS — M79676 Pain in unspecified toe(s): Secondary | ICD-10-CM | POA: Diagnosis not present

## 2021-06-29 DIAGNOSIS — I739 Peripheral vascular disease, unspecified: Secondary | ICD-10-CM

## 2021-07-02 NOTE — Progress Notes (Signed)
Subjective: Dean [name]. is a pleasant 75 y.o. male patient seen today painful thick toenails that are difficult to trim. Pain interferes with ambulation. Aggravating factors include wearing enclosed shoe gear. Pain is relieved with periodic professional debridement.  Patient has h/o PAD and is s/p fem-fem bypass.  He voices no new pedal problems on today's visit.  PCP is [name], MD. Last visit was: July 2022.  Allergies  Allergen Reactions   Penicillins     Childhood reaction  REACTION: rapid pulse Did it involve swelling of the face/tongue/throat, SOB, or low BP? Yes Did it involve sudden or severe rash/hives, skin peeling, or any reaction on the inside of your mouth or nose? Unknown Did you need to seek medical attention at a hospital or doctor's office? yes When did it last happen?      childhood  If all above answers are "NO", may proceed with cephalosporin use.    Other     Oily seafood salmon-- chest pain   Mold Extract [Trichophyton] Rash    Objective: Physical Exam  General: Dean [name]. is a pleasant 75 y.o. Caucasian male, WD, WN in NAD. AAO x 3.   Vascular:  Capillary refill time to digits immediate b/l. Palpable pedal pulses b/l LE. Pedal hair present. Lower extremity skin temperature gradient within normal limits.  Dermatological:  Pedal skin with normal turgor, texture and tone b/l lower extremities No open wounds b/l lower extremities No interdigital macerations b/l lower extremities Toenails 2-5 bilaterally and R hallux elongated, discolored, dystrophic, thickened, and crumbly with subungual debris and tenderness to dorsal palpation. Anonychia noted L hallux. Nailbed(s) epithelialized.  Hyperkeratotic lesion(s) L hallux and submet head 1 left foot.  No erythema, no edema, no drainage, no fluctuance.  Musculoskeletal:  Normal muscle strength 5/5 to all lower extremity muscle groups bilaterally. No pain crepitus or joint limitation  noted with ROM b/l. Hallux valgus with bunion deformity noted b/l lower extremities.  Neurological:  Protective sensation intact 5/5 intact bilaterally with 10g monofilament b/l. Vibratory sensation intact b/l.  Assessment and Plan:  1. Pain due to onychomycosis of toenail   2. Callus   3. PAD (peripheral artery disease) ([place])     -Examined patient. -Patient to continue soft, supportive shoe gear daily. -Toenails 2-5 bilaterally and R hallux debrided in length and girth without iatrogenic bleeding with sterile nail nipper and dremel.  -Callus(es) L hallux and submet head 1 left foot pared utilizing sterile scalpel blade without complication or incident. Total number debrided =2. -Patient to report any pedal injuries to medical professional immediately. -Patient/POA to call should there be question/concern in the interim.  Return in about 3 months (around 09/29/2021).  [name], DPM

## 2021-08-05 DIAGNOSIS — Z96641 Presence of right artificial hip joint: Secondary | ICD-10-CM | POA: Diagnosis not present

## 2021-08-13 ENCOUNTER — Other Ambulatory Visit: Payer: Self-pay | Admitting: Internal Medicine

## 2021-09-03 ENCOUNTER — Telehealth: Payer: Self-pay | Admitting: Internal Medicine

## 2021-09-03 MED ORDER — DILTIAZEM HCL ER 120 MG PO CP24: 120.0000 mg | ORAL_CAPSULE | Freq: Every day | ORAL | 0 refills | Status: DC

## 2021-09-03 MED ORDER — DILTIAZEM HCL ER 120 MG PO CP24: 120.0000 mg | ORAL_CAPSULE | Freq: Every day | ORAL | 3 refills | Status: DC

## 2021-09-03 NOTE — Telephone Encounter (Signed)
*  STAT* If patient is at the pharmacy, call can be transferred to refill team.   1. Which medications need to be refilled? (please list name of each medication and dose if known) diltiazem (DILACOR XR) 120 MG 24 hr capsule  2. Which pharmacy/location (including street and city if local pharmacy) is medication to be sent to? [place], [place] - [address] AT [place]  3. Do they need a 30 day or 90 day supply? [place]

## 2021-09-06 DIAGNOSIS — N401 Enlarged prostate with lower urinary tract symptoms: Secondary | ICD-10-CM | POA: Diagnosis not present

## 2021-09-06 DIAGNOSIS — N130 Hydronephrosis with ureteropelvic junction obstruction: Secondary | ICD-10-CM | POA: Diagnosis not present

## 2021-09-06 DIAGNOSIS — R3912 Poor urinary stream: Secondary | ICD-10-CM | POA: Diagnosis not present

## 2021-09-06 DIAGNOSIS — N281 Cyst of kidney, acquired: Secondary | ICD-10-CM | POA: Diagnosis not present

## 2021-09-15 DIAGNOSIS — C4442 Squamous cell carcinoma of skin of scalp and neck: Secondary | ICD-10-CM | POA: Diagnosis not present

## 2021-09-20 ENCOUNTER — Telehealth: Payer: Self-pay

## 2021-09-20 NOTE — Telephone Encounter (Signed)
   Dean [name]. is a 75 y.o. male and has been referred to the pharmacist telephone-based smoking cessation service on 06/28/2021 by pulmonologist Dr. Annamaria Boots.  Patient request being contacted in two weeks as he is under increased stress. Discussed with patient how smoking cessation will benefit his overall health and encouraged patient to call my office number [phone]) if anything changes between now and two weeks; otherwise, I'll call him the week of 10/04/21.   Pauletta Browns, Pharm.D. PGY-1 Pharmacy Resident [phone] 09/20/2021 4:28 PM

## 2021-09-28 DIAGNOSIS — Z125 Encounter for screening for malignant neoplasm of prostate: Secondary | ICD-10-CM | POA: Diagnosis not present

## 2021-09-28 DIAGNOSIS — E1159 Type 2 diabetes mellitus with other circulatory complications: Secondary | ICD-10-CM | POA: Diagnosis not present

## 2021-09-28 DIAGNOSIS — I10 Essential (primary) hypertension: Secondary | ICD-10-CM | POA: Diagnosis not present

## 2021-09-29 ENCOUNTER — Other Ambulatory Visit: Payer: Self-pay

## 2021-09-29 ENCOUNTER — Ambulatory Visit: Payer: Medicare PPO | Admitting: Podiatry

## 2021-09-29 ENCOUNTER — Encounter: Payer: Self-pay | Admitting: Podiatry

## 2021-09-29 DIAGNOSIS — B351 Tinea unguium: Secondary | ICD-10-CM

## 2021-09-29 DIAGNOSIS — M79676 Pain in unspecified toe(s): Secondary | ICD-10-CM

## 2021-09-29 DIAGNOSIS — I739 Peripheral vascular disease, unspecified: Secondary | ICD-10-CM | POA: Diagnosis not present

## 2021-09-29 DIAGNOSIS — L84 Corns and callosities: Secondary | ICD-10-CM

## 2021-10-03 NOTE — Progress Notes (Signed)
Subjective: Dean [name]. is a pleasant 75 y.o. male patient seen today painful thick toenails that are difficult to trim. Pain interferes with ambulation. Aggravating factors include wearing enclosed shoe gear. Pain is relieved with periodic professional debridement.  Patient has h/o PAD and is s/p fem-fem bypass.  Dean [name] voices no new pedal problems on today's visit. He voices no new pedal problems on today's visit.  PCP is [name], MD. Last visit was: 09/28/2021.  Allergies  Allergen Reactions   Penicillins     Childhood reaction  REACTION: rapid pulse Did it involve swelling of the face/tongue/throat, SOB, or low BP? Yes Did it involve sudden or severe rash/hives, skin peeling, or any reaction on the inside of your mouth or nose? Unknown Did you need to seek medical attention at a hospital or doctor's office? yes When did it last happen?      childhood  If all above answers are "NO", may proceed with cephalosporin use.    Other     Oily seafood salmon-- chest pain   Mold Extract [Trichophyton] Rash    Objective: Physical Exam  General: Dean [name]. is a pleasant 75 y.o. Caucasian male, WD, WN in NAD. AAO x 3.   Vascular:  Capillary refill time to digits immediate b/l. Palpable pedal pulses b/l LE. Pedal hair present. Lower extremity skin temperature gradient within normal limits.  Dermatological:  Pedal skin with normal turgor, texture and tone b/l lower extremities No open wounds b/l lower extremities No interdigital macerations b/l lower extremities Toenails 2-5 bilaterally and R hallux elongated, discolored, dystrophic, thickened, and crumbly with subungual debris and tenderness to dorsal palpation. Anonychia noted L hallux. Nailbed(s) epithelialized.  Hyperkeratotic lesion(s) L hallux and submet head 1 left foot.  No erythema, no edema, no drainage, no fluctuance.  Musculoskeletal:  Normal muscle strength 5/5 to all lower extremity muscle  groups bilaterally. No pain crepitus or joint limitation noted with ROM b/l. Hallux valgus with bunion deformity noted b/l lower extremities.  Neurological:  Protective sensation intact 5/5 intact bilaterally with 10g monofilament b/l. Vibratory sensation intact b/l.  Assessment and Plan:  1. Pain due to onychomycosis of toenail   2. Callus   3. PAD (peripheral artery disease) ([facility])    -No new findings. No new orders. -Patient to continue soft, supportive shoe gear daily. -Toenails 2-5 bilaterally and R hallux debrided in length and girth without iatrogenic bleeding with sterile nail nipper and dremel.  -Callus(es) L hallux and submet head 1 left foot pared utilizing sterile scalpel blade without complication or incident. Total number debrided =2. -Patient to report any pedal injuries to medical professional immediately. -Patient/POA to call should there be question/concern in the interim.  Return in about 3 months (around 12/30/2021).  [name], DPM

## 2021-10-04 DIAGNOSIS — J449 Chronic obstructive pulmonary disease, unspecified: Secondary | ICD-10-CM | POA: Diagnosis not present

## 2021-10-04 DIAGNOSIS — Z1331 Encounter for screening for depression: Secondary | ICD-10-CM | POA: Diagnosis not present

## 2021-10-04 DIAGNOSIS — I700 Atherosclerosis of aorta: Secondary | ICD-10-CM | POA: Diagnosis not present

## 2021-10-04 DIAGNOSIS — Z0000 Encounter for general adult medical examination without abnormal findings: Secondary | ICD-10-CM | POA: Diagnosis not present

## 2021-10-04 DIAGNOSIS — N1831 Chronic kidney disease, stage 3a: Secondary | ICD-10-CM | POA: Diagnosis not present

## 2021-10-04 DIAGNOSIS — M48061 Spinal stenosis, lumbar region without neurogenic claudication: Secondary | ICD-10-CM | POA: Diagnosis not present

## 2021-10-04 DIAGNOSIS — E785 Hyperlipidemia, unspecified: Secondary | ICD-10-CM | POA: Diagnosis not present

## 2021-10-04 DIAGNOSIS — I872 Venous insufficiency (chronic) (peripheral): Secondary | ICD-10-CM | POA: Diagnosis not present

## 2021-10-04 DIAGNOSIS — E1159 Type 2 diabetes mellitus with other circulatory complications: Secondary | ICD-10-CM | POA: Diagnosis not present

## 2021-10-06 DIAGNOSIS — Z85828 Personal history of other malignant neoplasm of skin: Secondary | ICD-10-CM | POA: Diagnosis not present

## 2021-10-06 DIAGNOSIS — C4442 Squamous cell carcinoma of skin of scalp and neck: Secondary | ICD-10-CM | POA: Diagnosis not present

## 2021-10-16 DIAGNOSIS — Z23 Encounter for immunization: Secondary | ICD-10-CM | POA: Diagnosis not present

## 2021-10-23 IMAGING — RF DG HIP (WITH PELVIS) OPERATIVE*R*
1 series · 2 of 2 positions shown · non-contrast
Comparison: None.

CLINICAL DATA: Right hip arthroplasty

EXAM:
OPERATIVE right HIP (WITH PELVIS IF PERFORMED) 2 VIEWS
TECHNIQUE: Fluoroscopic spot image(s) were submitted for interpretation
post-operatively.
Radiation exposure index: 1.1371 Gy.

[Series 1: unknown protocol · 2 of 2 slices shown]
[im 1/2]
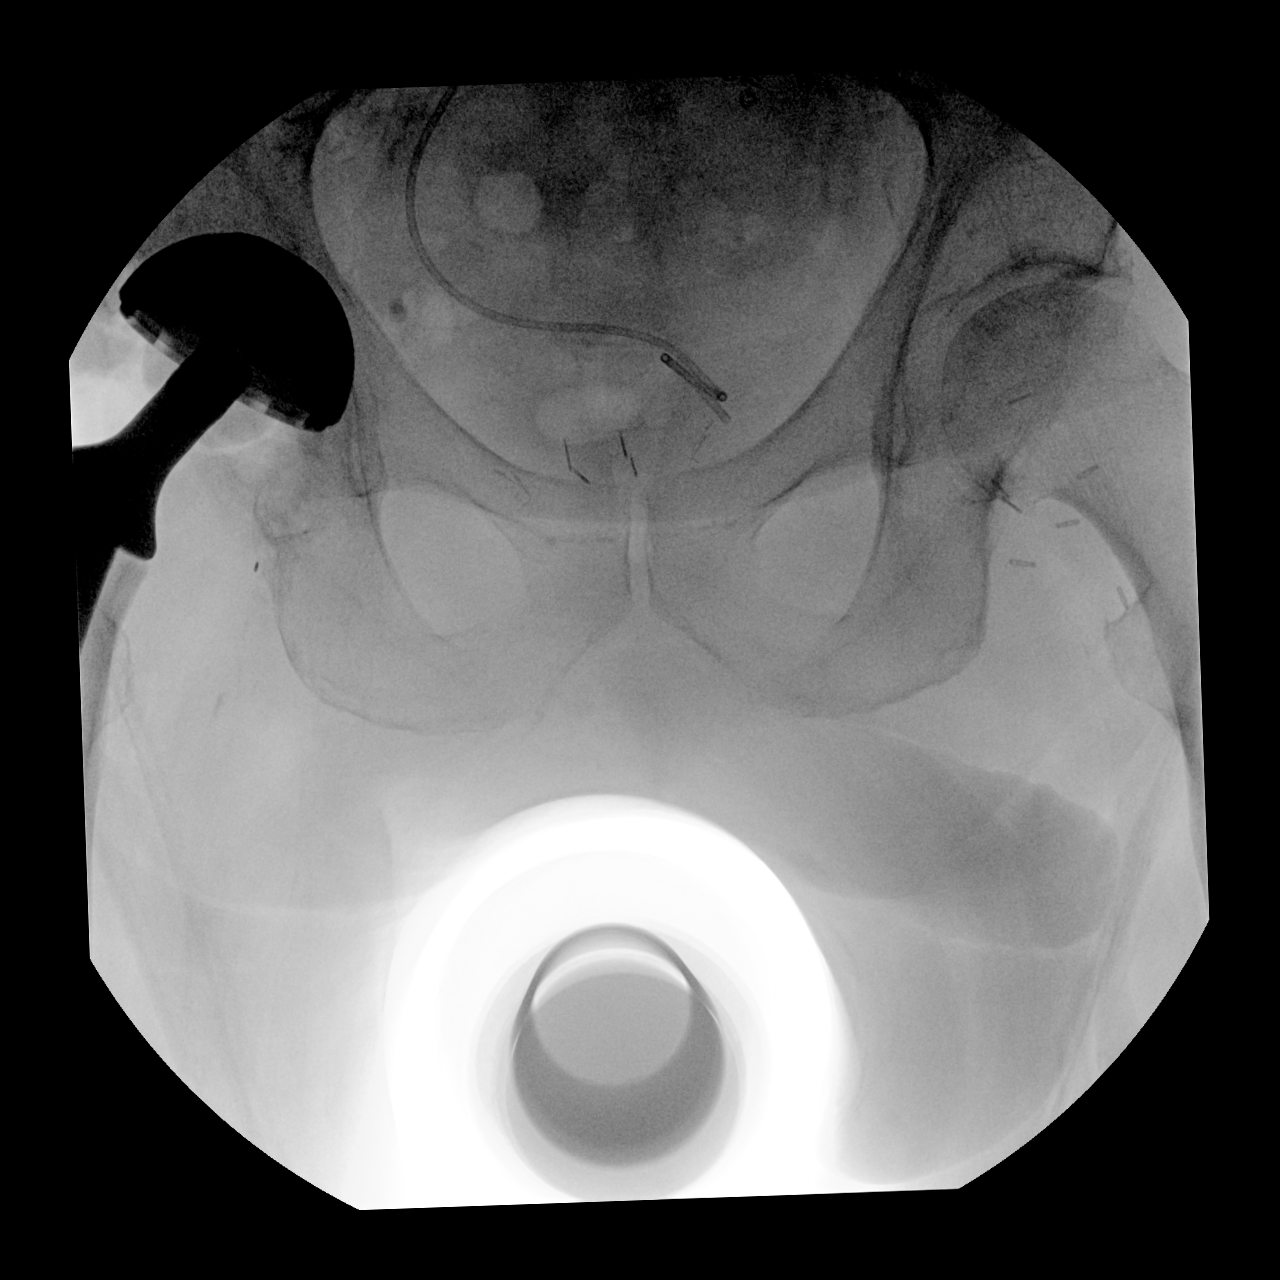
[im 2/2]
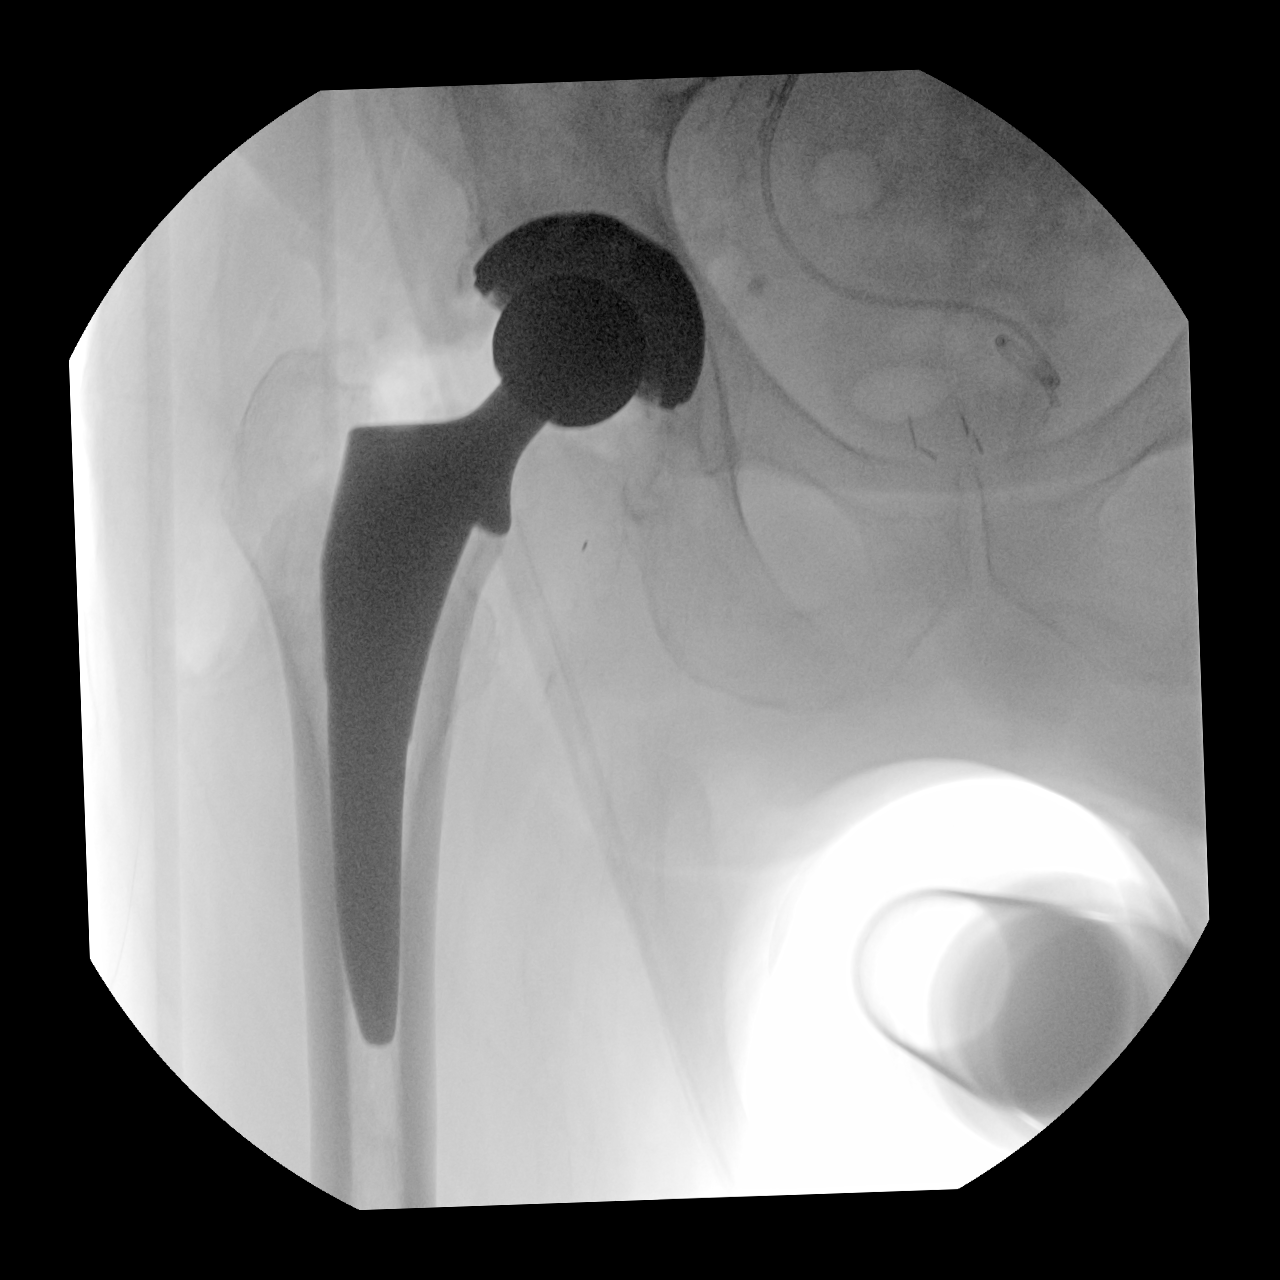

[2 of 2 positions shown; findings below may reference images not displayed]

FINDINGS: Two intraoperative fluoroscopic images of the right hip demonstrate
the right acetabular and femoral components to be well situated.
IMPRESSION: Status post right total hip arthroplasty.

## 2021-11-18 DIAGNOSIS — J8410 Pulmonary fibrosis, unspecified: Secondary | ICD-10-CM | POA: Diagnosis not present

## 2021-11-18 DIAGNOSIS — I739 Peripheral vascular disease, unspecified: Secondary | ICD-10-CM | POA: Diagnosis not present

## 2021-11-18 DIAGNOSIS — I2510 Atherosclerotic heart disease of native coronary artery without angina pectoris: Secondary | ICD-10-CM | POA: Diagnosis not present

## 2021-11-18 DIAGNOSIS — R600 Localized edema: Secondary | ICD-10-CM | POA: Diagnosis not present

## 2021-11-18 DIAGNOSIS — I129 Hypertensive chronic kidney disease with stage 1 through stage 4 chronic kidney disease, or unspecified chronic kidney disease: Secondary | ICD-10-CM | POA: Diagnosis not present

## 2021-11-18 DIAGNOSIS — R0602 Shortness of breath: Secondary | ICD-10-CM | POA: Diagnosis not present

## 2021-11-18 DIAGNOSIS — J449 Chronic obstructive pulmonary disease, unspecified: Secondary | ICD-10-CM | POA: Diagnosis not present

## 2021-11-18 DIAGNOSIS — F17200 Nicotine dependence, unspecified, uncomplicated: Secondary | ICD-10-CM | POA: Diagnosis not present

## 2021-11-18 DIAGNOSIS — E871 Hypo-osmolality and hyponatremia: Secondary | ICD-10-CM | POA: Diagnosis not present

## 2021-12-06 DIAGNOSIS — R0602 Shortness of breath: Secondary | ICD-10-CM | POA: Diagnosis not present

## 2021-12-06 DIAGNOSIS — I129 Hypertensive chronic kidney disease with stage 1 through stage 4 chronic kidney disease, or unspecified chronic kidney disease: Secondary | ICD-10-CM | POA: Diagnosis not present

## 2021-12-06 DIAGNOSIS — J449 Chronic obstructive pulmonary disease, unspecified: Secondary | ICD-10-CM | POA: Diagnosis not present

## 2021-12-06 DIAGNOSIS — E871 Hypo-osmolality and hyponatremia: Secondary | ICD-10-CM | POA: Diagnosis not present

## 2021-12-06 DIAGNOSIS — F17200 Nicotine dependence, unspecified, uncomplicated: Secondary | ICD-10-CM | POA: Diagnosis not present

## 2021-12-06 DIAGNOSIS — I739 Peripheral vascular disease, unspecified: Secondary | ICD-10-CM | POA: Diagnosis not present

## 2021-12-06 DIAGNOSIS — I2510 Atherosclerotic heart disease of native coronary artery without angina pectoris: Secondary | ICD-10-CM | POA: Diagnosis not present

## 2021-12-06 DIAGNOSIS — I5032 Chronic diastolic (congestive) heart failure: Secondary | ICD-10-CM | POA: Diagnosis not present

## 2021-12-06 DIAGNOSIS — J8410 Pulmonary fibrosis, unspecified: Secondary | ICD-10-CM | POA: Diagnosis not present

## 2021-12-29 NOTE — Progress Notes (Signed)
Patient ID: Dean [name], male    DOB: Aug 19, 1946, 76 y.o.   MRN: [id number]  HPI M  Smoker followed for COPD, obstructive sleep apnea, lung nodule, allergic rhinitis. NPSG 04/06/1999   AHI 87/hr, desaturation to 83%, body weight 185 pounds PFT- /04/2009-severe obstructive airways disease with insignificant response to bronchodilator. FVC 3.18/83%, FEV1 1.53/56%, ratio 0.48, TLC 86%, DLCO 51% Office Spirometry 03/20/2017-very severe obstructive airways disease. FVC 1.64/46%, FEV1 0.73/28%, ratio 0.45 ----------------------------------------------------------------------------------------------   06/28/21- 15 yoM  Smoker (51 pkyrs) followed for COPD, allergic rhinitis, , Obstructive Sleep Apnea, lung nodule, allergic rhinitis., complicated by PAOD, HBP, CAD, GERD, Parotid Mass (Dr Rosen/ ENT) -Proventil hfa, neb albuterol, Dymista nasal CPAP auto 5-20/ [facility] Download-compliance 97%, AHI 0.9/ hr Body weight today-156 lbs Covid vax- -----No c/o, unchanged SOB/ cough/ wheezing  Ok with CPAP. Download reviewed. Estimates at least 6 hours of sleep, but c/o being slepy a lot in day. Takes tramadol only occasionally with no other obvious sedatives. Discussed naps and trial of adderall Discussed start of smoking cessation program here. Meds ok. Breathing about same w no acute issues.  CXR 12/28/20- IMPRESSION: No acute disease. Aortic Atherosclerosis (ICD10-I70.0) and Emphysema (ICD10-J43.9).  12/30/21- 74 yoM  Smoker (51 pkyrs) followed for COPD, allergic rhinitis, , Obstructive Sleep Apnea, lung nodule, allergic rhinitis., complicated by PAOD, HBP, CAD, GERD, Parotid Mass (Dr Rosen/ ENT) -Proventil hfa, neb albuterol, Dymista nasal, Adderall 10 bid,  CPAP auto 5-20/ [facility] Download-compliance 100%, AHI 0.5/ hr Body weight today- Covid vax-4 Phizer Flu vax-had -----Patient has no concerns at this time, feels like his breathing is about the same as last visit. He tells me he feels "bad all  over", breathing not good.  He has a cleaning person come to his home who uses cleansers with strong odors that are irritating.  Weather changes also make a difference.  No acute infection.  Variable cough but phlegm remains white with no blood.  He denies adenopathy or chest pain. 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  Cycles of diarrhea and constipation attributed to IBS. CXR-brings disc from Dr. Jacquiline Doe office.  To my read it shows COPD with overinflation and interstitial thickening but no acute process.  ROS-see HPI  + = positive Constitutional:  +   weight loss, night sweats, fevers, chills, f+atigue, lassitude. HEENT:   No-  headaches, difficulty swallowing, tooth/dental problems, sore throat,       No-  sneezing, itching, ear ache, +nasal congestion, post nasal drip,  CV:  No-   chest pain, orthopnea, PND, swelling in lower extremities, anasarca,  dizziness, palpitations Resp: + shortness of breath with exertion or at rest.           +   productive cough,  + non-productive cough,  No- coughing up of blood.              No-   change in color of mucus.  + wheezing.   Skin: No-   rash or lesions. GI:  No-   heartburn, indigestion, abdominal pain, nausea, vomiting, + diarrhea GU:  MS:  + joint pain or swelling, + back pain . Neuro-     nothing unusual Psych:  No- change in mood or affect. No depression or anxiety.  No memory loss.  Objective:  OBJ- Physical Exam General- Alert, Oriented, Affect-appropriate, Distress- none acute.  Skin- rash-none, lesions- none, excoriation- none Lymphadenopathy- none Head- atraumatic            Eyes- Gross vision  intact, PERRLA, conjunctivae and secretions clear            Ears- Hearing, canals-normal            Nose- + Turbinate edema, no-Septal dev, mucus, polyps, erosion, perforation             Throat- Mallampati III , mucosa clear , drainage- none, tonsils- atrophic Neck- flexible , trachea midline, no stridor , thyroid nl, carotid no bruit, +  mass left submandibular is less evident Chest - symmetrical excursion , unlabored           Heart/CV- RRR , no murmur , no gallop  , no rub, nl s1 s2                           - JVD- none , edema- none, stasis changes- none, varices- none           Lung- + diminished/few crackles, wheeze- none,  Cough+light, dullness-none, rub- none           Chest wall-  Abd-  Br/ Gen/ Rectal- Not done, not indicated Extrem- cyanosis- none, clubbing, none, atrophy- none, strength- nl, + rolling walker Neuro- grossly intact to observation

## 2021-12-30 ENCOUNTER — Ambulatory Visit: Payer: Medicare PPO | Admitting: Internal Medicine

## 2021-12-30 ENCOUNTER — Other Ambulatory Visit: Payer: Self-pay

## 2021-12-30 ENCOUNTER — Encounter: Payer: Self-pay | Admitting: Internal Medicine

## 2021-12-30 DIAGNOSIS — G4733 Obstructive sleep apnea (adult) (pediatric): Secondary | ICD-10-CM

## 2021-12-30 DIAGNOSIS — F17200 Nicotine dependence, unspecified, uncomplicated: Secondary | ICD-10-CM | POA: Diagnosis not present

## 2021-12-30 DIAGNOSIS — J449 Chronic obstructive pulmonary disease, unspecified: Secondary | ICD-10-CM | POA: Diagnosis not present

## 2021-12-30 MED ORDER — ANORO ELLIPTA 62.5-25 MCG/ACT IN AEPB: 1.0000 | INHALATION_SPRAY | Freq: Every day | RESPIRATORY_TRACT | 0 refills | Status: DC

## 2021-12-30 NOTE — Patient Instructions (Addendum)
Thank you for bringing the disk with your chest xray on it..  Order- sample x 2 Anoro inhaler     inhale 1 puff, once daily.  See if this helps your breathing. Please contact the pharmacy team to talk again about smoking cessation. You said you have the number, and this is important.   Please call if I can help

## 2021-12-31 ENCOUNTER — Encounter: Payer: Self-pay | Admitting: Internal Medicine

## 2021-12-31 NOTE — Assessment & Plan Note (Signed)
Excellent compliance and control.  He continues to benefit. Plan-continue auto 10-20

## 2021-12-31 NOTE — Assessment & Plan Note (Signed)
Significant COPD with out acute exacerbation. Plan-samples of Anoro for trial with discussion

## 2021-12-31 NOTE — Assessment & Plan Note (Signed)
He has the contact information for the pharmacy smoking cessation program and indicates willingness to reach out and establish that.  I strongly encouraged him to do so.

## 2022-01-06 ENCOUNTER — Telehealth: Payer: Self-pay | Admitting: Internal Medicine

## 2022-01-06 MED ORDER — ANORO ELLIPTA 62.5-25 MCG/ACT IN AEPB: 1.0000 | INHALATION_SPRAY | Freq: Every day | RESPIRATORY_TRACT | 5 refills | Status: DC

## 2022-01-06 NOTE — Telephone Encounter (Signed)
Rx for pt's anoro inhaler has been sent to preferred pharmacy for pt. Called and spoke with pt letting him know this had been done and he verbalized understanding. Nothing further needed.

## 2022-01-11 ENCOUNTER — Ambulatory Visit: Payer: Medicare PPO | Admitting: Podiatry

## 2022-01-11 ENCOUNTER — Other Ambulatory Visit: Payer: Self-pay

## 2022-01-11 ENCOUNTER — Other Ambulatory Visit (HOSPITAL_COMMUNITY): Payer: Self-pay

## 2022-01-11 ENCOUNTER — Encounter: Payer: Self-pay | Admitting: Podiatry

## 2022-01-11 ENCOUNTER — Telehealth: Payer: Self-pay | Admitting: Pharmacy Technician

## 2022-01-11 DIAGNOSIS — M79676 Pain in unspecified toe(s): Secondary | ICD-10-CM | POA: Diagnosis not present

## 2022-01-11 DIAGNOSIS — I739 Peripheral vascular disease, unspecified: Secondary | ICD-10-CM | POA: Diagnosis not present

## 2022-01-11 DIAGNOSIS — B351 Tinea unguium: Secondary | ICD-10-CM

## 2022-01-11 DIAGNOSIS — L84 Corns and callosities: Secondary | ICD-10-CM

## 2022-01-11 MED ORDER — STIOLTO RESPIMAT 2.5-2.5 MCG/ACT IN AERS: 2.0000 | INHALATION_SPRAY | Freq: Every day | RESPIRATORY_TRACT | 12 refills | Status: DC

## 2022-01-11 NOTE — Telephone Encounter (Signed)
Patient Advocate Encounter   Received notification from CoverMyMeds that prior authorization for Anoro is required by his/her insurance [employer].  Per Test Claim: Dean [name] or Stiolto is preferred. Dean [name] is $64 copay, Stiolto is $40.

## 2022-01-11 NOTE — Addendum Note (Signed)
Addended by: Elton Sin on: 01/11/2022 04:37 PM   Modules accepted: Orders

## 2022-01-11 NOTE — Telephone Encounter (Signed)
Called and spoke with patient.  Dr. Janee Morn recommendations given. Understanding stated.  Stiolto prescription sent to pharmacy.  Nothing further at this time.

## 2022-01-11 NOTE — Telephone Encounter (Signed)
Please let patient know-     DC Anoro and replace with Stiolto 2.5, # 1   inhale 2 puffs daily   ref x 12

## 2022-01-18 NOTE — Progress Notes (Signed)
°  Subjective:  Patient ID: Dean Filter., male    DOB: 01-23-1946,  MRN: [id number]  Subjective:  is a pleasant 76 y.o. male patient seen today painful thick toenails that are difficult to trim. Pain interferes with ambulation. Aggravating factors include wearing enclosed shoe gear. Pain is relieved with periodic professional debridement.  Patient has h/o PAD and is s/p fem-fem bypass.  Dean [name] voices no new pedal problems on today's visit. He voices no new pedal problems on today's visit.  PCP is [name], MD. Last visit was: 09/28/2021.  Allergies  Allergen Reactions   Penicillins     Childhood reaction  REACTION: rapid pulse Did it involve swelling of the face/tongue/throat, SOB, or low BP? Yes Did it involve sudden or severe rash/hives, skin peeling, or any reaction on the inside of your mouth or nose? Unknown Did you need to seek medical attention at a hospital or doctor's office? yes When did it last happen?      childhood  If all above answers are "NO", may proceed with cephalosporin use.    Fish Oil     Other reaction(s): Heart attack symptoms   Other     Oily seafood salmon-- chest pain   Penicillin G     Other reaction(s): Unknown   Mold Extract [Trichophyton] Rash   Review of Systems: Negative except as noted in the HPI.  Objective: Physical Exam  General: Dean [name]. is a pleasant 76 y.o. Caucasian male, WD, WN in NAD. AAO x 3.   Vascular:  CFT <4 seconds b/l LE. Faintly palpable DP pulses b/l LE. Diminished PT pulse(s) b/l LE. Lower extremity skin temperature gradient within normal limits. No ischemia or gangrene noted b/l LE. No cyanosis or clubbing noted b/l LE.  Dermatological:  Pedal skin thin and atrophic b/l LE. No open wounds b/l lower extremities. No interdigital macerations b/l lower extremities. Toenails 2-5 bilaterally and R hallux elongated, discolored, dystrophic, thickened, and crumbly with subungual debris and  tenderness to dorsal palpation. Anonychia noted L hallux. Nailbed(s) epithelialized.  Hyperkeratotic lesion(s) L hallux and submet head 1 left foot.  No erythema, no edema, no drainage, no fluctuance.  Musculoskeletal:  Normal muscle strength 5/5 to all lower extremity muscle groups bilaterally. No pain crepitus or joint limitation noted with ROM b/l. Hallux valgus with bunion deformity noted b/l lower extremities.  Neurological:  Protective sensation intact 5/5 intact bilaterally with 10g monofilament b/l. Vibratory sensation intact b/l.  Assessment and Plan:  1. Pain due to onychomycosis of toenail   2. Callus   3. PAD (peripheral artery disease) ([facility])    -Mycotic toenails 2-5 bilaterally and R hallux were debrided in length and girth with sterile nail nippers and dremel without iatrogenic bleeding. -Callus(es) L hallux and submet head 1 left foot pared utilizing sterile scalpel blade without complication or incident. Total number debrided =2. -Patient/POA to call should there be question/concern in the interim.   Return in about 3 months (around 04/11/2022).  [name], DPM

## 2022-01-25 DIAGNOSIS — L720 Epidermal cyst: Secondary | ICD-10-CM | POA: Diagnosis not present

## 2022-01-25 DIAGNOSIS — L738 Other specified follicular disorders: Secondary | ICD-10-CM | POA: Diagnosis not present

## 2022-01-25 DIAGNOSIS — L821 Other seborrheic keratosis: Secondary | ICD-10-CM | POA: Diagnosis not present

## 2022-01-25 DIAGNOSIS — D2272 Melanocytic nevi of left lower limb, including hip: Secondary | ICD-10-CM | POA: Diagnosis not present

## 2022-01-25 DIAGNOSIS — Z85828 Personal history of other malignant neoplasm of skin: Secondary | ICD-10-CM | POA: Diagnosis not present

## 2022-01-25 DIAGNOSIS — L814 Other melanin hyperpigmentation: Secondary | ICD-10-CM | POA: Diagnosis not present

## 2022-01-25 DIAGNOSIS — D692 Other nonthrombocytopenic purpura: Secondary | ICD-10-CM | POA: Diagnosis not present

## 2022-01-25 DIAGNOSIS — D225 Melanocytic nevi of trunk: Secondary | ICD-10-CM | POA: Diagnosis not present

## 2022-01-25 DIAGNOSIS — D2262 Melanocytic nevi of left upper limb, including shoulder: Secondary | ICD-10-CM | POA: Diagnosis not present

## 2022-01-25 DIAGNOSIS — D1801 Hemangioma of skin and subcutaneous tissue: Secondary | ICD-10-CM | POA: Diagnosis not present

## 2022-01-26 DIAGNOSIS — J449 Chronic obstructive pulmonary disease, unspecified: Secondary | ICD-10-CM | POA: Diagnosis not present

## 2022-01-26 DIAGNOSIS — I129 Hypertensive chronic kidney disease with stage 1 through stage 4 chronic kidney disease, or unspecified chronic kidney disease: Secondary | ICD-10-CM | POA: Diagnosis not present

## 2022-01-26 DIAGNOSIS — E1159 Type 2 diabetes mellitus with other circulatory complications: Secondary | ICD-10-CM | POA: Diagnosis not present

## 2022-01-26 DIAGNOSIS — I5032 Chronic diastolic (congestive) heart failure: Secondary | ICD-10-CM | POA: Diagnosis not present

## 2022-01-26 DIAGNOSIS — N1831 Chronic kidney disease, stage 3a: Secondary | ICD-10-CM | POA: Diagnosis not present

## 2022-02-16 DIAGNOSIS — Z8719 Personal history of other diseases of the digestive system: Secondary | ICD-10-CM

## 2022-02-16 HISTORY — DX: Personal history of other diseases of the digestive system: Z87.19

## 2022-03-08 DIAGNOSIS — Z85828 Personal history of other malignant neoplasm of skin: Secondary | ICD-10-CM | POA: Diagnosis not present

## 2022-03-08 DIAGNOSIS — L988 Other specified disorders of the skin and subcutaneous tissue: Secondary | ICD-10-CM | POA: Diagnosis not present

## 2022-03-08 DIAGNOSIS — D485 Neoplasm of uncertain behavior of skin: Secondary | ICD-10-CM | POA: Diagnosis not present

## 2022-03-10 ENCOUNTER — Emergency Department (HOSPITAL_COMMUNITY): Payer: Medicare PPO

## 2022-03-10 ENCOUNTER — Other Ambulatory Visit: Payer: Self-pay

## 2022-03-10 ENCOUNTER — Inpatient Hospital Stay (HOSPITAL_COMMUNITY)
Admission: EM | Admit: 2022-03-10 | Discharge: 2022-03-12 | DRG: 379 | Disposition: A | Discharge status: Home / Self Care | Payer: Medicare PPO | Attending: Internal Medicine | Admitting: Internal Medicine

## 2022-03-10 ENCOUNTER — Encounter (HOSPITAL_COMMUNITY): Payer: Self-pay

## 2022-03-10 DIAGNOSIS — Z955 Presence of coronary angioplasty implant and graft: Secondary | ICD-10-CM

## 2022-03-10 DIAGNOSIS — Z8611 Personal history of tuberculosis: Secondary | ICD-10-CM | POA: Diagnosis not present

## 2022-03-10 DIAGNOSIS — N200 Calculus of kidney: Secondary | ICD-10-CM

## 2022-03-10 DIAGNOSIS — N281 Cyst of kidney, acquired: Secondary | ICD-10-CM | POA: Diagnosis not present

## 2022-03-10 DIAGNOSIS — Z860100 Personal history of colon polyps, unspecified: Secondary | ICD-10-CM

## 2022-03-10 DIAGNOSIS — K429 Umbilical hernia without obstruction or gangrene: Secondary | ICD-10-CM | POA: Diagnosis present

## 2022-03-10 DIAGNOSIS — Z83438 Family history of other disorder of lipoprotein metabolism and other lipidemia: Secondary | ICD-10-CM

## 2022-03-10 DIAGNOSIS — Z96641 Presence of right artificial hip joint: Secondary | ICD-10-CM | POA: Diagnosis present

## 2022-03-10 DIAGNOSIS — I129 Hypertensive chronic kidney disease with stage 1 through stage 4 chronic kidney disease, or unspecified chronic kidney disease: Secondary | ICD-10-CM | POA: Diagnosis present

## 2022-03-10 DIAGNOSIS — G4733 Obstructive sleep apnea (adult) (pediatric): Secondary | ICD-10-CM | POA: Diagnosis present

## 2022-03-10 DIAGNOSIS — I7143 Infrarenal abdominal aortic aneurysm, without rupture: Secondary | ICD-10-CM | POA: Diagnosis present

## 2022-03-10 DIAGNOSIS — Z91013 Allergy to seafood: Secondary | ICD-10-CM

## 2022-03-10 DIAGNOSIS — K5731 Diverticulosis of large intestine without perforation or abscess with bleeding: Principal | ICD-10-CM

## 2022-03-10 DIAGNOSIS — I2510 Atherosclerotic heart disease of native coronary artery without angina pectoris: Secondary | ICD-10-CM | POA: Diagnosis present

## 2022-03-10 DIAGNOSIS — I713 Abdominal aortic aneurysm, ruptured, unspecified: Secondary | ICD-10-CM

## 2022-03-10 DIAGNOSIS — I7410 Embolism and thrombosis of unspecified parts of aorta: Secondary | ICD-10-CM

## 2022-03-10 DIAGNOSIS — D638 Anemia in other chronic diseases classified elsewhere: Secondary | ICD-10-CM | POA: Diagnosis not present

## 2022-03-10 DIAGNOSIS — N1831 Chronic kidney disease, stage 3a: Secondary | ICD-10-CM | POA: Diagnosis present

## 2022-03-10 DIAGNOSIS — R7303 Prediabetes: Secondary | ICD-10-CM | POA: Diagnosis present

## 2022-03-10 DIAGNOSIS — Z7982 Long term (current) use of aspirin: Secondary | ICD-10-CM

## 2022-03-10 DIAGNOSIS — N2889 Other specified disorders of kidney and ureter: Secondary | ICD-10-CM | POA: Diagnosis not present

## 2022-03-10 DIAGNOSIS — I739 Peripheral vascular disease, unspecified: Secondary | ICD-10-CM | POA: Diagnosis present

## 2022-03-10 DIAGNOSIS — K5730 Diverticulosis of large intestine without perforation or abscess without bleeding: Secondary | ICD-10-CM | POA: Diagnosis not present

## 2022-03-10 DIAGNOSIS — K922 Gastrointestinal hemorrhage, unspecified: Secondary | ICD-10-CM | POA: Diagnosis present

## 2022-03-10 DIAGNOSIS — Z7951 Long term (current) use of inhaled steroids: Secondary | ICD-10-CM

## 2022-03-10 DIAGNOSIS — Z880 Allergy status to penicillin: Secondary | ICD-10-CM | POA: Diagnosis not present

## 2022-03-10 DIAGNOSIS — I7133 Infrarenal abdominal aortic aneurysm, ruptured: Secondary | ICD-10-CM | POA: Diagnosis present

## 2022-03-10 DIAGNOSIS — I10 Essential (primary) hypertension: Secondary | ICD-10-CM | POA: Diagnosis present

## 2022-03-10 DIAGNOSIS — Z87442 Personal history of urinary calculi: Secondary | ICD-10-CM

## 2022-03-10 DIAGNOSIS — J449 Chronic obstructive pulmonary disease, unspecified: Secondary | ICD-10-CM | POA: Diagnosis present

## 2022-03-10 DIAGNOSIS — Z20822 Contact with and (suspected) exposure to covid-19: Secondary | ICD-10-CM | POA: Diagnosis present

## 2022-03-10 DIAGNOSIS — N183 Chronic kidney disease, stage 3 unspecified: Secondary | ICD-10-CM

## 2022-03-10 DIAGNOSIS — E785 Hyperlipidemia, unspecified: Secondary | ICD-10-CM | POA: Diagnosis present

## 2022-03-10 DIAGNOSIS — Z91048 Other nonmedicinal substance allergy status: Secondary | ICD-10-CM

## 2022-03-10 DIAGNOSIS — K625 Hemorrhage of anus and rectum: Principal | ICD-10-CM

## 2022-03-10 DIAGNOSIS — Z8249 Family history of ischemic heart disease and other diseases of the circulatory system: Secondary | ICD-10-CM

## 2022-03-10 LAB — COMPREHENSIVE METABOLIC PANEL
ALT: 11 U/L (ref 0–44)
AST: 12 U/L — ABNORMAL LOW (ref 15–41)
Albumin: 3.7 g/dL (ref 3.5–5.0)
Alkaline Phosphatase: 72 U/L (ref 38–126)
Anion gap: 6 (ref 5–15)
BUN: 26 mg/dL — ABNORMAL HIGH (ref 8–23)
CO2: 27 mmol/L (ref 22–32)
Calcium: 9.2 mg/dL (ref 8.9–10.3)
Chloride: 104 mmol/L (ref 98–111)
Creatinine, Ser: 1.42 mg/dL — ABNORMAL HIGH (ref 0.61–1.24)
GFR, Estimated: 52 mL/min — ABNORMAL LOW (ref >60)
Glucose, Bld: 125 mg/dL — ABNORMAL HIGH (ref 70–99)
Potassium: 4.4 mmol/L (ref 3.5–5.1)
Sodium: 137 mmol/L (ref 135–145)
Total Bilirubin: 0.2 mg/dL — ABNORMAL LOW (ref 0.3–1.2)
Total Protein: 7.1 g/dL (ref 6.5–8.1)

## 2022-03-10 LAB — CBC
HCT: 39 % (ref 39.0–52.0)
HCT: 37 % — ABNORMAL LOW (ref 39.0–52.0)
Hemoglobin: 12.7 g/dL — ABNORMAL LOW (ref 13.0–17.0)
Hemoglobin: 12.1 g/dL — ABNORMAL LOW (ref 13.0–17.0)
MCH: 31.7 pg (ref 26.0–34.0)
MCH: 31.8 pg (ref 26.0–34.0)
MCHC: 32.6 g/dL (ref 30.0–36.0)
MCHC: 32.7 g/dL (ref 30.0–36.0)
MCV: 97.3 fL (ref 80.0–100.0)
MCV: 97.1 fL (ref 80.0–100.0)
Platelets: 232 10*3/uL (ref 150–400)
Platelets: 210 10*3/uL (ref 150–400)
RBC: 4.01 MIL/uL — ABNORMAL LOW (ref 4.22–5.81)
RBC: 3.81 MIL/uL — ABNORMAL LOW (ref 4.22–5.81)
RDW: 14.7 % (ref 11.5–15.5)
RDW: 14.6 % (ref 11.5–15.5)
WBC: 14.6 10*3/uL — ABNORMAL HIGH (ref 4.0–10.5)
WBC: 13.1 10*3/uL — ABNORMAL HIGH (ref 4.0–10.5)
nRBC: 0 % (ref 0.0–0.2)
nRBC: 0 % (ref 0.0–0.2)

## 2022-03-10 LAB — TYPE AND SCREEN
ABO/RH(D): A POS
Antibody Screen: NEGATIVE

## 2022-03-10 LAB — GLUCOSE, CAPILLARY: Glucose-Capillary: 114 mg/dL — ABNORMAL HIGH (ref 70–99)

## 2022-03-10 LAB — POC OCCULT BLOOD, ED: Fecal Occult Bld: POSITIVE — AB

## 2022-03-10 MED ORDER — FLUTICASONE FUROATE-VILANTEROL 200-25 MCG/ACT IN AEPB
1.0000 | INHALATION_SPRAY | Freq: Every day | RESPIRATORY_TRACT | Status: DC
  Administered 2022-03-11 – 2022-03-12 (×2): 1 via RESPIRATORY_TRACT
  Filled 2022-03-10: qty 28

## 2022-03-10 MED ORDER — ACETAMINOPHEN 325 MG PO TABS: 650.0000 mg | ORAL_TABLET | Freq: Four times a day (QID) | ORAL | Status: DC | PRN

## 2022-03-10 MED ORDER — DILTIAZEM HCL ER COATED BEADS 120 MG PO CP24
120.0000 mg | ORAL_CAPSULE | Freq: Every day | ORAL | Status: DC
  Administered 2022-03-11 – 2022-03-12 (×2): 120 mg via ORAL
  Filled 2022-03-10 (×2): qty 1

## 2022-03-10 MED ORDER — IRBESARTAN 150 MG PO TABS
150.0000 mg | ORAL_TABLET | Freq: Every evening | ORAL | Status: DC
  Administered 2022-03-11: 150 mg via ORAL
  Filled 2022-03-10: qty 1

## 2022-03-10 MED ORDER — FINASTERIDE 5 MG PO TABS
5.0000 mg | ORAL_TABLET | Freq: Every day | ORAL | Status: DC
  Administered 2022-03-10 – 2022-03-11 (×2): 5 mg via ORAL
  Filled 2022-03-10 (×2): qty 1

## 2022-03-10 MED ORDER — ATORVASTATIN CALCIUM 10 MG PO TABS
10.0000 mg | ORAL_TABLET | Freq: Every day | ORAL | Status: DC
  Administered 2022-03-10 – 2022-03-11 (×2): 10 mg via ORAL
  Filled 2022-03-10 (×2): qty 1

## 2022-03-10 MED ORDER — UMECLIDINIUM BROMIDE 62.5 MCG/ACT IN AEPB
1.0000 | INHALATION_SPRAY | Freq: Every day | RESPIRATORY_TRACT | Status: DC
  Administered 2022-03-11 – 2022-03-12 (×2): 1 via RESPIRATORY_TRACT
  Filled 2022-03-10: qty 7

## 2022-03-10 MED ORDER — ALBUTEROL SULFATE (2.5 MG/3ML) 0.083% IN NEBU: 3.0000 mL | INHALATION_SOLUTION | RESPIRATORY_TRACT | Status: DC | PRN

## 2022-03-10 MED ORDER — IOHEXOL 300 MG/ML  SOLN
100.0000 mL | Freq: Once | INTRAMUSCULAR | Status: AC | PRN
  Administered 2022-03-10: 100 mL via INTRAVENOUS

## 2022-03-10 MED ORDER — LACTATED RINGERS IV SOLN: INTRAVENOUS | Status: AC

## 2022-03-10 MED ORDER — DILTIAZEM HCL ER 120 MG PO CP24: 120.0000 mg | ORAL_CAPSULE | Freq: Every day | ORAL | Status: DC

## 2022-03-10 MED ORDER — ACETAMINOPHEN 650 MG RE SUPP: 650.0000 mg | Freq: Four times a day (QID) | RECTAL | Status: DC | PRN

## 2022-03-10 MED ORDER — SODIUM CHLORIDE (PF) 0.9 % IJ SOLN
INTRAMUSCULAR | Status: AC
  Filled 2022-03-10: qty 50

## 2022-03-10 MED ORDER — TRAMADOL HCL 50 MG PO TABS: 50.0000 mg | ORAL_TABLET | Freq: Four times a day (QID) | ORAL | Status: DC | PRN

## 2022-03-10 MED ORDER — ISOSORBIDE MONONITRATE ER 60 MG PO TB24
60.0000 mg | ORAL_TABLET | Freq: Every evening | ORAL | Status: DC
  Administered 2022-03-11: 60 mg via ORAL
  Filled 2022-03-10: qty 1

## 2022-03-10 NOTE — ED Triage Notes (Addendum)
Pt BIB EMS from home. Pt reports pain with bowel movement and bright red blood when wiping since last night. Pt endorses abdominal pressure.  ?

## 2022-03-10 NOTE — Plan of Care (Signed)

## 2022-03-10 NOTE — ED Notes (Signed)
ED TO INPATIENT HANDOFF REPORT ? ?Name/Age/Gender ?Dean [name]. ?76 y.o. ?male ? ?Code Status ?Code Status History   ? ? Date Active Date Inactive Code Status Order ID Comments User Context  ? 07/15/2020 1345 07/17/2020 1758 Full Code [id number]  Maurice March, PA-C Inpatient  ? 02/14/2020 1501 02/14/2020 2241 Full Code [id number]  Elam Dutch, MD Inpatient  ? ?  ? ? ?Home/SNF/Other ?Home ? ?Chief Complaint ?Acute GI bleeding [K92.2] ? ?Level of Care/Admitting Diagnosis ?ED Disposition   ? ? ED Disposition  ?Admit  ? Condition  ?--  ? Comment  ?Hospital Area: [facility] [[id number]] ? Level of Care: Telemetry [5] ? Admit to tele based on following criteria: Monitor for Ischemic changes ? May place patient in observation at [facility] or [place] if equivalent level of care is available:: No ? Covid Evaluation: Confirmed COVID Negative ? Diagnosis: Acute GI bleeding [253168] ? Admitting Physician: Rise Patience [phone] ? Attending Physician: Rise Patience [phone] ?  ?  ? ?  ? ? ?Medical History ?Past Medical History:  ?Diagnosis Date  ? AAA (abdominal aortic aneurysm)   ? infrarenal aaa 3.1 x 2.8 per abd/pelvis ct 02-26-20 epic  ? Allergy   ? pollen  ? Anginal pain ([place]) 2005  ? Arthritis   ? hips, back, neck   ? Asthma   ? Atrial fibrillation ([place])   ? no documentation in epic/ pt states has never been told has A Fib  ? CAD (coronary artery disease)   ? s/p angioplasty and stenting x3 1994, pt states has this done  ? Cataract   ? small, mild  ? Chronic kidney disease   ? kidney cyst  ? Complication of anesthesia   ? woke up during gall bladder sx yrs ago and with colonscopy  ? Complication of anesthesia   ? pt thinks stopped breathing after gallbladder sx years ago, no record found  ? COPD (chronic obstructive pulmonary disease) ([place])   ? Dyspnea   ? Dysrhythmia   ? regular irregular heart beat  ? GERD (gastroesophageal reflux disease)   ? hx of  ? History of hiatal hernia    ? History of kidney stones   ? History of prediabetes   ? none in last 8 months since 40 lb weight loss  ? Hyperlipidemia   ? Hypertension   ? Lower extremity edema since 02-14-2020  ? swelling goes down when sleeps resolved 06-29-20  ? Neuromuscular disorder ([place])   ? nerve issues with back   ? Peripheral vascular disease ([place])   ? PVC's (premature ventricular contractions)   ? pt denies  ? Sleep apnea   ? wears cpap auto set 5 to 20  ? Tuberculosis 1966  ? ? ?Allergies ?Allergies  ?Allergen Reactions  ? Penicillins   ?  Childhood reaction ? ?REACTION: rapid pulse ?Did it involve swelling of the face/tongue/throat, SOB, or low BP? Yes ?Did it involve sudden or severe rash/hives, skin peeling, or any reaction on the inside of your mouth or nose? Unknown ?Did you need to seek medical attention at a hospital or doctor's office? yes ?When did it last happen?      childhood  ?If all above answers are "NO", may proceed with cephalosporin use. ?  ? Fish Oil   ?  Other reaction(s): Heart attack symptoms  ? Other   ?  Oily seafood salmon-- chest pain  ? Penicillin G   ?  Other reaction(s): Unknown  ? Mold Extract [Trichophyton] Rash  ? ? ?IV Location/Drains/Wounds ?Patient Lines/Drains/Airways Status   ? ? Active Line/Drains/Airways   ? ? Name Placement date Placement time Site Days  ? Peripheral IV 03/10/22 20 G 1" Right Antecubital 03/10/22  1631  Antecubital  less than 1  ? Closed System Drain 1 Right;Other (Comment) RLQ Bulb (JP) 19 Fr. 11/20/20  1146  RLQ  475  ? Urethral Catheter Hilda RN Straight-tip 16 Fr. 11/22/20  0645  Straight-tip  473  ? Ureteral Drain/Stent Right ureter 6 Fr. 11/20/20  1126  Right ureter  475  ? Incision (Closed) 07/15/20 Hip Right 07/15/20  0829  -- 603  ? Incision - 6 Ports Abdomen Right;Lower Right;Mid Right Right;Upper Umbilicus;Mid Upper;Mid 37/85/88  1142  -- 475  ? ?  ?  ? ?  ? ? ?Labs/Imaging ?Results for orders placed or performed during the hospital encounter of 03/10/22 (from the  past 48 hour(s))  ?Type and screen [place]     Status: None  ? Collection Time: 03/10/22  1:06 PM  ?Result Value Ref Range  ? ABO/RH(D) A POS   ? Antibody Screen NEG   ? Sample Expiration    ?  03/13/2022,2359 ?Performed at [facility], [place] [address], [place], [place] [address] ?  ?Comprehensive metabolic panel     Status: Abnormal  ? Collection Time: 03/10/22  1:07 PM  ?Result Value Ref Range  ? Sodium 137 135 - 145 mmol/L  ? Potassium 4.4 3.5 - 5.1 mmol/L  ? Chloride 104 98 - 111 mmol/L  ? CO2 27 22 - 32 mmol/L  ? Glucose, Bld 125 (H) 70 - 99 mg/dL  ?  Comment: Glucose reference range applies only to samples taken after fasting for at least 8 hours.  ? BUN 26 (H) 8 - 23 mg/dL  ? Creatinine, Ser 1.42 (H) 0.61 - 1.24 mg/dL  ? Calcium 9.2 8.9 - 10.3 mg/dL  ? Total Protein 7.1 6.5 - 8.1 g/dL  ? Albumin 3.7 3.5 - 5.0 g/dL  ? AST 12 (L) 15 - 41 U/L  ? ALT 11 0 - 44 U/L  ? Alkaline Phosphatase 72 38 - 126 U/L  ? Total Bilirubin 0.2 (L) 0.3 - 1.2 mg/dL  ? GFR, Estimated 52 (L) >60 mL/min  ?  Comment: (NOTE) ?Calculated using the CKD-EPI Creatinine Equation (2021) ?  ? Anion gap 6 5 - 15  ?  Comment: Performed at [facility], [place] [address], [place], [place] [address]  ?CBC     Status: Abnormal  ? Collection Time: 03/10/22  1:07 PM  ?Result Value Ref Range  ? WBC 14.6 (H) 4.0 - 10.5 K/uL  ? RBC 4.01 (L) 4.22 - 5.81 MIL/uL  ? Hemoglobin 12.7 (L) 13.0 - 17.0 g/dL  ? HCT 39.0 39.0 - 52.0 %  ? MCV 97.3 80.0 - 100.0 fL  ? MCH 31.7 26.0 - 34.0 pg  ? MCHC 32.6 30.0 - 36.0 g/dL  ? RDW 14.7 11.5 - 15.5 %  ? Platelets 232 150 - 400 K/uL  ? nRBC 0.0 0.0 - 0.2 %  ?  Comment: Performed at [facility], [place] [address] [place], [place] [address]  ?POC occult blood, ED     Status: Abnormal  ? Collection Time: 03/10/22  3:48 PM  ?Result Value Ref Range  ? Fecal Occult Bld POSITIVE (A) NEGATIVE  ? ?CT Abdomen Pelvis W Contrast ? ?  Result Date:  03/10/2022 ?CLINICAL DATA:  Abdominal pain, acute, nonlocalized bright red blood per rectum. EXAM: CT ABDOMEN AND PELVIS WITH CONTRAST TECHNIQUE: Multidetector CT imaging of the abdomen and pelvis was performed using the standard protocol following bolus administration of intravenous contrast. RADIATION DOSE REDUCTION: This exam was performed according to the departmental dose-optimization program which includes automated exposure control, adjustment of the mA and/or kV according to patient size and/or use of iterative reconstruction technique. CONTRAST:  167m OMNIPAQUE IOHEXOL 300 MG/ML  SOLN COMPARISON:  CT 02/26/2020. FINDINGS: Lower chest: Centrilobular emphysema.  No acute abnormality. Hepatobiliary: No focal liver abnormality is seen. Prior cholecystectomy. Pancreas: Unremarkable. No pancreatic ductal dilatation or surrounding inflammatory changes. Spleen: Normal in size without focal abnormality. Adrenals/Urinary Tract: Bilateral renal pelvic fullness. There is some higher density material in the right renal pelvis. The ureters appear decompressed. There are multiple simple appearing renal cysts bilaterally multiple left-sided renal sinus cysts. There are renal vascular calcifications bilaterally. There is a cluster of stones in the lower pole the right kidney largest measuring 4 mm. There is a 1-2 mm possible stone at the right ureteropelvic junction (series 2, image 36). There is an indeterminate complex cystic lesion in the lower pole the right kidney measuring 1.6 x 1.2. (Series 2, image 37). There is a minimally complex cyst in the right mid kidney measuring 1.5 x 1.2 cm with thin internal septation (series 2, image 33). The bladder is unremarkable. Stomach/Bowel: The stomach is within normal limits. There is no evidence of bowel obstruction.The appendix is surgically absent. Extensive sigmoid diverticulosis. No acute diverticulitis. Vascular/Lymphatic: Extensive mixed atherosclerosis of the abdominal  aorta and branch vessels. Mild stenosis at the celiac ostia. Moderate stenosis at the SMA origin. The IMA is poorly opacified. There is an infrarenal abdominal aortic aneurysm measuring 3.4 x 3.0 cm, previously 3.1 x 2.

## 2022-03-10 NOTE — H&P (Signed)
?History and Physical  ? ? ?[employer]. NWG:[id number] DOB: 12-Jun-1946 DOA: 03/10/2022 ? ?PCP: [name], MD  ?Patient coming from: Home. ? ?Chief Complaint: Rectal bleeding. ? ?HPI: Dean [name]. is a 76 y.o. male with history of CAD status post stenting, left lower extremity femoropopliteal bypass, chronic kidney disease stage III, chronic anemia, COPD, sleep apnea, abdominal aortic aneurysm infrarenal presents to the ER after patient noticed multiple episodes of frank rectal bleeding over the last 24 hours.  Denies any abdominal pain or nausea vomiting. ? ?Patient has had colonoscopy in 2018 which showed diverticulosis.  Was done by Dr. Henrene Pastor [facility].  Denies taking any NSAIDs.  Does have leukocytosis.  But patient is afebrile. ? ?ED Course: In the ER patient was hemodynamically stable.  Has had 1 bowel movement which was bloody but patient stated volume was not as much as when he had bowel movement at home.  Hemoglobin is around 12 which is what patient had previously when compared to one in epic.  ER physician discussed with [facility].  Patient admitted for further work-up.  Patient had CT abdomen pelvis done which shows nonspecific findings in the kidneys. ? ?Review of Systems: As per HPI, rest all negative. ? ? ?Past Medical History:  ?Diagnosis Date  ? AAA (abdominal aortic aneurysm)   ? infrarenal aaa 3.1 x 2.8 per abd/pelvis ct 02-26-20 epic  ? Allergy   ? pollen  ? Anginal pain ([place]) 2005  ? Arthritis   ? hips, back, neck   ? Asthma   ? Atrial fibrillation ([place])   ? no documentation in epic/ pt states has never been told has A Fib  ? CAD (coronary artery disease)   ? s/p angioplasty and stenting x3 1994, pt states has this done  ? Cataract   ? small, mild  ? Chronic kidney disease   ? kidney cyst  ? Complication of anesthesia   ? woke up during gall bladder sx yrs ago and with colonscopy  ? Complication of anesthesia   ? pt thinks stopped breathing after gallbladder sx years ago,  no record found  ? COPD (chronic obstructive pulmonary disease) ([place])   ? Dyspnea   ? Dysrhythmia   ? regular irregular heart beat  ? GERD (gastroesophageal reflux disease)   ? hx of  ? History of hiatal hernia   ? History of kidney stones   ? History of prediabetes   ? none in last 8 months since 40 lb weight loss  ? Hyperlipidemia   ? Hypertension   ? Lower extremity edema since 02-14-2020  ? swelling goes down when sleeps resolved 06-29-20  ? Neuromuscular disorder ([place])   ? nerve issues with back   ? Peripheral vascular disease ([place])   ? PVC's (premature ventricular contractions)   ? pt denies  ? Sleep apnea   ? wears cpap auto set 5 to 20  ? Tuberculosis 1966  ? ? ?Past Surgical History:  ?Procedure Laterality Date  ? APPENDECTOMY    ? CHOLECYSTECTOMY  1990s  ? Gall Bladder  ? COLONOSCOPY    ? CORONARY ANGIOPLASTY WITH STENT PLACEMENT  1994  ? CYSTOSCOPY WITH INSERTION OF UROLIFT N/A 04/14/2020  ? Procedure: CYSTOSCOPY WITH INSERTION OF UROLIFT;  Surgeon: Festus Aloe, MD;  Location: [facility];  Service: Urology;  Laterality: N/A;  ? CYSTOSCOPY WITH STENT PLACEMENT Right 07/07/2020  ? Procedure: CYSTOSCOPY WITH STENT CHANGE;  Surgeon: Festus Aloe, MD;  Location:  [facility];  Service: Urology;  Laterality: Right;  ? CYSTOSCOPY WITH URETEROSCOPY AND STENT PLACEMENT Bilateral 01/03/2020  ? Procedure: CYSTOSCOPY WITH RETROGRADE/ URETEROSCOPY AND RIGHT STENT PLACEMENT BLADDER BIOPSY;  Surgeon: Festus Aloe, MD;  Location: [facility];  Service: Urology;  Laterality: Bilateral;  ONLY NEEDS 30 MIN  ? CYSTOSCOPY WITH URETEROSCOPY AND STENT PLACEMENT Right 04/14/2020  ? Procedure: CYSTOSCOPY WITH URETEROSCOPY, RETROGRADE  AND STENT REPLACEMENT;  Surgeon: Festus Aloe, MD;  Location: [facility];  Service: Urology;  Laterality: Right;  ? EYE SURGERY Bilateral 2019  ? cataracts  ? lumbar disectomy  2005  ? L3 - 4 - 5  ? POLYPECTOMY    ? PR VEIN  BYPASS GRAFT,AORTO-FEM-POP  08/31/11  ? Right to Left Fem-Fem  ? RENAL ANGIOGRAPHY N/A 02/14/2020  ? Procedure: RENAL ANGIOGRAPHY;  Surgeon: Elam Dutch, MD;  Location: [facility];  Service: Cardiovascular;  Laterality: N/A;  Bilateral  ? ROBOT ASSISTED PYELOPLASTY Right 11/20/2020  ? Procedure: XI ROBOTIC RIGHT ASSISTED PYELOPLASTY AND STENT PLACEMENT;  Surgeon: Ardis Hughs, MD;  Location: [facility];  Service: Urology;  Laterality: Right;  ? TONSILLECTOMY  as child  ? TONSILLECTOMY    ? TOTAL HIP ARTHROPLASTY Right 07/15/2020  ? Procedure: TOTAL HIP ARTHROPLASTY ANTERIOR APPROACH;  Surgeon: Gaynelle Arabian, MD;  Location: [facility];  Service: Orthopedics;  Laterality: Right;  167mn  ? UMBILICAL HERNIA REPAIR    ? x 2  ? ? ? reports that he has been smoking cigarettes. He has a 51.00 pack-year smoking history. He has never used smokeless tobacco. He reports that he does not currently use alcohol. He reports that he does not use drugs. ? ?Allergies  ?Allergen Reactions  ? Penicillins   ?  Childhood reaction ? ?REACTION: rapid pulse ?Did it involve swelling of the face/tongue/throat, SOB, or low BP? Yes ?Did it involve sudden or severe rash/hives, skin peeling, or any reaction on the inside of your mouth or nose? Unknown ?Did you need to seek medical attention at a hospital or doctor's office? yes ?When did it last happen?      childhood  ?If all above answers are "NO", may proceed with cephalosporin use. ?  ? Fish Oil   ?  Other reaction(s): Heart attack symptoms  ? Other   ?  Oily seafood salmon-- chest pain  ? Penicillin G   ?  Other reaction(s): Unknown  ? Mold Extract [Trichophyton] Rash  ? ? ?Family History  ?Problem Relation Age of Onset  ? Other Father   ?     ETOH  ? Diabetes Father   ? Hypertension Father   ? Kidney cancer Mother   ? Cancer Mother   ?     renal  ? Hyperlipidemia Mother   ? Hypertension Mother   ? Cancer Sister   ?     breast  ? Hyperlipidemia Sister   ? Hypertension Sister   ?  Breast cancer Sister   ? Hyperlipidemia Brother   ? Hypertension Brother   ? Prostate cancer Brother   ? Colon cancer Neg Hx   ? Colon polyps Neg Hx   ? Esophageal cancer Neg Hx   ? Rectal cancer Neg Hx   ? Stomach cancer Neg Hx   ? ? ?Prior to Admission medications   ?Medication Sig Start Date End Date Taking? Authorizing Provider  ?albuterol (PROVENTIL) (2.5 MG/3ML) 0.083% nebulizer solution USE 1 VIAL VIA NEBULIZER EVERY 6  HOURS AS NEEDED FOR WHEEZING OR SHORTNESS OF BREATH ?Patient taking differently: Take 2.5 mg by nebulization every 6 (six) hours as needed for wheezing or shortness of breath. 07/03/19  Yes Young, Tarri Fuller D, MD  ?albuterol (VENTOLIN HFA) 108 (90 Base) MCG/ACT inhaler INHALE 1 PUFF INTO THE LUNGS TWICE DAILY ?Patient taking differently: 1 puff every 6 (six) hours as needed for wheezing or shortness of breath. 04/26/21  Yes Young, Tarri Fuller D, MD  ?antiseptic oral rinse (BIOTENE) LIQD 15 mLs by Mouth Rinse route daily as needed for dry mouth.    Yes [provider]  ?aspirin 325 MG tablet Take 325 mg by mouth daily.   Yes [provider]  ?atorvastatin (LIPITOR) 10 MG tablet Take 10 mg by mouth at bedtime.   Yes [provider]  ?Azelastine-Fluticasone 137-50 MCG/ACT SUSP PLACE 1-2 SPRAYS INTO EACH NOSTRIL AT BEDTIME ?Patient taking differently: Place 1 spray into both nostrils at bedtime. 04/18/21  Yes Young, Tarri Fuller D, MD  ?diltiazem (DILACOR XR) 120 MG 24 hr capsule Take 1 capsule (120 mg total) by mouth daily. Take 1 capsule (120 mg total) by mouth daily. Schedule office visit for future refills ?Patient taking differently: Take 120 mg by mouth daily. 09/03/21  Yes Hilty, Nadean Corwin, MD  ?docusate sodium (COLACE) 100 MG capsule Take 100 mg by mouth daily.   Yes [provider]  ?esomeprazole (NEXIUM) 40 MG capsule Take 40 mg by mouth daily as needed (acid reflux/indigestion.).  02/07/19  Yes [provider]  ?finasteride (PROSCAR) 5 MG tablet Take 5 mg by  mouth at bedtime.  01/20/20  Yes [provider]  ?Fluticasone-Umeclidin-Vilant (TRELEGY ELLIPTA) 200-62.5-25 MCG/ACT AEPB Inhale 1 puff into the lungs daily.   Yes [provider]  ?irb

## 2022-03-10 NOTE — ED Provider Notes (Signed)
?[facility] DEPT ?Provider Note ? ? ?CSN: [id number] ?Arrival date & time: 03/10/22  1234 ? ?  ? ?History ? ?Chief Complaint  ?Patient presents with  ? Rectal Bleeding  ? ? ?Dean [name]. is a 76 y.o. male. ? ?HPI ?Patient presents for evaluation of bright red blood per rectum.  He takes aspirin but not anticoagulants.  He reports seeing large amounts of blood several times, without stool today.  He has had some pressure sensation in his abdomen and has sudden urge to defecate, but no persistent abdominal pain.  He denies nausea, vomiting, fever or dizziness.  No known history of colon cancer. ?  ? ?Home Medications ?Prior to Admission medications   ?Medication Sig Start Date End Date Taking? Authorizing Provider  ?albuterol (PROVENTIL) (2.5 MG/3ML) 0.083% nebulizer solution USE 1 VIAL VIA NEBULIZER EVERY 6 HOURS AS NEEDED FOR WHEEZING OR SHORTNESS OF BREATH ?Patient taking differently: Take 2.5 mg by nebulization every 6 (six) hours as needed for wheezing or shortness of breath. 07/03/19  Yes Young, Tarri Fuller D, MD  ?albuterol (VENTOLIN HFA) 108 (90 Base) MCG/ACT inhaler INHALE 1 PUFF INTO THE LUNGS TWICE DAILY ?Patient taking differently: 1 puff every 6 (six) hours as needed for wheezing or shortness of breath. 04/26/21  Yes Young, Tarri Fuller D, MD  ?antiseptic oral rinse (BIOTENE) LIQD 15 mLs by Mouth Rinse route daily as needed for dry mouth.    Yes [provider]  ?aspirin 325 MG tablet Take 325 mg by mouth daily.   Yes [provider]  ?atorvastatin (LIPITOR) 10 MG tablet Take 10 mg by mouth at bedtime.   Yes [provider]  ?Azelastine-Fluticasone 137-50 MCG/ACT SUSP PLACE 1-2 SPRAYS INTO EACH NOSTRIL AT BEDTIME ?Patient taking differently: Place 1 spray into both nostrils at bedtime. 04/18/21  Yes Young, Tarri Fuller D, MD  ?diltiazem (DILACOR XR) 120 MG 24 hr capsule Take 1 capsule (120 mg total) by mouth daily. Take 1 capsule (120 mg total) by mouth daily.  Schedule office visit for future refills ?Patient taking differently: Take 120 mg by mouth daily. 09/03/21  Yes Hilty, Nadean Corwin, MD  ?docusate sodium (COLACE) 100 MG capsule Take 100 mg by mouth daily.   Yes [provider]  ?esomeprazole (NEXIUM) 40 MG capsule Take 40 mg by mouth daily as needed (acid reflux/indigestion.).  02/07/19  Yes [provider]  ?finasteride (PROSCAR) 5 MG tablet Take 5 mg by mouth at bedtime.  01/20/20  Yes [provider]  ?Fluticasone-Umeclidin-Vilant (TRELEGY ELLIPTA) 200-62.5-25 MCG/ACT AEPB Inhale 1 puff into the lungs daily.   Yes [provider]  ?irbesartan (AVAPRO) 150 MG tablet Take 150 mg by mouth every evening.  11/11/19  Yes [provider]  ?isosorbide mononitrate (IMDUR) 60 MG 24 hr tablet Take 60 mg by mouth every evening.   Yes [provider]  ?magnesium oxide (MAG-OX) 400 MG tablet Take 400 mg by mouth every other day.   Yes [provider]  ?Menthol, Topical Analgesic, (BLUE-EMU MAXIMUM STRENGTH EX) Apply 1 application topically 3 (three) times daily as needed (pain.).   Yes [provider]  ?Polyethyl Glycol-Propyl Glycol (SYSTANE ULTRA OP) Place 1 drop into both eyes daily as needed (Dry eye).   Yes [provider]  ?simethicone (MYLICON) 557 MG chewable tablet Chew 125 mg by mouth every 6 (six) hours as needed for flatulence. Takes gas x   Yes [provider]  ?traMADol (ULTRAM) 50 MG tablet Take 1-2 tablets (  50-100 mg total) by mouth every 6 (six) hours as needed for moderate pain. ?Patient taking differently: Take 50 mg by mouth every 6 (six) hours as needed for moderate pain. 11/20/20  Yes Dancy, Estill Bamberg, PA-C  ?amphetamine-dextroamphetamine (ADDERALL) 10 MG tablet 1 twice daily as needed for alertness ?Patient not taking: Reported on 03/10/2022 06/28/21   Baird Lyons D, MD  ?Azelastine-Fluticasone 137-50 MCG/ACT SUSP PLACE 1-2 SPRAYS INTO EACH NOSTRIL AT BEDTIME ?Patient not  taking: Reported on 03/10/2022 04/18/21   Baird Lyons D, MD  ?Tiotropium Bromide-Olodaterol (STIOLTO RESPIMAT) 2.5-2.5 MCG/ACT AERS Inhale 2 puffs into the lungs daily. ?Patient not taking: Reported on 03/10/2022 01/11/22   Deneise Lever, MD  ?   ? ?Allergies    ?Penicillins, Fish oil, Other, Penicillin g, and Mold extract [trichophyton]   ? ?Review of Systems   ?Review of Systems ? ?Physical Exam ?Updated Vital Signs ?BP (!) 133/104 (BP Location: Left Arm)   Pulse 74   Temp 98.9 ?F (37.2 ?C) (Oral)   Resp 20   Ht '5\' 5"'$  (1.651 m)   Wt 72.2 kg   SpO2 93%   BMI 26.49 kg/m?  ?Physical Exam ?Vitals and nursing note reviewed.  ?Constitutional:   ?   General: He is not in acute distress. ?   Appearance: He is well-developed. He is not ill-appearing, toxic-appearing or diaphoretic.  ?HENT:  ?   Head: Normocephalic and atraumatic.  ?   Right Ear: External ear normal.  ?   Left Ear: External ear normal.  ?Eyes:  ?   Conjunctiva/sclera: Conjunctivae normal.  ?   Pupils: Pupils are equal, round, and reactive to light.  ?Neck:  ?   Trachea: Phonation normal.  ?Cardiovascular:  ?   Rate and Rhythm: Normal rate and regular rhythm.  ?   Heart sounds: Normal heart sounds.  ?Pulmonary:  ?   Effort: Pulmonary effort is normal.  ?   Breath sounds: Normal breath sounds.  ?Abdominal:  ?   General: There is no distension.  ?   Palpations: Abdomen is soft. There is no mass.  ?   Tenderness: There is no abdominal tenderness. There is no guarding.  ?Genitourinary: ?   Comments: Normal anus.  Melanotic material in the rectum.  No rectal mass or tenderness. ?Musculoskeletal:     ?   General: Normal range of motion.  ?   Cervical back: Normal range of motion and neck supple.  ?Skin: ?   General: Skin is warm and dry.  ?Neurological:  ?   Mental Status: He is alert and oriented to person, place, and time.  ?   Cranial Nerves: No cranial nerve deficit.  ?   Sensory: No sensory deficit.  ?   Motor: No abnormal muscle tone.  ?    Coordination: Coordination normal.  ?Psychiatric:     ?   Mood and Affect: Mood normal.     ?   Behavior: Behavior normal.     ?   Thought Content: Thought content normal.     ?   Judgment: Judgment normal.  ? ? ?ED Results / Procedures / Treatments   ?Labs ?(all labs ordered are listed, but only abnormal results are displayed) ?Labs Reviewed  ?COMPREHENSIVE METABOLIC PANEL - Abnormal; Notable for the following components:  ?    Result Value  ? Glucose, Bld 125 (*)   ? BUN 26 (*)   ? Creatinine, Ser 1.42 (*)   ? AST 12 (*)   ?  Total Bilirubin 0.2 (*)   ? GFR, Estimated 52 (*)   ? All other components within normal limits  ?CBC - Abnormal; Notable for the following components:  ? WBC 14.6 (*)   ? RBC 4.01 (*)   ? Hemoglobin 12.7 (*)   ? All other components within normal limits  ?POC OCCULT BLOOD, ED - Abnormal; Notable for the following components:  ? Fecal Occult Bld POSITIVE (*)   ? All other components within normal limits  ?CBC  ?CBC  ?CBC  ?COMPREHENSIVE METABOLIC PANEL  ?TYPE AND SCREEN  ? ? ?EKG ?None ? ?Radiology ?CT Abdomen Pelvis W Contrast ? ?Result Date: 03/10/2022 ?CLINICAL DATA:  Abdominal pain, acute, nonlocalized bright red blood per rectum. EXAM: CT ABDOMEN AND PELVIS WITH CONTRAST TECHNIQUE: Multidetector CT imaging of the abdomen and pelvis was performed using the standard protocol following bolus administration of intravenous contrast. RADIATION DOSE REDUCTION: This exam was performed according to the departmental dose-optimization program which includes automated exposure control, adjustment of the mA and/or kV according to patient size and/or use of iterative reconstruction technique. CONTRAST:  173m OMNIPAQUE IOHEXOL 300 MG/ML  SOLN COMPARISON:  CT 02/26/2020. FINDINGS: Lower chest: Centrilobular emphysema.  No acute abnormality. Hepatobiliary: No focal liver abnormality is seen. Prior cholecystectomy. Pancreas: Unremarkable. No pancreatic ductal dilatation or surrounding inflammatory changes.  Spleen: Normal in size without focal abnormality. Adrenals/Urinary Tract: Bilateral renal pelvic fullness. There is some higher density material in the right renal pelvis. The ureters appear decompressed. There are mu

## 2022-03-10 NOTE — ED Provider Triage Note (Signed)
Emergency Medicine Provider Triage Evaluation Note ? ?[name] , a 76 y.o. male  was evaluated in triage.  Pt complains of bright red blood per rectum large quantities since last night.  Patient denies taking any blood thinners.  He reports that he takes some Excedrin 325.  Patient reports that he has had some rectal bleeding in the past, denies history of hemorrhoids reports that quantity was significantly less than it has been since last night.  Patient denies significant abdominal pain, recent abdominal surgeries, he has had a few hernia repairs, appendicitis, cholecystitis in the past.  He reports he takes some medication for acid reflux and bloating, no history of colon cancer. Lightheadedness with standing this morning, no syncope. ? ?Review of Systems  ?Positive: Rectal bleeding, lightheadedness ?Negative: Syncope, shob, chest pain ? ?Physical Exam  ?BP (!) 158/90 (BP Location: Left Arm)   Pulse 74   Temp 98.7 ?F (37.1 ?C) (Oral)   Resp 18   SpO2 99%  ?Gen:   Awake, no distress   ?Resp:  Normal effort  ?MSK:   Moves extremities without difficulty  ?Other:  Deferred rectal exam until after triage ? ?Medical Decision Making  ?Medically screening exam initiated at 12:50 PM.  Appropriate orders placed.  [name]. was informed that the remainder of the evaluation will be completed by another provider, this initial triage assessment does not replace that evaluation, and the importance of remaining in the ED until their evaluation is complete. ? ?Workup initiated ?  ?Anselmo Pickler, PA-C ?03/10/22 1252 ? ?

## 2022-03-11 DIAGNOSIS — Z7982 Long term (current) use of aspirin: Secondary | ICD-10-CM | POA: Diagnosis not present

## 2022-03-11 DIAGNOSIS — Z20822 Contact with and (suspected) exposure to covid-19: Secondary | ICD-10-CM | POA: Diagnosis present

## 2022-03-11 DIAGNOSIS — I2510 Atherosclerotic heart disease of native coronary artery without angina pectoris: Secondary | ICD-10-CM | POA: Diagnosis present

## 2022-03-11 DIAGNOSIS — Z7951 Long term (current) use of inhaled steroids: Secondary | ICD-10-CM | POA: Diagnosis not present

## 2022-03-11 DIAGNOSIS — Z91048 Other nonmedicinal substance allergy status: Secondary | ICD-10-CM | POA: Diagnosis not present

## 2022-03-11 DIAGNOSIS — I739 Peripheral vascular disease, unspecified: Secondary | ICD-10-CM | POA: Diagnosis present

## 2022-03-11 DIAGNOSIS — E785 Hyperlipidemia, unspecified: Secondary | ICD-10-CM | POA: Diagnosis present

## 2022-03-11 DIAGNOSIS — Z96641 Presence of right artificial hip joint: Secondary | ICD-10-CM | POA: Diagnosis present

## 2022-03-11 DIAGNOSIS — K429 Umbilical hernia without obstruction or gangrene: Secondary | ICD-10-CM | POA: Diagnosis present

## 2022-03-11 DIAGNOSIS — Z91013 Allergy to seafood: Secondary | ICD-10-CM | POA: Diagnosis not present

## 2022-03-11 DIAGNOSIS — Z87442 Personal history of urinary calculi: Secondary | ICD-10-CM | POA: Diagnosis not present

## 2022-03-11 DIAGNOSIS — K5731 Diverticulosis of large intestine without perforation or abscess with bleeding: Principal | ICD-10-CM

## 2022-03-11 DIAGNOSIS — J449 Chronic obstructive pulmonary disease, unspecified: Secondary | ICD-10-CM | POA: Diagnosis present

## 2022-03-11 DIAGNOSIS — N183 Chronic kidney disease, stage 3 unspecified: Secondary | ICD-10-CM

## 2022-03-11 DIAGNOSIS — G4733 Obstructive sleep apnea (adult) (pediatric): Secondary | ICD-10-CM | POA: Diagnosis not present

## 2022-03-11 DIAGNOSIS — N1831 Chronic kidney disease, stage 3a: Secondary | ICD-10-CM | POA: Diagnosis present

## 2022-03-11 DIAGNOSIS — I10 Essential (primary) hypertension: Secondary | ICD-10-CM | POA: Diagnosis not present

## 2022-03-11 DIAGNOSIS — I129 Hypertensive chronic kidney disease with stage 1 through stage 4 chronic kidney disease, or unspecified chronic kidney disease: Secondary | ICD-10-CM | POA: Diagnosis present

## 2022-03-11 DIAGNOSIS — Z83438 Family history of other disorder of lipoprotein metabolism and other lipidemia: Secondary | ICD-10-CM | POA: Diagnosis not present

## 2022-03-11 DIAGNOSIS — D638 Anemia in other chronic diseases classified elsewhere: Secondary | ICD-10-CM | POA: Diagnosis not present

## 2022-03-11 DIAGNOSIS — Z8249 Family history of ischemic heart disease and other diseases of the circulatory system: Secondary | ICD-10-CM | POA: Diagnosis not present

## 2022-03-11 DIAGNOSIS — Z955 Presence of coronary angioplasty implant and graft: Secondary | ICD-10-CM | POA: Diagnosis not present

## 2022-03-11 DIAGNOSIS — Z8611 Personal history of tuberculosis: Secondary | ICD-10-CM | POA: Diagnosis not present

## 2022-03-11 DIAGNOSIS — R7303 Prediabetes: Secondary | ICD-10-CM | POA: Diagnosis present

## 2022-03-11 DIAGNOSIS — Z880 Allergy status to penicillin: Secondary | ICD-10-CM | POA: Diagnosis not present

## 2022-03-11 DIAGNOSIS — K922 Gastrointestinal hemorrhage, unspecified: Secondary | ICD-10-CM | POA: Diagnosis present

## 2022-03-11 DIAGNOSIS — K625 Hemorrhage of anus and rectum: Secondary | ICD-10-CM | POA: Diagnosis not present

## 2022-03-11 DIAGNOSIS — I7133 Infrarenal abdominal aortic aneurysm, ruptured: Secondary | ICD-10-CM | POA: Diagnosis present

## 2022-03-11 LAB — CBC
HCT: 33.7 % — ABNORMAL LOW (ref 39.0–52.0)
HCT: 31.1 % — ABNORMAL LOW (ref 39.0–52.0)
Hemoglobin: 11 g/dL — ABNORMAL LOW (ref 13.0–17.0)
Hemoglobin: 10.3 g/dL — ABNORMAL LOW (ref 13.0–17.0)
MCH: 31.8 pg (ref 26.0–34.0)
MCH: 31.9 pg (ref 26.0–34.0)
MCHC: 32.6 g/dL (ref 30.0–36.0)
MCHC: 33.1 g/dL (ref 30.0–36.0)
MCV: 97.4 fL (ref 80.0–100.0)
MCV: 96.3 fL (ref 80.0–100.0)
Platelets: 202 10*3/uL (ref 150–400)
Platelets: 185 10*3/uL (ref 150–400)
RBC: 3.46 MIL/uL — ABNORMAL LOW (ref 4.22–5.81)
RBC: 3.23 MIL/uL — ABNORMAL LOW (ref 4.22–5.81)
RDW: 14.7 % (ref 11.5–15.5)
RDW: 14.6 % (ref 11.5–15.5)
WBC: 12.6 10*3/uL — ABNORMAL HIGH (ref 4.0–10.5)
WBC: 10.8 10*3/uL — ABNORMAL HIGH (ref 4.0–10.5)
nRBC: 0 % (ref 0.0–0.2)
nRBC: 0 % (ref 0.0–0.2)

## 2022-03-11 LAB — URINALYSIS, ROUTINE W REFLEX MICROSCOPIC
Bilirubin Urine: NEGATIVE
Glucose, UA: NEGATIVE mg/dL
Hgb urine dipstick: NEGATIVE
Ketones, ur: NEGATIVE mg/dL
Leukocytes,Ua: NEGATIVE
Nitrite: NEGATIVE
Protein, ur: NEGATIVE mg/dL
Specific Gravity, Urine: 1.02 (ref 1.005–1.030)
pH: 7 (ref 5.0–8.0)

## 2022-03-11 LAB — RESP PANEL BY RT-PCR (FLU A&B, COVID) ARPGX2
Influenza A by PCR: NEGATIVE
Influenza B by PCR: NEGATIVE
SARS Coronavirus 2 by RT PCR: NEGATIVE

## 2022-03-11 LAB — COMPREHENSIVE METABOLIC PANEL
ALT: 9 U/L (ref 0–44)
AST: 14 U/L — ABNORMAL LOW (ref 15–41)
Albumin: 3.3 g/dL — ABNORMAL LOW (ref 3.5–5.0)
Alkaline Phosphatase: 62 U/L (ref 38–126)
Anion gap: 5 (ref 5–15)
BUN: 24 mg/dL — ABNORMAL HIGH (ref 8–23)
CO2: 27 mmol/L (ref 22–32)
Calcium: 9.2 mg/dL (ref 8.9–10.3)
Chloride: 105 mmol/L (ref 98–111)
Creatinine, Ser: 1.56 mg/dL — ABNORMAL HIGH (ref 0.61–1.24)
GFR, Estimated: 46 mL/min — ABNORMAL LOW (ref >60)
Glucose, Bld: 91 mg/dL (ref 70–99)
Potassium: 4.2 mmol/L (ref 3.5–5.1)
Sodium: 137 mmol/L (ref 135–145)
Total Bilirubin: 0.4 mg/dL (ref 0.3–1.2)
Total Protein: 6.3 g/dL — ABNORMAL LOW (ref 6.5–8.1)

## 2022-03-11 LAB — GLUCOSE, CAPILLARY
Glucose-Capillary: 89 mg/dL (ref 70–99)
Glucose-Capillary: 115 mg/dL — ABNORMAL HIGH (ref 70–99)
Glucose-Capillary: 89 mg/dL (ref 70–99)

## 2022-03-11 MED ORDER — PANTOPRAZOLE SODIUM 40 MG IV SOLR
40.0000 mg | Freq: Two times a day (BID) | INTRAVENOUS | Status: DC
  Administered 2022-03-11 – 2022-03-12 (×4): 40 mg via INTRAVENOUS
  Filled 2022-03-11 (×4): qty 10

## 2022-03-11 NOTE — Consult Note (Signed)
? ?Referring Provider: EDP ?Primary Care Physician:  [name], MD ?Primary Gastroenterologist:  Dr. Henrene Pastor ? ?Reason for Consultation:  GI bleed ? ?HPI: Dean [name]. is a 76 y.o. male with history of CAD status post stenting (does not think that he is on Plavix at home, just ASA 325 mg daily), left lower extremity femoropopliteal bypass, chronic kidney disease stage III, chronic normocytic anemia, COPD, sleep apnea, infrarenal abdominal aortic aneurysm who presented to [facility] ED with complaints of rectal bleeding.  He tells me that this started on Wednesday night.  Had about 5 or 6 episodes before coming to the ED.  Had one episode in the ED.  Has not passed any blood since last night, but small amount, not anywhere near what it was.  He described it as being large amounts of red blood.  Says that his bowel habits tend to alternate between some constipation and diarrhea. ? ?CT scan of the abdomen and pelvis with contrast: ? ?IMPRESSION: ?Extensive sigmoid diverticulosis.  No acute diverticulitis. ?  ?Small recurrent umbilical hernia containing a nonobstructed loop of ?small bowel. ?  ?Mild bilateral renal pelvic fullness, with some higher density ?material in the right renal pelvis, recommend correlation with ?urinalysis. Cluster of nonobstructing stones in the lower pole the ?right kidney largest measuring 4 mm. Possible 1-2 mm stone at the ?right ureteropelvic junction. ?  ?Multiple cystic renal lesions bilaterally, too of which appear ?complex in the right mid and lower pole. Recommend nonemergent ?contrast enhanced renal protocol CT or MRI for further evaluation. ?  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 ?  ?Infrarenal abdominal aortic aneurysm measures 3.4 cm with mural ?thrombus resulting in moderate stenosis just proximal to the aortic ?bifurcation. Aneurysm previously measured up to 3.1 cm in March ?2021. Additional vascular findings as described above.  Recommend ?follow-up ultrasound every 3 years.  ? ? ?Says that he is hungry.  No nausea or vomiting.  No abdominal pain. ? ? ?Colonoscopy 06/2017: ? ?- Six 3 to 5 mm polyps in the rectum, in the descending colon, in the transverse colon and in ?the ascending colon, removed with a cold snare. Resected and retrieved. ?- Diverticulosis in the transverse colon and in the left colon. ?- Internal hemorrhoids. ?- The examination was otherwise normal on direct and retroflexion views. ? ?1. Surgical [P], ascending, polyps (2) ?- HYPERPLASTIC POLYP(S). ?- THERE IS NO EVIDENCE OF MALIGNANCY. ?2. Surgical [P], transverse, polyp ?- TUBULAR ADENOMA(S). ?- HIGH GRADE DYSPLASIA IS NOT IDENTIFIED. ?3. Surgical [P], descending, polyp ?- TUBULAR ADENOMA(S). ?- HIGH GRADE DYSPLASIA IS NOT IDENTIFIED. ?4. Surgical [P], rectum, polyps (2) ?- HYPERPLASTIC POLYP(S). ?- THERE IS NO EVIDENCE OF MALIGNANCY. ? ?Repeat recommended in 5 years. ? ? ?Past Medical History:  ?Diagnosis Date  ? AAA (abdominal aortic aneurysm)   ? infrarenal aaa 3.1 x 2.8 per abd/pelvis ct 02-26-20 epic  ? Allergy   ? pollen  ? Anginal pain ([place]) 2005  ? Arthritis   ? hips, back, neck   ? Asthma   ? Atrial fibrillation ([place])   ? no documentation in epic/ pt states has never been told has A Fib  ? CAD (coronary artery disease)   ? s/p angioplasty and stenting x3 1994, pt states has this done  ? Cataract   ? small, mild  ? Chronic kidney disease   ? kidney cyst  ? Complication of anesthesia   ? woke up during gall bladder sx yrs ago  and with colonscopy  ? Complication of anesthesia   ? pt thinks stopped breathing after gallbladder sx years ago, no record found  ? COPD (chronic obstructive pulmonary disease) ([place])   ? Dyspnea   ? Dysrhythmia   ? regular irregular heart beat  ? GERD (gastroesophageal reflux disease)   ? hx of  ? History of hiatal hernia   ? History of kidney stones   ? History of prediabetes   ? none in last 8 months since 40 lb weight loss  ?  Hyperlipidemia   ? Hypertension   ? Lower extremity edema since 02-14-2020  ? swelling goes down when sleeps resolved 06-29-20  ? Neuromuscular disorder ([place])   ? nerve issues with back   ? Peripheral vascular disease ([place])   ? PVC's (premature ventricular contractions)   ? pt denies  ? Sleep apnea   ? wears cpap auto set 5 to 20  ? Tuberculosis 1966  ? ? ?Past Surgical History:  ?Procedure Laterality Date  ? APPENDECTOMY    ? CHOLECYSTECTOMY  1990s  ? Gall Bladder  ? COLONOSCOPY    ? CORONARY ANGIOPLASTY WITH STENT PLACEMENT  1994  ? CYSTOSCOPY WITH INSERTION OF UROLIFT N/A 04/14/2020  ? Procedure: CYSTOSCOPY WITH INSERTION OF UROLIFT;  Surgeon: Festus Aloe, MD;  Location[facility];  Service: Urology;  Laterality: N/A;  ? CYSTOSCOPY WITH STENT PLACEMENT Right 07/07/2020  ? Procedure: CYSTOSCOPY WITH STENT CHANGE;  Surgeon: Festus Aloe, MD;  Location: [facility];  Service: Urology;  Laterality: Right;  ? CYSTOSCOPY WITH URETEROSCOPY AND STENT PLACEMENT Bilateral 01/03/2020  ? Procedure: CYSTOSCOPY WITH RETROGRADE/ URETEROSCOPY AND RIGHT STENT PLACEMENT BLADDER BIOPSY;  Surgeon: Festus Aloe, MD;  [facility];  Service: Urology;  Laterality: Bilateral;  ONLY NEEDS 30 MIN  ? CYSTOSCOPY WITH URETEROSCOPY AND STENT PLACEMENT Right 04/14/2020  ? Procedure: CYSTOSCOPY WITH URETEROSCOPY, RETROGRADE  AND STENT REPLACEMENT;  Surgeon: Festus Aloe, MD;  Location: [facility];  Service: Urology;  Laterality: Right;  ? EYE SURGERY Bilateral 2019  ? cataracts  ? lumbar disectomy  2005  ? L3 - 4 - 5  ? POLYPECTOMY    ? PR VEIN BYPASS GRAFT,AORTO-FEM-POP  08/31/11  ? Right to Left Fem-Fem  ? RENAL ANGIOGRAPHY N/A 02/14/2020  ? Procedure: RENAL ANGIOGRAPHY;  Surgeon: Elam Dutch, MD;  Location: [place] [facility];  Service: Cardiovascular;  Laterality: N/A;  Bilateral  ? ROBOT ASSISTED PYELOPLASTY Right 11/20/2020  ? Procedure: XI ROBOTIC  RIGHT ASSISTED PYELOPLASTY AND STENT PLACEMENT;  Surgeon: Ardis Hughs, MD;  Location: [facility];  Service: Urology;  Laterality: Right;  ? TONSILLECTOMY  as child  ? TONSILLECTOMY    ? TOTAL HIP ARTHROPLASTY Right 07/15/2020  ? Procedure: TOTAL HIP ARTHROPLASTY ANTERIOR APPROACH;  Surgeon: Gaynelle Arabian, MD;  Location: [facility];  Service: Orthopedics;  Laterality: Right;  116mn  ? UMBILICAL HERNIA REPAIR    ? x 2  ? ? ?Prior to Admission medications   ?Medication Sig Start Date End Date Taking? Authorizing Provider  ?albuterol (PROVENTIL) (2.5 MG/3ML) 0.083% nebulizer solution USE 1 VIAL VIA NEBULIZER EVERY 6 HOURS AS NEEDED FOR WHEEZING OR SHORTNESS OF BREATH ?Patient taking differently: Take 2.5 mg by nebulization every 6 (six) hours as needed for wheezing or shortness of breath. 07/03/19  Yes Young, CTarri FullerD, MD  ?albuterol (VENTOLIN HFA) 108 (90 Base) MCG/ACT inhaler INHALE 1 PUFF INTO THE LUNGS TWICE DAILY ?Patient taking differently:  1 puff every 6 (six) hours as needed for wheezing or shortness of breath. 04/26/21  Yes Young, Tarri Fuller D, MD  ?antiseptic oral rinse (BIOTENE) LIQD 15 mLs by Mouth Rinse route daily as needed for dry mouth.    Yes [provider]  ?aspirin 325 MG tablet Take 325 mg by mouth daily.   Yes [provider]  ?atorvastatin (LIPITOR) 10 MG tablet Take 10 mg by mouth at bedtime.   Yes [provider]  ?Azelastine-Fluticasone 137-50 MCG/ACT SUSP PLACE 1-2 SPRAYS INTO EACH NOSTRIL AT BEDTIME ?Patient taking differently: Place 1 spray into both nostrils at bedtime. 04/18/21  Yes Young, Tarri Fuller D, MD  ?diltiazem (DILACOR XR) 120 MG 24 hr capsule Take 1 capsule (120 mg total) by mouth daily. Take 1 capsule (120 mg total) by mouth daily. Schedule office visit for future refills ?Patient taking differently: Take 120 mg by mouth daily. 09/03/21  Yes Hilty, Nadean Corwin, MD  ?docusate sodium (COLACE) 100 MG capsule Take 100 mg by mouth daily.   Yes [provider]   ?esomeprazole (NEXIUM) 40 MG capsule Take 40 mg by mouth daily as needed (acid reflux/indigestion.).  02/07/19  Yes [provider]  ?finasteride (PROSCAR) 5 MG tablet Take 5 mg by mouth at bedtime.  2/1/

## 2022-03-11 NOTE — Progress Notes (Signed)
?PROGRESS NOTE ? ? ? ?[name].  YQM:[id number] DOB: 01/12/1946 DOA: 03/10/2022 ?PCP: [name], MD  ? ?Brief Narrative:  ?76 y.o. male with history of CAD status post stenting, left lower extremity femoropopliteal bypass, chronic kidney disease stage III, chronic anemia, COPD, sleep apnea, infrarenal abdominal aortic aneurysm presented with rectal bleeding.  On presentation, hemoglobin was around 12.  CT of the abdomen and pelvis showed diverticulosis and nonspecific findings in the kidneys.  GI was consulted by ED provider. ? ?Assessment & Plan: ?  ?Acute possible lower GI bleeding ?-Likely to be diverticular.  Colonoscopy in 2018 did show diverticulosis ?-CT of the abdomen and pelvis showed diverticulosis. ?-Continue empiric IV Protonix. ?-Monitor H&H.  Plavix on hold. ?-Awaiting GI evaluation. ? ?Anemia of chronic disease ?-Hemoglobin currently stable, 11 today. ? ?Leukocytosis ?-Possibly reactive.  Monitor ? ?CKD stage IIIa ?-Creatinine currently stable.  Monitor ? ?Abnormal findings in the kidneys ?Nephrolithiasis with possible ureterolithiasis ?-As seen on CT of the abdomen and pelvis.  Discussed with Dr. Bell/urologist via secure chat who recommended outpatient follow-up.  Patient states that he follows up with [facility] regularly.   ? ?Hypertension ?-Blood pressure stable.  Continue Cardizem, irbesartan and isosorbide mononitrate ? ?Hyperlipidemia ?-Continue statin ? ?COPD ?-Currently stable.  Continue current inhaled regimen. ? ?CAD status post stenting ?-No chest pain.  Continue statin.  Antiplatelet agents on hold.  Continue Cardizem, olmesartan and Imdur. ?-Outpatient follow-up with cardiology ? ?Sleep apnea ?-Continue CPAP at bedtime ? ?Peripheral vascular disease s/p femoropopliteal bypass ?-No active issues at this time.  Outpatient follow-up with vascular surgery ? ?Umbilical hernia with loop of bowel: Appears to be nonobstructed ?-Outpatient follow-up with general  surgery ? ? ? ?DVT prophylaxis: SCDs ?Code Status: Full ?Family Communication: None at bedside ?Disposition Plan: ?Status is: Observation ?The patient will require care spanning > 2 midnights and should be moved to inpatient because: Of need for possible GI intervention ? ? ? ?Consultants: GI ? ?Procedures: None ? ?Antimicrobials: None ? ? ?Subjective: ?Patient seen and examined at bedside.  Denies worsening abdominal pain.  No chest pain, fever or vomiting reported. ? ?Objective: ?Vitals:  ? 03/10/22 2250 03/11/22 0159 03/11/22 5284 03/11/22 0731  ?BP:  (!) 141/74 (!) 168/81   ?Pulse:  76 63   ?Resp:  20 20   ?Temp:  97.8 ?F (36.6 ?C) 98.2 ?F (36.8 ?C)   ?TempSrc:  Oral Oral   ?SpO2:  96% 90% 94%  ?Weight: 72.2 kg     ?Height:      ? ? ?Intake/Output Summary (Last 24 hours) at 03/11/2022 0736 ?Last data filed at 03/10/2022 2348 ?Gross per 24 hour  ?Intake 121 ml  ?Output --  ?Net 121 ml  ? ?Filed Weights  ? 03/10/22 2250  ?Weight: 72.2 kg  ? ? ?Examination: ? ?General exam: Appears calm and comfortable.  Currently on room air. ?Respiratory system: Bilateral decreased breath sounds at bases ?Cardiovascular system: S1 & S2 heard, Rate controlled ?Gastrointestinal system: Abdomen is nondistended, soft and nontender. Normal bowel sounds heard. ?Extremities: No cyanosis, clubbing, edema  ?Central nervous system: Alert and oriented. No focal neurological deficits. Moving extremities ?Skin: No rashes, lesions or ulcers ?Psychiatry: Judgement and insight appear normal. Mood & affect appropriate.  ? ? ? ?Data Reviewed: I have personally reviewed following labs and imaging studies ? ?CBC: ?Recent Labs  ?Lab 03/10/22 ?1307 03/10/22 ?2257 03/11/22 ?0325  ?WBC 14.6* 13.1* 12.6*  ?HGB 12.7* 12.1* 11.0*  ?HCT 39.0 37.0* 33.7*  ?  MCV 97.3 97.1 97.4  ?PLT 232 210 202  ? ?Basic Metabolic Panel: ?Recent Labs  ?Lab 03/10/22 ?1307 03/11/22 ?0325  ?NA 137 137  ?K 4.4 4.2  ?CL 104 105  ?CO2 27 27  ?GLUCOSE 125* 91  ?BUN 26* 24*  ?CREATININE  1.42* 1.56*  ?CALCIUM 9.2 9.2  ? ?GFR: ?Estimated Creatinine Clearance: 36.1 mL/min (A) (by C-G formula based on SCr of 1.56 mg/dL (H)). ?Liver Function Tests: ?Recent Labs  ?Lab 03/10/22 ?1307 03/11/22 ?0325  ?AST 12* 14*  ?ALT 11 9  ?ALKPHOS 72 62  ?BILITOT 0.2* 0.4  ?PROT 7.1 6.3*  ?ALBUMIN 3.7 3.3*  ? ?No results for input(s): LIPASE, AMYLASE in the last 168 hours. ?No results for input(s): AMMONIA in the last 168 hours. ?Coagulation Profile: ?No results for input(s): INR, PROTIME in the last 168 hours. ?Cardiac Enzymes: ?No results for input(s): CKTOTAL, CKMB, CKMBINDEX, TROPONINI in the last 168 hours. ?BNP (last 3 results) ?No results for input(s): PROBNP in the last 8760 hours. ?HbA1C: ?No results for input(s): HGBA1C in the last 72 hours. ?CBG: ?Recent Labs  ?Lab 03/10/22 ?2354  ?GLUCAP 114*  ? ?Lipid Profile: ?No results for input(s): CHOL, HDL, LDLCALC, TRIG, CHOLHDL, LDLDIRECT in the last 72 hours. ?Thyroid Function Tests: ?No results for input(s): TSH, T4TOTAL, FREET4, T3FREE, THYROIDAB in the last 72 hours. ?Anemia Panel: ?No results for input(s): VITAMINB12, FOLATE, FERRITIN, TIBC, IRON, RETICCTPCT in the last 72 hours. ?Sepsis Labs: ?No results for input(s): PROCALCITON, LATICACIDVEN in the last 168 hours. ? ?Recent Results (from the past 240 hour(s))  ?Resp Panel by RT-PCR (Flu A&B, Covid) Nasopharyngeal Swab     Status: None  ? Collection Time: 03/11/22  5:28 AM  ? Specimen: Nasopharyngeal Swab; Nasopharyngeal(NP) swabs in vial transport medium  ?Result Value Ref Range Status  ? SARS Coronavirus 2 by RT PCR NEGATIVE NEGATIVE Final  ?  Comment: (NOTE) ?SARS-CoV-2 target nucleic acids are NOT DETECTED. ? ?The SARS-CoV-2 RNA is generally detectable in upper respiratory ?specimens during the acute phase of infection. The lowest ?concentration of SARS-CoV-2 viral copies this assay can detect is ?138 copies/mL. A negative result does not preclude SARS-Cov-2 ?infection and should not be used as the sole  basis for treatment or ?other patient management decisions. A negative result may occur with  ?improper specimen collection/handling, submission of specimen other ?than nasopharyngeal swab, presence of viral mutation(s) within the ?areas targeted by this assay, and inadequate number of viral ?copies(<138 copies/mL). A negative result must be combined with ?clinical observations, patient history, and epidemiological ?information. The expected result is Negative. ? ?Fact Sheet for Patients:  ?EntrepreneurPulse.com.au ? ?Fact Sheet for Healthcare Providers:  ?IncredibleEmployment.be ? ?This test is no t yet approved or cleared by the [place] FDA and  ?has been authorized for detection and/or diagnosis of SARS-CoV-2 by ?FDA under an Emergency Use Authorization (EUA). This EUA will remain  ?in effect (meaning this test can be used) for the duration of the ?COVID-19 declaration under Section 564(b)(1) of the Act, 21 ?U.S.C.section 360bbb-3(b)(1), unless the authorization is terminated  ?or revoked sooner.  ? ? ?  ? Influenza A by PCR NEGATIVE NEGATIVE Final  ? Influenza B by PCR NEGATIVE NEGATIVE Final  ?  Comment: (NOTE) ?The Xpert Xpress SARS-CoV-2/FLU/RSV plus assay is intended as an aid ?in the diagnosis of influenza from Nasopharyngeal swab specimens and ?should not be used as a sole basis for treatment. Nasal washings and ?aspirates are unacceptable for Xpert Xpress SARS-CoV-2/FLU/RSV ?testing. ? ?  Fact Sheet for Patients: ?EntrepreneurPulse.com.au ? ?Fact Sheet for Healthcare Providers: ?IncredibleEmployment.be ? ?This test is not yet approved or cleared by the [place] FDA and ?has been authorized for detection and/or diagnosis of SARS-CoV-2 by ?FDA under an Emergency Use Authorization (EUA). This EUA will remain ?in effect (meaning this test can be used) for the duration of the ?COVID-19 declaration under Section 564(b)(1) of the Act,  21 U.S.C. ?section 360bbb-3(b)(1), unless the authorization is terminated or ?revoked. ? ?Performed at [facility], [address] ?[place], [place] [address] ?  ?  ? ? ? ? ? ?[facility]

## 2022-03-11 NOTE — Plan of Care (Signed)

## 2022-03-12 DIAGNOSIS — K625 Hemorrhage of anus and rectum: Secondary | ICD-10-CM

## 2022-03-12 DIAGNOSIS — K5731 Diverticulosis of large intestine without perforation or abscess with bleeding: Principal | ICD-10-CM

## 2022-03-12 DIAGNOSIS — D638 Anemia in other chronic diseases classified elsewhere: Secondary | ICD-10-CM | POA: Diagnosis not present

## 2022-03-12 DIAGNOSIS — N1831 Chronic kidney disease, stage 3a: Secondary | ICD-10-CM | POA: Diagnosis not present

## 2022-03-12 DIAGNOSIS — K922 Gastrointestinal hemorrhage, unspecified: Secondary | ICD-10-CM | POA: Diagnosis not present

## 2022-03-12 DIAGNOSIS — I10 Essential (primary) hypertension: Secondary | ICD-10-CM | POA: Diagnosis not present

## 2022-03-12 LAB — CBC WITH DIFFERENTIAL/PLATELET
Abs Immature Granulocytes: 0.09 10*3/uL — ABNORMAL HIGH (ref 0.00–0.07)
Basophils Absolute: 0.1 10*3/uL (ref 0.0–0.1)
Basophils Relative: 1 %
Eosinophils Absolute: 0.2 10*3/uL (ref 0.0–0.5)
Eosinophils Relative: 2 %
HCT: 27.2 % — ABNORMAL LOW (ref 39.0–52.0)
Hemoglobin: 9 g/dL — ABNORMAL LOW (ref 13.0–17.0)
Immature Granulocytes: 1 %
Lymphocytes Relative: 26 %
Lymphs Abs: 2.3 10*3/uL (ref 0.7–4.0)
MCH: 32.1 pg (ref 26.0–34.0)
MCHC: 33.1 g/dL (ref 30.0–36.0)
MCV: 97.1 fL (ref 80.0–100.0)
Monocytes Absolute: 0.8 10*3/uL (ref 0.1–1.0)
Monocytes Relative: 10 %
Neutro Abs: 5.1 10*3/uL (ref 1.7–7.7)
Neutrophils Relative %: 60 %
Platelets: 177 10*3/uL (ref 150–400)
RBC: 2.8 MIL/uL — ABNORMAL LOW (ref 4.22–5.81)
RDW: 14.5 % (ref 11.5–15.5)
WBC: 8.6 10*3/uL (ref 4.0–10.5)
nRBC: 0 % (ref 0.0–0.2)

## 2022-03-12 LAB — BASIC METABOLIC PANEL
Anion gap: 4 — ABNORMAL LOW (ref 5–15)
BUN: 21 mg/dL (ref 8–23)
CO2: 28 mmol/L (ref 22–32)
Calcium: 8.9 mg/dL (ref 8.9–10.3)
Chloride: 105 mmol/L (ref 98–111)
Creatinine, Ser: 1.59 mg/dL — ABNORMAL HIGH (ref 0.61–1.24)
GFR, Estimated: 45 mL/min — ABNORMAL LOW (ref >60)
Glucose, Bld: 79 mg/dL (ref 70–99)
Potassium: 3.9 mmol/L (ref 3.5–5.1)
Sodium: 137 mmol/L (ref 135–145)

## 2022-03-12 LAB — HEMOGLOBIN AND HEMATOCRIT, BLOOD
HCT: 27.8 % — ABNORMAL LOW (ref 39.0–52.0)
Hemoglobin: 9.1 g/dL — ABNORMAL LOW (ref 13.0–17.0)

## 2022-03-12 LAB — GLUCOSE, CAPILLARY: Glucose-Capillary: 82 mg/dL (ref 70–99)

## 2022-03-12 LAB — MAGNESIUM: Magnesium: 1.9 mg/dL (ref 1.7–2.4)

## 2022-03-12 MED ORDER — ASPIRIN 325 MG PO TABS: 325.0000 mg | ORAL_TABLET | Freq: Every day | ORAL | Status: DC

## 2022-03-12 MED ORDER — TRAMADOL HCL 50 MG PO TABS: 50.0000 mg | ORAL_TABLET | Freq: Four times a day (QID) | ORAL | Status: DC | PRN

## 2022-03-12 MED ORDER — ALBUTEROL SULFATE HFA 108 (90 BASE) MCG/ACT IN AERS: 1.0000 | INHALATION_SPRAY | Freq: Four times a day (QID) | RESPIRATORY_TRACT | Status: DC | PRN

## 2022-03-12 NOTE — Progress Notes (Signed)
? ? ? ? Progress Note ? ? Subjective  ?Patient states he feels well without complaints.  He denies any blood per rectum overnight.  Had bowel movement this morning which I saw and appears brown without any blood.  His hemoglobin has drifted down slightly this morning.  Otherwise feels okay. ? ? Objective  ? ?Vital signs in last 24 hours: ?Temp:  [97.8 ?F (36.6 ?C)-98 ?F (36.7 ?C)] 98 ?F (36.7 ?C) (03/25 0518) ?Pulse Rate:  [51-64] 56 (03/25 0518) ?Resp:  [16-18] 16 (03/25 0518) ?BP: (120-142)/(57-78) 131/64 (03/25 0518) ?SpO2:  [91 %-95 %] 91 % (03/25 0818) ?Weight:  [73.4 kg] 73.4 kg (03/25 0500) ?Last BM Date : 03/11/22 ?General:    white male in NAD ?Neurologic:  Alert and oriented,  grossly normal neurologically. ?Psych:  Cooperative. Normal mood and affect. ? ?Intake/Output from previous day: ?03/24 0701 - 03/25 0700 ?In: 3476.1 [P.O.:2366; I.V.:1110.1] ?Out: -  ?Intake/Output this shift: ?No intake/output data recorded. ? ?Lab Results: ?Recent Labs  ?  03/11/22 ?0325 03/11/22 ?0806 03/12/22 ?0543  ?WBC 12.6* 10.8* 8.6  ?HGB 11.0* 10.3* 9.0*  ?HCT 33.7* 31.1* 27.2*  ?PLT 202 185 177  ? ?BMET ?Recent Labs  ?  03/10/22 ?1307 03/11/22 ?0325 03/12/22 ?0543  ?NA 137 137 137  ?K 4.4 4.2 3.9  ?CL 104 105 105  ?CO2 '27 27 28  '$ ?GLUCOSE 125* 91 79  ?BUN 26* 24* 21  ?CREATININE 1.42* 1.56* 1.59*  ?CALCIUM 9.2 9.2 8.9  ? ?LFT ?Recent Labs  ?  03/11/22 ?0325  ?PROT 6.3*  ?ALBUMIN 3.3*  ?AST 14*  ?ALT 9  ?ALKPHOS 62  ?BILITOT 0.4  ? ?PT/INR ?No results for input(s): LABPROT, INR in the last 72 hours. ? ?Studies/Results: ?CT Abdomen Pelvis W Contrast ? ?Result Date: 03/10/2022 ?CLINICAL DATA:  Abdominal pain, acute, nonlocalized bright red blood per rectum. EXAM: CT ABDOMEN AND PELVIS WITH CONTRAST TECHNIQUE: Multidetector CT imaging of the abdomen and pelvis was performed using the standard protocol following bolus administration of intravenous contrast. RADIATION DOSE REDUCTION: This exam was performed according to the  departmental dose-optimization program which includes automated exposure control, adjustment of the mA and/or kV according to patient size and/or use of iterative reconstruction technique. CONTRAST:  171m OMNIPAQUE IOHEXOL 300 MG/ML  SOLN COMPARISON:  CT 02/26/2020. FINDINGS: Lower chest: Centrilobular emphysema.  No acute abnormality. Hepatobiliary: No focal liver abnormality is seen. Prior cholecystectomy. Pancreas: Unremarkable. No pancreatic ductal dilatation or surrounding inflammatory changes. Spleen: Normal in size without focal abnormality. Adrenals/Urinary Tract: Bilateral renal pelvic fullness. There is some higher density material in the right renal pelvis. The ureters appear decompressed. There are multiple simple appearing renal cysts bilaterally multiple left-sided renal sinus cysts. There are renal vascular calcifications bilaterally. There is a cluster of stones in the lower pole the right kidney largest measuring 4 mm. There is a 1-2 mm possible stone at the right ureteropelvic junction (series 2, image 36). There is an indeterminate complex cystic lesion in the lower pole the right kidney measuring 1.6 x 1.2. (Series 2, image 37). There is a minimally complex cyst in the right mid kidney measuring 1.5 x 1.2 cm with thin internal septation (series 2, image 33). The bladder is unremarkable. Stomach/Bowel: The stomach is within normal limits. There is no evidence of bowel obstruction.The appendix is surgically absent. Extensive sigmoid diverticulosis. No acute diverticulitis. Vascular/Lymphatic: Extensive mixed atherosclerosis of the abdominal aorta and branch vessels. Mild stenosis at the celiac ostia. Moderate stenosis at the SMA  origin. The IMA is poorly opacified. There is an infrarenal abdominal aortic aneurysm measuring 3.4 x 3.0 cm, previously 3.1 x 2.8 cm. There is extensive mural thrombus resulting in moderate stenosis of the distal aorta just proximal to the aortic bifurcation. There are  bilateral common iliac stents with occlusion of the left internal and external iliac arteries. There is a fem-fem bypass graft which is patent and supplies the left superficial femoral and profundus femoris arteries. The right common femoral, proximal SFA and profundus arteries are patent. Patent right common iliac stent. Reproductive: Postprocedural changes of the prostate. Other: There is a small recurrent umbilical hernia containing a nonobstructed loop of small bowel. No abdominopelvic ascites. No free air. Musculoskeletal: Multilevel degenerative changes of the spine worst at L3-L4 and L4-L5. No acute osseous abnormality. 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 Mild left hip osteoarthritis. Prior right hip arthroplasty in normal alignment without evidence of complication. IMPRESSION: Extensive sigmoid diverticulosis.  No acute diverticulitis. Small recurrent umbilical hernia containing a nonobstructed loop of small bowel. Mild bilateral renal pelvic fullness, with some higher density material in the right renal pelvis, recommend correlation with urinalysis. Cluster of nonobstructing stones in the lower pole the right kidney largest measuring 4 mm. Possible 1-2 mm stone at the right ureteropelvic junction. Multiple cystic renal lesions bilaterally, too of which appear complex in the right mid and lower pole. Recommend nonemergent contrast enhanced renal protocol CT or MRI for further evaluation. 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 Infrarenal abdominal aortic aneurysm measures 3.4 cm with mural thrombus resulting in moderate stenosis just proximal to the aortic bifurcation. Aneurysm previously measured up to 3.1 cm in March 2021. Additional vascular findings as described above. Recommend follow-up ultrasound every 3 years. This recommendation follows ACR consensus guidelines: White Paper of the ACR Incidental Findings  Committee II on Vascular Findings. J Am Coll Radiol 2013; 10:789-794. Electronically Signed   By: Maurine Simmering M.D.   On: 03/10/2022 17:45   ? ? ? ? Assessment / Plan:   ? 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 Multiple episodes of painless hematochezia starting Wed night which seems to have resolved with time.  Hemoglobin has down trended from admission of 12.7 to 10.3 and now in 9s this AM, however some of that is likely re-equilibration given he has not had any overt bleeding since I saw him yesterday. CT scan with contrast yesterday showed no extravasation.   ? ?We discussed that he likely has diverticular related bleeding that appears to have resolved on its own.  I think he can eat a regular diet this morning and see how he does.  We will repeat hemoglobin later this afternoon.  If his hemoglobin is stable later today then may be we can discharge him later this afternoon.  If Hgb continues to drift down we may watch him overnight to make sure okay.  We discussed pursuing a colonoscopy as an outpatient for his history of colon polyps in the upcoming months, if he is otherwise discharged without problems in the interim. If he is discharged today, he can resume his aspirin in the next 2 days or so. ? ?He agrees with the plan.  Call with questions in the interim ? ?Jolly Mango, MD ?[facility] ? ? ? ?

## 2022-03-12 NOTE — Progress Notes (Signed)
Pt discharging home. PIV removed. AVS printed and educational teaching completed with teach back method. No further questions at this time. ?

## 2022-03-12 NOTE — Discharge Summary (Signed)
Physician Discharge Summary  ?L[employer]. ONG:[id number] DOB: 1946-12-05 DOA: 03/10/2022 ? ?PCP: [name], MD ? ?Admit date: 03/10/2022 ?Discharge date: 03/12/2022 ? ?Admitted From: Home ?Disposition: Home ? ?Recommendations for Outpatient Follow-up:  ?Follow up with PCP in 1 week with repeat CBC/BMP ?Outpatient follow-up with GI.  Resume aspirin from 03/15/2022 onwards as per GI recommendations ?Follow up in ED if symptoms worsen or new appear ? ? ?Home Health: No ?Equipment/Devices: None ? ?Discharge Condition: Stable ?CODE STATUS: Full ?Diet recommendation: Heart healthy ? ?Brief/Interim Summary: ?76 y.o. male with history of CAD status post stenting, left lower extremity femoropopliteal bypass, chronic kidney disease stage III, chronic anemia, COPD, sleep apnea, infrarenal abdominal aortic aneurysm presented with rectal bleeding.  On presentation, hemoglobin was around 12.  CT of the abdomen and pelvis showed diverticulosis and nonspecific findings in the kidneys.  GI was consulted by ED provider.  During the hospitalization, he was managed conservatively.  H&H was monitored.  He has not had any more bleeding since admission.  H&H is stable.  Diet is being advanced.  If repeat H&H remains stable this afternoon, he will be discharged home today.  Aspirin can be resumed from 03/15/2022 onwards as per GI.  Outpatient follow-up with PCP and GI. ? ?Discharge Diagnoses:  ? ?Acute possible lower GI bleeding ?-Likely to be diverticular.  Colonoscopy in 2018 did show diverticulosis ?-CT of the abdomen and pelvis showed diverticulosis. ?-aspirin on hold. ?-GI following.  Patient is currently being managed conservatively ?-No bleeding episodes since admission.  Hemoglobin has trended down slightly: Hemoglobin 9 today. ?-Diet is being advanced.  If repeat H&H remains stable this afternoon, he will be discharged home today.  Aspirin can be resumed from 03/15/2022 onwards as per GI.  Outpatient follow-up with PCP  and GI. ? ?anemia of chronic disease ?-Plan as above.  Outpatient follow-up.. ? ?Leukocytosis ?-Possibly reactive.  Resolved ? ?CKD stage IIIa ?-Creatinine currently stable.  Outpatient follow-up ?  ?Abnormal findings in the kidneys ?Nephrolithiasis with possible ureterolithiasis ?-As seen on CT of the abdomen and pelvis.  Discussed with Dr. Bell/urologist via secure chat who recommended outpatient follow-up.  Patient states that he follows up with [facility] regularly.   ?  ?Hypertension ?-Blood pressure stable.  Continue home regimen. ? ?Hyperlipidemia ?-Continue statin ? ?COPD ?-Currently stable.  Continue current inhaled regimen. ? ?CAD status post stenting ?-No chest pain.  Continue statin.  Antiplatelet plan as above.  Continue on her antihypertensive regimen. ?-Outpatient follow-up with cardiology ?  ?Sleep apnea ?-Continue CPAP at bedtime ?  ?Peripheral vascular disease s/p femoropopliteal bypass ?-No active issues at this time.  Outpatient follow-up with vascular surgery ?  ?Umbilical hernia with loop of bowel: Appears to be nonobstructed ?-Outpatient follow-up with general surgery ?  ? ? ?Discharge Instructions ? ? ?Allergies as of 03/12/2022   ? ?   Reactions  ? Penicillins   ? Childhood reaction ?REACTION: rapid pulse ?Did it involve swelling of the face/tongue/throat, SOB, or low BP? Yes ?Did it involve sudden or severe rash/hives, skin peeling, or any reaction on the inside of your mouth or nose? Unknown ?Did you need to seek medical attention at a hospital or doctor's office? yes ?When did it last happen?      childhood  ?If all above answers are "NO", may proceed with cephalosporin use.  ? Fish Oil   ? Other reaction(s): Heart attack symptoms  ? Other   ? Oily seafood salmon-- chest pain  ?  Penicillin G   ? Other reaction(s): Unknown  ? Mold Extract [trichophyton] Rash  ? ?  ? ?  ?Medication List  ?  ? ?STOP taking these medications   ? ?amphetamine-dextroamphetamine 10 MG tablet ?Commonly known  as: Adderall ?  ? ?  ? ?TAKE these medications   ? ?albuterol (2.5 MG/3ML) 0.083% nebulizer solution ?Commonly known as: PROVENTIL ?USE 1 VIAL VIA NEBULIZER EVERY 6 HOURS AS NEEDED FOR WHEEZING OR SHORTNESS OF BREATH ?What changed:  ?how much to take ?how to take this ?when to take this ?reasons to take this ?additional instructions ?  ?albuterol 108 (90 Base) MCG/ACT inhaler ?Commonly known as: VENTOLIN HFA ?Inhale 1 puff into the lungs every 6 (six) hours as needed for wheezing or shortness of breath. ?What changed: See the new instructions. ?  ?antiseptic oral rinse Liqd ?15 mLs by Mouth Rinse route daily as needed for dry mouth. ?  ?aspirin 325 MG tablet ?Take 1 tablet (325 mg total) by mouth daily. Restart from 03/15/2022 ?Start taking on: March 15, 2022 ?What changed:  ?additional instructions ?These instructions start on March 15, 2022. If you are unsure what to do until then, ask your doctor or other care provider. ?  ?atorvastatin 10 MG tablet ?Commonly known as: LIPITOR ?Take 10 mg by mouth at bedtime. ?  ?Azelastine-Fluticasone 137-50 MCG/ACT Susp ?PLACE 1-2 SPRAYS INTO EACH NOSTRIL AT BEDTIME ?What changed:  ?See the new instructions. ?Another medication with the same name was removed. Continue taking this medication, and follow the directions you see here. ?  ?BLUE-EMU MAXIMUM STRENGTH EX ?Apply 1 application topically 3 (three) times daily as needed (pain.). ?  ?diltiazem 120 MG 24 hr capsule ?Commonly known as: DILACOR XR ?Take 1 capsule (120 mg total) by mouth daily. Take 1 capsule (120 mg total) by mouth daily. Schedule office visit for future refills ?What changed: additional instructions ?  ?docusate sodium 100 MG capsule ?Commonly known as: COLACE ?Take 100 mg by mouth daily. ?  ?esomeprazole 40 MG capsule ?Commonly known as: [place] ?Take 40 mg by mouth daily as needed (acid reflux/indigestion.). ?  ?finasteride 5 MG tablet ?Commonly known as: PROSCAR ?Take 5 mg by mouth at bedtime. ?  ?irbesartan  150 MG tablet ?Commonly known as: AVAPRO ?Take 150 mg by mouth every evening. ?  ?isosorbide mononitrate 60 MG 24 hr tablet ?Commonly known as: IMDUR ?Take 60 mg by mouth every evening. ?  ?magnesium oxide 400 MG tablet ?Commonly known as: MAG-OX ?Take 400 mg by mouth every other day. ?  ?simethicone 125 MG chewable tablet ?Commonly known as: MYLICON ?Chew 125 mg by mouth every 6 (six) hours as needed for flatulence. Takes gas x ?  ?Stiolto Respimat 2.5-2.5 MCG/ACT Aers ?Generic drug: Tiotropium Bromide-Olodaterol ?Inhale 2 puffs into the lungs daily. ?  ?SYSTANE ULTRA OP ?Place 1 drop into both eyes daily as needed (Dry eye). ?  ?traMADol 50 MG tablet ?Commonly known as: ULTRAM ?Take 1 tablet (50 mg total) by mouth every 6 (six) hours as needed for moderate pain. ?  ?Trelegy Ellipta 200-62.5-25 MCG/ACT Aepb ?Generic drug: Fluticasone-Umeclidin-Vilant ?Inhale 1 puff into the lungs daily. ?  ? ?  ? ? ? ?Allergies  ?Allergen Reactions  ? Penicillins   ?  Childhood reaction ? ?REACTION: rapid pulse ?Did it involve swelling of the face/tongue/throat, SOB, or low BP? Yes ?Did it involve sudden or severe rash/hives, skin peeling, or any reaction on the inside of your mouth or nose? Unknown ?Did you  need to seek medical attention at a hospital or doctor's office? yes ?When did it last happen?      childhood  ?If all above answers are "NO", may proceed with cephalosporin use. ?  ? Fish Oil   ?  Other reaction(s): Heart attack symptoms  ? Other   ?  Oily seafood salmon-- chest pain  ? Penicillin G   ?  Other reaction(s): Unknown  ? Mold Extract [Trichophyton] Rash  ? ? ?Consultations: ?Gastroenterology ? ? ?Procedures/Studies: ?CT Abdomen Pelvis W Contrast ? ?Result Date: 03/10/2022 ?CLINICAL DATA:  Abdominal pain, acute, nonlocalized bright red blood per rectum. EXAM: CT ABDOMEN AND PELVIS WITH CONTRAST TECHNIQUE: Multidetector CT imaging of the abdomen and pelvis was performed using the standard protocol following bolus  administration of intravenous contrast. RADIATION DOSE REDUCTION: This exam was performed according to the departmental dose-optimization program which includes automated exposure control, adjustment of

## 2022-03-14 LAB — GLUCOSE, CAPILLARY: Glucose-Capillary: 70 mg/dL (ref 70–99)

## 2022-03-20 ENCOUNTER — Other Ambulatory Visit: Payer: Self-pay

## 2022-03-20 DIAGNOSIS — I714 Abdominal aortic aneurysm, without rupture, unspecified: Secondary | ICD-10-CM

## 2022-03-20 DIAGNOSIS — I739 Peripheral vascular disease, unspecified: Secondary | ICD-10-CM

## 2022-03-21 DIAGNOSIS — D649 Anemia, unspecified: Secondary | ICD-10-CM | POA: Diagnosis not present

## 2022-03-21 DIAGNOSIS — E785 Hyperlipidemia, unspecified: Secondary | ICD-10-CM | POA: Diagnosis not present

## 2022-03-21 DIAGNOSIS — J449 Chronic obstructive pulmonary disease, unspecified: Secondary | ICD-10-CM | POA: Diagnosis not present

## 2022-03-21 DIAGNOSIS — I7143 Infrarenal abdominal aortic aneurysm, without rupture: Secondary | ICD-10-CM | POA: Diagnosis not present

## 2022-03-21 DIAGNOSIS — K922 Gastrointestinal hemorrhage, unspecified: Secondary | ICD-10-CM | POA: Diagnosis not present

## 2022-03-21 DIAGNOSIS — M87052 Idiopathic aseptic necrosis of left femur: Secondary | ICD-10-CM | POA: Diagnosis not present

## 2022-03-21 DIAGNOSIS — N200 Calculus of kidney: Secondary | ICD-10-CM | POA: Diagnosis not present

## 2022-03-21 DIAGNOSIS — N1831 Chronic kidney disease, stage 3a: Secondary | ICD-10-CM | POA: Diagnosis not present

## 2022-03-21 DIAGNOSIS — I110 Hypertensive heart disease with heart failure: Secondary | ICD-10-CM | POA: Diagnosis not present

## 2022-03-24 ENCOUNTER — Ambulatory Visit (INDEPENDENT_AMBULATORY_CARE_PROVIDER_SITE_OTHER)
Admission: RE | Admit: 2022-03-24 | Discharge: 2022-03-24 | Disposition: A | Discharge status: Home / Self Care | Payer: Medicare PPO | Source: Ambulatory Visit | Attending: Physician Assistant | Admitting: Physician Assistant

## 2022-03-24 ENCOUNTER — Ambulatory Visit (HOSPITAL_COMMUNITY)
Admission: RE | Admit: 2022-03-24 | Discharge: 2022-03-24 | Disposition: A | Discharge status: Home / Self Care | Payer: Medicare PPO | Source: Ambulatory Visit | Attending: Physician Assistant | Admitting: Physician Assistant

## 2022-03-24 ENCOUNTER — Encounter (HOSPITAL_COMMUNITY): Payer: Self-pay

## 2022-03-24 ENCOUNTER — Ambulatory Visit: Payer: Medicare PPO | Admitting: Physician Assistant

## 2022-03-24 VITALS — BP 127/65 | HR 54 | Temp 98.0°F | Resp 20 | Ht 65.0 in | Wt 164.1 lb

## 2022-03-24 DIAGNOSIS — I714 Abdominal aortic aneurysm, without rupture, unspecified: Secondary | ICD-10-CM | POA: Insufficient documentation

## 2022-03-24 DIAGNOSIS — I739 Peripheral vascular disease, unspecified: Secondary | ICD-10-CM | POA: Diagnosis not present

## 2022-03-24 DIAGNOSIS — F17200 Nicotine dependence, unspecified, uncomplicated: Secondary | ICD-10-CM | POA: Diagnosis not present

## 2022-03-24 DIAGNOSIS — I701 Atherosclerosis of renal artery: Secondary | ICD-10-CM | POA: Diagnosis not present

## 2022-03-24 NOTE — Progress Notes (Signed)
?Office Note  ? ? ? ?CC:  follow up ?Requesting Provider:  [name], MD ? ?HPI: Dean [name]. is a 76 y.o. ([birth year]) male who presents for surveillance of AAA and PAD.  He recently experienced bright red blood per rectum and underwent CT oral contrasted scan of abdomen and pelvis.  AAA measured 3.4 cm which is an increase from 3.1 cm by ultrasound several years prior.  He denies any new or changing abdominal or back pain.  He also denies any tissue changes of bilateral lower extremities.  He is scheduled for colonoscopy as part of his work-up for GI bleed. ? ?Surgical history also significant for bilateral common iliac artery stenting.  After left common iliac stent occluded he underwent right to left femoral to femoral bypass by Dr. Kellie Simmering many years ago.  Patient denies claudication or rest pain of bilateral lower extremities he also denies any ulcerations of bilateral lower extremities.  Mobility is limited by his hip pain related to osteoarthritis.  He is an everyday smoker with no interest in quitting. ? ?In the past patient has also been worked up for renal artery restenosis given hypertension and declining renal function.  Patient states CKD is stable.  He is also normotensive in office today ? ?Past Medical History:  ?Diagnosis Date  ? AAA (abdominal aortic aneurysm) ([place])   ? infrarenal aaa 3.1 x 2.8 per abd/pelvis ct 02-26-20 epic  ? Allergy   ? pollen  ? Anginal pain ([place]) 2005  ? Arthritis   ? hips, back, neck   ? Asthma   ? Atrial fibrillation ([place])   ? no documentation in epic/ pt states has never been told has A Fib  ? CAD (coronary artery disease)   ? s/p angioplasty and stenting x3 1994, pt states has this done  ? Cataract   ? small, mild  ? Chronic kidney disease   ? kidney cyst  ? Complication of anesthesia   ? woke up during gall bladder sx yrs ago and with colonscopy  ? Complication of anesthesia   ? pt thinks stopped breathing after gallbladder sx years ago, no record found  ?  COPD (chronic obstructive pulmonary disease) ([place])   ? Dyspnea   ? Dysrhythmia   ? regular irregular heart beat  ? GERD (gastroesophageal reflux disease)   ? hx of  ? History of hiatal hernia   ? History of kidney stones   ? History of prediabetes   ? none in last 8 months since 40 lb weight loss  ? Hyperlipidemia   ? Hypertension   ? Lower extremity edema since 02-14-2020  ? swelling goes down when sleeps resolved 06-29-20  ? Neuromuscular disorder ([place])   ? nerve issues with back   ? Peripheral vascular disease ([place])   ? PVC's (premature ventricular contractions)   ? pt denies  ? Sleep apnea   ? wears cpap auto set 5 to 20  ? Tuberculosis 1966  ? ? ?Past Surgical History:  ?Procedure Laterality Date  ? APPENDECTOMY    ? CHOLECYSTECTOMY  1990s  ? Gall Bladder  ? COLONOSCOPY    ? CORONARY ANGIOPLASTY WITH STENT PLACEMENT  1994  ? CYSTOSCOPY WITH INSERTION OF UROLIFT N/A 04/14/2020  ? Procedure: CYSTOSCOPY WITH INSERTION OF UROLIFT;  Surgeon: Festus Aloe, MD;  Location: [facility];  Service: Urology;  Laterality: N/A;  ? CYSTOSCOPY WITH STENT PLACEMENT Right 07/07/2020  ? Procedure: CYSTOSCOPY WITH STENT CHANGE;  Surgeon: Festus Aloe,  MD;  Location: [facility];  Service: Urology;  Laterality: Right;  ? CYSTOSCOPY WITH URETEROSCOPY AND STENT PLACEMENT Bilateral 01/03/2020  ? Procedure: CYSTOSCOPY WITH RETROGRADE/ URETEROSCOPY AND RIGHT STENT PLACEMENT BLADDER BIOPSY;  Surgeon: Festus Aloe, MD;  [facility];  Service: Urology;  Laterality: Bilateral;  ONLY NEEDS 30 MIN  ? CYSTOSCOPY WITH URETEROSCOPY AND STENT PLACEMENT Right 04/14/2020  ? Procedure: CYSTOSCOPY WITH URETEROSCOPY, RETROGRADE  AND STENT REPLACEMENT;  Surgeon: Festus Aloe, MD;  Location: [facility];  Service: Urology;  Laterality: Right;  ? EYE SURGERY Bilateral 2019  ? cataracts  ? lumbar disectomy  2005  ? L3 - 4 - 5  ? POLYPECTOMY    ? PR VEIN BYPASS  GRAFT,AORTO-FEM-POP  08/31/11  ? Right to Left Fem-Fem  ? RENAL ANGIOGRAPHY N/A 02/14/2020  ? Procedure: RENAL ANGIOGRAPHY;  Surgeon: Elam Dutch, MD;  Location: [facility];  Service: Cardiovascular;  Laterality: N/A;  Bilateral  ? ROBOT ASSISTED PYELOPLASTY Right 11/20/2020  ? Procedure: XI ROBOTIC RIGHT ASSISTED PYELOPLASTY AND STENT PLACEMENT;  Surgeon: Ardis Hughs, MD;  Location: [facility];  Service: Urology;  Laterality: Right;  ? TONSILLECTOMY  as child  ? TONSILLECTOMY    ? TOTAL HIP ARTHROPLASTY Right 07/15/2020  ? Procedure: TOTAL HIP ARTHROPLASTY ANTERIOR APPROACH;  Surgeon: Gaynelle Arabian, MD;  Location: [facility];  Service: Orthopedics;  Laterality: Right;  130mn  ? UMBILICAL HERNIA REPAIR    ? x 2  ? ? ?Social History  ? ?Socioeconomic History  ? Marital status: Single  ?  Spouse name: Not on file  ? Number of children: Not on file  ? Years of education: Not on file  ? Highest education level: Not on file  ?Occupational History  ? 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 Smoking status: Every Day  ?  Packs/day: 1.00  ?  Years: 51.00  ?  Pack years: 51.00  ?  Types: Cigarettes  ?  Passive exposure: Never  ? Smokeless tobacco: Never  ? Tobacco comments:  ?  Pt smoke 1.5 packs daily [facility] 12/30/2021  ?Vaping Use  ? Vaping Use: Former  ?Substance and Sexual Activity  ? Alcohol use: Not Currently  ?  Alcohol/week: 0.0 standard drinks  ? Drug use: No  ? Sexual activity: Not on file  ?Other Topics Concern  ? Not on file  ?Social History Narrative  [id number]  ? ?Social Determinants of Health  ? ?Financial Resource Strain: Not on file  ?Food Insecurity: Not on file  ?Transportation Needs: Not on file  ?Physical Activity: Not on file  ?Stress: Not on file  ?Social Connections: Not on file  ?Intimate Partner Violence: Not on file  ? ? ?Family History  ?Problem Relation Age of Onset  ? Other Father   ?     ETOH  ? Diabetes  Father   ? Hypertension Father   ? Kidney cancer Mother   ? Cancer Mother   ?     renal  ? Hyperlipidemia Mother   ? Hypertension Mother   ? Cancer Sister   ?     breast  ? Hyperlipidemia Sister   ? Hypertension Sister   ? Breast cancer Sister   ? Hyperlipidemia Brother   ? Hypertension Brother   ? Prostate cancer Brother   ? Colon cancer Neg Hx   ? Colon polyps  Neg Hx   ? Esophageal cancer Neg Hx   ? Rectal cancer Neg Hx   ? Stomach cancer Neg Hx   ? ? ?Current Outpatient Medications  ?Medication Sig Dispense Refill  ? albuterol (PROVENTIL) (2.5 MG/3ML) 0.083% nebulizer solution USE 1 VIAL VIA NEBULIZER EVERY 6 HOURS AS NEEDED FOR WHEEZING OR SHORTNESS OF BREATH (Patient taking differently: Take 2.5 mg by nebulization every 6 (six) hours as needed for wheezing or shortness of breath.) 225 mL 12  ? albuterol (VENTOLIN HFA) 108 (90 Base) MCG/ACT inhaler Inhale 1 puff into the lungs every 6 (six) hours as needed for wheezing or shortness of breath.    ? antiseptic oral rinse (BIOTENE) LIQD 15 mLs by Mouth Rinse route daily as needed for dry mouth.     ? aspirin 325 MG tablet Take 1 tablet (325 mg total) by mouth daily. Restart from 03/15/2022    ? atorvastatin (LIPITOR) 10 MG tablet Take 10 mg by mouth at bedtime.    ? Azelastine-Fluticasone 137-50 MCG/ACT SUSP PLACE 1-2 SPRAYS INTO EACH NOSTRIL AT BEDTIME (Patient taking differently: Place 1 spray into both nostrils at bedtime.) 23 g 3  ? diltiazem (DILACOR XR) 120 MG 24 hr capsule Take 1 capsule (120 mg total) by mouth daily. Take 1 capsule (120 mg total) by mouth daily. Schedule office visit for future refills (Patient taking differently: Take 120 mg by mouth daily.) 30 capsule 0  ? docusate sodium (COLACE) 100 MG capsule Take 100 mg by mouth daily.    ? esomeprazole (NEXIUM) 40 MG capsule Take 40 mg by mouth daily as needed (acid reflux/indigestion.).     ? finasteride (PROSCAR) 5 MG tablet Take 5 mg by mouth at bedtime.     ? Fluticasone-Umeclidin-Vilant (TRELEGY  ELLIPTA) 200-62.5-25 MCG/ACT AEPB Inhale 1 puff into the lungs daily.    ? irbesartan (AVAPRO) 150 MG tablet Take 150 mg by mouth every evening.     ? isosorbide mononitrate (IMDUR) 60 MG 24 hr tablet Take 60 mg by

## 2022-03-29 ENCOUNTER — Encounter: Payer: Self-pay | Admitting: Gastroenterology

## 2022-03-29 ENCOUNTER — Telehealth: Payer: Self-pay | Admitting: Gastroenterology

## 2022-03-29 ENCOUNTER — Ambulatory Visit: Payer: Medicare PPO | Admitting: Gastroenterology

## 2022-03-29 VITALS — BP 140/54 | HR 56 | Ht 63.5 in | Wt 171.2 lb

## 2022-03-29 DIAGNOSIS — K922 Gastrointestinal hemorrhage, unspecified: Secondary | ICD-10-CM

## 2022-03-29 DIAGNOSIS — Z860100 Personal history of colon polyps, unspecified: Secondary | ICD-10-CM | POA: Insufficient documentation

## 2022-03-29 MED ORDER — NA SULFATE-K SULFATE-MG SULF 17.5-3.13-1.6 GM/177ML PO SOLN: 1.0000 | Freq: Once | ORAL | 0 refills | Status: AC

## 2022-03-29 NOTE — Progress Notes (Signed)
Assessment and plan noted ?

## 2022-03-29 NOTE — Patient Instructions (Addendum)
You have been scheduled for a colonoscopy. Please follow written instructions given to you at your visit today.  ?Please pick up your prep supplies at the pharmacy within the next 1-3 days. ?If you use inhalers (even only as needed), please bring them with you on the day of your procedure. ? ?If you are age 75 or older, your body mass index should be between 23-30. Your Body mass index is 29.86 kg/m?Marland Kitchen If this is out of the aforementioned range listed, please consider follow up with your Primary Care Provider. ?________________________________________________________ ? ?The [facility] providers would like to encourage you to use [facility] to communicate with providers for non-urgent requests or questions.  Due to long hold times on the telephone, sending your provider a message by [facility] may be a faster and more efficient way to get a response.  Please allow 48 business hours for a response.  Please remember that this is for non-urgent requests.  ?_______________________________________________________ ? ?Please consider starting a daily fiber supplement such as Metamucil or fiber, 2 teaspoons mixed in 8 ounces of liquid daily.  Could also consider adding a dose of MiraLAX into that regimen as well. ?

## 2022-03-29 NOTE — Telephone Encounter (Signed)
Inbound call from patient pharmacy. Reports Surprep is not covered by insurance. They states Clenpiq would be an alternative that is covered ?

## 2022-03-29 NOTE — Progress Notes (Signed)
? ? ? ?03/29/2022 ?Dean [name]. ?[id number] ?05-12-1946 ? ? ?HISTORY OF PRESENT ILLNESS: This is a 76 year old male who is a patient of Dr. Blanch Media.  He has a history of coronary artery disease status post stenting only on ASA 325 mg daily, left lower extremity femoropopliteal bypass, chronic kidney disease stage III, chronic normocytic anemia, COPD, sleep apnea, infrarenal abdominal aortic aneurysm.  He recently had a 2-day stay at [facility] for GI bleeding.  Was suspected to be diverticular in origin and required only observation.  He is here for for hospital follow-up.  He has not seen any further bleeding.  He tells me his hemoglobin at his PCPs office last week was 12 g.  He says that sometimes his bowel habits tend to alternate between some constipation and then loose stools, but otherwise no complaints today. ? ?Colonoscopy 06/2017: ?  ?- Six 3 to 5 mm polyps in the rectum, in the descending colon, in the transverse colon and in ?the ascending colon, removed with a cold snare. Resected and retrieved. ?- Diverticulosis in the transverse colon and in the left colon. ?- Internal hemorrhoids. ?- The examination was otherwise normal on direct and retroflexion views. ?  ?1. Surgical [P], ascending, polyps (2) ?- HYPERPLASTIC POLYP(S). ?- THERE IS NO EVIDENCE OF MALIGNANCY. ?2. Surgical [P], transverse, polyp ?- TUBULAR ADENOMA(S). ?- HIGH GRADE DYSPLASIA IS NOT IDENTIFIED. ?3. Surgical [P], descending, polyp ?- TUBULAR ADENOMA(S). ?- HIGH GRADE DYSPLASIA IS NOT IDENTIFIED. ?4. Surgical [P], rectum, polyps (2) ?- HYPERPLASTIC POLYP(S). ?- THERE IS NO EVIDENCE OF MALIGNANCY. ?  ?Repeat recommended in 5 years. ? ?Past Medical History:  ?Diagnosis Date  ? AAA (abdominal aortic aneurysm) ([place])   ? infrarenal aaa 3.1 x 2.8 per abd/pelvis ct 02-26-20 epic  ? Allergy   ? pollen  ? Anginal pain ([place]) 2005  ? Arthritis   ? hips, back, neck   ? Asthma   ? Atrial fibrillation ([place])   ? no documentation in epic/  pt states has never been told has A Fib  ? CAD (coronary artery disease)   ? s/p angioplasty and stenting x3 1994, pt states has this done  ? Cataract   ? small, mild  ? Chronic kidney disease   ? kidney cyst  ? Complication of anesthesia   ? woke up during gall bladder sx yrs ago and with colonscopy  ? Complication of anesthesia   ? pt thinks stopped breathing after gallbladder sx years ago, no record found  ? COPD (chronic obstructive pulmonary disease) ([place])   ? Dyspnea   ? Dysrhythmia   ? regular irregular heart beat  ? GERD (gastroesophageal reflux disease)   ? hx of  ? History of hiatal hernia   ? History of kidney stones   ? History of prediabetes   ? none in last 8 months since 40 lb weight loss  ? Hyperlipidemia   ? Hypertension   ? Lower extremity edema since 02-14-2020  ? swelling goes down when sleeps resolved 06-29-20  ? Neuromuscular disorder ([place])   ? nerve issues with back   ? Peripheral vascular disease ([place])   ? PVC's (premature ventricular contractions)   ? pt denies  ? Sleep apnea   ? wears cpap auto set 5 to 20  ? Tuberculosis 1966  ? ?Past Surgical History:  ?Procedure Laterality Date  ? APPENDECTOMY    ? CHOLECYSTECTOMY  1990s  ? Gall Bladder  ? COLONOSCOPY    ?  CORONARY ANGIOPLASTY WITH STENT PLACEMENT  1994  ? CYSTOSCOPY WITH INSERTION OF UROLIFT N/A 04/14/2020  ? Procedure: CYSTOSCOPY WITH INSERTION OF UROLIFT;  Surgeon: Festus Aloe, MD;  Location: [facility];  Service: Urology;  Laterality: N/A;  ? CYSTOSCOPY WITH STENT PLACEMENT Right 07/07/2020  ? Procedure: CYSTOSCOPY WITH STENT CHANGE;  Surgeon: Festus Aloe, MD;  [facility];  Service: Urology;  Laterality: Right;  ? CYSTOSCOPY WITH URETEROSCOPY AND STENT PLACEMENT Bilateral 01/03/2020  ? Procedure: CYSTOSCOPY WITH RETROGRADE/ URETEROSCOPY AND RIGHT STENT PLACEMENT BLADDER BIOPSY;  Surgeon: Festus Aloe, MD;  Location: [facility];  Service: Urology;  Laterality:  Bilateral;  ONLY NEEDS 30 MIN  ? CYSTOSCOPY WITH URETEROSCOPY AND STENT PLACEMENT Right 04/14/2020  ? Procedure: CYSTOSCOPY WITH URETEROSCOPY, RETROGRADE  AND STENT REPLACEMENT;  Surgeon: Festus Aloe, MD;  Location: [facility];  Service: Urology;  Laterality: Right;  ? EYE SURGERY Bilateral 2019  ? cataracts  ? lumbar disectomy  2005  ? L3 - 4 - 5  ? POLYPECTOMY    ? PR VEIN BYPASS GRAFT,AORTO-FEM-POP  08/31/11  ? Right to Left Fem-Fem  ? RENAL ANGIOGRAPHY N/A 02/14/2020  ? Procedure: RENAL ANGIOGRAPHY;  Surgeon: Elam Dutch, MD;  Location: [facility];  Service: Cardiovascular;  Laterality: N/A;  Bilateral  ? ROBOT ASSISTED PYELOPLASTY Right 11/20/2020  ? Procedure: XI ROBOTIC RIGHT ASSISTED PYELOPLASTY AND STENT PLACEMENT;  Surgeon: Ardis Hughs, MD;  Location: [facility];  Service: Urology;  Laterality: Right;  ? TONSILLECTOMY  as child  ? TONSILLECTOMY    ? TOTAL HIP ARTHROPLASTY Right 07/15/2020  ? Procedure: TOTAL HIP ARTHROPLASTY ANTERIOR APPROACH;  Surgeon: Gaynelle Arabian, MD;  Location: [facility];  Service: Orthopedics;  Laterality: Right;  177mn  ? UMBILICAL HERNIA REPAIR    ? x 2  ? ? reports that he has been smoking cigarettes. He has a 51.00 pack-year smoking history. He has never been exposed to tobacco smoke. He has never used smokeless tobacco. He reports that he does not currently use alcohol. He reports that he does not use drugs. ?family history includes Breast cancer in his sister; Cancer in his mother and sister; Diabetes in his father; Hyperlipidemia in his brother, mother, and sister; Hypertension in his brother, father, mother, and sister; Kidney cancer in his mother; Other in his father; Prostate cancer in his brother. ?Allergies  ?Allergen Reactions  ? Penicillins   ?  Childhood reaction ? ?REACTION: rapid pulse ?Did it involve swelling of the face/tongue/throat, SOB, or low BP? Yes ?Did it involve sudden or severe rash/hives, skin peeling, or any reaction on  the inside of your mouth or nose? Unknown ?Did you need to seek medical attention at a hospital or doctor's office? yes ?When did it last happen?      childhood  ?If all above answers are "NO", may proceed with cephalosporin use. ?  ? Fish Oil   ?  Other reaction(s): Heart attack symptoms  ? Other   ?  Oily seafood salmon-- chest pain  ? Penicillin G   ?  Other reaction(s): Unknown  ? Mold Extract [Trichophyton] Rash  ? ? ?  ?Outpatient Encounter Medications as of 03/29/2022  ?Medication Sig  ? albuterol (PROVENTIL) (2.5 MG/3ML) 0.083% nebulizer solution USE 1 VIAL VIA NEBULIZER EVERY 6 HOURS AS NEEDED FOR WHEEZING OR SHORTNESS OF BREATH (Patient taking differently: Take 2.5 mg by nebulization every 6 (six) hours as needed for wheezing or shortness  of breath.)  ? albuterol (VENTOLIN HFA) 108 (90 Base) MCG/ACT inhaler Inhale 1 puff into the lungs every 6 (six) hours as needed for wheezing or shortness of breath.  ? antiseptic oral rinse (BIOTENE) LIQD 15 mLs by Mouth Rinse route daily as needed for dry mouth.   ? aspirin 325 MG tablet Take 1 tablet (325 mg total) by mouth daily. Restart from 03/15/2022  ? atorvastatin (LIPITOR) 10 MG tablet Take 10 mg by mouth at bedtime.  ? Azelastine-Fluticasone 137-50 MCG/ACT SUSP PLACE 1-2 SPRAYS INTO EACH NOSTRIL AT BEDTIME (Patient taking differently: Place 1 spray into both nostrils at bedtime.)  ? diltiazem (DILACOR XR) 120 MG 24 hr capsule Take 1 capsule (120 mg total) by mouth daily. Take 1 capsule (120 mg total) by mouth daily. Schedule office visit for future refills (Patient taking differently: Take 120 mg by mouth daily.)  ? docusate sodium (COLACE) 100 MG capsule Take 100 mg by mouth daily.  ? esomeprazole (NEXIUM) 40 MG capsule Take 40 mg by mouth daily as needed (acid reflux/indigestion.).   ? finasteride (PROSCAR) 5 MG tablet Take 5 mg by mouth at bedtime.   ? Fluticasone-Umeclidin-Vilant (TRELEGY ELLIPTA) 200-62.5-25 MCG/ACT AEPB Inhale 1 puff into the lungs daily.   ? irbesartan (AVAPRO) 150 MG tablet Take 150 mg by mouth every evening.   ? isosorbide mononitrate (IMDUR) 60 MG 24 hr tablet Take 60 mg by mouth every evening.  ? magnesium oxide (MAG-OX) 400 MG tablet

## 2022-03-30 DIAGNOSIS — I5032 Chronic diastolic (congestive) heart failure: Secondary | ICD-10-CM | POA: Diagnosis not present

## 2022-03-30 DIAGNOSIS — N1832 Chronic kidney disease, stage 3b: Secondary | ICD-10-CM | POA: Diagnosis not present

## 2022-03-30 DIAGNOSIS — K5791 Diverticulosis of intestine, part unspecified, without perforation or abscess with bleeding: Secondary | ICD-10-CM | POA: Diagnosis not present

## 2022-03-30 DIAGNOSIS — N189 Chronic kidney disease, unspecified: Secondary | ICD-10-CM | POA: Diagnosis not present

## 2022-03-30 DIAGNOSIS — I130 Hypertensive heart and chronic kidney disease with heart failure and stage 1 through stage 4 chronic kidney disease, or unspecified chronic kidney disease: Secondary | ICD-10-CM | POA: Diagnosis not present

## 2022-03-30 DIAGNOSIS — D631 Anemia in chronic kidney disease: Secondary | ICD-10-CM | POA: Diagnosis not present

## 2022-03-30 DIAGNOSIS — E1159 Type 2 diabetes mellitus with other circulatory complications: Secondary | ICD-10-CM | POA: Diagnosis not present

## 2022-03-30 DIAGNOSIS — I2510 Atherosclerotic heart disease of native coronary artery without angina pectoris: Secondary | ICD-10-CM | POA: Diagnosis not present

## 2022-03-30 DIAGNOSIS — D500 Iron deficiency anemia secondary to blood loss (chronic): Secondary | ICD-10-CM | POA: Diagnosis not present

## 2022-03-30 DIAGNOSIS — N1831 Chronic kidney disease, stage 3a: Secondary | ICD-10-CM | POA: Diagnosis not present

## 2022-03-30 DIAGNOSIS — E1122 Type 2 diabetes mellitus with diabetic chronic kidney disease: Secondary | ICD-10-CM | POA: Diagnosis not present

## 2022-03-30 NOTE — Telephone Encounter (Signed)
Spoke with patient and he agreed to use the GoodRx coupon. He will go by [facility] to pick up Suprep.  ?

## 2022-04-12 ENCOUNTER — Ambulatory Visit: Payer: Medicare PPO | Admitting: Podiatry

## 2022-04-12 ENCOUNTER — Encounter: Payer: Self-pay | Admitting: Podiatry

## 2022-04-12 DIAGNOSIS — M79675 Pain in left toe(s): Secondary | ICD-10-CM

## 2022-04-12 DIAGNOSIS — L84 Corns and callosities: Secondary | ICD-10-CM

## 2022-04-12 DIAGNOSIS — M79674 Pain in right toe(s): Secondary | ICD-10-CM

## 2022-04-12 DIAGNOSIS — I739 Peripheral vascular disease, unspecified: Secondary | ICD-10-CM

## 2022-04-12 DIAGNOSIS — B351 Tinea unguium: Secondary | ICD-10-CM

## 2022-04-12 DIAGNOSIS — M79676 Pain in unspecified toe(s): Secondary | ICD-10-CM

## 2022-04-19 DIAGNOSIS — N281 Cyst of kidney, acquired: Secondary | ICD-10-CM | POA: Diagnosis not present

## 2022-04-19 DIAGNOSIS — N130 Hydronephrosis with ureteropelvic junction obstruction: Secondary | ICD-10-CM | POA: Diagnosis not present

## 2022-04-19 DIAGNOSIS — N200 Calculus of kidney: Secondary | ICD-10-CM | POA: Diagnosis not present

## 2022-04-19 NOTE — Progress Notes (Signed)
?Subjective:  ?Patient ID: Dean Filter., male    DOB: 11-Sep-1946,  MRN: [id number] ? ?Dean [name] [employer]. presents to clinic today for for at risk foot care. Patient has h/o PAD and callus(es) left lower extremity and painful thick toenails that are difficult to trim. Painful toenails interfere with ambulation. Aggravating factors include wearing enclosed shoe gear. Pain is relieved with periodic professional debridement. Painful calluses are aggravated when weightbearing with and without shoegear. Pain is relieved with periodic professional debridement. ? ?New problem(s): None.  ? ?He is followed by Vascular Surgery and last visit was 03/24/2022. ? ?PCP is [name], MD , and last visit was March 21, 2022. ? ?Allergies  ?Allergen Reactions  ? Penicillins   ?  Childhood reaction ? ?REACTION: rapid pulse ?Did it involve swelling of the face/tongue/throat, SOB, or low BP? Yes ?Did it involve sudden or severe rash/hives, skin peeling, or any reaction on the inside of your mouth or nose? Unknown ?Did you need to seek medical attention at a hospital or doctor's office? yes ?When did it last happen?      childhood  ?If all above answers are "NO", may proceed with cephalosporin use. ?  ? Fish Oil   ?  Other reaction(s): Heart attack symptoms  ? Other   ?  Oily seafood salmon-- chest pain  ? Penicillin G   ?  Other reaction(s): Unknown  ? Mold Extract [Trichophyton] Rash  ? ? ?Review of Systems: Negative except as noted in the HPI. ? ?Objective: No changes noted in today's physical examination. ? ?General: Dean Filter. is a pleasant 76 y.o. Caucasian male, WD, WN in NAD. AAO x 3.  ? ?Vascular:  ?CFT <4 seconds b/l LE. Faintly palpable DP pulses b/l LE. Diminished PT pulse(s) b/l LE. Lower extremity skin temperature gradient within normal limits. No ischemia or gangrene noted b/l LE. No cyanosis or clubbing noted b/l LE. ? ?Dermatological:  ?Pedal skin thin and atrophic b/l LE. No open wounds b/l  lower extremities. No interdigital macerations b/l lower extremities. Toenails 2-5 bilaterally and R hallux elongated, discolored, dystrophic, thickened, and crumbly with subungual debris and tenderness to dorsal palpation. Anonychia noted L hallux. Nailbed(s) epithelialized.  Hyperkeratotic lesion(s) L hallux and submet head 1 left foot.  No erythema, no edema, no drainage, no fluctuance. ? ?Musculoskeletal:  ?Normal muscle strength 5/5 to all lower extremity muscle groups bilaterally. No pain crepitus or joint limitation noted with ROM b/l. Hallux valgus with bunion deformity noted b/l lower extremities. ? ?Neurological:  ?Protective sensation intact 5/5 intact bilaterally with 10g monofilament b/l. Vibratory sensation intact b/l. ? ?LOWER EXTREMITY DOPPLER STUDY  ? ?Patient Name:  Dean [name].  Date of Exam:   03/24/2022  ?Medical Rec #: [id number]              Accession #:    [id number]  ?Date of Birth: [birth year]              Patient Gender: M  ?Patient Age:   4 years  ?Exam Location:  [facility]  ?Procedure:      VAS [place] ABI WITH/WO TBI  ?Referring Phys: Harrell Gave DICKSON  ? ? ?---------------------------------------------------------------------------  ?-----  ?   ?Indications: Peripheral artery disease.  ? ?High Risk Factors: Hypertension, hyperlipidemia, current smoker.  ? ?Other Factors: 08/31/2011 right to left femoral to femoral bypass graft.  ? Performing Technologist: Alvia Grove RVT  ? ?   ?Examination Guidelines: A  complete evaluation includes at minimum, Doppler  ?waveform signals and systolic blood pressure reading at the level of  ?bilateral  ?brachial, anterior tibial, and posterior tibial arteries, when vessel  ?segments  ?are accessible. Bilateral testing is considered an integral part of a  ?complete  ?examination. Photoelectric Plethysmograph (PPG) waveforms and toe systolic  ?pressure readings are included as required and additional duplex testing  ?as  ?needed.  Limited examinations for reoccurring indications may be performed  ?as  ?noted.  ? ?   ?ABI Findings:  ?+---------+------------------+-----+----------+--------+  ?Right    Rt Pressure (mmHg)IndexWaveform  Comment   ?+---------+------------------+-----+----------+--------+  ?Brachial 143                                        ?+---------+------------------+-----+----------+--------+  ?PTA      118               0.83 monophasic          ?+---------+------------------+-----+----------+--------+  ?DP       60                0.42 monophasic          ?+---------+------------------+-----+----------+--------+  ?Great Toe43                0.30 Abnormal            ?+---------+------------------+-----+----------+--------+  ? ?+---------+------------------+-----+----------+-------+  ?Left     Lt Pressure (mmHg)IndexWaveform  Comment  ?+---------+------------------+-----+----------+-------+  ?Brachial 131                                       ?+---------+------------------+-----+----------+-------+  ?PTA      106               0.74 monophasic         ?+---------+------------------+-----+----------+-------+  ?DP       117               0.82 biphasic           ?+---------+------------------+-----+----------+-------+  ?Great Toe67                0.47 Abnormal           ?+---------+------------------+-----+----------+-------+  ? ?+-------+-----------+-----------+------------+------------+  ?ABI/TBIToday's ABIToday's TBIPrevious ABIPrevious TBI  ?+-------+-----------+-----------+------------+------------+  ?Right  0.83       0.30       0.74        0.41          ?+-------+-----------+-----------+------------+------------+  ?Left   0.82       0.47       0.66        0.41          ?+-------+-----------+-----------+------------+------------+  ?Bilateral ABIs appear increased, may be falsely elevated.  ?   ?Summary:  ?Right: Although resting right ankle-brachial  index appears to be mildly  ?reduced, may be falsely elevated due to calcified arteries. The right  ?toe-brachial index is abnormal.  ? ?Left: Although resting left ankle-brachial index appears mildly reduced,  ?may be falsely elevated due to calcified arteries. . The left toe-brachial  ?index is abnormal.  ? ? ?*See table(s) above for measurements and observations.  ?   ? ? ?Electronically signed by Deitra Mayo MD on 03/24/2022 at 11:40:54  ?AM.  ?   ? ? ?  Final   ? ?  Assessment/Plan: ?1. Pain due to onychomycosis of toenail   ?2. Callus   ?3. PAD (peripheral artery disease) ([place])   ?-Examined patient. ?-Patient to continue soft, supportive shoe gear daily. ?-Toenails 2-5 bilaterally and right great toe debrided in length and girth without iatrogenic bleeding with sterile nail nipper and dremel.  ?-Callus(es) left great toe and 1st metatarsal head left lower extremity pared utilizing sterile scalpel blade without complication or incident. Total number debrided =2. ?-Patient/POA to call should there be question/concern in the interim.  ? ?Return in about 3 months (around 07/12/2022). ? ?[name], DPM  ?

## 2022-04-20 ENCOUNTER — Encounter: Payer: Self-pay | Admitting: Internal Medicine

## 2022-04-25 DIAGNOSIS — N1330 Unspecified hydronephrosis: Secondary | ICD-10-CM | POA: Diagnosis not present

## 2022-04-25 DIAGNOSIS — I700 Atherosclerosis of aorta: Secondary | ICD-10-CM | POA: Diagnosis not present

## 2022-04-25 DIAGNOSIS — N281 Cyst of kidney, acquired: Secondary | ICD-10-CM | POA: Diagnosis not present

## 2022-04-26 ENCOUNTER — Telehealth: Payer: Self-pay | Admitting: Internal Medicine

## 2022-04-26 NOTE — Telephone Encounter (Signed)
We received a call from patient requesting to speak with  nurse. Procedure scheduled 04/27/22. Per patient, had a CT scan done yesterday with dye. Patient wants to know if the dye will interfere with procedure? Patient also states having diarrhea, has not taken any medications, what do we advise?  ?

## 2022-04-26 NOTE — Telephone Encounter (Signed)
Returned patient call.  Per Dr. Henrene Pastor take either a bottle of mag-citrate before starting prep this evening.  If unable to purchase mag-citrate get 119 g bottle of miralax and mix with not red or purple gatorade instead.  Patient verbalized understanding and states he will call around to see if he can find mag-citrate. ?

## 2022-04-27 ENCOUNTER — Ambulatory Visit (AMBULATORY_SURGERY_CENTER): Payer: Medicare PPO | Admitting: Internal Medicine

## 2022-04-27 ENCOUNTER — Encounter: Payer: Self-pay | Admitting: Internal Medicine

## 2022-04-27 VITALS — BP 156/74 | HR 47 | Temp 96.8°F | Resp 21 | Ht 63.0 in | Wt 171.0 lb

## 2022-04-27 DIAGNOSIS — D124 Benign neoplasm of descending colon: Secondary | ICD-10-CM | POA: Diagnosis not present

## 2022-04-27 DIAGNOSIS — Z86010 Personal history of colonic polyps: Secondary | ICD-10-CM

## 2022-04-27 DIAGNOSIS — D120 Benign neoplasm of cecum: Secondary | ICD-10-CM

## 2022-04-27 HISTORY — PX: COLONOSCOPY WITH PROPOFOL: SHX5780

## 2022-04-27 MED ORDER — SODIUM CHLORIDE 0.9 % IV SOLN: 500.0000 mL | Freq: Once | INTRAVENOUS | Status: DC

## 2022-04-27 NOTE — Patient Instructions (Signed)
2 polyps removed- await pathology ? ?Please read over handouts about polyps and diverticulosis ? ?Continue your normal medications ? ? ?YOU HAD AN ENDOSCOPIC PROCEDURE TODAY AT [facility]:   Refer to the procedure report that was given to you for any specific questions about what was found during the examination.  If the procedure report does not answer your questions, please call your gastroenterologist to clarify.  If you requested that your care partner not be given the details of your procedure findings, then the procedure report has been included in a sealed envelope for you to review at your convenience later. ? ?YOU SHOULD EXPECT: Some feelings of bloating in the abdomen. Passage of more gas than usual.  Walking can help get rid of the air that was put into your GI tract during the procedure and reduce the bloating. If you had a lower endoscopy (such as a colonoscopy or flexible sigmoidoscopy) you may notice spotting of blood in your stool or on the toilet paper. If you underwent a bowel prep for your procedure, you may not have a normal bowel movement for a few days. ? ?Please Note:  You might notice some irritation and congestion in your nose or some drainage.  This is from the oxygen used during your procedure.  There is no need for concern and it should clear up in a day or so. ? ?SYMPTOMS TO REPORT IMMEDIATELY: ? ?Following lower endoscopy (colonoscopy or flexible sigmoidoscopy): ? Excessive amounts of blood in the stool ? Significant tenderness or worsening of abdominal pains ? Swelling of the abdomen that is new, acute ? Fever of 100?F or higher ? ? ?For urgent or emergent issues, a gastroenterologist can be reached at any hour by calling [phone]. ?Do not use MyChart messaging for urgent concerns.  ? ? ?DIET:  We do recommend a small meal at first, but then you may proceed to your regular diet.  Drink plenty of fluids but you should avoid alcoholic beverages for 24  hours. ? ?ACTIVITY:  You should plan to take it easy for the rest of today and you should NOT DRIVE or use heavy machinery until tomorrow (because of the sedation medicines used during the test).   ? ?FOLLOW UP: ?Our staff will call the number listed on your records 48-72 hours following your procedure to check on you and address any questions or concerns that you may have regarding the information given to you following your procedure. If we do not reach you, we will leave a message.  We will attempt to reach you two times.  During this call, we will ask if you have developed any symptoms of COVID 19. If you develop any symptoms (ie: fever, flu-like symptoms, shortness of breath, cough etc.) before then, please call [phone].  If you test positive for Covid 19 in the 2 weeks post procedure, please call and report this information to [place].   ? ?If any biopsies were taken you will be contacted by phone or by letter within the next 1-3 weeks.  Please call us at [phone] if you have not heard about the biopsies in 3 weeks.  ? ? ?SIGNATURES/CONFIDENTIALITY: ?You and/or your care partner have signed paperwork which will be entered into your electronic medical record.  These signatures attest to the fact that that the information above on your After Visit Summary has been reviewed and is understood.  Full responsibility of the confidentiality of this discharge information lies with you and/or  your care-partner. ? ?

## 2022-04-27 NOTE — Progress Notes (Signed)
03/29/2022 ?[name]. ?[id number] ?05-16-1946 ?  ?  ?HISTORY OF PRESENT ILLNESS: This is a 76 year old male who is a patient of Dr. Blanch Media.  He has a history of coronary artery disease status post stenting only on ASA 325 mg daily, left lower extremity femoropopliteal bypass, chronic kidney disease stage III, chronic normocytic anemia, COPD, sleep apnea, infrarenal abdominal aortic aneurysm.  He recently had a 2-day stay at [facility] for GI bleeding.  Was suspected to be diverticular in origin and required only observation.  He is here for for hospital follow-up.  He has not seen any further bleeding.  He tells me his hemoglobin at his PCPs office last week was 12 g.  He says that sometimes his bowel habits tend to alternate between some constipation and then loose stools, but otherwise no complaints today. ?  ?Colonoscopy 06/2017: ?  ?- Six 3 to 5 mm polyps in the rectum, in the descending colon, in the transverse colon and in ?the ascending colon, removed with a cold snare. Resected and retrieved. ?- Diverticulosis in the transverse colon and in the left colon. ?- Internal hemorrhoids. ?- The examination was otherwise normal on direct and retroflexion views. ?  ?1. Surgical [P], ascending, polyps (2) ?- HYPERPLASTIC POLYP(S). ?- THERE IS NO EVIDENCE OF MALIGNANCY. ?2. Surgical [P], transverse, polyp ?- TUBULAR ADENOMA(S). ?- HIGH GRADE DYSPLASIA IS NOT IDENTIFIED. ?3. Surgical [P], descending, polyp ?- TUBULAR ADENOMA(S). ?- HIGH GRADE DYSPLASIA IS NOT IDENTIFIED. ?4. Surgical [P], rectum, polyps (2) ?- HYPERPLASTIC POLYP(S). ?- THERE IS NO EVIDENCE OF MALIGNANCY. ?  ?Repeat recommended in 5 years. ?  ?    ?Past Medical History:  ?Diagnosis Date  ? AAA (abdominal aortic aneurysm) ([place])    ?  infrarenal aaa 3.1 x 2.8 per abd/pelvis ct 02-26-20 epic  ? Allergy    ?  pollen  ? Anginal pain ([place]) 2005  ? Arthritis    ?  hips, back, neck   ? Asthma    ? Atrial fibrillation ([place])    ?  no  documentation in epic/ pt states has never been told has A Fib  ? CAD (coronary artery disease)    ?  s/p angioplasty and stenting x3 1994, pt states has this done  ? Cataract    ?  small, mild  ? Chronic kidney disease    ?  kidney cyst  ? Complication of anesthesia    ?  woke up during gall bladder sx yrs ago and with colonscopy  ? Complication of anesthesia    ?  pt thinks stopped breathing after gallbladder sx years ago, no record found  ? COPD (chronic obstructive pulmonary disease) ([place])    ? Dyspnea    ? Dysrhythmia    ?  regular irregular heart beat  ? GERD (gastroesophageal reflux disease)    ?  hx of  ? History of hiatal hernia    ? History of kidney stones    ? History of prediabetes    ?  none in last 8 months since 40 lb weight loss  ? Hyperlipidemia    ? Hypertension    ? Lower extremity edema since 02-14-2020  ?  swelling goes down when sleeps resolved 06-29-20  ? Neuromuscular disorder ([place])    ?  nerve issues with back   ? Peripheral vascular disease ([place])    ? PVC's (premature ventricular contractions)    ?  pt denies  ? Sleep apnea    ?  wears cpap auto set 5 to 20  ? Tuberculosis 1966  ?  ?     ?Past Surgical History:  ?Procedure Laterality Date  ? APPENDECTOMY      ? CHOLECYSTECTOMY   1990s  ?  Gall Bladder  ? COLONOSCOPY      ? CORONARY ANGIOPLASTY WITH STENT PLACEMENT   1994  ? CYSTOSCOPY WITH INSERTION OF UROLIFT N/A 04/14/2020  ?  Procedure: CYSTOSCOPY WITH INSERTION OF UROLIFT;  Surgeon: Festus Aloe, MD;  Location: [facility];  Service: Urology;  Laterality: N/A;  ? CYSTOSCOPY WITH STENT PLACEMENT Right 07/07/2020  ?  Procedure: CYSTOSCOPY WITH STENT CHANGE;  Surgeon: Festus Aloe, MD;  Location: [facility];  Service: Urology;  Laterality: Right;  ? CYSTOSCOPY WITH URETEROSCOPY AND STENT PLACEMENT Bilateral 01/03/2020  ?  Procedure: CYSTOSCOPY WITH RETROGRADE/ URETEROSCOPY AND RIGHT STENT PLACEMENT BLADDER BIOPSY;  Surgeon: Festus Aloe, MD;   [facility];  Service: Urology;  Laterality: Bilateral;  ONLY NEEDS 30 MIN  ? CYSTOSCOPY WITH URETEROSCOPY AND STENT PLACEMENT Right 04/14/2020  ?  Procedure: CYSTOSCOPY WITH URETEROSCOPY, RETROGRADE  AND STENT REPLACEMENT;  Surgeon: Festus Aloe, MD;  Location: [facility];  Service: Urology;  Laterality: Right;  ? EYE SURGERY Bilateral 2019  ?  cataracts  ? lumbar disectomy   2005  ?  L3 - 4 - 5  ? POLYPECTOMY      ? PR VEIN BYPASS GRAFT,AORTO-FEM-POP   08/31/11  ?  Right to Left Fem-Fem  ? RENAL ANGIOGRAPHY N/A 02/14/2020  ?  Procedure: RENAL ANGIOGRAPHY;  Surgeon: Elam Dutch, MD;  Location: [facility];  Service: Cardiovascular;  Laterality: N/A;  Bilateral  ? ROBOT ASSISTED PYELOPLASTY Right 11/20/2020  ?  Procedure: XI ROBOTIC RIGHT ASSISTED PYELOPLASTY AND STENT PLACEMENT;  Surgeon: Ardis Hughs, MD;  Location: [facility];  Service: Urology;  Laterality: Right;  ? TONSILLECTOMY   as child  ? TONSILLECTOMY      ? TOTAL HIP ARTHROPLASTY Right 07/15/2020  ?  Procedure: TOTAL HIP ARTHROPLASTY ANTERIOR APPROACH;  Surgeon: Gaynelle Arabian, MD;  Location: [facility];  Service: Orthopedics;  Laterality: Right;  131mn  ? UMBILICAL HERNIA REPAIR      ?  x 2  ?  ? reports that he has been smoking cigarettes. He has a 51.00 pack-year smoking history. He has never been exposed to tobacco smoke. He has never used smokeless tobacco. He reports that he does not currently use alcohol. He reports that he does not use drugs. ?family history includes Breast cancer in his sister; Cancer in his mother and sister; Diabetes in his father; Hyperlipidemia in his brother, mother, and sister; Hypertension in his brother, father, mother, and sister; Kidney cancer in his mother; Other in his father; Prostate cancer in his brother. ?     ?Allergies  ?Allergen Reactions  ? Penicillins    ?    Childhood reaction ?  ?REACTION: rapid pulse ?Did it involve swelling of the face/tongue/throat,  SOB, or low BP? Yes ?Did it involve sudden or severe rash/hives, skin peeling, or any reaction on the inside of your mouth or nose? Unknown ?Did you need to seek medical attention at a hospital or doctor's office? yes ?When did it last happen?      childhood  ?If all above answers are "NO", may proceed with cephalosporin use. ?   ? Fish Oil    ?    Other reaction(s): Heart  attack symptoms  ? Other    ?    Oily seafood salmon-- chest pain  ? Penicillin G    ?    Other reaction(s): Unknown  ? Mold Extract [Trichophyton] Rash  ?  ?  ?  ?    ?Outpatient Encounter Medications as of 03/29/2022  ?Medication Sig  ? albuterol (PROVENTIL) (2.5 MG/3ML) 0.083% nebulizer solution USE 1 VIAL VIA NEBULIZER EVERY 6 HOURS AS NEEDED FOR WHEEZING OR SHORTNESS OF BREATH (Patient taking differently: Take 2.5 mg by nebulization every 6 (six) hours as needed for wheezing or shortness of breath.)  ? albuterol (VENTOLIN HFA) 108 (90 Base) MCG/ACT inhaler Inhale 1 puff into the lungs every 6 (six) hours as needed for wheezing or shortness of breath.  ? antiseptic oral rinse (BIOTENE) LIQD 15 mLs by Mouth Rinse route daily as needed for dry mouth.   ? aspirin 325 MG tablet Take 1 tablet (325 mg total) by mouth daily. Restart from 03/15/2022  ? atorvastatin (LIPITOR) 10 MG tablet Take 10 mg by mouth at bedtime.  ? Azelastine-Fluticasone 137-50 MCG/ACT SUSP PLACE 1-2 SPRAYS INTO EACH NOSTRIL AT BEDTIME (Patient taking differently: Place 1 spray into both nostrils at bedtime.)  ? diltiazem (DILACOR XR) 120 MG 24 hr capsule Take 1 capsule (120 mg total) by mouth daily. Take 1 capsule (120 mg total) by mouth daily. Schedule office visit for future refills (Patient taking differently: Take 120 mg by mouth daily.)  ? docusate sodium (COLACE) 100 MG capsule Take 100 mg by mouth daily.  ? esomeprazole (NEXIUM) 40 MG capsule Take 40 mg by mouth daily as needed (acid reflux/indigestion.).   ? finasteride (PROSCAR) 5 MG tablet Take 5 mg by mouth at  bedtime.   ? Fluticasone-Umeclidin-Vilant (TRELEGY ELLIPTA) 200-62.5-25 MCG/ACT AEPB Inhale 1 puff into the lungs daily.  ? irbesartan (AVAPRO) 150 MG tablet Take 150 mg by mouth every evening.   ? isosorbide mononitrate (I

## 2022-04-27 NOTE — Op Note (Signed)
[facility] ?Patient Name: Dean [name] ?Procedure Date: 04/27/2022 2:24 PM ?MRN: [id number] ?Endoscopist: Docia Chuck. Henrene Pastor , MD ?Age: 76 ?Referring MD:  ?Date of Birth: [birth year] ?Gender: Male ?Account #: [id number] ?Procedure:                Colonoscopy with cold snare polypectomy x 2 ?Indications:              High risk colon cancer surveillance: Personal  ?                          history of multiple (3 or more) adenomas. Previous  ?                          examinations 2003, 2006, 2013, 2018 ?Medicines:                Monitored Anesthesia Care ?Procedure:                Pre-Anesthesia Assessment: ?                          - Prior to the procedure, a History and Physical  ?                          was performed, and patient medications and  ?                          allergies were reviewed. The patient's tolerance of  ?                          previous anesthesia was also reviewed. The risks  ?                          and benefits of the procedure and the sedation  ?                          options and risks were discussed with the patient.  ?                          All questions were answered, and informed consent  ?                          was obtained. Prior Anticoagulants: The patient has  ?                          taken no previous anticoagulant or antiplatelet  ?                          agents. ASA Grade Assessment: II - A patient with  ?                          mild systemic disease. After reviewing the risks  ?                          and benefits, the patient was deemed in  ?  satisfactory condition to undergo the procedure. ?                          After obtaining informed consent, the colonoscope  ?                          was passed under direct vision. Throughout the  ?                          procedure, the patient's blood pressure, pulse, and  ?                          oxygen saturations were monitored continuously. The  ?                           Colonoscope was introduced through the anus and  ?                          advanced to the the cecum, identified by  ?                          appendiceal orifice and ileocecal valve. The  ?                          ileocecal valve, appendiceal orifice, and rectum  ?                          were photographed. The quality of the bowel  ?                          preparation was excellent. The colonoscopy was  ?                          performed without difficulty. The patient tolerated  ?                          the procedure well. The bowel preparation used was  ?                          SUPREP via split dose instruction. ?Scope In: 2:50:44 PM ?Scope Out: 3:07:13 PM ?Scope Withdrawal Time: 0 hours 11 minutes 28 seconds  ?Total Procedure Duration: 0 hours 16 minutes 29 seconds  ?Findings:                 Two polyps were found in the descending colon and  ?                          ileocecal valve. The polyps were 2 to 4 mm in size.  ?                          These polyps were removed with a cold snare.  ?                          Resection and retrieval were complete. ?  Multiple small and large-mouthed diverticula were  ?                          found in the left colon. ?                          The exam was otherwise without abnormality on  ?                          direct and retroflexion views. ?Complications:            No immediate complications. Estimated blood loss:  ?                          None. ?Estimated Blood Loss:     Estimated blood loss: none. ?Impression:               - Two 2 to 4 mm polyps in the descending colon and  ?                          at the ileocecal valve, removed with a cold snare.  ?                          Resected and retrieved. ?                          - Diverticulosis in the left colon. ?                          - The examination was otherwise normal on direct  ?                          and retroflexion views. ?Recommendation:            - Repeat colonoscopy is not recommended for  ?                          surveillance. ?                          - Patient has a contact number available for  ?                          emergencies. The signs and symptoms of potential  ?                          delayed complications were discussed with the  ?                          patient. Return to normal activities tomorrow.  ?                          Written discharge instructions were provided to the  ?                          patient. ?                          -  Resume previous diet. ?                          - Continue present medications. ?                          - Await pathology results. ?Docia Chuck. Henrene Pastor, MD ?04/27/2022 3:15:06 PM ?This report has been signed electronically. ?

## 2022-04-27 NOTE — Progress Notes (Signed)
Report to pacu rn; vss. ?

## 2022-04-27 NOTE — Progress Notes (Signed)
Pt's states no medical or surgical changes since previsit or office visit. 

## 2022-04-29 ENCOUNTER — Telehealth: Payer: Self-pay

## 2022-04-29 NOTE — Telephone Encounter (Signed)
?  Follow up Call- ? ? ?  04/27/2022  ?  1:32 PM  ?Call back number  ?Post procedure Call Back phone  # [phone]  ?Permission to leave phone message Yes  ?  ? ?Patient questions: ? ?Do you have a fever, pain , or abdominal swelling? No. ?Pain Score  0 * ? ?Have you tolerated food without any problems? Yes.   ? ?Have you been able to return to your normal activities? Yes.   ? ?Do you have any questions about your discharge instructions: ?Diet   No. ?Medications  No. ?Follow up visit  No. ? ?Do you have questions or concerns about your Care? No. ? ?Actions: ?* If pain score is 4 or above: ?No action needed, pain <4. ? ? ?

## 2022-05-02 ENCOUNTER — Encounter: Payer: Self-pay | Admitting: Internal Medicine

## 2022-05-13 ENCOUNTER — Telehealth: Payer: Self-pay | Admitting: Internal Medicine

## 2022-05-13 NOTE — Telephone Encounter (Signed)
Called and spoke to patient . Sree,PT with [facility] also at patient home.  Physical therapist called office to informed  office  that patient heart rate is 52 prior to exercising.  Sree states patient heart rate is in the 60's while walking .  Now at rest is ranging 52-53. Patient hs no symptoms.  Continue to monitor.  Heart rate at last office appointment was 41 which was 08/14/20.  Patient is taking Diltiazem 120 mg daily. RN reviewed patient chart Patient last visit was 2021 with Dr Debara Pickett.  RN  schedule an appointment with next available opening with an extender 05/1922 and next available with Dr Debara Pickett 11/03/22.

## 2022-05-13 NOTE — Telephone Encounter (Signed)
STAT if HR is under 50 or over 120 (normal HR is 60-100 beats per minute)  What is your heart rate? 52  Do you have a log of your heart rate readings (document readings)? No   Do you have any other symptoms? No   Patient ws with home health PT who called to report readings

## 2022-05-23 DIAGNOSIS — E1159 Type 2 diabetes mellitus with other circulatory complications: Secondary | ICD-10-CM | POA: Diagnosis not present

## 2022-05-23 DIAGNOSIS — K219 Gastro-esophageal reflux disease without esophagitis: Secondary | ICD-10-CM | POA: Diagnosis not present

## 2022-05-23 DIAGNOSIS — I700 Atherosclerosis of aorta: Secondary | ICD-10-CM | POA: Diagnosis not present

## 2022-05-23 DIAGNOSIS — E785 Hyperlipidemia, unspecified: Secondary | ICD-10-CM | POA: Diagnosis not present

## 2022-05-23 DIAGNOSIS — N1831 Chronic kidney disease, stage 3a: Secondary | ICD-10-CM | POA: Diagnosis not present

## 2022-05-23 DIAGNOSIS — I872 Venous insufficiency (chronic) (peripheral): Secondary | ICD-10-CM | POA: Diagnosis not present

## 2022-05-23 DIAGNOSIS — F17200 Nicotine dependence, unspecified, uncomplicated: Secondary | ICD-10-CM | POA: Diagnosis not present

## 2022-05-23 DIAGNOSIS — J449 Chronic obstructive pulmonary disease, unspecified: Secondary | ICD-10-CM | POA: Diagnosis not present

## 2022-05-24 DIAGNOSIS — R3912 Poor urinary stream: Secondary | ICD-10-CM | POA: Diagnosis not present

## 2022-05-24 DIAGNOSIS — N130 Hydronephrosis with ureteropelvic junction obstruction: Secondary | ICD-10-CM | POA: Diagnosis not present

## 2022-05-24 DIAGNOSIS — N401 Enlarged prostate with lower urinary tract symptoms: Secondary | ICD-10-CM | POA: Diagnosis not present

## 2022-06-06 ENCOUNTER — Ambulatory Visit: Payer: Medicare PPO | Admitting: Physician Assistant

## 2022-06-08 ENCOUNTER — Encounter: Payer: Self-pay | Admitting: Nurse Practitioner

## 2022-06-08 ENCOUNTER — Ambulatory Visit: Payer: Medicare PPO | Admitting: Nurse Practitioner

## 2022-06-08 VITALS — BP 154/72 | HR 71 | Ht 63.5 in | Wt 170.0 lb

## 2022-06-08 DIAGNOSIS — Z720 Tobacco use: Secondary | ICD-10-CM

## 2022-06-08 DIAGNOSIS — R001 Bradycardia, unspecified: Secondary | ICD-10-CM

## 2022-06-08 DIAGNOSIS — I10 Essential (primary) hypertension: Secondary | ICD-10-CM

## 2022-06-08 DIAGNOSIS — G4733 Obstructive sleep apnea (adult) (pediatric): Secondary | ICD-10-CM | POA: Diagnosis not present

## 2022-06-08 DIAGNOSIS — Z8719 Personal history of other diseases of the digestive system: Secondary | ICD-10-CM

## 2022-06-08 DIAGNOSIS — I739 Peripheral vascular disease, unspecified: Secondary | ICD-10-CM

## 2022-06-08 DIAGNOSIS — M7989 Other specified soft tissue disorders: Secondary | ICD-10-CM

## 2022-06-08 DIAGNOSIS — N1831 Chronic kidney disease, stage 3a: Secondary | ICD-10-CM

## 2022-06-08 DIAGNOSIS — J449 Chronic obstructive pulmonary disease, unspecified: Secondary | ICD-10-CM | POA: Diagnosis not present

## 2022-06-08 DIAGNOSIS — E785 Hyperlipidemia, unspecified: Secondary | ICD-10-CM

## 2022-06-08 DIAGNOSIS — I2510 Atherosclerotic heart disease of native coronary artery without angina pectoris: Secondary | ICD-10-CM | POA: Diagnosis not present

## 2022-06-08 DIAGNOSIS — I493 Ventricular premature depolarization: Secondary | ICD-10-CM

## 2022-06-08 DIAGNOSIS — I714 Abdominal aortic aneurysm, without rupture, unspecified: Secondary | ICD-10-CM

## 2022-06-08 MED ORDER — POTASSIUM CHLORIDE CRYS ER 10 MEQ PO TBCR: EXTENDED_RELEASE_TABLET | ORAL | 3 refills | Status: DC

## 2022-06-08 MED ORDER — FUROSEMIDE 20 MG PO TABS: 20.0000 mg | ORAL_TABLET | Freq: Every day | ORAL | 3 refills | Status: DC | PRN

## 2022-06-08 NOTE — Patient Instructions (Signed)
Medication Instructions:  Lasix 20 mg daily as needed only for weight gain of 3 lb over night or 5 lb weight gain in 1 week Potassium 10 meq take one tablet on the days Lasix is taken.  *If you need a refill on your cardiac medications before your next appointment, please call your pharmacy*   Lab Work: NONE ordered at this time of appointment   If you have labs (blood work) drawn today and your tests are completely normal, you will receive your results only by: [place] (if you have MyChart) OR A paper copy in the mail If you have any lab test that is abnormal or we need to change your treatment, we will call you to review the results.   Testing/Procedures: NONE ordered at this time of appointment     Follow-Up: At [facility], you and your health needs are our priority.  As part of our continuing mission to provide you with exceptional heart care, we have created designated Provider Care Teams.  These Care Teams include your primary Cardiologist (physician) and Advanced Practice Providers (APPs -  Physician Assistants and Nurse Practitioners) who all work together to provide you with the care you need, when you need it.  We recommend signing up for the patient portal called "MyChart".  Sign up information is provided on this After Visit Summary.  MyChart is used to connect with patients for Virtual Visits (Telemedicine).  Patients are able to view lab/test results, encounter notes, upcoming appointments, etc.  Non-urgent messages can be sent to your provider as well.   To learn more about what you can do with MyChart, go to [email or web address].    Your next appointment:    Keep Appointment   The format for your next appointment:   In Person  Provider:   Pixie Casino, MD     Other Instructions Monitor Blood pressure. Report Blood Pressure consistently greater than 130/80.  Heart Failure Education:  Weigh yourself EVERY morning after you go to the bathroom  but before you eat or drink anything. Write this number down in a weight log/diary. If you gain 3 pounds overnight or 5 pounds in a week, call the office. Take your medicines as prescribed. If you have concerns about your medications, please call us before you stop taking them. Eat low salt foods--Limit salt (sodium) to 2000 mg per day. This will help prevent your body from holding onto fluid. Read food labels as many processed foods have a lot of sodium, especially canned goods and prepackaged meats. If you would like some assistance choosing low sodium foods, we would be happy to set you up with a nutritionist. Limit all fluids for the day to less than 2 liters (64 ounces). Fluid includes all drinks, coffee, juice, ice chips, soup, jello, and all other liquids. Stay as active as you can everyday. Staying active will give you more energy and make your muscles stronger. Start with 5 minutes at a time and work your way up to 30 minutes a day. Break up your activities--do some in the morning and some in the afternoon. Start with 3 days per week and work your way up to 5 days as you can.  If you have chest pain, feel short of breath, dizzy, or lightheaded, STOP. If you don't feel better after a short rest, call 911. If you do feel better, call the office to let us know you have symptoms with exercise.    Important Information About Sugar

## 2022-06-08 NOTE — Progress Notes (Signed)
Office Visit    Patient Name: Dean [name]. Date of Encounter: 06/08/2022  Primary Care Provider:  [name], MD Primary Cardiologist:  Pixie Casino, MD  Chief Complaint    76 year old male with a history of CAD s/p PCI x3 in 1994, bradycardia, PVCs, PAD, AAA, hypertension, hyperlipidemia, OSA on CPAP, CKD stage III-IV, prediabetes, COPD, and tobacco use who presents for follow-up related to bradycardia.  Past Medical History    Past Medical History:  Diagnosis Date   AAA (abdominal aortic aneurysm) ([place])    infrarenal aaa 3.1 x 2.8 per abd/pelvis ct 02-26-20 epic   Allergy    pollen   Anginal pain ([place]) 2005   Arthritis    hips, back, neck    Asthma    Atrial fibrillation ([place])    no documentation in epic/ pt states has never been told has A Fib   CAD (coronary artery disease)    s/p angioplasty and stenting x3 1994, pt states has this done   Cataract    small, mild   Chronic kidney disease    kidney cyst   Complication of anesthesia    woke up during gall bladder sx yrs ago and with colonscopy   Complication of anesthesia    pt thinks stopped breathing after gallbladder sx years ago, no record found   COPD (chronic obstructive pulmonary disease) ([place])    Dyspnea    Dysrhythmia    regular irregular heart beat   GERD (gastroesophageal reflux disease)    hx of   History of hiatal hernia    History of kidney stones    History of prediabetes    none in last 8 months since 40 lb weight loss   Hyperlipidemia    Hypertension    Lower extremity edema since 02-14-2020   swelling goes down when sleeps resolved 06-29-20   Neuromuscular disorder ([place])    nerve issues with back    Peripheral vascular disease ([place])    PVC's (premature ventricular contractions)    pt denies   Sleep apnea    wears cpap auto set 5 to 20   Tuberculosis 1966   Past Surgical History:  Procedure Laterality Date   APPENDECTOMY     CHOLECYSTECTOMY  1990s   Gall Bladder    COLONOSCOPY     CORONARY ANGIOPLASTY WITH STENT PLACEMENT  1994   CYSTOSCOPY WITH INSERTION OF UROLIFT N/A 04/14/2020   Procedure: CYSTOSCOPY WITH INSERTION OF UROLIFT;  Surgeon: Festus Aloe, MD;  Location: [facility];  Service: Urology;  Laterality: N/A;   CYSTOSCOPY WITH STENT PLACEMENT Right 07/07/2020   Procedure: CYSTOSCOPY WITH STENT CHANGE;  Surgeon: Festus Aloe, MD;  Location: [facility];  Service: Urology;  Laterality: Right;   CYSTOSCOPY WITH URETEROSCOPY AND STENT PLACEMENT Bilateral 01/03/2020   Procedure: CYSTOSCOPY WITH RETROGRADE/ URETEROSCOPY AND RIGHT STENT PLACEMENT BLADDER BIOPSY;  Surgeon: Festus Aloe, MD;  Location: [facility];  Service: Urology;  Laterality: Bilateral;  ONLY NEEDS 30 MIN   CYSTOSCOPY WITH URETEROSCOPY AND STENT PLACEMENT Right 04/14/2020   Procedure: CYSTOSCOPY WITH URETEROSCOPY, RETROGRADE  AND STENT REPLACEMENT;  Surgeon: Festus Aloe, MD;  Location: [facility];  Service: Urology;  Laterality: Right;   EYE SURGERY Bilateral 2019   cataracts   lumbar disectomy  2005   L3 - 4 - 5   POLYPECTOMY     PR VEIN BYPASS GRAFT,AORTO-FEM-POP  08/31/11   Right to Left Fem-Fem  RENAL ANGIOGRAPHY N/A 02/14/2020   Procedure: RENAL ANGIOGRAPHY;  Surgeon: Elam Dutch, MD;  Location: [facility];  Service: Cardiovascular;  Laterality: N/A;  Bilateral   ROBOT ASSISTED PYELOPLASTY Right 11/20/2020   Procedure: XI ROBOTIC RIGHT ASSISTED PYELOPLASTY AND STENT PLACEMENT;  Surgeon: Ardis Hughs, MD;  Location: [facility];  Service: Urology;  Laterality: Right;   TONSILLECTOMY  as child   TONSILLECTOMY     TOTAL HIP ARTHROPLASTY Right 07/15/2020   Procedure: TOTAL HIP ARTHROPLASTY ANTERIOR APPROACH;  Surgeon: Gaynelle Arabian, MD;  Location: [facility];  Service: Orthopedics;  Laterality: Right;  [id number]   UMBILICAL HERNIA REPAIR     x 2    Allergies  Allergies  Allergen Reactions    Penicillins     Childhood reaction  REACTION: rapid pulse Did it involve swelling of the face/tongue/throat, SOB, or low BP? Yes Did it involve sudden or severe rash/hives, skin peeling, or any reaction on the inside of your mouth or nose? Unknown Did you need to seek medical attention at a hospital or doctor's office? yes When did it last happen?      childhood  If all above answers are "NO", may proceed with cephalosporin use.    Fish Oil     Other reaction(s): Heart attack symptoms   Other     Oily seafood salmon-- chest pain   Penicillin G     Other reaction(s): Unknown   Mold Extract [Trichophyton] Rash    History of Present Illness    76 year old male with the above past medical history including CAD s/p PCI x3 in 1994, bradycardia, PVCs, PAD, AAA, hypertension, hyperlipidemia, OSA on CPAP, CKD stage III-IV, prediabetes, COPD, and tobacco use.   S/p remote stenting/PCI x3 in 1994.  Echocardiogram in September 2020 showed EF greater than 65%, mild concentric LVH, global normal RV systolic function, no significant valvular abnormalities. Lexiscan Myoview in October 2021 was low risk, no evidence of ischemia. He has a longstanding history of tobacco use. Additionally, he has a history of PAD, s/p prior bilateral common iliac artery stenting, left femoral-femoral bypass for occluded distal aorta in 2021. Renal artery angiography  in 01/2020 showed approximately 50% heavily calcified left renal artery stenosis with no obvious pressure gradient, widely patent right renal arteryrenal artery angiography that showed approximately 50% heavily calcified left renal artery stenosis with no obvious pressure gradient, widely patent right renal artery.   He was last seen in the office on 08/15/2020 and was stable from a cardiac standpoint. EKG did show significant sinus bradycardia, 41 bpm. Patient was asymptomatic at the time. His diltiazem was reduced to 120 mg daily.  He was hospitalized in March  2023 in the setting of acute possible lower GI bleed, secondary to diverticulosis.  Hemoglobin was 9.1 at the time of discharge. Colonoscopy in May 2023 showed 2 colon polyps that were removed, scattered diverticula in the left colon. He has been following with GI. Additionally, he saw vascular surgery in 03/2022.  ABIs were unchanged, he denies claudication. Iliac artery stent was patent on recent CT.  AAA measured 34 mm. Repeat AAA duplex was recommended in 2 years.  His home health nurse contacted our office on 05/13/2022 and reported asymptomatic bradycardia, HR in the 50s to 60s.  He was advised to follow-up in clinic.  He presents today for follow-up.  Since his last visit he has been stable overall from a cardiac standpoint.  He states his PCP prescribed him Lasix and potassium  to take as needed for lower extremity edema. He does note some mild nonpitting bilateral lower extremity edema. He states he has not taking his Lasix for several days as he has been busy with doctors appointments.  He continues to smoke.  He has had some generalized fatigue in the setting of anemia s/p GI bleed. He denies dizziness, presyncope, syncope, palpitations, denies worsening dyspnea or symptoms concerning for angina.  BP and HR have been stable at home. He is asymptomatic with her HR in the 50s at times. Overall, he reports feeling well and other than his ongoing bilateral lower extremity edema, he denies any additional concerns today.  Home Medications    Current Outpatient Medications  Medication Sig Dispense Refill   albuterol (PROVENTIL) (2.5 MG/3ML) 0.083% nebulizer solution USE 1 VIAL VIA NEBULIZER EVERY 6 HOURS AS NEEDED FOR WHEEZING OR SHORTNESS OF BREATH (Patient taking differently: Take 2.5 mg by nebulization every 6 (six) hours as needed for wheezing or shortness of breath.) 225 mL 12   albuterol (VENTOLIN HFA) 108 (90 Base) MCG/ACT inhaler Inhale 1 puff into the lungs every 6 (six) hours as needed for  wheezing or shortness of breath.     antiseptic oral rinse (BIOTENE) LIQD 15 mLs by Mouth Rinse route daily as needed for dry mouth.      aspirin 325 MG tablet Take 1 tablet (325 mg total) by mouth daily. Restart from 03/15/2022     atorvastatin (LIPITOR) 10 MG tablet Take 10 mg by mouth at bedtime.     Azelastine-Fluticasone 137-50 MCG/ACT SUSP PLACE 1-2 SPRAYS INTO EACH NOSTRIL AT BEDTIME (Patient taking differently: Place 1 spray into both nostrils at bedtime.) 23 g 3   diltiazem (DILACOR XR) 120 MG 24 hr capsule Take 1 capsule (120 mg total) by mouth daily. Take 1 capsule (120 mg total) by mouth daily. Schedule office visit for future refills (Patient taking differently: Take 120 mg by mouth daily.) 30 capsule 0   docusate sodium (COLACE) 100 MG capsule Take 100 mg by mouth daily.     esomeprazole (NEXIUM) 40 MG capsule Take 40 mg by mouth daily as needed (acid reflux/indigestion.).      finasteride (PROSCAR) 5 MG tablet Take 5 mg by mouth at bedtime.      Fluticasone-Umeclidin-Vilant (TRELEGY ELLIPTA) 200-62.5-25 MCG/ACT AEPB Inhale 1 puff into the lungs daily.     furosemide (LASIX) 20 MG tablet Take 1 tablet (20 mg total) by mouth daily as needed. 30 tablet 3   irbesartan (AVAPRO) 150 MG tablet Take 150 mg by mouth every evening.      isosorbide mononitrate (IMDUR) 60 MG 24 hr tablet Take 60 mg by mouth every evening.     magnesium oxide (MAG-OX) 400 MG tablet Take 400 mg by mouth every other day.     Menthol, Topical Analgesic, (BLUE-EMU MAXIMUM STRENGTH EX) Apply 1 application topically 3 (three) times daily as needed (pain.).     Polyethyl Glycol-Propyl Glycol (SYSTANE ULTRA OP) Place 1 drop into both eyes daily as needed (Dry eye).     potassium chloride (KLOR-CON M) 10 MEQ tablet Take 1 tablet on the days Lasix is taken. 30 tablet 3   simethicone (MYLICON) 166 MG chewable tablet Chew 125 mg by mouth every 6 (six) hours as needed for flatulence. Takes gas x     Tiotropium  Bromide-Olodaterol (STIOLTO RESPIMAT) 2.5-2.5 MCG/ACT AERS Inhale 2 puffs into the lungs daily. 4 g 12   traMADol (ULTRAM) 50 MG tablet Take 1  tablet (50 mg total) by mouth every 6 (six) hours as needed for moderate pain.     No current facility-administered medications for this visit.     Review of Systems    He denies chest pain, palpitations, pnd, orthopnea, n, v, dizziness, syncope,, weight gain, or early satiety. All other systems reviewed and are otherwise negative except as noted above.   Physical Exam    VS:  BP (!) 154/72 (BP Location: Left Arm, Patient Position: Sitting, Cuff Size: Normal)   Pulse 71   Ht 5' 3.5" (1.613 m)   Wt 170 lb (77.1 kg)   BMI 29.64 kg/m   GEN: Well nourished, well developed, in no acute distress. HEENT: normal. Neck: Supple, no JVD, carotid bruits, or masses. Cardiac: RRR, no murmurs, rubs, or gallops. No clubbing, cyanosis, non-pitting bilateral lower extremity edema.  Radials/DP/PT 2+ and equal bilaterally.  Respiratory:  Respirations regular and unlabored, clear to auscultation bilaterally. GI: Soft, nontender, nondistended, BS + x 4. MS: no deformity or atrophy. Skin: warm and dry, no rash. Neuro:  Strength and sensation are intact. Psych: Normal affect.  Accessory Clinical Findings    ECG personally reviewed by me today -sinus rhythm, 71 bpm- no acute changes.  Lab Results  Component Value Date   WBC 8.6 03/12/2022   HGB 9.1 (L) 03/12/2022   HCT 27.8 (L) 03/12/2022   MCV 97.1 03/12/2022   PLT 177 03/12/2022   Lab Results  Component Value Date   CREATININE 1.59 (H) 03/12/2022   BUN 21 03/12/2022   NA 137 03/12/2022   K 3.9 03/12/2022   CL 105 03/12/2022   CO2 28 03/12/2022   Lab Results  Component Value Date   ALT 9 03/11/2022   AST 14 (L) 03/11/2022   ALKPHOS 62 03/11/2022   BILITOT 0.4 03/11/2022   No results found for: "CHOL", "HDL", "LDLCALC", "LDLDIRECT", "TRIG", "CHOLHDL"  Lab Results  Component Value Date    HGBA1C 6.6 (H) 11/05/2019    Assessment & Plan    1. Sinus bradycardia/PVCs: EKG prior visit in 2021 showed heart rate 41 bpm.  Diltiazem was decreased to 120 mg at the time.  He has had recent HR in the 50s and 60s at home, however, he has been asymptomatic. Will continue diltiazem at present dose. Continue to monitor HR and symptoms.  2.  Bilateral lower extremity edema: Chronic, stable.  Echo in September 2020 showed EF greater than 65%, mild concentric LVH, global normal RV systolic function, no significant valvular abnormalities.  He denies worsening dyspnea, PND, orthopnea, weight gain.  Improves with elevation and compression.  Continue Lasix and potassium as needed.   3. CAD: S/p remote stenting/PCI x3 in 1994.  Lexiscan Myoview in October 2021 was low risk, no evidence of ischemia. Stable with no anginal symptoms. No indication for ischemic evaluation.  Continue aspirin, diltiazem, irbesartan, Imdur, and Lipitor.  4. PAD: S/p prior bilateral common iliac artery stenting, left femoral-femoral bypass for occluded distal aorta in 2021. Renal artery angiography in 01/2020 showed approximately 50% heavily calcified left renal artery stenosis with no obvious pressure gradient, widely patent right renal arteryrenal artery angiography that showed approximately 50% heavily calcified left renal artery stenosis with no obvious pressure gradient, widely patent right renal artery.  Most recent ABIs were stable.  Follows with vascular surgery.  Repeat ABIs recommended in 2 years.  5. AAA: Measuring 3.4 cm on most recent CT abdomen pelvis in March 2023.  Follows with vascular surgery.  Repeat  imaging recommended in 2 years.   6. Recent GI bleed: Most recent hemoglobin was 9.2. Following with GI.  Denies any active bleeding.   7. Hypertension: BP elevated slightly above goal in office today. Recheck BP 140/80.  He states his BP is well controlled at home. Advised him to continue to monitor BP and report BP  consistently >130/80. For now, continue current antihypertensive regimen.  8. Hyperlipidemia: No recent LDL on file.  Monitored and managed per PCP.  Continue aspirin, Lipitor.  9. OSA: Adherent to CPAP.  He denies any concerns today.  10. CKD stage III-IV: Recent creatinine stable at 1.59.  11. COPD: He has stable chronic dyspnea.  Follows with pulmonology.  12. Tobacco use: He continues to smoke.  Full cessation advised.  13. Disposition: Follow-up as scheduled with Dr. Debara Pickett in 10/2022.   Lenna Sciara, NP 06/08/2022, 11:34 AM

## 2022-06-17 DIAGNOSIS — M25552 Pain in left hip: Secondary | ICD-10-CM | POA: Diagnosis not present

## 2022-06-17 DIAGNOSIS — M545 Low back pain, unspecified: Secondary | ICD-10-CM | POA: Diagnosis not present

## 2022-06-29 ENCOUNTER — Encounter: Payer: Self-pay | Admitting: Internal Medicine

## 2022-06-29 NOTE — Progress Notes (Signed)
Patient ID: Dean [name], male    DOB: [birth year], 76 y.o.   MRN: [id number]  HPI M  Smoker followed for COPD, obstructive sleep apnea, lung nodule, allergic rhinitis. NPSG 04/06/1999   AHI 87/hr, desaturation to 83%, body weight 185 pounds PFT- /04/2009-severe obstructive airways disease with insignificant response to bronchodilator. FVC 3.18/83%, FEV1 1.53/56%, ratio 0.48, TLC 86%, DLCO 51% Office Spirometry 03/20/2017-very severe obstructive airways disease. FVC 1.64/46%, FEV1 0.73/28%, ratio 0.45 ----------------------------------------------------------------------------------------------  12/30/21- [name]  Smoker (51 pkyrs) followed for COPD, allergic rhinitis, , Obstructive Sleep Apnea, lung nodule, allergic rhinitis., complicated by PAOD, HBP, CAD, GERD, Parotid Mass (Dr Rosen/ ENT) -Proventil hfa, neb albuterol, Dymista nasal, Adderall 10 bid,  CPAP auto 5-20/ [employer]-compliance 100%, AHI 0.5/ hr Body weight today- Covid vax-4 Phizer Flu vax-had -----Patient has no concerns at this time, feels like his breathing is about the same as last visit. He tells me he feels "bad all over", breathing not good.  He has a cleaning person come to his home who uses cleansers with strong odors that are irritating.  Weather changes also make a difference.  No acute infection.  Variable cough but phlegm remains white with no blood.  He denies adenopathy or chest pain. 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  Cycles of diarrhea and constipation attributed to IBS. CXR-brings disc from Dr. Jacquiline Doe office.  To my read it shows COPD with overinflation and interstitial thickening but no acute process.  06/30/22-[name]  Smoker (51 pkyrs) followed for COPD, allergic rhinitis, , Obstructive Sleep Apnea, lung nodule, allergic rhinitis., complicated by PAD, HTN, CAD, AAA, GERD, Parotid Mass (Dr Rosen/ ENT), DM2,  -Proventil hfa, neb albuterol, Dymista nasal, Adderall 10 bid,  CPAP auto 5-20/  [employer]-compliance 97%, AHI 0.7/ hr Body weight today-172 lbs Covid vax-4 Phizer -----Patient feels like he is doing good, states that he stays up to late at night. No concerns Still smoking. Stays up late, then sleeps ok.  Hosp for GIB in March. Breathing no better and understands role of smoking. Preferred Anoro over [employer]. 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 Heart size is normal. No pneumothorax or pleural effusion. Aortic atherosclerosis. No acute or focal bony abnormality. IMPRESSION: No acute disease.  ROS-see HPI  + = positive Constitutional:  +   weight loss, night sweats, fevers, chills, f+atigue, lassitude. HEENT:   No-  headaches, difficulty swallowing, tooth/dental problems, sore throat,       No-  sneezing, itching, ear ache, +nasal congestion, post nasal drip,  CV:  No-   chest pain, orthopnea, PND, swelling in lower extremities, anasarca,  dizziness, palpitations Resp: + shortness of breath with exertion or at rest.           +   productive cough,  + non-productive cough,  No- coughing up of blood.              No-   change in color of mucus.  + wheezing.   Skin: No-   rash or lesions. GI:  No-   heartburn, indigestion, abdominal pain, nausea, vomiting, + diarrhea GU:  MS:  + joint pain or swelling, + back pain . Neuro-     nothing unusual Psych:  No- change in mood or affect. No depression or anxiety.  No memory loss.  Objective:  OBJ- Physical Exam General- Alert, Oriented, Affect-appropriate, Distress- none acute.   +rolling walker Skin- rash-none, lesions- none, excoriation- none Lymphadenopathy- none Head- atraumatic  Eyes- Gross vision intact, PERRLA, conjunctivae and secretions clear            Ears- Hearing, canals-normal            Nose- + Turbinate edema, no-Septal dev, mucus, polyps, erosion, perforation             Throat- Mallampati III , mucosa clear , drainage- none, tonsils- atrophic Neck-  flexible , trachea midline, no stridor , thyroid nl, carotid no bruit, + mass left submandibular is less evident Chest - symmetrical excursion , unlabored           Heart/CV- RRR , no murmur , no gallop  , no rub, nl s1 s2                           - JVD- none , edema- none, stasis changes- none, varices- none           Lung- + diminished/few crackles, wheeze- none,  Cough+light, dullness-none, rub- none           Chest wall-  Abd-  Br/ Gen/ Rectal- Not done, not indicated Extrem- cyanosis- none, clubbing, none, atrophy- none, strength- nl, + rolling walker Neuro- grossly intact to observation

## 2022-06-30 ENCOUNTER — Encounter: Payer: Self-pay | Admitting: Internal Medicine

## 2022-06-30 ENCOUNTER — Ambulatory Visit (INDEPENDENT_AMBULATORY_CARE_PROVIDER_SITE_OTHER): Payer: Medicare PPO

## 2022-06-30 ENCOUNTER — Ambulatory Visit: Payer: Medicare PPO | Admitting: Internal Medicine

## 2022-06-30 VITALS — BP 134/62 | HR 59 | Temp 98.2°F | Ht 64.0 in | Wt 172.4 lb

## 2022-06-30 DIAGNOSIS — F17200 Nicotine dependence, unspecified, uncomplicated: Secondary | ICD-10-CM

## 2022-06-30 DIAGNOSIS — J449 Chronic obstructive pulmonary disease, unspecified: Secondary | ICD-10-CM | POA: Diagnosis not present

## 2022-06-30 DIAGNOSIS — J439 Emphysema, unspecified: Secondary | ICD-10-CM | POA: Diagnosis not present

## 2022-06-30 DIAGNOSIS — G4733 Obstructive sleep apnea (adult) (pediatric): Secondary | ICD-10-CM | POA: Diagnosis not present

## 2022-06-30 MED ORDER — BREZTRI AEROSPHERE 160-9-4.8 MCG/ACT IN AERO: 2.0000 | INHALATION_SPRAY | Freq: Two times a day (BID) | RESPIRATORY_TRACT | 0 refills | Status: DC

## 2022-06-30 NOTE — Patient Instructions (Signed)
Order- CXR    dx COPD mixed type  Order sample x 2 Breztri    inhale 2 puffs then rinse mouth, twice daily Try this instead of Stiolto. If you like it, check with your pharmacy about cost. We can send a script for this, or for Bevespi, which is this manufacturer's equivalent of the Anoro you liked.

## 2022-07-13 ENCOUNTER — Ambulatory Visit: Payer: Medicare PPO | Admitting: Podiatry

## 2022-07-13 ENCOUNTER — Encounter: Payer: Self-pay | Admitting: Podiatry

## 2022-07-13 DIAGNOSIS — M79676 Pain in unspecified toe(s): Secondary | ICD-10-CM

## 2022-07-13 DIAGNOSIS — L84 Corns and callosities: Secondary | ICD-10-CM | POA: Diagnosis not present

## 2022-07-13 DIAGNOSIS — B351 Tinea unguium: Secondary | ICD-10-CM | POA: Diagnosis not present

## 2022-07-13 DIAGNOSIS — I739 Peripheral vascular disease, unspecified: Secondary | ICD-10-CM

## 2022-07-18 NOTE — Progress Notes (Signed)
  Subjective:  Patient ID: Dean Filter., male    DOB: [birth year],  MRN: [id number]  Dean Music Melody Comas. presents to clinic today for for at risk foot care. Patient has h/o PAD and callus(es) left lower extremity and painful thick toenails that are difficult to trim. Painful toenails interfere with ambulation. Aggravating factors include wearing enclosed shoe gear. Pain is relieved with periodic professional debridement. Painful calluses are aggravated when weightbearing with and without shoegear. Pain is relieved with periodic professional debridement.  New problem(s): None.   PCP is [name], MD , and last visit was  May 23, 2022  Allergies  Allergen Reactions   Penicillins     Childhood reaction  REACTION: rapid pulse Did it involve swelling of the face/tongue/throat, SOB, or low BP? Yes Did it involve sudden or severe rash/hives, skin peeling, or any reaction on the inside of your mouth or nose? Unknown Did you need to seek medical attention at a hospital or doctor's office? yes When did it last happen?      childhood  If all above answers are "NO", may proceed with cephalosporin use.    Fish Oil     Other reaction(s): Heart attack symptoms   Other     Oily seafood salmon-- chest pain   Penicillin G     Other reaction(s): Unknown   Mold Extract [Trichophyton] Rash    Review of Systems: Negative except as noted in the HPI.  Objective: No changes noted in today's physical examination.  General: Dean Filter. is a pleasant 76 y.o. Caucasian male, WD, WN in NAD. AAO x 3.   Vascular:  CFT <4 seconds b/l LE. Faintly palpable DP pulses b/l LE. Diminished PT pulse(s) b/l LE. Lower extremity skin temperature gradient within normal limits. No ischemia or gangrene noted b/l LE. No cyanosis or clubbing noted b/l LE.  Dermatological:  Pedal skin thin and atrophic b/l LE. No open wounds b/l lower extremities. No interdigital macerations b/l lower extremities.  Toenails 2-5 bilaterally and R hallux elongated, discolored, dystrophic, thickened, and crumbly with subungual debris and tenderness to dorsal palpation. Anonychia noted b/l hallux. Nailbed(s) epithelialized.  Hyperkeratotic lesion(s) L hallux. No erythema, no edema, no drainage, no fluctuance.  Musculoskeletal:  Normal muscle strength 5/5 to all lower extremity muscle groups bilaterally. No pain crepitus or joint limitation noted with ROM b/l. Hallux valgus with bunion deformity noted b/l lower extremities.  Neurological:  Protective sensation intact 5/5 intact bilaterally with 10g monofilament b/l. Vibratory sensation intact b/l.  Assessment/Plan: 1. Pain due to onychomycosis of toenail   2. Callus   3. PAD (peripheral artery disease) ([place])     -Patient was evaluated and treated. All patient's and/or POA's questions/concerns answered on today's visit. -Patient to continue soft, supportive shoe gear daily. -Mycotic toenails 2-5 bilaterally were debrided in length and girth with sterile nail nippers and dremel without iatrogenic bleeding. -Callus(es) medial IPJ of left great toe pared utilizing sterile scalpel blade without complication or incident. Total number debrided =1. -Patient/POA to call should there be question/concern in the interim.   Return in about 3 months (around 10/13/2022).  [name], DPM

## 2022-08-09 ENCOUNTER — Other Ambulatory Visit: Payer: Self-pay | Admitting: Internal Medicine

## 2022-08-11 DIAGNOSIS — Z961 Presence of intraocular lens: Secondary | ICD-10-CM | POA: Diagnosis not present

## 2022-08-11 DIAGNOSIS — H26491 Other secondary cataract, right eye: Secondary | ICD-10-CM | POA: Diagnosis not present

## 2022-08-11 DIAGNOSIS — H52203 Unspecified astigmatism, bilateral: Secondary | ICD-10-CM | POA: Diagnosis not present

## 2022-08-11 DIAGNOSIS — H02401 Unspecified ptosis of right eyelid: Secondary | ICD-10-CM | POA: Diagnosis not present

## 2022-08-15 ENCOUNTER — Encounter: Payer: Self-pay | Admitting: Internal Medicine

## 2022-08-15 NOTE — Assessment & Plan Note (Addendum)
Prefers Anoro. Anemia at time of GIB was additional challenge to O2 delivery. Expect slow gradual decline unless an acute event develops. Agrees to try samples of Breztri instead of Anoro or [employer]. Plan- sample Breztri. Emphasize smoking cessation

## 2022-08-15 NOTE — Assessment & Plan Note (Signed)
Benefits from CPAP- Plan continue auto 5-20 

## 2022-08-15 NOTE — Assessment & Plan Note (Signed)
Refractory, making no effort to stop despite counseling.

## 2022-09-08 ENCOUNTER — Other Ambulatory Visit: Payer: Self-pay | Admitting: Internal Medicine

## 2022-09-12 MED ORDER — AZELASTINE-FLUTICASONE 137-50 MCG/ACT NA SUSP: 1.0000 | Freq: Every day | NASAL | 3 refills | Status: DC

## 2022-09-22 DIAGNOSIS — R972 Elevated prostate specific antigen [PSA]: Secondary | ICD-10-CM | POA: Diagnosis not present

## 2022-09-22 DIAGNOSIS — R3912 Poor urinary stream: Secondary | ICD-10-CM | POA: Diagnosis not present

## 2022-09-22 DIAGNOSIS — N401 Enlarged prostate with lower urinary tract symptoms: Secondary | ICD-10-CM | POA: Diagnosis not present

## 2022-09-24 DIAGNOSIS — Z23 Encounter for immunization: Secondary | ICD-10-CM | POA: Diagnosis not present

## 2022-10-07 DIAGNOSIS — E1159 Type 2 diabetes mellitus with other circulatory complications: Secondary | ICD-10-CM | POA: Diagnosis not present

## 2022-10-07 DIAGNOSIS — I10 Essential (primary) hypertension: Secondary | ICD-10-CM | POA: Diagnosis not present

## 2022-10-07 DIAGNOSIS — E785 Hyperlipidemia, unspecified: Secondary | ICD-10-CM | POA: Diagnosis not present

## 2022-10-07 DIAGNOSIS — R7989 Other specified abnormal findings of blood chemistry: Secondary | ICD-10-CM | POA: Diagnosis not present

## 2022-10-10 DIAGNOSIS — Z1331 Encounter for screening for depression: Secondary | ICD-10-CM | POA: Diagnosis not present

## 2022-10-10 DIAGNOSIS — E1159 Type 2 diabetes mellitus with other circulatory complications: Secondary | ICD-10-CM | POA: Diagnosis not present

## 2022-10-10 DIAGNOSIS — I2510 Atherosclerotic heart disease of native coronary artery without angina pectoris: Secondary | ICD-10-CM | POA: Diagnosis not present

## 2022-10-10 DIAGNOSIS — Z0000 Encounter for general adult medical examination without abnormal findings: Secondary | ICD-10-CM | POA: Diagnosis not present

## 2022-10-10 DIAGNOSIS — I129 Hypertensive chronic kidney disease with stage 1 through stage 4 chronic kidney disease, or unspecified chronic kidney disease: Secondary | ICD-10-CM | POA: Diagnosis not present

## 2022-10-10 DIAGNOSIS — M48061 Spinal stenosis, lumbar region without neurogenic claudication: Secondary | ICD-10-CM | POA: Diagnosis not present

## 2022-10-10 DIAGNOSIS — D649 Anemia, unspecified: Secondary | ICD-10-CM | POA: Diagnosis not present

## 2022-10-10 DIAGNOSIS — F17200 Nicotine dependence, unspecified, uncomplicated: Secondary | ICD-10-CM | POA: Diagnosis not present

## 2022-10-10 DIAGNOSIS — E785 Hyperlipidemia, unspecified: Secondary | ICD-10-CM | POA: Diagnosis not present

## 2022-10-10 DIAGNOSIS — R82998 Other abnormal findings in urine: Secondary | ICD-10-CM | POA: Diagnosis not present

## 2022-10-10 DIAGNOSIS — Z1339 Encounter for screening examination for other mental health and behavioral disorders: Secondary | ICD-10-CM | POA: Diagnosis not present

## 2022-10-10 DIAGNOSIS — N1832 Chronic kidney disease, stage 3b: Secondary | ICD-10-CM | POA: Diagnosis not present

## 2022-10-26 ENCOUNTER — Ambulatory Visit: Payer: Medicare PPO | Admitting: Podiatry

## 2022-10-26 ENCOUNTER — Encounter: Payer: Self-pay | Admitting: Podiatry

## 2022-10-26 DIAGNOSIS — M79676 Pain in unspecified toe(s): Secondary | ICD-10-CM

## 2022-10-26 DIAGNOSIS — B351 Tinea unguium: Secondary | ICD-10-CM

## 2022-10-26 DIAGNOSIS — I739 Peripheral vascular disease, unspecified: Secondary | ICD-10-CM

## 2022-10-26 NOTE — Progress Notes (Signed)
  Subjective:  Patient ID: Dean [name]., male    DOB: 1946-04-15,  MRN: [id number]  Dean Music Melody Comas. presents to clinic today for at risk foot care. Patient has h/o PAD and callus(es) b/l lower extremities and painful thick toenails that are difficult to trim. Painful toenails interfere with ambulation. Aggravating factors include wearing enclosed shoe gear. Pain is relieved with periodic professional debridement. Painful calluses are aggravated when weightbearing with and without shoegear. Pain is relieved with periodic professional debridement.  Chief Complaint  Patient presents with   Nail Problem    Nail Trim, Not Diabetic PCP - Dr Reynaldo Minium   New problem(s): None.   PCP is [name], MD.  Allergies  Allergen Reactions   Penicillins     Childhood reaction  REACTION: rapid pulse Did it involve swelling of the face/tongue/throat, SOB, or low BP? Yes Did it involve sudden or severe rash/hives, skin peeling, or any reaction on the inside of your mouth or nose? Unknown Did you need to seek medical attention at a hospital or doctor's office? yes When did it last happen?      childhood  If all above answers are "NO", may proceed with cephalosporin use.    Fish Oil     Other reaction(s): Heart attack symptoms   Other     Oily seafood salmon-- chest pain   Penicillin G     Other reaction(s): Unknown   Mold Extract [Trichophyton] Rash    Review of Systems: Negative except as noted in the HPI.  Objective: No changes noted in today's physical examination.  Dean J [employer]. is a pleasant 76 y.o. male WD, WN in NAD. AAO x 3.  Vascular:  CFT <4 seconds b/l LE. Faintly palpable DP pulses b/l LE. Diminished PT pulse(s) b/l LE. Lower extremity skin temperature gradient within normal limits. No ischemia or gangrene noted b/l LE. No cyanosis or clubbing noted b/l LE.  Dermatological:  Pedal skin thin and atrophic b/l LE. No open wounds b/l lower extremities. No  interdigital macerations b/l lower extremities. Toenails 2-5 bilaterally and R hallux elongated, discolored, dystrophic, thickened, and crumbly with subungual debris and tenderness to dorsal palpation. Anonychia noted b/l hallux. Nailbed(s) epithelialized.  Hyperkeratotic lesion(s) L hallux. No erythema, no edema, no drainage, no fluctuance.  Musculoskeletal:  Normal muscle strength 5/5 to all lower extremity muscle groups bilaterally. No pain crepitus or joint limitation noted with ROM b/l. Hallux valgus with bunion deformity noted b/l lower extremities.  Neurological:  Protective sensation intact 5/5 intact bilaterally with 10g monofilament b/l. Vibratory sensation intact b/l. Assessment/Plan: 1. Pain due to onychomycosis of toenail   2. PAD (peripheral artery disease) (Dean [name])     No orders of the defined types were placed in this encounter.   -Patient was evaluated and treated. All patient's and/or POA's questions/concerns answered on today's visit. -Mycotic toenails 1-5 bilaterally were debrided in length and girth with sterile nail nippers and dremel without incident. -Callus(es) L hallux and 1st metatarsal head left foot pared utilizing sharp debridement with sterile blade without complication or incident. Total number debrided =2. -Patient/POA to call should there be question/concern in the interim.   Return in about 3 months (around 01/26/2023).  Dean [name], DPM

## 2022-11-03 ENCOUNTER — Ambulatory Visit: Payer: Medicare PPO | Attending: Internal Medicine | Admitting: Internal Medicine

## 2022-11-03 ENCOUNTER — Encounter: Payer: Self-pay | Admitting: Internal Medicine

## 2022-11-03 VITALS — BP 137/67 | HR 60 | Ht 64.0 in | Wt 162.0 lb

## 2022-11-03 DIAGNOSIS — G4733 Obstructive sleep apnea (adult) (pediatric): Secondary | ICD-10-CM

## 2022-11-03 DIAGNOSIS — R0602 Shortness of breath: Secondary | ICD-10-CM | POA: Diagnosis not present

## 2022-11-03 DIAGNOSIS — J449 Chronic obstructive pulmonary disease, unspecified: Secondary | ICD-10-CM

## 2022-11-03 DIAGNOSIS — I739 Peripheral vascular disease, unspecified: Secondary | ICD-10-CM | POA: Diagnosis not present

## 2022-11-03 DIAGNOSIS — I2510 Atherosclerotic heart disease of native coronary artery without angina pectoris: Secondary | ICD-10-CM | POA: Diagnosis not present

## 2022-11-03 NOTE — Progress Notes (Signed)
OFFICE NOTE  Chief Complaint:  Shortness of breath with exertion  Primary Care Physician: [name], MD  HPI:  Dean [name]. is a pleasant 76 year old male who is currently referred to me for evaluation of abnormal EKG. Recently he's had some worsening shortness of breath. He also significant back problems and other medical issues. He is a long-standing smoker with a history of hypertension, dyslipidemia and PAD. He recently had a left femoral-femoral bypass for occluded distal aorta. He had a routine physical performed which demonstrated an abnormal EKG done which showed inferior ST depression and PVCs. This is apparently a new finding. He denies any anginal complaints. He is not particularly active due to problems with his back and peripheral arterial disease.  I saw [name] back today For follow-up of his stress test. This is negative for ischemia and showed a small fixed defect which is likely artifact, however scar cannot be excluded as it was a non-gated study. This is somewhat reassuring suggesting that shortness of breath may be more pulmonary in nature. He has reported more persistent, productive cough. He also has a dry cough. He's wondering if this could be related to his lisinopril. He's also noted that his blood pressure is not as well-controlled as it had been in the past with recent increases in diastolic pressure.   [name] returns today for follow-up. He denies any chest pain or worsening shortness of breath. He told me on New Year's Eve he came down with the flu and had cough for about a month. Eventually he has improved. He continues to have some PVCs bits asymptomatic with this. He denies any claudication symptoms but does have history of femoral femoral bypass grafting. He is followed by vascular surgery for this. Cholesterol is followed by his primary care provider.  02/06/2017  Mr. Sweatt returns today for follow-up. Since I last saw him he feels  like he is doing fairly well. Unfortunately he is not able to stop smoking due to significant stresses in his life. This is despite having chronic lung disease, PAD and coronary artery disease. He's not had any upper respiratory infections this year. He is followed by vascular surgery due to his history of femoral bypass grafting. He denies any chest pain. EKG shows sinus bradycardia with nonspecific interventricular conduction delay. Blood pressure is well-controlled today.  01/30/2018  [name] returns today for follow-up.  I last saw him about a year ago.  Unfortunately continues to smoke.  Was recently seen by his pulmonologist to noted that he has severe COPD.  He has been struggling with low back pain as well as right hip pain.  He is received 2 injections of the right hip and still has a gnawing pain.  Apparently he has no cartilage left in the joint and is possibly facing hip replacement.  He is being cared for by Dr. Mardelle Matte.  Blood pressure is mildly elevated today.  He denies any chest pain or worsening shortness of breath.  Recent labs in November 2018 showed total cholesterol 104, HDL 32, LDL 53 and triglycerides 96.  Hemoglobin A1c of 7.1 and serum creatinine 1.2.  08/15/2020  [name] is seen today for follow-up.  He underwent recent hip surgery and is doing better.  He denies any chest pain or worsening shortness of breath.  Blood pressure appears excellent today.  EKG does show a significant marked sinus bradycardia at 41 however he says he denies any fatigue, chest pain, presyncope or other  possible related symptoms.  His most recent lipid profile showed total cholesterol 95, HDL 35, LDL 46 and triglycerides 71, A1c of 5.5.  11/03/2022  [name] returns today for follow-up.  He seems to be doing fairly well.  He has been seen by our advanced practice practitioners.  He continues to follow with pulmonary.  Unfortunately continues to smoke.  He has been short of breath with exertion.   Blood pressure is well controlled today.  An EKG shows sinus rhythm with a PVC.  He has had reasonably low cholesterol with his last LDL of 58.  His PAD is followed by vascular surgery.  He denies any worsening claudication.  He also denies any chest pain.  PMHx:  Past Medical History:  Diagnosis Date   AAA (abdominal aortic aneurysm) ([place])    infrarenal aaa 3.1 x 2.8 per abd/pelvis ct 02-26-20 epic   Allergy    pollen   Anginal pain ([place]) 2005   Arthritis    hips, back, neck    Asthma    Atrial fibrillation ([place])    no documentation in epic/ pt states has never been told has A Fib   CAD (coronary artery disease)    s/p angioplasty and stenting x3 1994, pt states has this done   Cataract    small, mild   Chronic kidney disease    kidney cyst   Complication of anesthesia    woke up during gall bladder sx yrs ago and with colonscopy   Complication of anesthesia    pt thinks stopped breathing after gallbladder sx years ago, no record found   COPD (chronic obstructive pulmonary disease) ([place])    Dyspnea    Dysrhythmia    regular irregular heart beat   GERD (gastroesophageal reflux disease)    hx of   History of hiatal hernia    History of kidney stones    History of prediabetes    none in last 8 months since 40 lb weight loss   Hyperlipidemia    Hypertension    Lower extremity edema since 02-14-2020   swelling goes down when sleeps resolved 06-29-20   Neuromuscular disorder ([place])    nerve issues with back    Peripheral vascular disease ([place])    PVC's (premature ventricular contractions)    pt denies   Sleep apnea    wears cpap auto set 5 to 20   Tuberculosis 1966    Past Surgical History:  Procedure Laterality Date   APPENDECTOMY     CHOLECYSTECTOMY  1990s   Gall Bladder   COLONOSCOPY     CORONARY ANGIOPLASTY WITH STENT PLACEMENT  1994   CYSTOSCOPY WITH INSERTION OF UROLIFT N/A 04/14/2020   Procedure: CYSTOSCOPY WITH INSERTION OF UROLIFT;  Surgeon: Festus Aloe,  MD;  Location: [facility];  Service: Urology;  Laterality: N/A;   CYSTOSCOPY WITH STENT PLACEMENT Right 07/07/2020   Procedure: CYSTOSCOPY WITH STENT CHANGE;  Surgeon: Festus Aloe, MD;  Location: [facility];  Service: Urology;  Laterality: Right;   CYSTOSCOPY WITH URETEROSCOPY AND STENT PLACEMENT Bilateral 01/03/2020   Procedure: CYSTOSCOPY WITH RETROGRADE/ URETEROSCOPY AND RIGHT STENT PLACEMENT BLADDER BIOPSY;  Surgeon: Festus Aloe, MD;  Location: [facility];  Service: Urology;  Laterality: Bilateral;  ONLY NEEDS 30 MIN   CYSTOSCOPY WITH URETEROSCOPY AND STENT PLACEMENT Right 04/14/2020   Procedure: CYSTOSCOPY WITH URETEROSCOPY, RETROGRADE  AND STENT REPLACEMENT;  Surgeon: Festus Aloe, MD;  Location[facility];  Service: Urology;  Laterality: Right;   EYE SURGERY Bilateral 2019   cataracts   lumbar disectomy  2005   L3 - 4 - 5   POLYPECTOMY     PR VEIN BYPASS GRAFT,AORTO-FEM-POP  08/31/11   Right to Left Fem-Fem   RENAL ANGIOGRAPHY N/A 02/14/2020   Procedure: RENAL ANGIOGRAPHY;  Surgeon: Elam Dutch, MD;  Location: [facility];  Service: Cardiovascular;  Laterality: N/A;  Bilateral   ROBOT ASSISTED PYELOPLASTY Right 11/20/2020   Procedure: XI ROBOTIC RIGHT ASSISTED PYELOPLASTY AND STENT PLACEMENT;  Surgeon: Ardis Hughs, MD;  Location: [facility];  Service: Urology;  Laterality: Right;   TONSILLECTOMY  as child   TONSILLECTOMY     TOTAL HIP ARTHROPLASTY Right 07/15/2020   Procedure: TOTAL HIP ARTHROPLASTY ANTERIOR APPROACH;  Surgeon: Gaynelle Arabian, MD;  Location: [facility];  Service: Orthopedics;  Laterality: Right;  [id number]   UMBILICAL HERNIA REPAIR     x 2    FAMHx:  Family History  Problem Relation Age of Onset   Other Father        ETOH   Diabetes Father    Hypertension Father    Kidney cancer Mother    Cancer Mother        renal   Hyperlipidemia Mother    Hypertension Mother    Cancer  Sister        breast   Hyperlipidemia Sister    Hypertension Sister    Breast cancer Sister    Hyperlipidemia Brother    Hypertension Brother    Prostate cancer Brother    Colon cancer Neg Hx    Colon polyps Neg Hx    Esophageal cancer Neg Hx    Rectal cancer Neg Hx    Stomach cancer Neg Hx     SOCHx:   reports that he has been smoking cigarettes. He has a 51.00 pack-year smoking history. He has never been exposed to tobacco smoke. He has never used smokeless tobacco. He reports that he does not currently use alcohol. He reports that he does not use drugs.  ALLERGIES:  Allergies  Allergen Reactions   Penicillins     Childhood reaction  REACTION: rapid pulse Did it involve swelling of the face/tongue/throat, SOB, or low BP? Yes Did it involve sudden or severe rash/hives, skin peeling, or any reaction on the inside of your mouth or nose? Unknown Did you need to seek medical attention at a hospital or doctor's office? yes When did it last happen?      childhood  If all above answers are "NO", may proceed with cephalosporin use.    Fish Oil     Other reaction(s): Heart attack symptoms   Other     Oily seafood salmon-- chest pain   Penicillin G     Other reaction(s): Unknown   Mold Extract [Trichophyton] Rash    ROS: Pertinent items noted in HPI and remainder of comprehensive ROS otherwise negative.  HOME MEDS: Current Outpatient Medications  Medication Sig Dispense Refill   albuterol (PROVENTIL) (2.5 MG/3ML) 0.083% nebulizer solution USE 1 VIAL VIA NEBULIZER EVERY 6 HOURS AS NEEDED FOR WHEEZING OR SHORTNESS OF BREATH (Patient taking differently: Take 2.5 mg by nebulization every 6 (six) hours as needed for wheezing or shortness of breath.) 225 mL 12   albuterol (VENTOLIN HFA) 108 (90 Base) MCG/ACT inhaler INHALE 1 PUFF INTO THE LUNGS TWICE DAILY 8.5 g 5   antiseptic oral rinse (BIOTENE) LIQD 15 mLs by Mouth Rinse route daily as  needed for dry mouth.      aspirin 325 MG  tablet 1 tablet Orally Once a day     atorvastatin (LIPITOR) 10 MG tablet Take 1 tablet by mouth daily.     Azelastine-Fluticasone 137-50 MCG/ACT SUSP Place 1-2 sprays into both nostrils at bedtime. 23 g 3   diltiazem (CARDIZEM CD) 120 MG 24 hr capsule Take by mouth.     diltiazem (DILACOR XR) 120 MG 24 hr capsule TAKE 1 CAPSULE(120 MG) BY MOUTH DAILY 90 capsule 0   docusate sodium (COLACE) 100 MG capsule Take 100 mg by mouth daily.     doxycycline (VIBRAMYCIN) 100 MG capsule Take 1 capsule by mouth 2 (two) times daily.     esomeprazole (NEXIUM) 40 MG capsule Take 40 mg by mouth daily as needed (acid reflux/indigestion.).      finasteride (PROSCAR) 5 MG tablet Take 1 tablet by mouth daily.     furosemide (LASIX) 20 MG tablet Take 1 tablet (20 mg total) by mouth daily as needed. 30 tablet 3   hydroxypropyl methylcellulose / hypromellose (GONAK) 2.5 % ophthalmic solution 1 [drp] by ophthalmic route.     irbesartan (AVAPRO) 150 MG tablet Take 150 mg by mouth every evening.      isosorbide mononitrate (IMDUR) 60 MG 24 hr tablet Take 60 mg by mouth every evening.     loperamide (IMODIUM A-D) 2 MG tablet 2 mg by oral route.     Menthol, Topical Analgesic, (BLUE-EMU MAXIMUM STRENGTH EX) Apply 1 application topically 3 (three) times daily as needed (pain.).     methocarbamol (ROBAXIN) 500 MG tablet 500 mg by oral route.     mupirocin ointment (BACTROBAN) 2 % SMARTSIG:sparingly Topical Twice Daily     Na Sulfate-K Sulfate-Mg Sulf 17.5-3.13-1.6 GM/177ML SOLN SMARTSIG:1 Kit(s) By Mouth Once     nitrofurantoin (MACRODANTIN) 50 MG capsule 50 mg by oral route.     Polyethyl Glycol-Propyl Glycol (SYSTANE ULTRA OP) Place 1 drop into both eyes daily as needed (Dry eye).     potassium chloride (KLOR-CON M) 10 MEQ tablet Take 1 tablet on the days Lasix is taken. 30 tablet 3   simethicone (MYLICON) 944 MG chewable tablet Chew 125 mg by mouth every 6 (six) hours as needed for flatulence. Takes gas x     tamsulosin  (FLOMAX) 0.4 MG CAPS capsule Take 1 capsule by mouth at bedtime.     Tiotropium Bromide-Olodaterol (STIOLTO RESPIMAT) 2.5-2.5 MCG/ACT AERS Inhale 2 puffs into the lungs daily. 4 g 12   traMADol (ULTRAM) 50 MG tablet Take 1 tablet by mouth 4 (four) times daily as needed.     MAGNESIUM-OXIDE 400 (240 Mg) MG tablet 1 tablet with food Orally every other day for 90 days     No current facility-administered medications for this visit.    LABS/IMAGING: No results found for this or any previous visit (from the past 48 hour(s)). No results found.  VITALS: BP 137/67 (BP Location: Left Arm, Patient Position: Sitting)   Pulse 60   Ht _0  (1.626 m)   Wt 162 lb (73.5 kg)   SpO2 95%   BMI 27.81 kg/m   EXAM: General appearance: alert and no distress Neck: no carotid bruit and no JVD Lungs: clear to auscultation bilaterally Heart: Regular bradycardia Abdomen: soft, non-tender; bowel sounds normal; no masses,  no organomegaly Extremities: extremities normal, atraumatic, no cyanosis or edema Pulses: 2+ and symmetric Skin: Skin color, texture, turgor normal. No rashes or lesions Neurologic: Grossly  normal Psych: Pleasant  EKG: Sinus rhythm with PACs that are aberrantly conducted, lateral ST and T wave changes at 60-personally reviewed  ASSESSMENT: CAD status post PCI 3 in 1994 Abnormal EKG with ischemic findings - negative nuclear stress test (2014) PVCs Peripheral arterial disease status post left femorofemoral bypass Hypertension Dyslipidemia Smoking Obesity  COPD  PLAN: 1.   [name] seems to be stable without any chest pain or worsening shortness of breath.  His PAD is followed closely by vascular surgery.  He continues to smoke and has COPD followed by Dr. Annamaria Boots.  He is asymptomatic with his PVCs and has had no chest pain.  Blood pressures controlled and his cholesterol has been at goal.  No changes to his medicines today.  His last echo was in 2020.  This showed normal LVEF  and mild diastolic dysfunction with aortic valve sclerosis.  As he is short of breath, would like to repeat an updated echo.  Otherwise, plan follow-up with me annually or sooner as necessary.  Pixie Casino, MD, [facility], [place] Director of the Advanced Lipid Disorders &  Cardiovascular Risk Reduction Clinic Diplomate of the American Board of Clinical Lipidology Attending Cardiologist  Direct Dial: [phone]  Fax: [phone]  Website:  [email or web address] 11/03/2022, 1:50 PM

## 2022-11-03 NOTE — Patient Instructions (Signed)
Medication Instructions:  Continue same medications *If you need a refill on your cardiac medications before your next appointment, please call your pharmacy*   Lab Work: None ordered   Testing/Procedures: Echo   Follow-Up: At [facility], you and your health needs are our priority.  As part of our continuing mission to provide you with exceptional heart care, we have created designated Provider Care Teams.  These Care Teams include your primary Cardiologist (physician) and Advanced Practice Providers (APPs -  Physician Assistants and Nurse Practitioners) who all work together to provide you with the care you need, when you need it.  We recommend signing up for the patient portal called "MyChart".  Sign up information is provided on this After Visit Summary.  MyChart is used to connect with patients for Virtual Visits (Telemedicine).  Patients are able to view lab/test results, encounter notes, upcoming appointments, etc.  Non-urgent messages can be sent to your provider as well.   To learn more about what you can do with MyChart, go to [email or web address].    Your next appointment: 1 Year   Call in August to schedule Nov appointment     The format for your next appointment: Office   Provider:  Dr.Hilty   Important Information About [place]

## 2022-11-16 DIAGNOSIS — R350 Frequency of micturition: Secondary | ICD-10-CM | POA: Diagnosis not present

## 2022-11-16 DIAGNOSIS — R3912 Poor urinary stream: Secondary | ICD-10-CM | POA: Diagnosis not present

## 2022-11-25 ENCOUNTER — Ambulatory Visit (HOSPITAL_COMMUNITY): Payer: Medicare PPO | Attending: Internal Medicine

## 2022-11-25 ENCOUNTER — Other Ambulatory Visit: Payer: Self-pay | Admitting: Internal Medicine

## 2022-11-25 DIAGNOSIS — R0602 Shortness of breath: Secondary | ICD-10-CM | POA: Diagnosis not present

## 2022-11-25 DIAGNOSIS — I2510 Atherosclerotic heart disease of native coronary artery without angina pectoris: Secondary | ICD-10-CM | POA: Insufficient documentation

## 2022-11-25 LAB — ECHOCARDIOGRAM COMPLETE
Area-P 1/2: 2.87 cm2
S' Lateral: 3.6 cm

## 2022-11-28 ENCOUNTER — Other Ambulatory Visit: Payer: Self-pay | Admitting: Internal Medicine

## 2022-12-06 ENCOUNTER — Telehealth: Payer: Self-pay | Admitting: Internal Medicine

## 2022-12-06 ENCOUNTER — Other Ambulatory Visit: Payer: Self-pay | Admitting: Urology

## 2022-12-06 NOTE — Telephone Encounter (Signed)
     Pre-operative Risk Assessment    Patient Name: Dean [name].  DOB: [birth year] MRN: [id number]      Request for Surgical Clearance    Procedure:   laser vaporization of the prostate   Date of Surgery:  Clearance 01/03/23                                 Surgeon:  Dr. Junious Silk  Surgeon's Group or Practice Name:  [facility] Phone number:  [phone] Fax number:  [phone]   Type of Clearance Requested:   - Medical    Type of Anesthesia:  General    Additional requests/questions:    Signed, Selinda Orion   12/06/2022, 2:20 PM

## 2022-12-06 NOTE — Telephone Encounter (Signed)
   Name: Dean [name].  DOB: [birth year]  MRN: [id number]  Primary Cardiologist: Pixie Casino, MD   Preoperative team, please contact this patient and set up a phone call appointment for further preoperative risk assessment. Please obtain consent and complete medication review. Thank you for your help.  I confirm that guidance regarding antiplatelet and oral anticoagulation therapy has been completed and, if necessary, noted below (none requested).    Lenna Sciara, NP 12/06/2022, 3:59 PM [place]

## 2022-12-07 ENCOUNTER — Telehealth: Payer: Self-pay | Admitting: *Deleted

## 2022-12-07 ENCOUNTER — Other Ambulatory Visit: Payer: Self-pay | Admitting: Urology

## 2022-12-07 NOTE — Telephone Encounter (Signed)
I s/w the pt today and he has been scheduled for a tele pre op appt 12/15/22 @ 10:20. Pt wants to go over meds during his tele appt. Consent has been given.     Patient Consent for Virtual Visit        Dean [name]. has provided verbal consent on 12/07/2022 for a virtual visit (video or telephone).   CONSENT FOR VIRTUAL VISIT FOR:  Dean [name].  By participating in this virtual visit I agree to the following:  I hereby voluntarily request, consent and authorize [facility] and its employed or contracted physicians, physician assistants, nurse practitioners or other licensed health care professionals (the Practitioner), to provide me with telemedicine health care services (the "Services") as deemed necessary by the treating Practitioner. I acknowledge and consent to receive the Services by the Practitioner via telemedicine. I understand that the telemedicine visit will involve communicating with the Practitioner through live audiovisual communication technology and the disclosure of certain medical information by electronic transmission. I acknowledge that I have been given the opportunity to request an in-person assessment or other available alternative prior to the telemedicine visit and am voluntarily participating in the telemedicine visit.  I understand that I have the right to withhold or withdraw my consent to the use of telemedicine in the course of my care at any time, without affecting my right to future care or treatment, and that the Practitioner or I may terminate the telemedicine visit at any time. I understand that I have the right to inspect all information obtained and/or recorded in the course of the telemedicine visit and may receive copies of available information for a reasonable fee.  I understand that some of the potential risks of receiving the Services via telemedicine include:  Delay or interruption in medical evaluation due to technological  equipment failure or disruption; Information transmitted may not be sufficient (e.g. poor resolution of images) to allow for appropriate medical decision making by the Practitioner; and/or  In rare instances, security protocols could fail, causing a breach of personal health information.  Furthermore, I acknowledge that it is my responsibility to provide information about my medical history, conditions and care that is complete and accurate to the best of my ability. I acknowledge that Practitioner's advice, recommendations, and/or decision may be based on factors not within their control, such as incomplete or inaccurate data provided by me or distortions of diagnostic images or specimens that may result from electronic transmissions. I understand that the practice of medicine is not an exact science and that Practitioner makes no warranties or guarantees regarding treatment outcomes. I acknowledge that a copy of this consent can be made available to me via my patient portal ([facility]), or I can request a printed copy by calling the office of [facility].    I understand that my insurance will be billed for this visit.   I have read or had this consent read to me. I understand the contents of this consent, which adequately explains the benefits and risks of the Services being provided via telemedicine.  I have been provided ample opportunity to ask questions regarding this consent and the Services and have had my questions answered to my satisfaction. I give my informed consent for the services to be provided through the use of telemedicine in my medical care

## 2022-12-07 NOTE — Telephone Encounter (Signed)
I s/w the pt today and he has been scheduled for a tele pre op appt 12/15/22 @ 10:20. Pt wants to go over meds during his tele appt. Consent has been given.

## 2022-12-15 ENCOUNTER — Encounter: Payer: Self-pay | Admitting: Cardiology

## 2022-12-15 ENCOUNTER — Ambulatory Visit: Payer: Medicare PPO | Attending: Cardiology | Admitting: Cardiology

## 2022-12-15 DIAGNOSIS — Z01818 Encounter for other preprocedural examination: Secondary | ICD-10-CM

## 2022-12-15 NOTE — Progress Notes (Signed)
Virtual Visit via Telephone Note   Because of Dean PONDER Jr.'s co-morbid illnesses, he is at least at moderate risk for complications without adequate follow up.  This format is felt to be most appropriate for this patient at this time.  The patient did not have access to video technology/had technical difficulties with video requiring transitioning to audio format only (telephone).  All issues noted in this document were discussed and addressed.  No physical exam could be performed with this format.  Please refer to the patient's chart for his consent to telehealth for [facility].  Evaluation Performed:  Preoperative cardiovascular risk assessment _____________   Date:  12/15/2022   Patient ID:  [name], DOB 1946/12/13, MRN [id number] Patient Location:  Home Provider location:   Office  Primary Care Provider:  [name], MD Primary Cardiologist:  Pixie Casino, MD  Chief Complaint / Patient Profile   76 y.o. y/o male with a h/o CAD (stenting x 3), PAD (s/p left femorofemoral bypass), HTN, HTD, COPD, GERD, Tobacco use, OSA, who is pending laser vaporization of the prostate with Dr. Junious Silk on 01/03/23 and presents today for telephonic preoperative cardiovascular risk assessment.  History of Present Illness    Dean [name]. is a 76 y.o. male who presents via audio conferencing for a telehealth visit today.  Pt was last seen in cardiology clinic on 11/03/22 by Dr. Debara Pickett.  At that time [name] was doing relatively well.  The patient is now pending procedure as outlined above. Since his last visit, he reports that he is doing ok. He denies chest pain, palpitations, pnd, orthopnea, n, v, dizziness, syncope, weight gain, or early satiety. He endorses SOB that is at baseline for him, and pedal edema also at baseline.   Past Medical History    Past Medical History:  Diagnosis Date   AAA (abdominal aortic aneurysm) (HCC)     infrarenal aaa 3.1 x 2.8 per abd/pelvis ct 02-26-20 epic   Allergy    pollen   Anginal pain ([place]) 2005   Arthritis    hips, back, neck    Asthma    Atrial fibrillation ([place])    no documentation in epic/ pt states has never been told has A Fib   CAD (coronary artery disease)    s/p angioplasty and stenting x3 1994, pt states has this done   Cataract    small, mild   Chronic kidney disease    kidney cyst   Complication of anesthesia    woke up during gall bladder sx yrs ago and with colonscopy   Complication of anesthesia    pt thinks stopped breathing after gallbladder sx years ago, no record found   COPD (chronic obstructive pulmonary disease) (HCC)    Dyspnea    Dysrhythmia    regular irregular heart beat   GERD (gastroesophageal reflux disease)    hx of   History of hiatal hernia    History of kidney stones    History of prediabetes    none in last 8 months since 40 lb weight loss   Hyperlipidemia    Hypertension    Lower extremity edema since 02-14-2020   swelling goes down when sleeps resolved 06-29-20   Neuromuscular disorder ([place])    nerve issues with back    Peripheral vascular disease ([place])    PVC's (premature ventricular contractions)    pt denies   Sleep apnea    wears cpap  auto set 5 to 20   Tuberculosis 1966   Past Surgical History:  Procedure Laterality Date   APPENDECTOMY     CHOLECYSTECTOMY  1990s   Gall Bladder   COLONOSCOPY     CORONARY ANGIOPLASTY WITH STENT PLACEMENT  1994   CYSTOSCOPY WITH INSERTION OF UROLIFT N/A 04/14/2020   Procedure: CYSTOSCOPY WITH INSERTION OF UROLIFT;  Surgeon: Festus Aloe, MD;  Location: [facility];  Service: Urology;  Laterality: N/A;   CYSTOSCOPY WITH STENT PLACEMENT Right 07/07/2020   Procedure: CYSTOSCOPY WITH STENT CHANGE;  Surgeon: Festus Aloe, MD;  Location: [facility];  Service: Urology;  Laterality: Right;   CYSTOSCOPY WITH URETEROSCOPY AND STENT PLACEMENT Bilateral  01/03/2020   Procedure: CYSTOSCOPY WITH RETROGRADE/ URETEROSCOPY AND RIGHT STENT PLACEMENT BLADDER BIOPSY;  Surgeon: Festus Aloe, MD;  Location: [facility];  Service: Urology;  Laterality: Bilateral;  ONLY NEEDS 30 MIN   CYSTOSCOPY WITH URETEROSCOPY AND STENT PLACEMENT Right 04/14/2020   Procedure: CYSTOSCOPY WITH URETEROSCOPY, RETROGRADE  AND STENT REPLACEMENT;  Surgeon: Festus Aloe, MD;  Location: [facility];  Service: Urology;  Laterality: Right;   EYE SURGERY Bilateral 2019   cataracts   lumbar disectomy  2005   L3 - 4 - 5   POLYPECTOMY     PR VEIN BYPASS GRAFT,AORTO-FEM-POP  08/31/11   Right to Left Fem-Fem   RENAL ANGIOGRAPHY N/A 02/14/2020   Procedure: RENAL ANGIOGRAPHY;  Surgeon: Elam Dutch, MD;  Location: [facility];  Service: Cardiovascular;  Laterality: N/A;  Bilateral   ROBOT ASSISTED PYELOPLASTY Right 11/20/2020   Procedure: XI ROBOTIC RIGHT ASSISTED PYELOPLASTY AND STENT PLACEMENT;  Surgeon: Ardis Hughs, MD;  Location: [facility];  Service: Urology;  Laterality: Right;   TONSILLECTOMY  as child   TONSILLECTOMY     TOTAL HIP ARTHROPLASTY Right 07/15/2020   Procedure: TOTAL HIP ARTHROPLASTY ANTERIOR APPROACH;  Surgeon: Gaynelle Arabian, MD;  Location: [facility];  Service: Orthopedics;  Laterality: Right;  [id number]   UMBILICAL HERNIA REPAIR     x 2    Allergies  Allergies  Allergen Reactions   Penicillins     Childhood reaction  REACTION: rapid pulse Did it involve swelling of the face/tongue/throat, SOB, or low BP? Yes Did it involve sudden or severe rash/hives, skin peeling, or any reaction on the inside of your mouth or nose? Unknown Did you need to seek medical attention at a hospital or doctor's office? yes When did it last happen?      childhood  If all above answers are "NO", may proceed with cephalosporin use.    Fish Oil     Other reaction(s): Heart attack symptoms   Other     Oily seafood salmon-- chest  pain   Penicillin G     Other reaction(s): Unknown   Mold Extract [Trichophyton] Rash    Home Medications    Prior to Admission medications   Medication Sig Start Date End Date Taking? Authorizing Provider  albuterol (PROVENTIL) (2.5 MG/3ML) 0.083% nebulizer solution USE 1 VIAL VIA NEBULIZER EVERY 6 HOURS AS NEEDED FOR WHEEZING OR SHORTNESS OF BREATH Patient taking differently: Take 2.5 mg by nebulization every 6 (six) hours as needed for wheezing or shortness of breath. 07/03/19   Deneise Lever, MD  albuterol (VENTOLIN HFA) 108 (90 Base) MCG/ACT inhaler INHALE 1 PUFF INTO THE LUNGS TWICE DAILY 09/12/22   Baird Lyons D, MD  antiseptic oral rinse (BIOTENE) LIQD 15 mLs by Mouth  Rinse route daily as needed for dry mouth.     [provider]  aspirin 325 MG tablet 1 tablet Orally Once a day    [provider]  atorvastatin (LIPITOR) 10 MG tablet Take 1 tablet by mouth daily.    [provider]  Azelastine-Fluticasone 137-50 MCG/ACT SUSP Place 1-2 sprays into both nostrils at bedtime. 09/12/22   Deneise Lever, MD  diltiazem (CARDIZEM CD) 120 MG 24 hr capsule Take by mouth. 05/06/22   [provider]  diltiazem (DILT-XR) 120 MG 24 hr capsule Take 1 capsule (120 mg total) by mouth daily. 11/25/22   Hilty, Nadean Corwin, MD  docusate sodium (COLACE) 100 MG capsule Take 100 mg by mouth daily.    [provider]  doxycycline (VIBRAMYCIN) 100 MG capsule Take 1 capsule by mouth 2 (two) times daily.    [provider]  esomeprazole (NEXIUM) 40 MG capsule Take 40 mg by mouth daily as needed (acid reflux/indigestion.).  02/07/19   [provider]  finasteride (PROSCAR) 5 MG tablet Take 1 tablet by mouth daily.    [provider]  furosemide (LASIX) 20 MG tablet Take 1 tablet (20 mg total) by mouth daily as needed. 06/08/22   Lenna Sciara, NP  hydroxypropyl methylcellulose / hypromellose (GONAK) 2.5 % ophthalmic solution 1 [drp] by  ophthalmic route.    [provider]  irbesartan (AVAPRO) 150 MG tablet Take 150 mg by mouth every evening.  11/11/19   [provider]  isosorbide mononitrate (IMDUR) 60 MG 24 hr tablet Take 60 mg by mouth every evening.    [provider]  loperamide (IMODIUM A-D) 2 MG tablet 2 mg by oral route.    [provider]  MAGNESIUM-OXIDE 400 (240 Mg) MG tablet 1 tablet with food Orally every other day for 90 days    [provider]  Menthol, Topical Analgesic, (BLUE-EMU MAXIMUM STRENGTH EX) Apply 1 application topically 3 (three) times daily as needed (pain.).    [provider]  methocarbamol (ROBAXIN) 500 MG tablet 500 mg by oral route. 07/16/20   [provider]  mupirocin ointment (BACTROBAN) 2 % SMARTSIG:sparingly Topical Twice Daily 03/08/22   [provider]  Na Sulfate-K Sulfate-Mg Sulf 17.5-3.13-1.6 GM/177ML SOLN SMARTSIG:1 Kit(s) By Mouth Once 04/22/22   [provider]  nitrofurantoin (MACRODANTIN) 50 MG capsule 50 mg by oral route. 07/07/20   [provider]  Polyethyl Glycol-Propyl Glycol (SYSTANE ULTRA OP) Place 1 drop into both eyes daily as needed (Dry eye).    [provider]  potassium chloride (KLOR-CON M) 10 MEQ tablet Take 1 tablet on the days Lasix is taken. 06/08/22   Lenna Sciara, NP  simethicone (MYLICON) 790 MG chewable tablet Chew 125 mg by mouth every 6 (six) hours as needed for flatulence. Takes gas x    [provider]  tamsulosin (FLOMAX) 0.4 MG CAPS capsule Take 1 capsule by mouth at bedtime.    [provider]  Tiotropium Bromide-Olodaterol (STIOLTO RESPIMAT) 2.5-2.5 MCG/ACT AERS Inhale 2 puffs into the lungs daily. 01/11/22   Deneise Lever, MD  traMADol (ULTRAM) 50 MG tablet Take 1 tablet by mouth 4 (four) times daily as needed. 03/21/22   [provider]    Physical Exam    Vital Signs:  [name] does not have vital signs available  for review today.  Given telephonic nature of communication, physical exam is limited. AAOx3. NAD. Normal affect.  Speech  and respirations are unlabored.  Accessory Clinical Findings    None  Assessment & Plan    1.  Preoperative Cardiovascular Risk Assessment:  According to the Revised Cardiac Risk Index (RCRI), his Perioperative Risk of Major Cardiac Event is (%): 0.9  His Functional Capacity in METs is: 4.7 according to the Duke Activity Status Index (DASI).  Therefore, based on ACC/AHA guidelines, patient would be at acceptable risk for the planned procedure without further cardiovascular testing.   The patient was advised that if he develops new symptoms prior to surgery to contact our office to arrange for a follow-up visit, and he verbalized understanding.  Regarding ASA therapy, we recommend continuation of ASA throughout the perioperative period.  However, if the surgeon feels that cessation of ASA is required in the perioperative period, it may be stopped 5-7 days prior to surgery with a plan to resume it as soon as felt to be feasible from a surgical standpoint in the post-operative period.    A copy of this note will be routed to requesting surgeon.  Time:   Today, I have spent 14 minutes with the patient with telehealth technology discussing medical history, symptoms, and management plan.     Trudi Ida, NP  12/15/2022, 10:37 AM

## 2022-12-30 ENCOUNTER — Encounter (HOSPITAL_BASED_OUTPATIENT_CLINIC_OR_DEPARTMENT_OTHER): Payer: Self-pay | Admitting: Urology

## 2022-12-30 NOTE — Progress Notes (Signed)
Spoke w/ via phone for pre-op interview--- pt Lab needs dos----   [employer] results------ current EKG in epic/ chart COVID test -----patient states asymptomatic no test needed Arrive at -------  0530 on 01-03-2023 NPO after MN NO Solid Food.  Clear liquids from MN until--- 0430 Med rec completed Medications to take morning of surgery ----- cardizem, nexium, stiolto inhaler/ alb. Inhaler,  do alb. Nebulizer night before Diabetic medication ----- n/a Patient instructed no nail polish to be worn day of surgery Patient instructed to bring photo id and insurance card day of surgery Patient aware to have Driver (ride ) / caregiver    for 24 hours after surgery -- brother, hubert Patient Special Instructions ----- asked to bring both inhaler;s dos Pre-Op special Istructions -----  pt has cardiac telephone visit clearance by Venia Carbon NP on 12-15-2022 in epic/ chart Patient verbalized understanding of instructions that were given at this phone interview. Patient denies shortness of breath, chest pain, fever, cough at this phone interview.   Anesthesia Review:  HTN;  CAD s/p PCI x3 stents in 1994;  PAD s/p bilateral CIA stenting 1990s and 09/ 2012 right to left fem-fem bypass;  renal artery stenosis/ AAA (3.4cm);  COPD mixed type;  OSA pt stated used cpap nightly;  CKD 3 Pt denies cardiac s&s.  Per pt SOB at baseline and peripheral swelling at baseline no changes since spoke w/ cardiology 12-15-2022.  Stated last used nebulizer several months ago and stated uses rescue inhaler daily  PCP: Dr Reynaldo Minium Cardiologist : Dr Debara Pickett  [facility] 10-24-2022) Vascular:  Dr Scot Dock  [facility] 03-24-2022) Pulmonologist:  Dr C. Young  (lov 06-30-2022) Chest x-ray : 06-30-2022 EKG : 10-24-2022 Echo : 11-25-2022 Stress test: nuclear 10-15-2020 Cardiac Cath :  1994  (not available) Activity level:  sob w/ exertion due to COPD Sleep Study/ CPAP :  Yes/ Yes  Blood Thinner/ Instructions /Last Dose: no ASA /  Instructions/ Last Dose : ASA '325mg'$ /  pt stated was given instructions by cardiology may stop prior to surgery, last dose 12-29-2022

## 2023-01-03 ENCOUNTER — Encounter (HOSPITAL_BASED_OUTPATIENT_CLINIC_OR_DEPARTMENT_OTHER)
Admission: RE | Disposition: A | Discharge status: Home / Self Care | Payer: Self-pay | Source: Home / Self Care | Attending: Urology

## 2023-01-03 ENCOUNTER — Ambulatory Visit (HOSPITAL_BASED_OUTPATIENT_CLINIC_OR_DEPARTMENT_OTHER)
Admission: RE | Admit: 2023-01-03 | Discharge: 2023-01-03 | Disposition: A | Discharge status: Home / Self Care | Payer: Medicare PPO | Attending: Urology | Admitting: Urology

## 2023-01-03 ENCOUNTER — Ambulatory Visit (HOSPITAL_BASED_OUTPATIENT_CLINIC_OR_DEPARTMENT_OTHER): Payer: Medicare PPO | Admitting: Anesthesiology

## 2023-01-03 ENCOUNTER — Other Ambulatory Visit: Payer: Self-pay

## 2023-01-03 ENCOUNTER — Encounter (HOSPITAL_BASED_OUTPATIENT_CLINIC_OR_DEPARTMENT_OTHER): Payer: Self-pay | Admitting: Urology

## 2023-01-03 DIAGNOSIS — E785 Hyperlipidemia, unspecified: Secondary | ICD-10-CM | POA: Diagnosis not present

## 2023-01-03 DIAGNOSIS — F17210 Nicotine dependence, cigarettes, uncomplicated: Secondary | ICD-10-CM

## 2023-01-03 DIAGNOSIS — Z87442 Personal history of urinary calculi: Secondary | ICD-10-CM | POA: Diagnosis not present

## 2023-01-03 DIAGNOSIS — N401 Enlarged prostate with lower urinary tract symptoms: Secondary | ICD-10-CM | POA: Insufficient documentation

## 2023-01-03 DIAGNOSIS — Z01818 Encounter for other preprocedural examination: Secondary | ICD-10-CM

## 2023-01-03 DIAGNOSIS — J449 Chronic obstructive pulmonary disease, unspecified: Secondary | ICD-10-CM | POA: Diagnosis not present

## 2023-01-03 DIAGNOSIS — G4733 Obstructive sleep apnea (adult) (pediatric): Secondary | ICD-10-CM | POA: Insufficient documentation

## 2023-01-03 DIAGNOSIS — R3912 Poor urinary stream: Secondary | ICD-10-CM | POA: Diagnosis not present

## 2023-01-03 DIAGNOSIS — I2510 Atherosclerotic heart disease of native coronary artery without angina pectoris: Secondary | ICD-10-CM | POA: Diagnosis not present

## 2023-01-03 DIAGNOSIS — R3915 Urgency of urination: Secondary | ICD-10-CM

## 2023-01-03 DIAGNOSIS — N183 Chronic kidney disease, stage 3 unspecified: Secondary | ICD-10-CM | POA: Insufficient documentation

## 2023-01-03 DIAGNOSIS — I739 Peripheral vascular disease, unspecified: Secondary | ICD-10-CM | POA: Insufficient documentation

## 2023-01-03 DIAGNOSIS — Z955 Presence of coronary angioplasty implant and graft: Secondary | ICD-10-CM | POA: Diagnosis not present

## 2023-01-03 DIAGNOSIS — I129 Hypertensive chronic kidney disease with stage 1 through stage 4 chronic kidney disease, or unspecified chronic kidney disease: Secondary | ICD-10-CM | POA: Insufficient documentation

## 2023-01-03 DIAGNOSIS — Z79899 Other long term (current) drug therapy: Secondary | ICD-10-CM | POA: Insufficient documentation

## 2023-01-03 DIAGNOSIS — K219 Gastro-esophageal reflux disease without esophagitis: Secondary | ICD-10-CM | POA: Diagnosis not present

## 2023-01-03 DIAGNOSIS — N400 Enlarged prostate without lower urinary tract symptoms: Secondary | ICD-10-CM | POA: Diagnosis not present

## 2023-01-03 DIAGNOSIS — F17200 Nicotine dependence, unspecified, uncomplicated: Secondary | ICD-10-CM | POA: Diagnosis not present

## 2023-01-03 DIAGNOSIS — N138 Other obstructive and reflux uropathy: Secondary | ICD-10-CM

## 2023-01-03 HISTORY — DX: Diaphragmatic hernia without obstruction or gangrene: K44.9

## 2023-01-03 HISTORY — DX: Diverticulosis of large intestine without perforation or abscess without bleeding: K57.30

## 2023-01-03 HISTORY — DX: Chronic kidney disease, stage 3 unspecified: N18.30

## 2023-01-03 HISTORY — DX: Peripheral vascular disease, unspecified: I73.9

## 2023-01-03 HISTORY — DX: Chronic obstructive pulmonary disease, unspecified: J44.9

## 2023-01-03 HISTORY — DX: Localized edema: R60.0

## 2023-01-03 HISTORY — DX: Personal history of adenomatous and serrated colon polyps: Z86.0101

## 2023-01-03 HISTORY — DX: Other diseases of salivary glands: K11.8

## 2023-01-03 HISTORY — DX: Atherosclerosis of renal artery: I70.1

## 2023-01-03 HISTORY — DX: Other cervical disc degeneration, unspecified cervical region: M50.30

## 2023-01-03 HISTORY — DX: Personal history of malignant melanoma of skin: Z85.820

## 2023-01-03 HISTORY — DX: Benign prostatic hyperplasia with lower urinary tract symptoms: N40.1

## 2023-01-03 HISTORY — DX: Personal history of colonic polyps: Z86.010

## 2023-01-03 LAB — POCT I-STAT, CHEM 8
BUN: 23 mg/dL (ref 8–23)
Calcium, Ion: 0.88 mmol/L — CL (ref 1.15–1.40)
Chloride: 105 mmol/L (ref 98–111)
Creatinine, Ser: 1.7 mg/dL — ABNORMAL HIGH (ref 0.61–1.24)
Glucose, Bld: 98 mg/dL (ref 70–99)
HCT: 50 % (ref 39.0–52.0)
Hemoglobin: 17 g/dL (ref 13.0–17.0)
Potassium: 3.8 mmol/L (ref 3.5–5.1)
Sodium: 138 mmol/L (ref 135–145)
TCO2: 26 mmol/L (ref 22–32)

## 2023-01-03 SURGERY — Transurethral resection of prostate (TURP) with Rezume
Anesthesia: General

## 2023-01-03 MED ORDER — PHENYLEPHRINE HCL (PRESSORS) 10 MG/ML IV SOLN
INTRAVENOUS | Status: DC | PRN
  Administered 2023-01-03: 80 ug via INTRAVENOUS

## 2023-01-03 MED ORDER — FENTANYL CITRATE (PF) 100 MCG/2ML IJ SOLN
INTRAMUSCULAR | Status: AC
  Filled 2023-01-03: qty 2

## 2023-01-03 MED ORDER — DEXAMETHASONE SODIUM PHOSPHATE 4 MG/ML IJ SOLN
INTRAMUSCULAR | Status: DC | PRN
  Administered 2023-01-03: 5 mg via INTRAVENOUS

## 2023-01-03 MED ORDER — SODIUM CHLORIDE 0.9 % IR SOLN
Status: DC | PRN
  Administered 2023-01-03 (×4): 3000 mL

## 2023-01-03 MED ORDER — CEFAZOLIN SODIUM-DEXTROSE 2-4 GM/100ML-% IV SOLN
INTRAVENOUS | Status: AC
  Filled 2023-01-03: qty 100

## 2023-01-03 MED ORDER — ASPIRIN 325 MG PO TABS: 325.0000 mg | ORAL_TABLET | Freq: Every day | ORAL | Status: DC

## 2023-01-03 MED ORDER — CIPROFLOXACIN HCL 250 MG PO TABS: 250.0000 mg | ORAL_TABLET | Freq: Every day | ORAL | 0 refills | Status: DC

## 2023-01-03 MED ORDER — FENTANYL CITRATE (PF) 100 MCG/2ML IJ SOLN
INTRAMUSCULAR | Status: DC | PRN
  Administered 2023-01-03: 50 ug via INTRAVENOUS

## 2023-01-03 MED ORDER — LIDOCAINE HCL (CARDIAC) PF 100 MG/5ML IV SOSY
PREFILLED_SYRINGE | INTRAVENOUS | Status: DC | PRN
  Administered 2023-01-03: 50 mg via INTRAVENOUS

## 2023-01-03 MED ORDER — OXYCODONE HCL 5 MG/5ML PO SOLN: 5.0000 mg | Freq: Once | ORAL | Status: DC | PRN

## 2023-01-03 MED ORDER — SODIUM CHLORIDE 0.9 % IV SOLN
INTRAVENOUS | Status: DC
  Administered 2023-01-03: 1000 mL via INTRAVENOUS

## 2023-01-03 MED ORDER — PROPOFOL 10 MG/ML IV BOLUS
INTRAVENOUS | Status: DC | PRN
  Administered 2023-01-03: 150 mg via INTRAVENOUS

## 2023-01-03 MED ORDER — ONDANSETRON HCL 4 MG/2ML IJ SOLN: 4.0000 mg | Freq: Once | INTRAMUSCULAR | Status: DC | PRN

## 2023-01-03 MED ORDER — ONDANSETRON HCL 4 MG/2ML IJ SOLN
INTRAMUSCULAR | Status: DC | PRN
  Administered 2023-01-03: 4 mg via INTRAVENOUS

## 2023-01-03 MED ORDER — FENTANYL CITRATE (PF) 100 MCG/2ML IJ SOLN: 25.0000 ug | INTRAMUSCULAR | Status: DC | PRN

## 2023-01-03 MED ORDER — CEFAZOLIN SODIUM-DEXTROSE 2-3 GM-%(50ML) IV SOLR
INTRAVENOUS | Status: DC | PRN
  Administered 2023-01-03: 2 g via INTRAVENOUS

## 2023-01-03 MED ORDER — EPHEDRINE SULFATE (PRESSORS) 50 MG/ML IJ SOLN
INTRAMUSCULAR | Status: DC | PRN
  Administered 2023-01-03: 5 mg via INTRAVENOUS

## 2023-01-03 MED ORDER — OXYCODONE HCL 5 MG PO TABS: 5.0000 mg | ORAL_TABLET | Freq: Once | ORAL | Status: DC | PRN

## 2023-01-03 MED ORDER — ACETAMINOPHEN 10 MG/ML IV SOLN: 1000.0000 mg | Freq: Once | INTRAVENOUS | Status: DC | PRN

## 2023-01-03 SURGICAL SUPPLY — 32 items
BAG DRAIN URO-CYSTO SKYTR STRL (DRAIN) ×2 IMPLANT
BAG DRN RND TRDRP ANRFLXCHMBR (UROLOGICAL SUPPLIES) ×1
BAG DRN UROCATH (DRAIN) ×1
BAG URINE DRAIN 2000ML AR STRL (UROLOGICAL SUPPLIES) ×2 IMPLANT
CATH COUDE FOLEY 2W 5CC 18FR (CATHETERS) IMPLANT
CATH FOLEY 2WAY SLVR  5CC 18FR (CATHETERS)
CATH FOLEY 2WAY SLVR  5CC 20FR (CATHETERS) ×1
CATH FOLEY 2WAY SLVR 5CC 18FR (CATHETERS) IMPLANT
CATH FOLEY 2WAY SLVR 5CC 20FR (CATHETERS) IMPLANT
CATH FOLEY 3WAY 30CC 22FR (CATHETERS) IMPLANT
CLOTH BEACON ORANGE TIMEOUT ST (SAFETY) ×2 IMPLANT
ELECT BIVAP BIPO 22/24 DONUT (ELECTROSURGICAL)
ELECTRD BIVAP BIPO 22/24 DONUT (ELECTROSURGICAL) IMPLANT
GLOVE BIO SURGEON STRL SZ7.5 (GLOVE) ×2 IMPLANT
GLOVE BIO SURGEON STRL SZ8 (GLOVE) IMPLANT
GOWN STRL REUS W/TWL LRG LVL3 (GOWN DISPOSABLE) ×2 IMPLANT
HOLDER FOLEY CATH W/STRAP (MISCELLANEOUS) IMPLANT
IV NS 1000ML (IV SOLUTION)
IV NS 1000ML BAXH (IV SOLUTION) ×2 IMPLANT
IV NS IRRIG 3000ML ARTHROMATIC (IV SOLUTION) ×2 IMPLANT
IV SET EXTENSION GRAVITY 40 LF (IV SETS) ×2 IMPLANT
KIT TURNOVER CYSTO (KITS) ×2 IMPLANT
LASER REVOLIX HI ENERGY 1000 (Laser) ×2 IMPLANT
LASER REVOLIX PROCEDURE (MISCELLANEOUS) ×2 IMPLANT
LOOP CUT BIPOLAR 24F LRG (ELECTROSURGICAL) IMPLANT
MANIFOLD NEPTUNE II (INSTRUMENTS) IMPLANT
PACK CYSTO (CUSTOM PROCEDURE TRAY) ×2 IMPLANT
SYR 30ML LL (SYRINGE) IMPLANT
SYR TOOMEY IRRIG 70ML (MISCELLANEOUS) ×1
SYRINGE TOOMEY IRRIG 70ML (MISCELLANEOUS) IMPLANT
TUBE CONNECTING 12X1/4 (SUCTIONS) IMPLANT
TUBING UROLOGY SET (TUBING) IMPLANT

## 2023-01-03 NOTE — Op Note (Signed)
Pre-operative Diagnosis: BPH, weak stream, urgency  Post-operative Diagnosis: Same  Procedure: Transurethral resection of prostate  Surgeon: Junious Silk  Anesthesia: General  Indication for procedure: Wynetta Emery is a 77 year old male with progressive BPH.  He had a prior UroLift as well as taking alpha-blocker and 5 alpha reductase inhibitor.  Findings: On cystoscopy the urethra was unremarkable, bladder obstructed by trilobar hypertrophy with a small median lobe, high bladder neck and lateral lobes.  Moderate bladder trabeculation.  No stone or foreign body in the bladder.  No mucosal lesion.  Trigone and ureteral orifice ease appeared normal.  On exam under anesthesia the penis was circumcised without mass or lesion.  Glans and meatus appeared normal.  Testicles descended bilaterally and palpably normal.  On DRE prostate was about 50 g and smooth without hard area or nodule.  Description of procedure: After consent was obtained patient brought to the operating room.  After adequate anesthesia he is placed in lithotomy position and prepped and draped in the usual sterile fashion.  Timeout was performed for the patient and procedure.  Cystoscope was passed per urethra and the bladder carefully inspected.  The laser machine was not working and it was felt patient was acceptable risk for TURP.  He has been on finasteride and did not have a significantly enlarged prostate.  Therefore the cystoscope was removed and the continuous-flow sheath passed with the visual obturator and then the loop and handle.  Starting at patient's left usual 5:00 incision made down to the bladder neck and prostate and brought this to the verumontanum.  Of course this was after identifying the ureteral orifice ease.  Similar incision at 7:00 and then the median lobe was resected.  I then turned my attention to the left lateral lobe and went up anteriorly and made an incision down to to the bladder neck.  2 prostatic urethral lift  clips (urethral end piece) were resected and drained.  Having to find the lateral lobe this was resected anterior to posterior bladder neck down to the verumontanum.  Similar resection on the right side.  1 UroLift clip was noted on this side.  All the chips were drained.  Hemostasis was ensured.  Some residual right apical tissue and residual left proximal tissue was resected.  This created an excellent channel.  All the chips were evacuated.  Hemostasis excellent at low pressure.  The scope was backed out and a 20 [place] two-way catheter was placed.  Irrigation was clear.  Exam under anesthesia performed.  He is awakened taken recovery room in stable condition.  Complications: None  Blood loss: 20 mL  Specimens: TURP chips  Drains: 20 [place] two-way catheter  Disposition: Patient stable to PACU-he will follow-up in 3 days for voiding trial.

## 2023-01-03 NOTE — Anesthesia Postprocedure Evaluation (Signed)
Anesthesia Post Note  Patient: Dean [name].  Procedure(s) Performed: THULIUM LASER TURP (TRANSURETHRAL RESECTION OF PROSTATE) POSSIBLE TRANSUSRETHRAL RESECTION OF PROSTATE     Patient location during evaluation: PACU Anesthesia Type: General Level of consciousness: awake and alert Pain management: pain level controlled Vital Signs Assessment: post-procedure vital signs reviewed and stable Respiratory status: spontaneous breathing, nonlabored ventilation, respiratory function stable and patient connected to nasal cannula oxygen Cardiovascular status: blood pressure returned to baseline and stable Postop Assessment: no apparent nausea or vomiting Anesthetic complications: no  No notable events documented.  Last Vitals:  Vitals:   01/03/23 0604 01/03/23 0848  BP: (!) 185/89 (!) 171/84  Pulse: 68 (!) 56  Resp: 17 14  Temp: 36.7 C 36.6 C  SpO2: 94% 100%    Last Pain:  Vitals:   01/03/23 0630  TempSrc:   PainSc: 0-No pain                 Dossie Swor S

## 2023-01-03 NOTE — Anesthesia Preprocedure Evaluation (Signed)
Anesthesia Evaluation  Patient identified by MRN, date of birth, ID band Patient awake    Reviewed: Allergy & Precautions, H&P , NPO status , Patient's Chart, lab work & pertinent test results  Airway Mallampati: II  TM Distance: >3 FB Neck ROM: Full    Dental no notable dental hx.    Pulmonary sleep apnea and Continuous Positive Airway Pressure Ventilation , COPD, Current Smoker   breath sounds clear to auscultation + decreased breath sounds      Cardiovascular hypertension, + CAD, + Cardiac Stents and + Peripheral Vascular Disease  Normal cardiovascular exam Rhythm:Regular Rate:Normal     Neuro/Psych negative neurological ROS  negative psych ROS   GI/Hepatic Neg liver ROS,GERD  ,,  Endo/Other  negative endocrine ROS    Renal/GU Renal InsufficiencyRenal disease  negative genitourinary   Musculoskeletal negative musculoskeletal ROS (+)    Abdominal   Peds negative pediatric ROS (+)  Hematology negative hematology ROS (+)   Anesthesia Other Findings   Reproductive/Obstetrics negative OB ROS                             Anesthesia Physical Anesthesia Plan  ASA: 3  Anesthesia Plan: General   Post-op Pain Management: Minimal or no pain anticipated   Induction: Intravenous  PONV Risk Score and Plan: 1 and Ondansetron, Dexamethasone and Treatment may vary due to age or medical condition  Airway Management Planned: LMA  Additional Equipment:   Intra-op Plan:   Post-operative Plan: Extubation in OR  Informed Consent: I have reviewed the patients History and Physical, chart, labs and discussed the procedure including the risks, benefits and alternatives for the proposed anesthesia with the patient or authorized representative who has indicated his/her understanding and acceptance.     Dental advisory given  Plan Discussed with: CRNA and Surgeon  Anesthesia Plan Comments:         Anesthesia Quick Evaluation

## 2023-01-03 NOTE — Transfer of Care (Signed)
Immediate Anesthesia Transfer of Care Note  Patient: Dean [name].  Procedure(s) Performed: THULIUM LASER TURP (TRANSURETHRAL RESECTION OF PROSTATE) POSSIBLE TRANSUSRETHRAL RESECTION OF PROSTATE  Patient Location: PACU  Anesthesia Type:General  Level of Consciousness: awake and patient cooperative  Airway & Oxygen Therapy: Patient Spontanous Breathing and Patient connected to nasal cannula oxygen  Post-op Assessment: Report given to RN and Post -op Vital signs reviewed and stable  Post vital signs: Reviewed and stable  Last Vitals:  Vitals Value Taken Time  BP    Temp    Pulse 56 01/03/23 0847  Resp    SpO2 100 % 01/03/23 0847  Vitals shown include unvalidated device data.  Last Pain:  Vitals:   01/03/23 0630  TempSrc:   PainSc: 0-No pain      Patients Stated Pain Goal: 5 (34/28/76 8115)  Complications: No notable events documented.

## 2023-01-03 NOTE — Anesthesia Procedure Notes (Signed)
Procedure Name: LMA Insertion Date/Time: 01/03/2023 7:44 AM  Performed by: Georgeanne Nim, CRNAPre-anesthesia Checklist: Patient identified, Emergency Drugs available, Suction available, Patient being monitored and Timeout performed Patient Re-evaluated:Patient Re-evaluated prior to induction Oxygen Delivery Method: Circle system utilized Preoxygenation: Pre-oxygenation with 100% oxygen Induction Type: IV induction Ventilation: Mask ventilation without difficulty LMA: LMA with gastric port inserted LMA Size: 4.0 Number of attempts: 1 Placement Confirmation: positive ETCO2 and breath sounds checked- equal and bilateral Tube secured with: Tape Dental Injury: Teeth and Oropharynx as per pre-operative assessment

## 2023-01-03 NOTE — Discharge Instructions (Signed)
  Post Anesthesia Home Care Instructions ? ?Activity: ?Get plenty of rest for the remainder of the day. A responsible individual must stay with you for 24 hours following the procedure.  ?For the next 24 hours, DO NOT: ?-Drive a car ?-Operate machinery ?-Drink alcoholic beverages ?-Take any medication unless instructed by your physician ?-Make any legal decisions or sign important papers. ? ?Meals: ?Start with liquid foods such as gelatin or soup. Progress to regular foods as tolerated. Avoid greasy, spicy, heavy foods. If nausea and/or vomiting occur, drink only clear liquids until the nausea and/or vomiting subsides. Call your physician if vomiting continues. ? ?Special Instructions/Symptoms: ?Your throat may feel dry or sore from the anesthesia or the breathing tube placed in your throat during surgery. If this causes discomfort, gargle with warm salt water. The discomfort should disappear within 24 hours. ? ? ?    ?

## 2023-01-03 NOTE — H&P (Signed)
H&P  Chief Complaint: BPH with lower urinary tract symptoms  History of Present Illness: Dean [name] is a 77 year old male with a history of BPH on tamsulosin and finasteride.  He has progressive and bothersome lower urinary tract symptoms.  He has a history of urinary retention and underwent prostatic urethral lift in 2021.  PUL provided some relief but his lower urinary tract symptoms have progressed over the years with a slow stream and urgency.  On cystoscopy I continued bladder outlet obstruction with lateral lobe hypertrophy and a small median lobe.  Recent PSA was 2/4 in October 2023.  He presents today for thulium laser vaporization of the prostate possible TURP and as well.  He had some sinus drainage but no significant cough or congestion.  No fever.  No dysuria or gross hematuria.  He has some baseline pedal edema and shortness of breath which is stable.  He did have cardiology preop.  Past Medical History:  Diagnosis Date   AAA (abdominal aortic aneurysm) ([place]) 2021   followed by dr Krystal Clark;   incidental finding infrarenal aaa 3.1 x 2.8 per abd/pelvis ct 02-26-20 epic;   last CT  03-10-2022  3.4cm   Arthritis    hips, back, neck    Benign localized prostatic hyperplasia with lower urinary tract symptoms (LUTS)    urologist--- dr Junious Silk   CAD (coronary artery disease) 1994   cardiologist---  dr Debara Pickett;   s/p angioplasty and stenting x3 1994;   nuclear stress test 10-15-2020  low risk, no ischemia, nuclear ef 59%   Chronic stable angina 2005   CKD (chronic kidney disease), stage III ([place])    Complication of anesthesia    awareness during anesthesia;  w/ cholestectomy in 1990s pt stated thinks he stopped breathing   COPD mixed type ([place])    pulmolonogist--- dr c. young   DDD (degenerative disc disease), cervical    Diverticulosis of colon    Dyspnea    chronic   Edema of both lower extremities    GERD (gastroesophageal reflux disease)    hx of   Hiatal hernia    History of  adenomatous polyp of colon    History of kidney stones    History of lower GI bleeding 02/2022   admission in epic;   diverticular bleed , no transfusions   History of melanoma    per pt excision forehead , localized   History of prediabetes    History of tuberculosis 1966   Hyperlipidemia    Hypertension    Neuromuscular disorder ([place])    nerve issues with back    OSA on CPAP 2000   followed by dr c. young;   sleep study in epic , severe osa;   wears cpap auto set 5 to 20   PAD (peripheral artery disease) ([place])    vascular--- dr Scot Dock;   hx bilateral CIA stenting in 1990s;  left occluded, dr Kellie Simmering s/p  right to left fem-fem bypass 09/ 2012,  renal artery stenosis and aaa   Parotid mass    followed by dr Constance Holster  (ENT);  per pt stable   Peripheral vascular disease ([place])    PVC's (premature ventricular contractions)    Renal artery stenosis ([place])    02-14-2020  renal angiogram left > right renal stenosis   S/P primary angioplasty with coronary stent 1994   Past Surgical History:  Procedure Laterality Date   APPENDECTOMY  1974   CATARACT EXTRACTION W/ INTRAOCULAR LENS IMPLANT Bilateral 2019  CHOLECYSTECTOMY, LAPAROSCOPIC     1990s   COLONOSCOPY WITH PROPOFOL  04/27/2022   dr Henrene Pastor   CORONARY ANGIOPLASTY WITH STENT PLACEMENT  1994   CYSTOSCOPY WITH INSERTION OF UROLIFT N/A 04/14/2020   Procedure: CYSTOSCOPY WITH INSERTION OF UROLIFT;  Surgeon: Festus Aloe, MD;  Location: [facility];  Service: Urology;  Laterality: N/A;   CYSTOSCOPY WITH STENT PLACEMENT Right 07/07/2020   Procedure: CYSTOSCOPY WITH STENT CHANGE;  Surgeon: Festus Aloe, MD;  Location: [facility];  Service: Urology;  Laterality: Right;   CYSTOSCOPY WITH URETEROSCOPY AND STENT PLACEMENT Bilateral 01/03/2020   Procedure: CYSTOSCOPY WITH RETROGRADE/ URETEROSCOPY AND RIGHT STENT PLACEMENT BLADDER BIOPSY;  Surgeon: Festus Aloe, MD;  Location: [facility];   Service: Urology;  Laterality: Bilateral;  ONLY NEEDS 30 MIN   CYSTOSCOPY WITH URETEROSCOPY AND STENT PLACEMENT Right 04/14/2020   Procedure: CYSTOSCOPY WITH URETEROSCOPY, RETROGRADE  AND STENT REPLACEMENT;  Surgeon: Festus Aloe, MD;  Location: [facility];  Service: Urology;  Laterality: Right;   CYSTOSCOPY/URETEROSCOPY/HOLMIUM LASER/STENT PLACEMENT  05/18/2005   '@[facility]'$  by dr Threasa Heads BYPASS GRAFT  08/31/2011   '@[facility]'$  by dr Kellie Simmering;  right to left   ILIAC ARTERY STENT Bilateral    1990s;   bilateral common iliac artery stenting   LAPAROSCOPY ABDOMEN DIAGNOSTIC  12/03/2008   '@[facility]'$  by dr Donne Hazel;   Lap. lysis adhesions/  open umbilical hernia repair   LUMBAR LAMINECTOMY  09/07/2004   '@[facility]'$  by dr pool   RENAL ANGIOGRAPHY N/A 02/14/2020   Procedure: RENAL ANGIOGRAPHY;  Surgeon: Elam Dutch, MD;  Location: [facility];  Service: Cardiovascular;  Laterality: N/A;  Bilateral   ROBOT ASSISTED PYELOPLASTY Right 11/20/2020   Procedure: XI ROBOTIC RIGHT ASSISTED PYELOPLASTY AND STENT PLACEMENT;  Surgeon: Ardis Hughs, MD;  Location: [facility];  Service: Urology;  Laterality: Right;   TONSILLECTOMY     child   TOTAL HIP ARTHROPLASTY Right 07/15/2020   Procedure: TOTAL HIP ARTHROPLASTY ANTERIOR APPROACH;  Surgeon: Gaynelle Arabian, MD;  Location: [facility];  Service: Orthopedics;  Laterality: Right;  [id number]   UMBILICAL HERNIA REPAIR  2004    Home Medications:  Medications Prior to Admission  Medication Sig Dispense Refill Last Dose   albuterol (PROVENTIL) (2.5 MG/3ML) 0.083% nebulizer solution USE 1 VIAL VIA NEBULIZER EVERY 6 HOURS AS NEEDED FOR WHEEZING OR SHORTNESS OF BREATH (Patient taking differently: Take 2.5 mg by nebulization every 6 (six) hours as needed for wheezing or shortness of breath.) 225 mL 12 01/02/2023   albuterol (VENTOLIN HFA) 108 (90 Base) MCG/ACT inhaler INHALE 1 PUFF INTO THE LUNGS TWICE DAILY (Patient taking differently: Inhale 1 puff  into the lungs 2 (two) times daily as needed for wheezing or shortness of breath.) 8.5 g 5 01/03/2023   antiseptic oral rinse (BIOTENE) LIQD 15 mLs by Mouth Rinse route daily as needed for dry mouth.    01/03/2023   aspirin 325 MG tablet Take 325 mg by mouth at bedtime.   01/02/2023   atorvastatin (LIPITOR) 10 MG tablet Take 1 tablet by mouth daily.   Past Month   Azelastine-Fluticasone 137-50 MCG/ACT SUSP Place 1-2 sprays into both nostrils at bedtime. 23 g 3 01/03/2023   Cholecalciferol (VITAMIN D-3 PO) Take 1 capsule by mouth every other day.   Past Week   diltiazem (DILT-XR) 120 MG 24 hr capsule Take 1 capsule (120 mg total) by mouth daily. 90 capsule 3 01/03/2023   docusate sodium (COLACE)  100 MG capsule Take 100 mg by mouth daily.   01/02/2023   esomeprazole (NEXIUM) 40 MG capsule Take 40 mg by mouth daily as needed (acid reflux/indigestion.).   01/03/2023   finasteride (PROSCAR) 5 MG tablet Take 1 tablet by mouth at bedtime.   01/02/2023   furosemide (LASIX) 20 MG tablet Take 1 tablet (20 mg total) by mouth daily as needed. (Patient taking differently: Take 20 mg by mouth daily as needed for fluid or edema.) 30 tablet 3 Past Week   irbesartan (AVAPRO) 150 MG tablet Take 150 mg by mouth every evening.   01/02/2023   isosorbide mononitrate (IMDUR) 60 MG 24 hr tablet Take 60 mg by mouth every evening.   01/02/2023   loperamide (IMODIUM A-D) 2 MG tablet Take 2 mg by mouth as needed.   Past Week   MAGNESIUM-OXIDE 400 (240 Mg) MG tablet Take 400 mg by mouth every other day.   Past Week   Menthol, Topical Analgesic, (BLUE-EMU MAXIMUM STRENGTH EX) Apply 1 application topically 3 (three) times daily as needed (pain.).   Past Month   Polyethyl Glycol-Propyl Glycol (SYSTANE ULTRA OP) Place 1 drop into both eyes daily as needed (Dry eye).   Past Week   potassium chloride (KLOR-CON M) 10 MEQ tablet Take 1 tablet on the days Lasix is taken. (Patient taking differently: Take 10 mEq by mouth as needed. Take 1 tablet  on the days Lasix is taken as directed) 30 tablet 3 Past Week   simethicone (MYLICON) 048 MG chewable tablet Chew 125 mg by mouth every 6 (six) hours as needed for flatulence. Takes gas x   Past Week   Tiotropium Bromide-Olodaterol (STIOLTO RESPIMAT) 2.5-2.5 MCG/ACT AERS Inhale 2 puffs into the lungs daily. (Patient taking differently: Inhale 2 puffs into the lungs daily.) 4 g 12 01/03/2023   hydroxypropyl methylcellulose / hypromellose (GONAK) 2.5 % ophthalmic solution    Unknown   tamsulosin (FLOMAX) 0.4 MG CAPS capsule Take 1 capsule by mouth at bedtime. (Patient not taking: Reported on 12/30/2022)   Not Taking   traMADol (ULTRAM) 50 MG tablet Take 1 tablet by mouth 4 (four) times daily as needed.   More than a month   Allergies:  Allergies  Allergen Reactions   Penicillins     Childhood reaction  REACTION: rapid pulse Did it involve swelling of the face/tongue/throat, SOB, or low BP? Yes Did it involve sudden or severe rash/hives, skin peeling, or any reaction on the inside of your mouth or nose? Unknown Did you need to seek medical attention at a hospital or doctor's office? yes When did it last happen?      childhood  If all above answers are "NO", may proceed with cephalosporin use.    Fish Oil     Other reaction(s): Heart attack symptoms   Other     Oily seafood salmon-- chest pain   Penicillin G     Other reaction(s): Unknown   Mold Extract [Trichophyton] Rash    Family History  Problem Relation Age of Onset   Other Father        ETOH   Diabetes Father    Hypertension Father    Kidney cancer Mother    Cancer Mother        renal   Hyperlipidemia Mother    Hypertension Mother    Cancer Sister        breast   Hyperlipidemia Sister    Hypertension Sister    Breast cancer Sister  Hyperlipidemia Brother    Hypertension Brother    Prostate cancer Brother    Colon cancer Neg Hx    Colon polyps Neg Hx    Esophageal cancer Neg Hx    Rectal cancer Neg Hx    Stomach  cancer Neg Hx    Social History:  reports that he has been smoking cigarettes. He has a 76.50 pack-year smoking history. He has never been exposed to tobacco smoke. He has never used smokeless tobacco. He reports that he does not currently use alcohol. He reports that he does not use drugs.  ROS: A complete review of systems was performed.  All systems are negative except for pertinent findings as noted. ROS   Physical Exam:  Vital signs in last 24 hours: Temp:  [98.1 F (36.7 C)] 98.1 F (36.7 C) (01/16 0604) Pulse Rate:  [68] 68 (01/16 0604) Resp:  [17] 17 (01/16 0604) BP: (185)/(89) 185/89 (01/16 0604) SpO2:  [94 %] 94 % (01/16 0604) Weight:  [73.4 kg] 73.4 kg (01/16 0604) General:  Alert and oriented, No acute distress HEENT: Normocephalic, atraumatic Cardiovascular: Regular rate and rhythm Lungs: Regular rate and effort Abdomen: Soft, nontender, nondistended, no abdominal masses Back: No CVA tenderness Extremities: No edema Neurologic: Grossly intact  Laboratory Data:  Results for orders placed or performed during the hospital encounter of 01/03/23 (from the past 24 hour(s))  I-STAT, chem 8     Status: Abnormal   Collection Time: 01/03/23  6:53 AM  Result Value Ref Range   Sodium 138 135 - 145 mmol/L   Potassium 3.8 3.5 - 5.1 mmol/L   Chloride 105 98 - 111 mmol/L   BUN 23 8 - 23 mg/dL   Creatinine, Ser 1.70 (H) 0.61 - 1.24 mg/dL   Glucose, Bld 98 70 - 99 mg/dL   Calcium, Ion 0.88 (LL) 1.15 - 1.40 mmol/L   TCO2 26 22 - 32 mmol/L   Hemoglobin 17.0 13.0 - 17.0 g/dL   HCT 50.0 39.0 - 52.0 %   Comment NOTIFIED PHYSICIAN    No results found for this or any previous visit (from the past 240 hour(s)). Creatinine: Recent Labs    01/03/23 0653  CREATININE 1.70*    Impression/Assessment:  BPH with LUTS   Plan:  I discussed with the patient the nature, potential benefits, risks and alternatives to thulium laser vaporization of the prostate possible TURP, including  side effects of the proposed treatment, the likelihood of the patient achieving the goals of the procedure, and any potential problems that might occur during the procedure or recuperation.  We again discussed flow symptoms typically improve but frequency and urgency improvement can be variable and may may still need to treat the symptoms if they persist or worsen.  All questions answered. Patient elects to proceed.   Festus Aloe 01/03/2023, 7:22 AM

## 2023-01-03 NOTE — Progress Notes (Signed)
Dr. Myrtie Soman notified of ica 0.88.

## 2023-01-03 NOTE — Progress Notes (Signed)
Dr. Junious Silk notified of penicillin allergy and ancef ordered.

## 2023-01-04 LAB — SURGICAL PATHOLOGY

## 2023-01-25 DIAGNOSIS — L570 Actinic keratosis: Secondary | ICD-10-CM | POA: Diagnosis not present

## 2023-01-25 DIAGNOSIS — L821 Other seborrheic keratosis: Secondary | ICD-10-CM | POA: Diagnosis not present

## 2023-01-25 DIAGNOSIS — D225 Melanocytic nevi of trunk: Secondary | ICD-10-CM | POA: Diagnosis not present

## 2023-01-25 DIAGNOSIS — L578 Other skin changes due to chronic exposure to nonionizing radiation: Secondary | ICD-10-CM | POA: Diagnosis not present

## 2023-01-25 DIAGNOSIS — Z85828 Personal history of other malignant neoplasm of skin: Secondary | ICD-10-CM | POA: Diagnosis not present

## 2023-02-06 DIAGNOSIS — J449 Chronic obstructive pulmonary disease, unspecified: Secondary | ICD-10-CM | POA: Diagnosis not present

## 2023-02-06 DIAGNOSIS — F17200 Nicotine dependence, unspecified, uncomplicated: Secondary | ICD-10-CM | POA: Diagnosis not present

## 2023-02-06 DIAGNOSIS — I130 Hypertensive heart and chronic kidney disease with heart failure and stage 1 through stage 4 chronic kidney disease, or unspecified chronic kidney disease: Secondary | ICD-10-CM | POA: Diagnosis not present

## 2023-02-06 DIAGNOSIS — E1159 Type 2 diabetes mellitus with other circulatory complications: Secondary | ICD-10-CM | POA: Diagnosis not present

## 2023-02-06 DIAGNOSIS — E669 Obesity, unspecified: Secondary | ICD-10-CM | POA: Diagnosis not present

## 2023-02-06 DIAGNOSIS — I5032 Chronic diastolic (congestive) heart failure: Secondary | ICD-10-CM | POA: Diagnosis not present

## 2023-02-06 DIAGNOSIS — N1832 Chronic kidney disease, stage 3b: Secondary | ICD-10-CM | POA: Diagnosis not present

## 2023-02-07 DIAGNOSIS — R350 Frequency of micturition: Secondary | ICD-10-CM | POA: Diagnosis not present

## 2023-02-07 DIAGNOSIS — N401 Enlarged prostate with lower urinary tract symptoms: Secondary | ICD-10-CM | POA: Diagnosis not present

## 2023-02-07 DIAGNOSIS — R3912 Poor urinary stream: Secondary | ICD-10-CM | POA: Diagnosis not present

## 2023-02-13 ENCOUNTER — Other Ambulatory Visit: Payer: Self-pay | Admitting: *Deleted

## 2023-02-13 MED ORDER — STIOLTO RESPIMAT 2.5-2.5 MCG/ACT IN AERS: 2.0000 | INHALATION_SPRAY | Freq: Every day | RESPIRATORY_TRACT | 12 refills | Status: DC

## 2023-02-21 ENCOUNTER — Encounter: Payer: Self-pay | Admitting: Podiatry

## 2023-02-21 ENCOUNTER — Ambulatory Visit: Payer: Medicare PPO | Admitting: Podiatry

## 2023-02-21 VITALS — BP 164/78

## 2023-02-21 DIAGNOSIS — I739 Peripheral vascular disease, unspecified: Secondary | ICD-10-CM

## 2023-02-21 DIAGNOSIS — M79676 Pain in unspecified toe(s): Secondary | ICD-10-CM

## 2023-02-21 DIAGNOSIS — L84 Corns and callosities: Secondary | ICD-10-CM | POA: Diagnosis not present

## 2023-02-21 DIAGNOSIS — B351 Tinea unguium: Secondary | ICD-10-CM

## 2023-02-21 NOTE — Progress Notes (Unsigned)
  Subjective:  Patient ID: Dean [name]., male    DOB: 1946-07-04,  MRN: [id number]  Dean [name]. presents to clinic today for {jgcomplaint:23593}  Chief Complaint  Patient presents with   Nail Problem    RFC PCP-[facility] PCP VST-01/2023   New problem(s): None. {jgcomplaint:23593}  PCP is [name], MD.  Allergies  Allergen Reactions   Penicillins     Childhood reaction  REACTION: rapid pulse Did it involve swelling of the face/tongue/throat, SOB, or low BP? Yes Did it involve sudden or severe rash/hives, skin peeling, or any reaction on the inside of your mouth or nose? Unknown Did you need to seek medical attention at a hospital or doctor's office? yes When did it last happen?      childhood  If all above answers are "NO", may proceed with cephalosporin use.    Fish Oil     Other reaction(s): Heart attack symptoms   Other     Oily seafood salmon-- chest pain   Penicillin G     Other reaction(s): Unknown   Mold Extract [Trichophyton] Rash    Review of Systems: Negative except as noted in the HPI.  Objective: No changes noted in today's physical examination. Vitals:   02/21/23 1505  BP: (!) 164/78   Dean [name]. is a pleasant 77 y.o. male {jgbodyhabitus:24098} AAO x 3.  Assessment/Plan: No diagnosis found.  No orders of the defined types were placed in this encounter.   None {Jgplan:23602::"-Patient/POA to call should there be question/concern in the interim."}   Return in about 3 months (around 05/24/2023).  [name], DPM

## 2023-04-13 ENCOUNTER — Telehealth: Payer: Self-pay | Admitting: Internal Medicine

## 2023-04-13 MED ORDER — FUROSEMIDE 20 MG PO TABS: 20.0000 mg | ORAL_TABLET | Freq: Every day | ORAL | 2 refills | Status: DC | PRN

## 2023-04-13 MED ORDER — POTASSIUM CHLORIDE CRYS ER 10 MEQ PO TBCR: EXTENDED_RELEASE_TABLET | ORAL | 2 refills | Status: DC

## 2023-04-13 MED ORDER — DILTIAZEM HCL ER 120 MG PO CP24: 120.0000 mg | ORAL_CAPSULE | Freq: Every day | ORAL | 2 refills | Status: DC

## 2023-04-13 NOTE — Telephone Encounter (Signed)
Refill sent to [facility].

## 2023-04-13 NOTE — Telephone Encounter (Signed)
*  STAT* If patient is at the pharmacy, call can be transferred to refill team.   1. Which medications need to be refilled? (please list name of each medication and dose if known)  furosemide (LASIX) 20 MG tablet diltiazem (DILT-XR) 120 MG 24 hr capsule potassium chloride (KLOR-CON M) 10 MEQ tablet  2. Which pharmacy/location (including street and city if local pharmacy) is medication to be sent to? [facility] [address], [place], [place] [address]  3. Do they need a 30 day or 90 day supply?90 Day Supply for all medications

## 2023-05-09 ENCOUNTER — Telehealth: Payer: Self-pay | Admitting: Internal Medicine

## 2023-05-09 NOTE — Telephone Encounter (Signed)
[facility] needs RX's  sent at latest tomorrow, to [facility].  FAX # is [phone]  Meds are:   [place] and Albuterol   PT is requesting this.

## 2023-05-10 ENCOUNTER — Other Ambulatory Visit: Payer: Self-pay

## 2023-05-10 MED ORDER — STIOLTO RESPIMAT 2.5-2.5 MCG/ACT IN AERS: 2.0000 | INHALATION_SPRAY | Freq: Every day | RESPIRATORY_TRACT | 12 refills | Status: DC

## 2023-05-10 MED ORDER — ALBUTEROL SULFATE HFA 108 (90 BASE) MCG/ACT IN AERS: 1.0000 | INHALATION_SPRAY | Freq: Two times a day (BID) | RESPIRATORY_TRACT | 5 refills | Status: DC | PRN

## 2023-05-10 MED ORDER — AZELASTINE-FLUTICASONE 137-50 MCG/ACT NA SUSP
1.0000 | Freq: Every day | NASAL | 3 refills | Status: AC
Start: 2023-05-10 — End: ?

## 2023-05-10 MED ORDER — ALBUTEROL SULFATE (2.5 MG/3ML) 0.083% IN NEBU: INHALATION_SOLUTION | RESPIRATORY_TRACT | 12 refills | Status: DC

## 2023-05-10 NOTE — Telephone Encounter (Signed)
Medications have been sent to new pharmacy. NFN

## 2023-06-18 IMAGING — CT CT ABD-PELV W/ CM
2 of 5 series · 14 of 46 positions shown, 16 images · IV contrast (agent unspecified)
Comparison: CT 02/26/2020.

CLINICAL DATA: Abdominal pain, acute, nonlocalized bright red blood
per rectum.

EXAM:
CT ABDOMEN AND PELVIS WITH CONTRAST
TECHNIQUE: Multidetector CT imaging of the abdomen and pelvis was performed
using the standard protocol following bolus administration of
intravenous contrast.

[Series 2: axial st · axial · 0.83mm/px · z∈[-760,-340]mm · 11 of 98 slices shown, 13 images]
[im 7/98  soft-tissue]
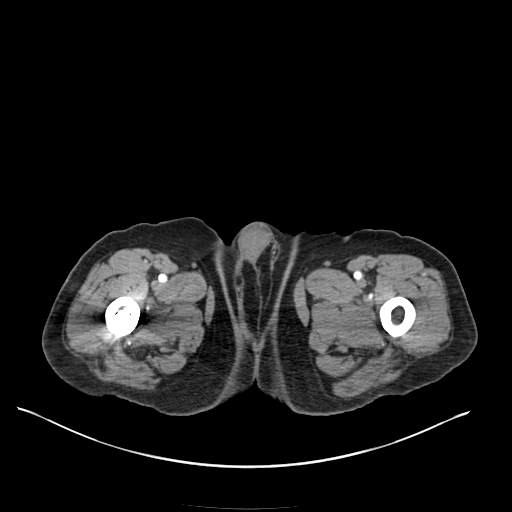
[im 7/98  bone]
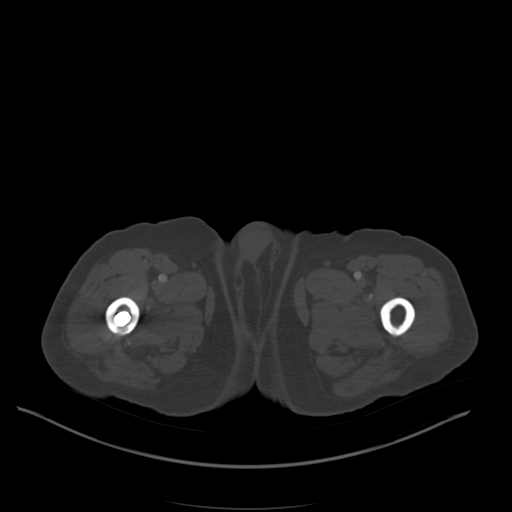
[im 14/98  soft-tissue]
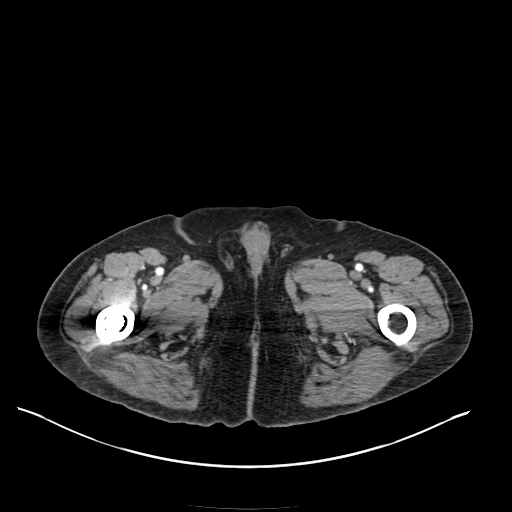
[im 21/98  soft-tissue]
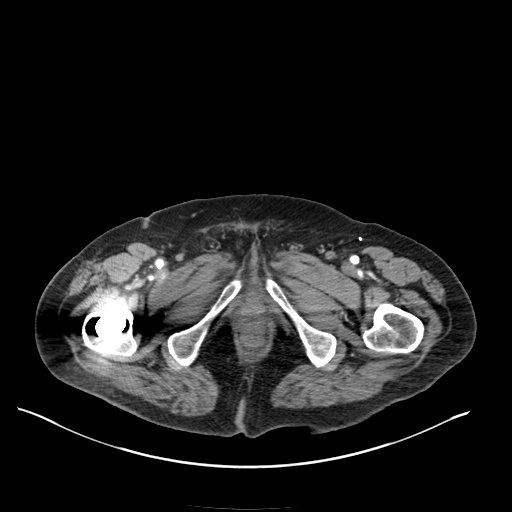
[im 35/98  soft-tissue]
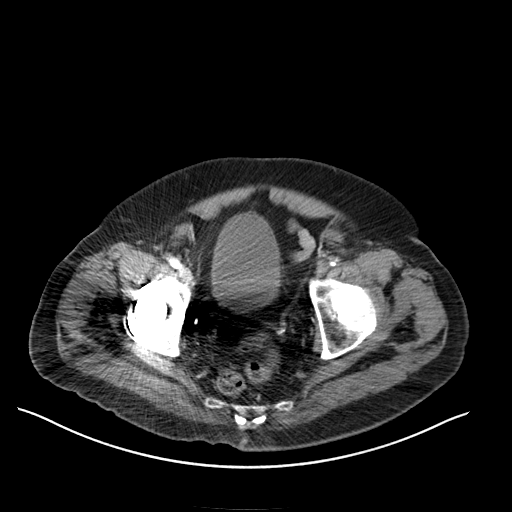
[im 42/98  soft-tissue]
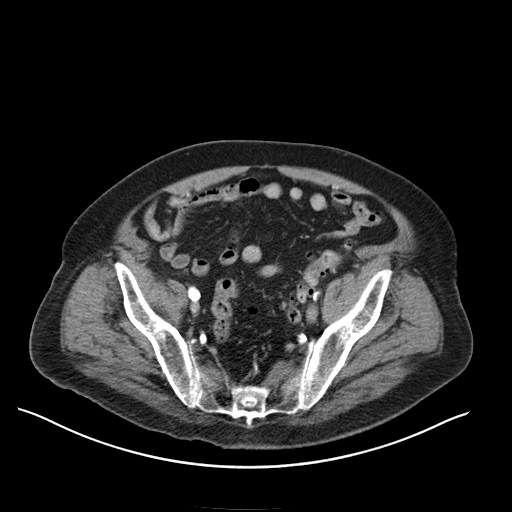
[im 49/98  soft-tissue]
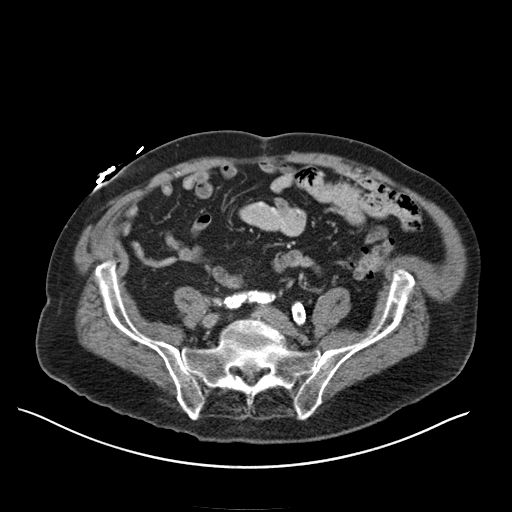
[im 56/98  soft-tissue]
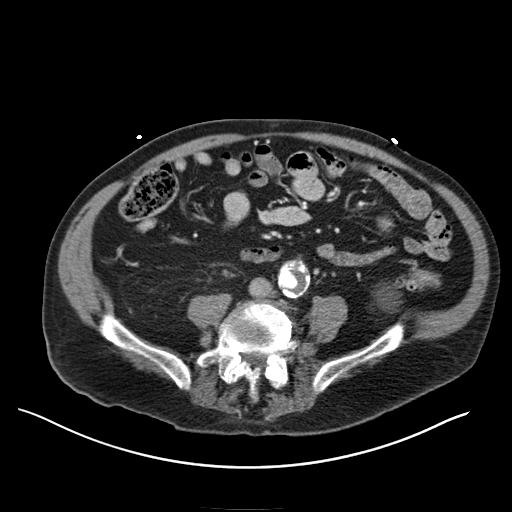
[im 63/98  soft-tissue]
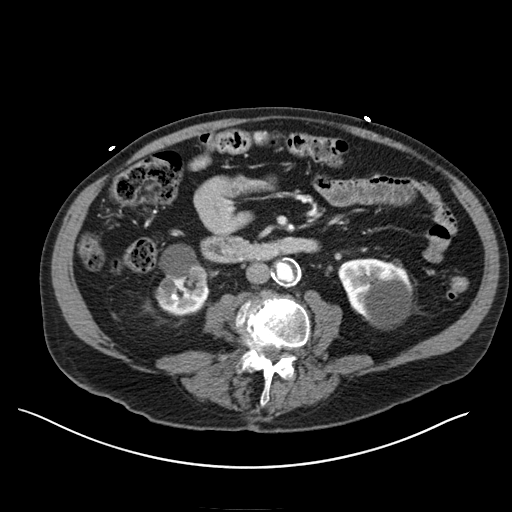
[im 77/98  soft-tissue]
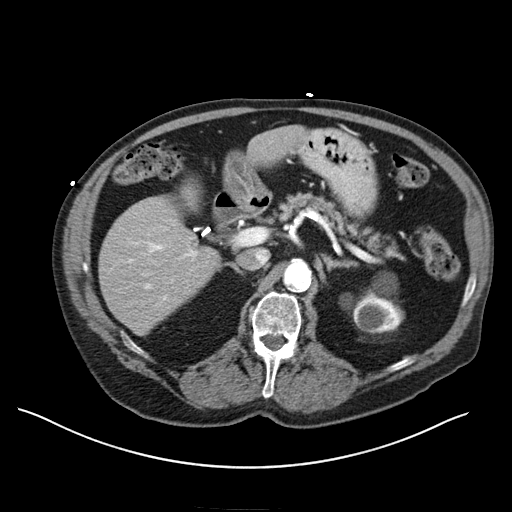
[im 77/98  bone]
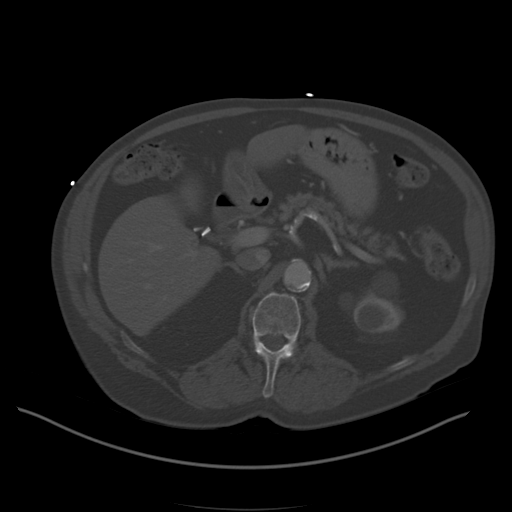
[im 84/98  soft-tissue]
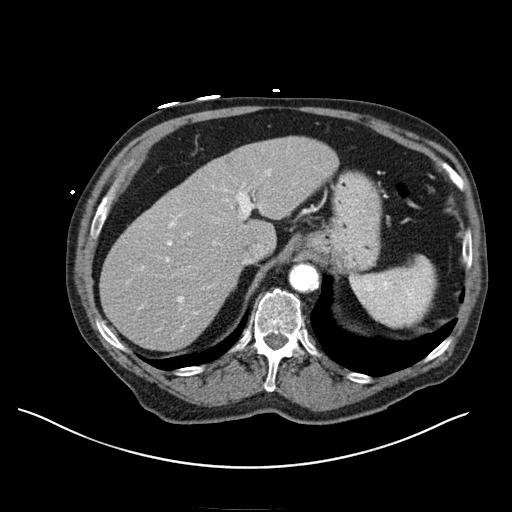
[im 91/98  soft-tissue]
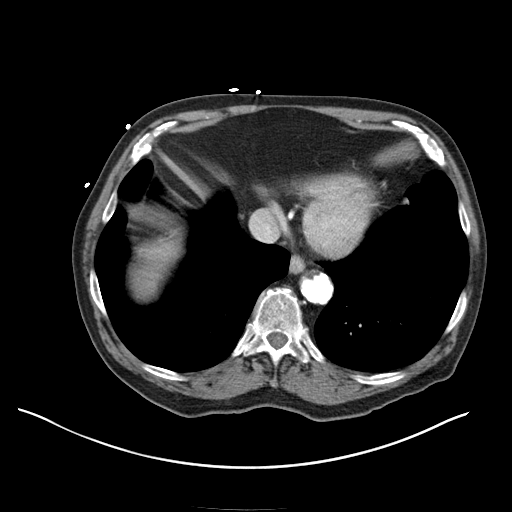

[Series 4: coronal st · coronal · 0.92mm/px · 3 of 144 slices shown]
[im 48/144  soft-tissue]
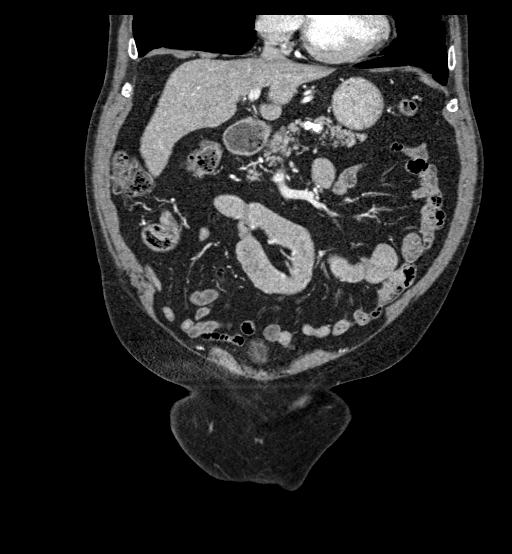
[im 64/144  soft-tissue]
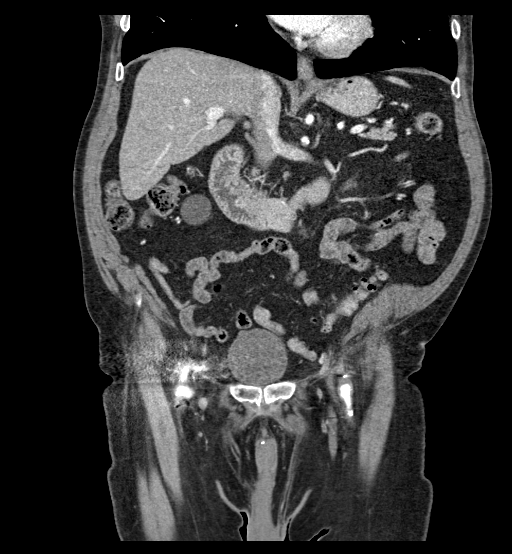
[im 80/144  soft-tissue]
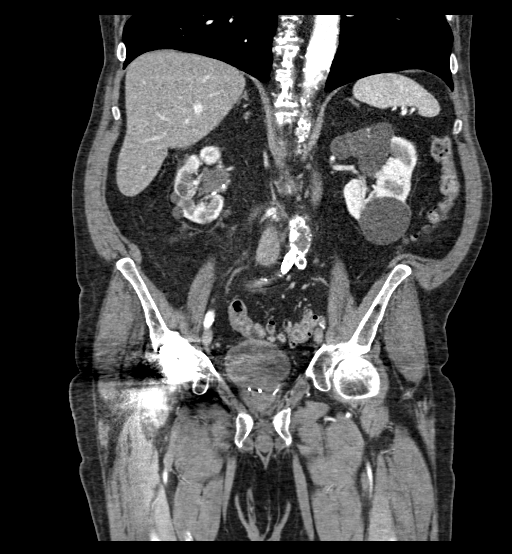

[14 of 46 positions shown; findings below may reference images not displayed]

RADIATION DOSE REDUCTION: This exam was performed according to the
departmental dose-optimization program which includes automated
exposure control, adjustment of the mA and/or kV according to
patient size and/or use of iterative reconstruction technique.

CONTRAST:  100mL OMNIPAQUE IOHEXOL 300 MG/ML  SOLN
FINDINGS: Lower chest: Centrilobular emphysema.  No acute abnormality.

Hepatobiliary: No focal liver abnormality is seen. Prior
cholecystectomy.

Pancreas: Unremarkable. No pancreatic ductal dilatation or
surrounding inflammatory changes.

Spleen: Normal in size without focal abnormality.

Adrenals/Urinary Tract: Bilateral renal pelvic fullness. There is
some higher density material in the right renal pelvis. The ureters
appear decompressed. There are multiple simple appearing renal cysts
bilaterally multiple left-sided renal sinus cysts. There are renal
vascular calcifications bilaterally. There is a cluster of stones in
the lower pole the right kidney largest measuring 4 mm. There is a
1-2 mm possible stone at the right ureteropelvic junction (series 2,
image 36). There is an indeterminate complex cystic lesion in the
lower pole the right kidney measuring 1.6 x 1.2. (Series 2, image
37). There is a minimally complex cyst in the right mid kidney
measuring 1.5 x 1.2 cm with thin internal septation (series 2, image
33). The bladder is unremarkable.

Stomach/Bowel: The stomach is within normal limits. There is no
evidence of bowel obstruction.The appendix is surgically absent.
Extensive sigmoid diverticulosis. No acute diverticulitis.

Vascular/Lymphatic: Extensive mixed atherosclerosis of the abdominal
aorta and branch vessels. Mild stenosis at the celiac ostia.
Moderate stenosis at the SMA origin. The IMA is poorly opacified.

There is an infrarenal abdominal aortic aneurysm measuring 3.4 x
cm, previously 3.1 x 2.8 cm. There is extensive mural thrombus
resulting in moderate stenosis of the distal aorta just proximal to
the aortic bifurcation.

There are bilateral common iliac stents with occlusion of the left
internal and external iliac arteries. There is a fem-fem bypass
graft which is patent and supplies the left superficial femoral and
profundus femoris arteries. The right common femoral, proximal SFA
and profundus arteries are patent. Patent right common iliac stent.

Reproductive: Postprocedural changes of the prostate.

Other: There is a small recurrent umbilical hernia containing a
nonobstructed loop of small bowel. No abdominopelvic ascites. No
free air.

Musculoskeletal: Multilevel degenerative changes of the spine worst
at L3-L4 and L4-L5. No acute osseous abnormality. There is avascular
necrosis of the left femoral head without articular surface
collapse. Mild left hip osteoarthritis. Prior right hip arthroplasty
in normal alignment without evidence of complication.
IMPRESSION: Extensive sigmoid diverticulosis.  No acute diverticulitis.

Small recurrent umbilical hernia containing a nonobstructed loop of
small bowel.

Mild bilateral renal pelvic fullness, with some higher density
material in the right renal pelvis, recommend correlation with
urinalysis. Cluster of nonobstructing stones in the lower pole the
right kidney largest measuring 4 mm. Possible 1-2 mm stone at the
right ureteropelvic junction.

Multiple cystic renal lesions bilaterally, too of which appear
complex in the right mid and lower pole. Recommend nonemergent
contrast enhanced renal protocol CT or MRI for further evaluation.

Avascular necrosis of the left femoral head without articular
surface collapse.

Infrarenal abdominal aortic aneurysm measures 3.4 cm with mural
thrombus resulting in moderate stenosis just proximal to the aortic
bifurcation. Aneurysm previously measured up to 3.1 cm in February 2020. Additional vascular findings as described above. Recommend
follow-up ultrasound every 3 years. This recommendation follows ACR
consensus guidelines: White Paper of the ACR Incidental Findings
Committee II on Vascular Findings. [HOSPITAL] 0795;

## 2023-06-21 ENCOUNTER — Ambulatory Visit: Payer: Medicare PPO | Admitting: Podiatry

## 2023-06-21 ENCOUNTER — Encounter: Payer: Self-pay | Admitting: Podiatry

## 2023-06-21 DIAGNOSIS — I739 Peripheral vascular disease, unspecified: Secondary | ICD-10-CM

## 2023-06-21 DIAGNOSIS — M79676 Pain in unspecified toe(s): Secondary | ICD-10-CM

## 2023-06-21 DIAGNOSIS — L84 Corns and callosities: Secondary | ICD-10-CM

## 2023-06-21 DIAGNOSIS — B351 Tinea unguium: Secondary | ICD-10-CM | POA: Diagnosis not present

## 2023-06-26 NOTE — Progress Notes (Signed)
  Subjective:  Patient ID: Dean [name]., male    DOB: 11-04-1946,  MRN: [id number]  Dean [name]. presents to clinic today for at risk foot care. Patient has h/o PAD and callus(es) both feet and painful thick toenails that are difficult to trim. Painful toenails interfere with ambulation. Aggravating factors include wearing enclosed shoe gear. Pain is relieved with periodic professional debridement. Painful calluses are aggravated when weightbearing with and without shoegear. Pain is relieved with periodic professional debridement.  Chief Complaint  Patient presents with   NAIL CARE    RFC   New problem(s): None.   PCP is Geoffry Paradise, MD.  Allergies  Allergen Reactions   Penicillins     Childhood reaction  REACTION: rapid pulse Did it involve swelling of the face/tongue/throat, SOB, or low BP? Yes Did it involve sudden or severe rash/hives, skin peeling, or any reaction on the inside of your mouth or nose? Unknown Did you need to seek medical attention at a hospital or doctor's office? yes When did it last happen?      childhood  If all above answers are "NO", may proceed with cephalosporin use.    Fish Oil     Other reaction(s): Heart attack symptoms   Other     Oily seafood salmon-- chest pain   Penicillin G     Other reaction(s): Unknown   Mold Extract [Trichophyton] Rash    Review of Systems: Negative except as noted in the HPI.  Objective: No changes noted in today's physical examination. There were no vitals filed for this visit. Dean [name]. is a pleasant 77 y.o. male WD, WN in NAD. AAO x 3.  Vascular:  CFT <4 seconds b/l LE. Faintly palpable DP pulses b/l LE. Diminished PT pulse(s) b/l LE. Lower extremity skin temperature gradient within normal limits. No ischemia or gangrene noted b/l LE. No cyanosis or clubbing noted b/l LE.  Dermatological:  Pedal skin thin and atrophic b/l LE. No open wounds b/l lower extremities. No interdigital  macerations b/l lower extremities. Toenails 2-5 bilaterally and R hallux elongated, discolored, dystrophic, thickened, and crumbly with subungual debris and tenderness to dorsal palpation. Anonychia noted b/l hallux. Nailbed(s) epithelialized.    Hyperkeratotic lesion(s) L hallux. No erythema, no edema, no drainage, no fluctuance.  Musculoskeletal:  Normal muscle strength 5/5 to all lower extremity muscle groups bilaterally. No pain crepitus or joint limitation noted with ROM b/l. Hallux valgus with bunion deformity noted b/l lower extremities.  Neurological:  Protective sensation intact 5/5 intact bilaterally with 10g monofilament b/l. Vibratory sensation intact b/l.  Assessment/Plan: 1. Pain due to onychomycosis of toenail   2. Callus   3. PAD (peripheral artery disease) (HCC)    -Consent given for treatment as described below: -Examined patient. -Patient to continue soft, supportive shoe gear daily. -Mycotic toenails 1-5 bilaterally were debrided in length and girth with sterile nail nippers and dremel without incident. -Callus(es) left great toe and 1st metatarsal head left foot pared utilizing rotary bur without complication or incident. Total number pared =2. -Patient/POA to call should there be question/concern in the interim.   Return in about 3 months (around 09/21/2023).  Freddie Breech, DPM

## 2023-06-28 DIAGNOSIS — N401 Enlarged prostate with lower urinary tract symptoms: Secondary | ICD-10-CM | POA: Diagnosis not present

## 2023-06-28 DIAGNOSIS — R972 Elevated prostate specific antigen [PSA]: Secondary | ICD-10-CM | POA: Diagnosis not present

## 2023-06-28 DIAGNOSIS — B356 Tinea cruris: Secondary | ICD-10-CM | POA: Diagnosis not present

## 2023-06-28 DIAGNOSIS — R350 Frequency of micturition: Secondary | ICD-10-CM | POA: Diagnosis not present

## 2023-06-28 NOTE — Progress Notes (Deleted)
Patient ID: Dean [name], male    DOB: [birth year], 77 y.o.   MRN: [id number]  HPI M  Smoker followed for COPD, obstructive sleep apnea, lung nodule, allergic rhinitis. NPSG 04/06/1999   AHI 87/hr, desaturation to 83%, body weight 185 pounds PFT- /04/2009-severe obstructive airways disease with insignificant response to bronchodilator. FVC 3.18/83%, FEV1 1.53/56%, ratio 0.48, TLC 86%, DLCO 51% Office Spirometry 03/20/2017-very severe obstructive airways disease. 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 No concerns Still smoking. Stays up late, then sleeps ok.  Hosp for GIB in March. Breathing no better and understands role of smoking. Preferred [employer] over [employer]. 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 Heart size is normal. No pneumothorax or pleural effusion. Aortic atherosclerosis. No acute or focal bony abnormality. IMPRESSION: No acute disease.  06/29/23- 77 yoM  Smoker (51 pkyrs) followed for COPD, allergic rhinitis, , Obstructive Sleep Apnea, lung nodule, allergic rhinitis., complicated by PAD, HTN, CAD, AAA, GERD, Parotid Mass (Dr Rosen/ ENT), DM2,  -Proventil hfa, neb albuterol, Dymista nasal, Adderall 10 bid,  CPAP auto 5-20/ [employer]-compliance  Body weight today-  CXR 06/30/22- IMPRESSION: No active cardiopulmonary disease.  ROS-see HPI  + = positive Constitutional:  +   weight loss,  night sweats, fevers, chills, f+atigue, lassitude. HEENT:   No-  headaches, difficulty swallowing, tooth/dental problems, sore throat,       No-  sneezing, itching, ear ache, +nasal congestion, post nasal drip,  CV:  No-   chest pain, orthopnea, PND, swelling in lower extremities, anasarca,  dizziness, palpitations Resp: + shortness of breath with exertion or at rest.           +   productive cough,  + non-productive cough,  No- coughing up of blood.              No-   change in color of mucus.  + wheezing.   Skin: No-   rash or lesions. GI:  No-   heartburn, indigestion, abdominal pain, nausea, vomiting, + diarrhea GU:  MS:  + joint pain or swelling, + back pain . Neuro-     nothing unusual Psych:  No- change in mood or affect. No depression or anxiety.  No memory loss.  Objective:  OBJ- Physical Exam General- Alert, Oriented, Affect-appropriate, Distress- none acute.   +rolling walker Skin- rash-none, lesions- none, excoriation- none Lymphadenopathy- none Head- atraumatic            Eyes- Gross vision intact, PERRLA, conjunctivae and secretions clear            Ears- Hearing, canals-normal            Nose- + Turbinate edema, no-Septal dev, mucus, polyps, erosion, perforation             Throat- Mallampati III , mucosa clear , drainage- none, tonsils- atrophic Neck- flexible , trachea midline, no stridor , thyroid nl, carotid no bruit, + mass left submandibular is less evident Chest - symmetrical excursion , unlabored  Heart/CV- RRR , no murmur , no gallop  , no rub, nl s1 s2                           - JVD- none , edema- none, stasis changes- none, varices- none           Lung- + diminished/few crackles, wheeze- none,  Cough+light, dullness-none, rub- none           Chest wall-  Abd-  Br/ Gen/ Rectal- Not done, not indicated Extrem- cyanosis- none, clubbing, none, atrophy- none, strength- nl, + rolling walker Neuro- grossly intact to observation

## 2023-06-29 ENCOUNTER — Ambulatory Visit: Payer: Medicare PPO | Admitting: Internal Medicine

## 2023-10-03 ENCOUNTER — Ambulatory Visit: Payer: Medicare PPO | Admitting: Podiatry

## 2023-10-04 ENCOUNTER — Ambulatory Visit: Payer: Medicare PPO | Admitting: Podiatry

## 2023-12-10 NOTE — Progress Notes (Unsigned)
Patient ID: Dean [name], male    DOB: [birth year], 77 y.o.   MRN: [id number]  HPI M  Smoker followed for COPD, obstructive sleep apnea, lung nodule, allergic rhinitis. NPSG 04/06/1999   AHI 87/hr, desaturation to 83%, body weight 185 pounds PFT- /04/2009-severe obstructive airways disease with insignificant response to bronchodilator. FVC 3.18/83%, FEV1 1.53/56%, ratio 0.48, TLC 86%, DLCO 51% Office Spirometry 03/20/2017-very severe obstructive airways disease. FVC 1.64/46%, FEV1 0.73/28%, ratio 0.45 ----------------------------------------------------------------------------------------------  06/30/22-[name]  Smoker (51 pkyrs) followed for COPD, allergic rhinitis, , Obstructive Sleep Apnea, lung nodule, allergic rhinitis., complicated by PAD, HTN, CAD, AAA, GERD, Parotid Mass (Dr Rosen/ ENT), DM2,  -Proventil hfa, neb albuterol, Dymista nasal, Adderall 10 bid,  CPAP auto 5-20/ [employer]-compliance 97%, AHI 0.7/ hr Body weight today-172 lbs Covid vax-4 [employer] -----Patient feels like he is doing good, states that he stays up to late at night. No concerns Still smoking. Stays up late, then sleeps ok.  Hosp for GIB in March. Breathing no better and understands role of smoking. Preferred [employer] over [employer]. 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 Heart size is normal. No pneumothorax or pleural effusion. Aortic atherosclerosis. No acute or focal bony abnormality. IMPRESSION: No acute disease.  12/11/23- 77 yoM  Smoker (51 pkyrs) followed for COPD, allergic rhinitis, Obstructive Sleep Apnea, lung nodule, allergic rhinitis, complicated by PAD, HTN, CAD, AAA, GERD, Parotid Mass (Dr Rosen/ ENT), DM2,  -Proventil hfa, neb albuterol, Dymista nasal, Adderall 10 bid,  CPAP auto 5-20/ [employer]-compliance 0%, AHI 5.1/hr Body weight today-157 lbs Discussed the use of AI scribe software for clinical note transcription with the patient, who gave verbal consent  to proceed.  History of Present Illness   The patient, with a history of sleep apnea, presents with issues using his CPAP machine. He reports that he is only able to use the machine for about an hour each night, if at all. He speculates that he may be experiencing nasal congestion, which could be affecting the machine's efficacy. The patient also mentions recent kidney procedures, during which he was put under anesthesia. He expresses concern that his cognitive function has been impaired since the procedures.  In addition to the CPAP machine issues, the patient reports increased daytime sleepiness, often falling asleep in front of the television. He also mentions a history of smoking, currently smoking just over a pack a day. The patient has a history of depression and recently lost his last pet dog. He expresses reluctance to get another pet due to his physical limitations.     CXR 07/01/22 IMPRESSION: No active cardiopulmonary disease.   ROS-see HPI  + = positive Constitutional:  +   weight loss, night sweats, fevers, chills, f+atigue, lassitude. HEENT:   No-  headaches, difficulty swallowing, tooth/dental problems, sore throat,       No-  sneezing, itching, ear ache, +nasal congestion, post nasal drip,  CV:  No-   chest pain, orthopnea, PND, swelling in lower extremities, anasarca,  dizziness, palpitations Resp: + shortness of breath with exertion or at rest.           +   productive cough,  + non-productive cough,  No- coughing up of blood.              No-   change in color of mucus.  + wheezing.   Skin: No-   rash or lesions. GI:  No-   heartburn, indigestion, abdominal pain, nausea, vomiting, +  diarrhea GU:  MS:  + joint pain or swelling, + back pain . Neuro-     nothing unusual Psych:  No- change in mood or affect. No depression or anxiety.  No memory loss.  Objective:  OBJ- Physical Exam General- Alert, Oriented, Affect-appropriate, Distress- none acute.   +rolling walker Skin-  rash-none, lesions- none, excoriation- none Lymphadenopathy- none Head- atraumatic            Eyes- Gross vision intact, PERRLA, conjunctivae and secretions clear            Ears- Hearing, canals-normal            Nose- + Turbinate edema, no-Septal dev, mucus, polyps, erosion, perforation             Throat- Mallampati III , mucosa clear , drainage- none, tonsils- atrophic Neck- flexible , trachea midline, no stridor , thyroid nl, carotid no bruit, + mass left submandibular is less evident Chest - symmetrical excursion , unlabored           Heart/CV- RRR , no murmur , no gallop  , no rub, nl s1 s2                           - JVD- none , edema- none, stasis changes- none, varices- none           Lung- + diminished/few crackles, wheeze- none,  Cough+light, dullness-none, rub- none           Chest wall-  Abd-  Br/ Gen/ Rectal- Not done, not indicated Extrem- cyanosis- none, clubbing, none, atrophy- none, strength- nl, + rolling walker Neuro- grossly intact to observation Assessment and Plan    Obstructive Sleep Apnea Patient reports difficulty with CPAP use, possibly due to nasal congestion. CPAP machine is old and may not be functioning properly. Patient's weight has decreased since the original sleep study. -Schedule a new sleep study given the age of the previous study and changes in patient's weight and symptoms. -Then we will order replacement machine  Tobacco Use Patient continues to smoke over a pack a day. -Encourage continued efforts to reduce and ultimately quit smoking. -Consider low-dose CT screening for early detection of lung cancer, pending eligibility assessment.  Depression Patient reports feeling depressed, recently lost a pet, and has limited social support. -Consider mental health evaluation and support as needed.  General Health Maintenance -Continue to monitor respiratory symptoms and cardiovascular health.

## 2023-12-11 ENCOUNTER — Ambulatory Visit: Payer: Medicare PPO | Admitting: Internal Medicine

## 2023-12-11 ENCOUNTER — Encounter: Payer: Self-pay | Admitting: Internal Medicine

## 2023-12-11 ENCOUNTER — Ambulatory Visit: Payer: Medicare PPO

## 2023-12-11 VITALS — BP 150/88 | HR 49 | Ht 64.0 in | Wt 157.8 lb

## 2023-12-11 DIAGNOSIS — J449 Chronic obstructive pulmonary disease, unspecified: Secondary | ICD-10-CM

## 2023-12-11 DIAGNOSIS — G4733 Obstructive sleep apnea (adult) (pediatric): Secondary | ICD-10-CM | POA: Diagnosis not present

## 2023-12-11 NOTE — Patient Instructions (Addendum)
Order- schedule split night sleep study with CPAP to BIPAP titration  dx OSA We will use this information to guide me when we reorder your CPAP  Please keep trying to cut down on  your smoking  Order- CXR   dx COPD mixed type  Order- refer to Kandice Robinsons team - enroll in low dose CT chest screening

## 2023-12-13 ENCOUNTER — Encounter: Payer: Self-pay | Admitting: Internal Medicine

## 2023-12-18 NOTE — Addendum Note (Signed)
Addended by: Carnella Guadalajara on: 12/18/2023 02:26 PM   Modules accepted: Orders

## 2024-01-15 DIAGNOSIS — Z961 Presence of intraocular lens: Secondary | ICD-10-CM | POA: Diagnosis not present

## 2024-01-15 DIAGNOSIS — H353132 Nonexudative age-related macular degeneration, bilateral, intermediate dry stage: Secondary | ICD-10-CM | POA: Diagnosis not present

## 2024-01-15 DIAGNOSIS — H26491 Other secondary cataract, right eye: Secondary | ICD-10-CM | POA: Diagnosis not present

## 2024-01-15 DIAGNOSIS — H04123 Dry eye syndrome of bilateral lacrimal glands: Secondary | ICD-10-CM | POA: Diagnosis not present

## 2024-02-05 ENCOUNTER — Ambulatory Visit (HOSPITAL_BASED_OUTPATIENT_CLINIC_OR_DEPARTMENT_OTHER): Payer: Medicare PPO | Admitting: Internal Medicine

## 2024-02-07 ENCOUNTER — Other Ambulatory Visit: Payer: Self-pay | Admitting: Emergency Medicine

## 2024-02-07 ENCOUNTER — Telehealth: Payer: Self-pay | Admitting: Acute Care

## 2024-02-07 ENCOUNTER — Encounter: Payer: Self-pay | Admitting: Acute Care

## 2024-02-07 DIAGNOSIS — Z122 Encounter for screening for malignant neoplasm of respiratory organs: Secondary | ICD-10-CM

## 2024-02-07 DIAGNOSIS — Z87891 Personal history of nicotine dependence: Secondary | ICD-10-CM

## 2024-02-07 NOTE — Telephone Encounter (Signed)
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   SDMV:02/15/2024 at 1:15 with Dirk Dress NP        1946-12-06   LDCT: 02/19/2024 at 4:00 at [facility]     78 y.o.   Phone: [phone]  Lung Screening Narrative (confirm age 62-77 yrs Medicare / 50-80 yrs Private pay insurance)   Insurance information:[employer] medicare   Referring Provider:Dr. Maple Hudson   This screening involves an initial phone call with a team member from our program. It is called a shared decision making visit. The initial meeting is required by  insurance and Medicare to make sure you understand the program. This appointment takes about 15-20 minutes to complete. You will complete the screening scan at your scheduled date/time.  This scan takes about 5-10 minutes to complete. You can eat and drink normally before and after the scan.  Criteria questions for Lung Cancer Screening:   Are you a current or former smoker? Current Age began smoking: 78yo   If you are a former smoker, what year did you quit smoking? N/A(within 15 yrs)   To calculate your smoking history, I need an accurate estimate of how many packs of cigarettes you smoked per day and for how many years. (Not just the number of PPD you are now smoking)   Years smoking 56 x Packs per day 1.5 = Pack years 84   (at least 20 pack yrs)   (Make sure they understand that we need to know how much they have smoked in the past, not just the number of PPD they are smoking now)  Do you have a personal history of cancer?  Yes - (type and when diagnosed - 5 yrs cancer free) Patient states he had a history of skin cancer, unsure of type. Patient states the area was removed and he did not receive any treatment after.     Do you have a family history of cancer? Yes  (cancer type and and relative) Mother - renal. Sister - breast.   Are you coughing up blood?  No  Have you had unexplained  weight loss of 15 lbs or more in the last 6 months? No  It looks like you meet all criteria.  When would be a good time for [place] to schedule you for this screening?   Additional information: N/A

## 2024-02-15 ENCOUNTER — Encounter: Payer: Self-pay | Admitting: Adult Health

## 2024-02-15 ENCOUNTER — Ambulatory Visit: Payer: Medicare PPO | Admitting: Adult Health

## 2024-02-15 DIAGNOSIS — F17210 Nicotine dependence, cigarettes, uncomplicated: Secondary | ICD-10-CM | POA: Diagnosis not present

## 2024-02-15 NOTE — Patient Instructions (Signed)

## 2024-02-15 NOTE — Progress Notes (Signed)
  Virtual Visit via Telephone Note  I connected with Dean [name]. , 02/15/24 2:07 PM by a telemedicine application and verified that I am speaking with the correct person using two identifiers.  Location: Patient: home Provider: home   I discussed the limitations of evaluation and management by telemedicine and the availability of in person appointments. The patient expressed understanding and agreed to proceed.   Shared Decision Making Visit Lung Cancer Screening Program [phone])   Eligibility: 78 y.o. Pack Years Smoking History Calculation = 84 pack years  (# packs/per year x # years smoked) Recent History of coughing up blood  no Unexplained weight loss? no ( >Than 15 pounds within the last 6 months ) Prior History Lung / other cancer - melanoma  (Diagnosis within the last 5 years already requiring surveillance chest CT Scans). Smoking Status Current Smoker   Visit Components: Discussion included one or more decision making aids. YES Discussion included risk/benefits of screening. YES Discussion included potential follow up diagnostic testing for abnormal scans. YES Discussion included meaning and risk of over diagnosis. YES Discussion included meaning and risk of False Positives. YES Discussion included meaning of total radiation exposure. YES  Counseling Included: Importance of adherence to annual lung cancer LDCT screening. YES Impact of comorbidities on ability to participate in the program. YES Ability and willingness to under diagnostic treatment. YES  Smoking Cessation Counseling: Current Smokers:  Discussed importance of smoking cessation. yes Information about tobacco cessation classes and interventions provided to patient. yes Patient provided with "ticket" for LDCT Scan. yes Symptomatic Patient. NO Diagnosis Code: Tobacco Use Z72.0 Asymptomatic Patient yes  Counseling (Intermediate counseling: > three minutes counseling) U0454   Z12.2-Screening  of respiratory organs Z87.891-Personal history of nicotine dependence   [name] 02/15/24

## 2024-02-19 ENCOUNTER — Inpatient Hospital Stay: Admission: RE | Admit: 2024-02-19 | Payer: Medicare PPO | Source: Ambulatory Visit

## 2024-02-22 ENCOUNTER — Encounter: Payer: Self-pay | Admitting: Acute Care

## 2024-03-06 ENCOUNTER — Other Ambulatory Visit: Payer: Self-pay

## 2024-03-06 ENCOUNTER — Ambulatory Visit
Admission: RE | Admit: 2024-03-06 | Discharge: 2024-03-06 | Disposition: A | Discharge status: Home / Self Care | Source: Ambulatory Visit | Attending: Internal Medicine | Admitting: Internal Medicine

## 2024-03-06 DIAGNOSIS — I714 Abdominal aortic aneurysm, without rupture, unspecified: Secondary | ICD-10-CM

## 2024-03-06 DIAGNOSIS — I739 Peripheral vascular disease, unspecified: Secondary | ICD-10-CM

## 2024-03-06 DIAGNOSIS — Z87891 Personal history of nicotine dependence: Secondary | ICD-10-CM

## 2024-03-06 DIAGNOSIS — Z122 Encounter for screening for malignant neoplasm of respiratory organs: Secondary | ICD-10-CM | POA: Diagnosis not present

## 2024-03-06 DIAGNOSIS — F17210 Nicotine dependence, cigarettes, uncomplicated: Secondary | ICD-10-CM | POA: Diagnosis not present

## 2024-03-12 ENCOUNTER — Ambulatory Visit: Payer: Medicare PPO | Admitting: Internal Medicine

## 2024-03-15 ENCOUNTER — Ambulatory Visit (HOSPITAL_COMMUNITY)
Admission: RE | Admit: 2024-03-15 | Discharge: 2024-03-15 | Disposition: A | Discharge status: Home / Self Care | Payer: Medicare PPO | Source: Ambulatory Visit | Attending: Vascular Surgery | Admitting: Vascular Surgery

## 2024-03-15 ENCOUNTER — Ambulatory Visit (INDEPENDENT_AMBULATORY_CARE_PROVIDER_SITE_OTHER)
Admission: RE | Admit: 2024-03-15 | Discharge: 2024-03-15 | Disposition: A | Discharge status: Home / Self Care | Payer: Medicare PPO | Source: Ambulatory Visit | Attending: Vascular Surgery | Admitting: Vascular Surgery

## 2024-03-15 ENCOUNTER — Ambulatory Visit: Payer: Medicare PPO | Admitting: Physician Assistant

## 2024-03-15 VITALS — BP 193/84 | HR 58 | Temp 98.0°F | Ht 64.0 in | Wt 160.6 lb

## 2024-03-15 DIAGNOSIS — I739 Peripheral vascular disease, unspecified: Secondary | ICD-10-CM | POA: Diagnosis not present

## 2024-03-15 DIAGNOSIS — F17200 Nicotine dependence, unspecified, uncomplicated: Secondary | ICD-10-CM | POA: Diagnosis not present

## 2024-03-15 DIAGNOSIS — I714 Abdominal aortic aneurysm, without rupture, unspecified: Secondary | ICD-10-CM

## 2024-03-15 LAB — VAS US ABI WITH/WO TBI
Left ABI: 0.77
Right ABI: 0.58

## 2024-03-15 NOTE — Progress Notes (Signed)
 HISTORY AND PHYSICAL     CC:  follow up. Requesting Provider:  Geoffry Paradise, MD  HPI: This is a 78 y.o. male who is here today for follow up for PAD & AAA.  Pt has hx of  bilateral common iliac artery stenting.  After left common iliac stent occluded he underwent right to left femoral to femoral bypass by Dr. Hart Rochester 08/31/2011. He has hx of renal artery angiography 02/14/2020 by Dr. Darrick Penna that revealed heavily calcified left renal artery with ~ 50% narrowing but no obvious pressure gradient and right renal artery was widely patent.  Pt was last seen 03/24/2022 and at that time, he had experienced bright red blood per rectum and underwent CT oral contrasted scan of abdomen and pelvis. AAA measured 3.4 cm which is an increase from 3.1 cm by ultrasound several years prior.   The pt returns today for follow up.  He states he did not take his blood pressure medicine this morning.  He states when he takes his pressure at home, it is normally 140's systolic.  He denies any claudication, however, his walking is limited for safety reasons.  He does not feel steady and has cut back on his walking.  He does not have any rest pain or non healing wounds.  He does have some swelling in both legs.    He denies any new severe abdominal or back pain.    He continues to smoke.  He states that things have not been great lately.  He had two dogs that were good company that passed away over the past few months.  He had an appt with his PCP and was going to talk to them about him feeling down, but his uber did not arrive and he did not make the appt.   He is compliant with his asa/statin.   The pt is on a statin for cholesterol management.    The pt is on an aspirin.    Other AC:  none The pt is on CCB, ARB, diuretic for hypertension.  The pt is not on medication for diabetes. Tobacco hx:  current    Past Medical History:  Diagnosis Date   AAA (abdominal aortic aneurysm) (HCC) 2021   followed by dr  Ihor Austin;   incidental finding infrarenal aaa 3.1 x 2.8 per abd/pelvis ct 02-26-20 epic;   last CT  03-10-2022  3.4cm   Arthritis    hips, back, neck    Benign localized prostatic hyperplasia with lower urinary tract symptoms (LUTS)    urologist--- dr Mena Goes   CAD (coronary artery disease) 1994   cardiologist---  dr Rennis Golden;   s/p angioplasty and stenting x3 1994;   nuclear stress test 10-15-2020  low risk, no ischemia, nuclear ef 59%   Chronic stable angina (HCC) 2005   CKD (chronic kidney disease), stage III (HCC)    Complication of anesthesia    awareness during anesthesia;  w/ cholestectomy in 1990s pt stated thinks he stopped breathing   COPD mixed type (HCC)    pulmolonogist--- dr c. young   DDD (degenerative disc disease), cervical    Diverticulosis of colon    Dyspnea    chronic   Edema of both lower extremities    GERD (gastroesophageal reflux disease)    hx of   Hiatal hernia    History of adenomatous polyp of colon    History of kidney stones    History of lower GI bleeding 02/2022   admission in epic;  diverticular bleed , no transfusions   History of melanoma    per pt excision forehead , localized   History of prediabetes    History of tuberculosis 1966   Hyperlipidemia    Hypertension    Neuromuscular disorder (HCC)    nerve issues with back    OSA on CPAP 2000   followed by dr c. young;   sleep study in epic , severe osa;   wears cpap auto set 5 to 20   PAD (peripheral artery disease) (HCC)    vascular--- dr Edilia Bo;   hx bilateral CIA stenting in 1990s;  left occluded, dr Hart Rochester s/p  right to left fem-fem bypass 09/ 2012,  renal artery stenosis and aaa   Parotid mass    followed by dr Pollyann Kennedy  (ENT);  per pt stable   Peripheral vascular disease (HCC)    PVC's (premature ventricular contractions)    Renal artery stenosis (HCC)    02-14-2020  renal angiogram left > right renal stenosis   S/P primary angioplasty with coronary stent 1994    Past Surgical  History:  Procedure Laterality Date   APPENDECTOMY  1974   CATARACT EXTRACTION W/ INTRAOCULAR LENS IMPLANT Bilateral 2019   CHOLECYSTECTOMY, LAPAROSCOPIC     1990s   COLONOSCOPY WITH PROPOFOL  04/27/2022   dr Marina Goodell   CORONARY ANGIOPLASTY WITH STENT PLACEMENT  1994   CYSTOSCOPY WITH INSERTION OF UROLIFT N/A 04/14/2020   Procedure: CYSTOSCOPY WITH INSERTION OF UROLIFT;  Surgeon: Jerilee Field, MD;  Location: [facility];  Service: Urology;  Laterality: N/A;   CYSTOSCOPY WITH STENT PLACEMENT Right 07/07/2020   Procedure: CYSTOSCOPY WITH STENT CHANGE;  Surgeon: Jerilee Field, MD;  Location: [facility];  Service: Urology;  Laterality: Right;   CYSTOSCOPY WITH URETEROSCOPY AND STENT PLACEMENT Bilateral 01/03/2020   Procedure: CYSTOSCOPY WITH RETROGRADE/ URETEROSCOPY AND RIGHT STENT PLACEMENT BLADDER BIOPSY;  Surgeon: Jerilee Field, MD;  Location: [facility];  Service: Urology;  Laterality: Bilateral;  ONLY NEEDS 30 MIN   CYSTOSCOPY WITH URETEROSCOPY AND STENT PLACEMENT Right 04/14/2020   Procedure: CYSTOSCOPY WITH URETEROSCOPY, RETROGRADE  AND STENT REPLACEMENT;  Surgeon: Jerilee Field, MD;  Location: [facility];  Service: Urology;  Laterality: Right;   CYSTOSCOPY/URETEROSCOPY/HOLMIUM LASER/STENT PLACEMENT  05/18/2005   @[facility]  by dr Adelina Mings BYPASS GRAFT  08/31/2011   @[facility]  by dr Hart Rochester;  right to left   ILIAC ARTERY STENT Bilateral    1990s;   bilateral common iliac artery stenting   LAPAROSCOPY ABDOMEN DIAGNOSTIC  12/03/2008   @[facility]  by dr Dwain Sarna;   Lap. lysis adhesions/  open umbilical hernia repair   LUMBAR LAMINECTOMY  09/07/2004   @[facility]  by dr pool   RENAL ANGIOGRAPHY N/A 02/14/2020   Procedure: RENAL ANGIOGRAPHY;  Surgeon: Sherren Kerns, MD;  Location: [facility];  Service: Cardiovascular;  Laterality: N/A;  Bilateral   ROBOT ASSISTED PYELOPLASTY Right 11/20/2020   Procedure: XI  ROBOTIC RIGHT ASSISTED PYELOPLASTY AND STENT PLACEMENT;  Surgeon: Crist Fat, MD;  Location: [facility];  Service: Urology;  Laterality: Right;   TONSILLECTOMY     child   TOTAL HIP ARTHROPLASTY Right 07/15/2020   Procedure: TOTAL HIP ARTHROPLASTY ANTERIOR APPROACH;  Surgeon: Ollen Gross, MD;  Location: [facility];  Service: Orthopedics;  Laterality: Right;    UMBILICAL HERNIA REPAIR  2004    Allergies  Allergen Reactions   Penicillins     Childhood reaction  REACTION: rapid  pulse Did it involve swelling of the face/tongue/throat, SOB, or low BP? Yes Did it involve sudden or severe rash/hives, skin peeling, or any reaction on the inside of your mouth or nose? Unknown Did you need to seek medical attention at a hospital or doctor's office? yes When did it last happen?      childhood  If all above answers are "NO", may proceed with cephalosporin use.    Fish Oil     Other reaction(s): Heart attack symptoms   Other     Oily seafood salmon-- chest pain   Penicillin G     Other reaction(s): Unknown   Mold Extract [Trichophyton] Rash    Current Outpatient Medications  Medication Sig Dispense Refill   albuterol (VENTOLIN HFA) 108 (90 Base) MCG/ACT inhaler Inhale 1 puff into the lungs 2 (two) times daily as needed for wheezing or shortness of breath. 8.5 g 5   antiseptic oral rinse (BIOTENE) LIQD 15 mLs by Mouth Rinse route daily as needed for dry mouth.      aspirin EC 81 MG tablet Take 81 mg by mouth daily. Swallow whole.     Azelastine-Fluticasone 137-50 MCG/ACT SUSP Place 1-2 sprays into both nostrils at bedtime. 23 g 3   Cholecalciferol (VITAMIN D-3 PO) Take 1 capsule by mouth every other day.     diltiazem (DILT-XR) 120 MG 24 hr capsule Take 1 capsule (120 mg total) by mouth daily. 90 capsule 2   docusate sodium (COLACE) 100 MG capsule Take 100 mg by mouth daily.     esomeprazole (NEXIUM) 40 MG capsule Take 40 mg by mouth daily as needed (acid reflux/indigestion.).      finasteride (PROSCAR) 5 MG tablet Take 1 tablet by mouth at bedtime.     furosemide (LASIX) 20 MG tablet Take 1 tablet (20 mg total) by mouth daily as needed. 30 tablet 2   irbesartan (AVAPRO) 150 MG tablet Take 150 mg by mouth every evening.     isosorbide mononitrate (IMDUR) 60 MG 24 hr tablet Take 60 mg by mouth every evening.     loperamide (IMODIUM A-D) 2 MG tablet Take 2 mg by mouth as needed.     MAGNESIUM-OXIDE 400 (240 Mg) MG tablet Take 400 mg by mouth every other day.     Menthol, Topical Analgesic, (BLUE-EMU MAXIMUM STRENGTH EX) Apply 1 application topically 3 (three) times daily as needed (pain.).     Polyethyl Glycol-Propyl Glycol (SYSTANE ULTRA OP) Place 1 drop into both eyes daily as needed (Dry eye).     potassium chloride (KLOR-CON M) 10 MEQ tablet Take 1 tablet on the days Lasix is taken. 30 tablet 2   rosuvastatin (CRESTOR) 10 MG tablet Take 10 mg by mouth daily.     simethicone (MYLICON) 125 MG chewable tablet Chew 125 mg by mouth every 6 (six) hours as needed for flatulence. Takes gas x     Tiotropium Bromide-Olodaterol (STIOLTO RESPIMAT) 2.5-2.5 MCG/ACT AERS Inhale 2 puffs into the lungs daily. 4 g 12   traMADol (ULTRAM) 50 MG tablet Take 1 tablet by mouth 4 (four) times daily as needed.     No current facility-administered medications for this visit.    Family History  Problem Relation Age of Onset   Other Father        ETOH   Diabetes Father    Hypertension Father    Kidney cancer Mother    Cancer Mother        renal   Hyperlipidemia Mother  Hypertension Mother    Cancer Sister        breast   Hyperlipidemia Sister    Hypertension Sister    Breast cancer Sister    Hyperlipidemia Brother    Hypertension Brother    Prostate cancer Brother    Colon cancer Neg Hx    Colon polyps Neg Hx    Esophageal cancer Neg Hx    Rectal cancer Neg Hx    Stomach cancer Neg Hx     Social History   Socioeconomic History   Marital status: Single    Spouse name: Not  on file   Number of children: Not on file   Years of education: Not on file   Highest education level: Not on file  Occupational History   Occupation: retired Runner, 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  Tobacco Use   Smoking status: Every Day    Current packs/day: 1.50    Average packs/day: 1.5 packs/day for 51.0 years (76.5 ttl pk-yrs)    Types: Cigarettes    Passive exposure: Never   Smokeless tobacco: Never  Vaping Use   Vaping status: Never Used  Substance and Sexual Activity   Alcohol use: Not Currently    Alcohol/week: 0.0 standard drinks of alcohol   Drug use: Never   Sexual activity: Not on file  Other Topics Concern   Not on file  Social History Narrative   Travelled to [place] and [place] 2008, [place] 2009   Social Drivers of Corporate investment banker Strain: Not on [employer] Insecurity: Not on file  Transportation Needs: Not on file  Physical Activity: Not on file  Stress: Not on file  Social Connections: Not on file  Intimate Partner Violence: Not on file     REVIEW OF SYSTEMS:   [X]  denotes positive finding, [ ]  denotes negative finding Cardiac  Comments:  Chest pain or chest pressure:    Shortness of breath upon exertion:    Short of breath when lying flat:    Irregular heart rhythm:        Vascular    Pain in calf, thigh, or hip brought on by ambulation:    Pain in feet at night that wakes you up from your sleep:     Blood clot in your veins:    Leg swelling:         Pulmonary    Oxygen at home:    Productive cough:     Wheezing:         Neurologic    Sudden weakness in arms or legs:     Sudden numbness in arms or legs:     Sudden onset of difficulty speaking or slurred speech:    Temporary loss of vision in one eye:     Problems with dizziness:         Gastrointestinal    Blood in stool:     Vomited blood:         Genitourinary    Burning when urinating:     Blood in urine:        Psychiatric    Major depression:          Hematologic    Bleeding problems:    Problems with blood clotting too easily:        Skin    Rashes or ulcers:        Constitutional    Fever or chills:      PHYSICAL EXAMINATION:  Today's  Vitals   03/15/24 1106  BP: (!) 193/84  Pulse: (!) 58  Temp: 98 F (36.7 C)  TempSrc: Temporal  SpO2: 94%  Weight: 160 lb 9.6 oz (72.8 kg)  Height: 5\' 4"  (1.626 m)  PainSc: 4    Body mass index is 27.57 kg/m.   General:  WDWN in NAD; vital signs documented above Gait: Not observed HENT: WNL, normocephalic Pulmonary: normal non-labored breathing , without wheezing Cardiac: regular HR, without carotid bruits Abdomen: soft, NT; aortic pulse is not palpable Skin: without rashes Vascular Exam/Pulses:  Right Left  Radial 2+ (normal) 2+ (normal)  Femoral 2+ (normal) 2+ (normal)  DP absent monophasic  PT Brisk doppler Brisk doppler  Peroneal Brisk multiphasic Brisk doppler   Extremities: without ischemic changes, without Gangrene , without cellulitis; without open wounds Musculoskeletal: no muscle wasting or atrophy  Neurologic: A&O X 3 Psychiatric:  The pt has Normal affect.   Non-Invasive Vascular Imaging:   ABI's/TBI's on 03/15/2024: Right:  0.58/0 - Great toe pressure: 0 Left:  0.77/0.19 - Great toe pressure: 41  Arterial duplex on 03/15/2024: Abdominal Aorta Findings:  +-----------+-------+----------+----------+--------+--------+--------+  Location  AP (cm)Trans (cm)PSV (cm/s)WaveformThrombusComments  +-----------+-------+----------+----------+--------+--------+--------+  Proximal  2.07   2.51      113                                 +-----------+-------+----------+----------+--------+--------+--------+  Mid       2.20   2.05      38                                  +-----------+-------+----------+----------+--------+--------+--------+  Distal    3.42   3.14      35                                   +-----------+-------+----------+----------+--------+--------+--------+  RT CIA Prox0.6    0.7       134                                 +-----------+-------+----------+----------+--------+--------+--------+   Summary:  Abdominal Aorta: There is evidence of abnormal dilatation of the distal  Abdominal aorta. The largest aortic diameter remains essentially unchanged compared to prior exam. Previous diameter measurement was obtained on 03/10/22 by CTA.   Previous ABI's/TBI's on 03/24/2022: Right:  0.83/0.30 - Great toe pressure: 43 Left:  0.82/0.47 - Great toe pressure:  67     ASSESSMENT/PLAN:: 78 y.o. male here for follow up for PAD with hx of bilateral common iliac artery stenting.  After left common iliac stent occluded he underwent right to left femoral to femoral bypass by Dr. Hart Rochester 08/31/2011. He has hx of renal artery angiography 02/14/2020 by Dr. Darrick Penna that revealed heavily calcified left renal artery with ~ 50% narrowing but no obvious pressure gradient and right renal artery was widely patent.  PAD -the right ABI has decreased and now toe pressure is zero.  The left is essentially unchanged.  He does not have claudication, rest pain or non healing wounds.  I discussed these with him.  He has not had a duplex of the fem fem bypass in a while.  I am going to bring him back in 3 months for fem  fem duplex and repeat ABI.  Discussed with him to protect his feet and if he develops any non healing wounds or rest pain to contact us sooner.  -continue asa/statin  AAA -AAA remains stable at 3.4cm.  he does not have any severe abdominal or back pain.   -f/u in one year with AAA duplex.  He knows to call 911 should he develop sudden severe back or abdominal pain. -his BP was elevated today.  He has not taken his medication.  He will take this when he gets home and I have asked him to check it daily to make sure it is better controlled.  Current smoker -discussed importance of smoking  cessation and continuing to smoke puts him at risk for limb loss, MI, stroke and cancers.  He is not currently interested in quitting.   He will make appt with PCP for follow up and discussions about feeling down since he cannot do the things that he used to do. He did have an appt but was unable to make it due to [place] not arriving to take him.    Doreatha Massed, [facility] [phone]  Clinic MD:   Hetty Blend

## 2024-03-18 ENCOUNTER — Other Ambulatory Visit: Payer: Self-pay

## 2024-03-18 DIAGNOSIS — I739 Peripheral vascular disease, unspecified: Secondary | ICD-10-CM

## 2024-03-27 ENCOUNTER — Ambulatory Visit (HOSPITAL_BASED_OUTPATIENT_CLINIC_OR_DEPARTMENT_OTHER): Payer: Medicare PPO | Attending: Internal Medicine | Admitting: Internal Medicine

## 2024-03-27 ENCOUNTER — Encounter: Payer: Self-pay | Admitting: Internal Medicine

## 2024-03-27 DIAGNOSIS — G4733 Obstructive sleep apnea (adult) (pediatric): Secondary | ICD-10-CM | POA: Diagnosis not present

## 2024-03-27 DIAGNOSIS — R0902 Hypoxemia: Secondary | ICD-10-CM | POA: Insufficient documentation

## 2024-03-30 DIAGNOSIS — G4733 Obstructive sleep apnea (adult) (pediatric): Secondary | ICD-10-CM | POA: Diagnosis not present

## 2024-03-30 NOTE — Procedures (Signed)
 Maryan Smalling [facility] [address] [place], [place] [address] Tel: [phone]   Fax: [phone]  Polysomnography Interpretation  Patient Name:  Dean [name], Dean [name] Study Date:  03/27/2024 Referring Physician:  Dr. Rosa College  Indications for Polysomnography The patient is a 78 year old Male who is 5\' 4"  and weighs 160.0 lbs. Her BMI equals 27.7.  A full night polysomnogram was performed to evaluate for -reassess OSA after weight loss.  Medication  None reported   Polysomnogram Data A full night polysomnogram recorded the standard physiologic parameters including EEG, EOG, EMG, EKG, nasal and oral airflow.  Respiratory parameters of chest and abdominal movements were recorded with Respiratory Inductance Plethysmography belts.  Oxygen saturation was recorded by pulse oximetry.   Sleep Architecture The total recording time of the polysomnogram was 400.2 minutes.  The total sleep time was 270.0 minutes.  The patient spent 8.9% of total sleep time in Stage N1, 64.3% in Stage N2, 8.9% in Stages N3, and 18.0% in REM.  Sleep latency was 17.0 minutes.  REM latency was 42.5 minutes.  Sleep Efficiency was 67.5%.  Wake after Sleep Onset time was 113.0 minutes.  Respiratory Events The polysomnogram revealed a presence of - obstructive, - central, and - mixed apneas resulting in an Apnea index of - events per hour.  There were 5 hypopneas (>=3% desaturation and/or arousal) resulting in an Apnea\Hypopnea Index (AHI >=3% desaturation and/or arousal) of 1.1 events per hour.  There were 2 hypopneas (>=4% desaturation) resulting in an Apnea\Hypopnea Index (AHI >=4% desaturation) of 0.4 events per hour.  There were 33 Respiratory Effort Related Arousals resulting in a RERA index of 7.3 events per hour. The Respiratory Disturbance Index is 8.4 events per hour.  The snore index was - events per hour.  Mean oxygen saturation was 91.0%.  The lowest oxygen saturation during  sleep was 82.0%.  Time spent <=88% oxygen saturation was 71.8 minutes (18.5%). Supplemental O2 at 1L added per protocol at 2346 for sustained O2 saturation 88% and below.  End Tidal CO2 during sleep ranged from - to - mmHg. End Tidal CO2 was greater than 50 mmHg for - minutes and greater than 55 mmHg for - minutes.  Limb Activity There were 66 total limb movements recorded, of this total, 66 were classified as PLMs.  PLM index was 14.7 per hour and PLM associated with Arousals index was 0.7 per hour.  Cardiac Summary The average pulse rate was 59.2 bpm.  The minimum pulse rate was 39.0 bpm while the maximum pulse rate was 79.0 bpm.  Cardiac rhythm was normal, with PVCs.  Comment- Occasional apneas and hypopneas, AHI (4%) was 0.4/hr. Snoring with oxygen desaturation to a nadir of 82%. Nocturnal hypoxemia addressed with O2 1L supplement.  Diagnosis: Nocturnal Hypoxemia  Recommendations: Assess need for supplemental O2 during sleep.   This study was personally reviewed and electronically signed by: Dr. Rosa College Accredited Board Certified in Sleep Medicine Date/Time: 01/31/24  10:39        Diagnostic PSG Report  Patient Name: Dean [name], Dean [name] Study Date: 03/27/2024  Date of Birth: [birth year] Study Type: Diagnostic  Age: 10 year MRN #: [id number]  Sex: Male Interpreting Physician: Rosa College [id number]  Height: 5\' 4"  Referring Physician: Dr. Rosa College  Weight: 160.0 lbs Recording Tech: Charlsie Cool CRT RPSGT RST  BMI: 27.7 Scoring Tech: Charlsie Cool CRT RPSGT RST  ESS: 12 Neck Size: 15.5  Mask Type RESMED AIRFIT P30i NASAL PILLOWS  Mask Size: MEDIUM Supplemental O2: 1 LPM   Study Overview  Lights Off: 10:06:03 PM  Count Index  Lights On: 04:46:16 AM Awakenings: 20 4.4  Time in Bed: 400.2 min. Arousals: 58 12.9  Total Sleep Time: 270.0 min. AHI (>=3% Desat and/or Ar.): 5 1.1   Sleep Efficiency: 67.5% AHI (>=4% Desat): 2 0.4   Sleep Latency: 17.0 min.  Limb Movements: 66 14.7  Wake After Sleep Onset: 113.0 min. Snore: - -  REM Latency from Sleep Onset: 42.5 min. Desaturations: 21 4.7     Minimum SpO2 TST: 82.0%    Sleep Architecture  % of Time in Bed Stages Time (mins) % Sleep Time  Wake 130.5   Stage N1 24.0 8.9%  Stage N2 173.5 64.3%  Stage N3 24.0 8.9%  REM 48.5 18.0%   Arousal Summary   NREM REM Sleep Index  Respiratory Arousals 26 1 27  6.0  PLM Arousals 3 - 3 0.7  Isolated Limb Movement Arousals - - - -  Snore Arousals - - - -  Spontaneous Arousals 22 6 28  6.2  Total 51 7 58 12.9   Limb Movement Summary   Count Index  Isolated Limb Movements - -  Periodic Limb Movements (PLMs) 66 14.7  Total Limb Movements 66 14.7    Respiratory Summary   By Sleep Stage By Body Position Total   NREM REM Supine Non-Supine   Time (min) 221.5 48.5 1.5 268.5 270.0         Obstructive Apnea - - - - -  Mixed Apnea - - - - -  Central Apnea - - - - -  Total Apneas - - - - -  Total Apnea Index - - - - -         Hypopneas (>=3% Desat and/or Ar.) 5 - 2 3 5   AHI (>=3% Desat and/or Ar.) 1.4 - 80.0 0.7 1.1         Hypopneas (>=4% Desat) 2 - 2 - 2  AHI (>=4% Desat) 0.5 - 80.0 - 0.4          RERAs 32 1 1 32 33  RERA Index 8.7 1.2 40.0 7.2 7.3         RDI 10.0 1.2 120.0 7.8 8.4    Respiratory Event Type Index  Central Apneas -  Obstructive Apneas -  Mixed Apneas -  Central Hypopneas -  Obstructive Hypopneas -  Central Apnea + Hypopnea (CAHI) -  Obstructive Apnea + Hypopnea (OAHI) 1.1   Respiratory Event Durations   Apnea Hypopnea   NREM REM NREM REM  Average (seconds) - - 27.6 -  Maximum (seconds) - - 34.6 -    Oxygen Saturation Summary   Wake NREM REM TST TIB  Average SpO2 (%) 92.7% 90.3% 90.4% 90.3% 91.0%  Minimum SpO2 (%) 82.0% 83.0% 82.0% 82.0% 82.0%  Maximum SpO2 (%) 96.0% 97.0% 96.0% 97.0% 97.0%   Oxygen Saturation Distribution  Range (%) Time in range (min) Time in range (%)  90.0 - 100.0 292.5 75.4%   80.0 - 90.0 94.8 24.5%  70.0 - 80.0 - -  60.0 - 70.0 - -  50.0 - 60.0 - -  0.0 - 50.0 - -  Time Spent <=88% SpO2  Range (%) Time in range (min) Time in range (%)  0.0 - 88.0 71.8 18.5%      Count Index  Desaturations 21 4.7    Cardiac Summary   Wake NREM REM Sleep Total  Average Pulse Rate (  BPM) 57.3 59.7 61.3 60.0 59.2  Minimum Pulse Rate (BPM) 39.0 47.0 53.0 47.0 39.0  Maximum Pulse Rate (BPM) 79.0 70.0 72.0 72.0 79.0   Pulse Rate Distribution:  Range (bpm) Time in range (min) Time in range (%)  0.0 - 40.0 0.0 0.0%  40.0 - 60.0 231.3 59.7%  60.0 - 80.0 155.6 40.1%  80.0 - 100.0 - -  100.0 - 120.0 - -  120.0 - 140.0 - -  140.0 - 200.0 - -   EtCO2 Summary  Stage Min (mmHg) Average (mmHg) Max (mmHg)  Wake - - -  NREM(1+2+3) - - -  REM - - -   EtCO2 Distribution:  Range (mmHg) Time in range (min) Time in range (%)  20.0 - 40.0 - -  40.0 - 50.0 - -  50.0 - 100.0 - -  55.0 - 100.0 - -  Excluded data <20.0 & >65.0 400.5 100.0%   Supplemental O2 Summary  O2 Level Time (min) Wake (min) NREM (min) REM (min) Sleep Eff% OA# CA# MA# Hyp# (>=3%) AHI (>=3%) Hyp# (>=4%) AHI (>=%4) RERA RDI SpO2 <=88% (min) Min SpO2 Mean SpO2 Ar. Index  - 100.0 26.0 64.5 9.5 74.0% - - - - - -  - 3  2.4  66.3 82.0 85.4 7.3  O2: 1 300.5 104.5 157.0 39.0 65.2% - - - 5 1.5 2  0.6 30  10.7  0.0 89.0 92.2 15.0   Hypnograms                         Technologist Comments  THE 56-YEAR-OLD MALE PATIENT PRESENTED TO THE SLEEP DISORDER CENTER FOR A SPLIT NIGHT STUDY WITH A CHIEF COMPLAINT OF OSA. NO BEDTIME MEDICATIONS WERE SELF ADMINISTERED. THE PATIENT WAS FITTED WITH A MEDIUM RESMED AIRFIT P30i NASAL PILLOW MASK FOR PRE-CPAP TRIAL, WHICH WAS TOLERATED WELL. THE LEAD PLACEMENT WAS INITIATED, THEN THE STUDY WAS BEGUN. LOUD AUDIBLE SNORING WAS NOTED THROUGHOUT THE STUDY. SUPPLEMENTAL OXYGEN AT 1 LPM VIA NASAL CANNULA WAS APPLIED AT 2346 FOR SATURATIONS 88% AND BELOW, PER  OXYGEN PROTOCOL. PLMs - PLMAs WERE NOTED. ONE RESTROOM VISITS WAS MADE. FREQUENT CARDIAC ARRHYTHMIAS WERE OBSERVED. SEE ATTACHED PAGES. SPLIT NIGHT CRITERIA WAS NOT MET DUE TO INSUFFICIENT AHI, THEREFORE A NPSG DIAGNOSTIC STUDY WAS CONTINUED. THE PATIENT TOLERATED THE NPSG STUDY WELL. -Tech                           Jolee Naval, Biomedical engineer of Sleep Medicine  ELECTRONICALLY SIGNED ON:  03/30/2024, 10:31 AM [facility] PH: [phone]   FX: [phone] ACCREDITED BY THE AMERICAN ACADEMY OF SLEEP MEDICINE

## 2024-04-04 ENCOUNTER — Ambulatory Visit: Payer: Medicare PPO | Admitting: Internal Medicine

## 2024-04-15 ENCOUNTER — Other Ambulatory Visit: Payer: Self-pay

## 2024-04-15 DIAGNOSIS — Z87891 Personal history of nicotine dependence: Secondary | ICD-10-CM

## 2024-04-15 DIAGNOSIS — Z122 Encounter for screening for malignant neoplasm of respiratory organs: Secondary | ICD-10-CM

## 2024-04-15 DIAGNOSIS — F17210 Nicotine dependence, cigarettes, uncomplicated: Secondary | ICD-10-CM

## 2024-05-26 ENCOUNTER — Other Ambulatory Visit: Payer: Self-pay | Admitting: Internal Medicine

## 2024-06-02 NOTE — Progress Notes (Signed)
 Patient ID: Dean [name], male    DOB: 28-Mar-1946, 78 y.o.   MRN: [id number]  HPI M  Smoker followed for COPD, obstructive sleep apnea, lung nodule, allergic rhinitis. NPSG 04/06/1999   AHI 87/hr, desaturation to 83%, body weight 185 pounds PFT- /04/2009-severe obstructive airways disease with insignificant response to bronchodilator. FVC 3.18/83%, FEV1 1.53/56%, ratio 0.48, TLC 86%, DLCO 51% Office Spirometry 03/20/2017-very severe obstructive airways disease. 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 No concerns Still smoking. Stays up late, then sleeps ok.  Hosp for GIB in March. Breathing no better and understands role of smoking. Preferred [employer] over [employer]. 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 Heart size is normal. No pneumothorax or pleural effusion. Aortic atherosclerosis. No acute or focal bony abnormality. IMPRESSION: No acute disease.   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  History of Present Illness   The patient, with a history of sleep apnea, presents with issues using his CPAP machine. He reports that he is only able to use the machine for about an hour each night, if at all. He speculates that he may be experiencing nasal congestion, which could be affecting the machine's efficacy. The patient also mentions recent kidney procedures, during which he was put under anesthesia. He expresses concern that his cognitive function has been impaired since the procedures.  In addition to the CPAP machine issues, the patient reports increased daytime sleepiness, often falling asleep in front of the television. He also mentions a history of smoking, currently smoking just over a pack a day. The patient has a history of depression and recently lost his last pet dog. He expresses reluctance to get another pet due to his physical limitations.     CXR 07/01/22 IMPRESSION: No active cardiopulmonary disease.  Assessment and Plan:    Obstructive Sleep Apnea Patient reports difficulty with CPAP use, possibly due to nasal congestion. CPAP machine is old and may not be functioning properly. Patient's weight has decreased since the original sleep study. -Schedule a new sleep study given the age of the previous study and changes in patient's weight and symptoms. -Then we will order replacement machine  Tobacco Use Patient continues to smoke over a pack a day. -Encourage continued efforts to reduce and ultimately quit smoking. -Consider low-dose CT screening for early detection of lung cancer, pending eligibility assessment.  Depression Patient reports feeling depressed, recently lost a pet, and has limited social support. -Consider mental health evaluation and support as needed.  General Health Maintenance -Continue to monitor respiratory symptoms and cardiovascular health.     06/03/24- Virtual Visit via Video Note  I connected  with Dean J Mcferran  Jr. on 06/04/24 at 11:00 AM EDT by a video enabled telemedicine application and verified that I am speaking with the correct person using two identifiers.  Location: Patient: home Provider: office   I discussed the limitations of evaluation and management by telemedicine and the availability of in person appointments. The patient expressed understanding and agreed to proceed.  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 Lung-RADS 1, negative. Continue annual screening with low-dose chest CT without contrast in 12 months. 2. Aortic Atherosclerosis (ICD10-I70.0) and Emphysema (ICD10-J43.9). Coronary artery atherosclerosis. Discussed the use of AI scribe software for clinical note transcription with the patient, who gave verbal consent to proceed.  History of Present Illness   Dean [name] is a 78 year old male who presents with concerns about CPAP usage and medication refills.  He has discontinued regular use of his CPAP machine. A sleep study in April showed no significant sleep apnea. He previously felt better using the CPAP and is concerned about changes in the machine's settings affecting its performance. He experienced an episode where he felt no air pressure from the CPAP, prompting him to stop using it.  He is not using home oxygen  currently. During the sleep study, one liter of oxygen  was added. He was advised to sleep on his back during the study, which was not his usual habit.  He is experiencing issues with blood pressure  management and has difficulty obtaining medication refills due to insurance and pharmacy problems. He needs a refill for Stiolto, which he has been without for some time.      Assessment and Plan:    COPD mixed type - refill Stiolto -overnight oximetry on room air   Obstructive sleep apnea. No longer demonstrated on latest NPSG in April, 2025. - Ok to sleep without CPAP -check overnight oximetry    History of Present Illness  Observations/Objective:   Assessment and Plan:   Follow Up Instructions:    I discussed the assessment and treatment plan with the patient. The patient was provided an opportunity to ask questions and all were answered. The patient agreed with the plan and demonstrated an understanding of the instructions.   The patient was advised to call back or seek an in-person evaluation if the symptoms worsen or if the condition fails to improve as anticipated.  I provided 15 minutes of non-face-to-face time during this encounter.   Dean Salt, MD -----------------------------------------------------------------------------------------------  ROS-see HPI  + = positive Constitutional:  +   weight loss, night sweats, fevers, chills, f+atigue, lassitude. HEENT:   No-  headaches, difficulty swallowing, tooth/dental problems, sore throat,       No-  sneezing, itching, ear ache, +nasal congestion, post nasal drip,  CV:  No-   chest pain, orthopnea, PND, swelling in lower extremities, anasarca,  dizziness, palpitations Resp: + shortness of breath with exertion or at rest.           +   productive cough,  + non-productive cough,  No- coughing up of blood.  No-   change in color of mucus.  + wheezing.   Skin: No-   rash or lesions. GI:  No-   heartburn, indigestion, abdominal pain, nausea, vomiting, + diarrhea GU:  MS:  + joint pain or swelling, + back pain . Neuro-     nothing unusual Psych:  No- change in mood or affect. No depression or anxiety.  No  memory loss.  Objective:  OBJ- Physical Exam General- Alert, Oriented, Affect-appropriate, Distress- none acute.   +rolling walker Skin- rash-none, lesions- none, excoriation- none Lymphadenopathy- none Head- atraumatic            Eyes- Gross vision intact, PERRLA, conjunctivae and secretions clear            Ears- Hearing, canals-normal            Nose- + Turbinate edema, no-Septal dev, mucus, polyps, erosion, perforation             Throat- Mallampati III , mucosa clear , drainage- none, tonsils- atrophic Neck- flexible , trachea midline, no stridor , thyroid nl, carotid no bruit, + mass left submandibular is less evident Chest - symmetrical excursion , unlabored           Heart/CV- RRR , no murmur , no gallop  , no rub, nl s1 s2                           - JVD- none , edema- none, stasis changes- none, varices- none           Lung- + diminished/few crackles, wheeze- none,  Cough+light, dullness-none, rub- none           Chest wall-  Abd-  Br/ Gen/ Rectal- Not done, not indicated Extrem- cyanosis- none, clubbing, none, atrophy- none, strength- nl, + rolling walker Neuro- grossly intact to observation

## 2024-06-04 ENCOUNTER — Ambulatory Visit: Admitting: Internal Medicine

## 2024-06-04 ENCOUNTER — Telehealth (INDEPENDENT_AMBULATORY_CARE_PROVIDER_SITE_OTHER): Admitting: Internal Medicine

## 2024-06-04 ENCOUNTER — Encounter: Payer: Self-pay | Admitting: Internal Medicine

## 2024-06-04 DIAGNOSIS — G4734 Idiopathic sleep related nonobstructive alveolar hypoventilation: Secondary | ICD-10-CM | POA: Diagnosis not present

## 2024-06-04 DIAGNOSIS — F17210 Nicotine dependence, cigarettes, uncomplicated: Secondary | ICD-10-CM

## 2024-06-04 MED ORDER — STIOLTO RESPIMAT 2.5-2.5 MCG/ACT IN AERS: 2.0000 | INHALATION_SPRAY | Freq: Every day | RESPIRATORY_TRACT | 12 refills | Status: DC

## 2024-06-04 NOTE — Patient Instructions (Addendum)
 Order- schedule overnight oximetry on room air    dx Nocturnal Hypoxemia  You can stop using CPAP. The latest sleep study shows that you don't need it.  Stiolto refilled

## 2024-06-06 ENCOUNTER — Ambulatory Visit (HOSPITAL_COMMUNITY)

## 2024-06-06 ENCOUNTER — Ambulatory Visit

## 2024-06-20 DIAGNOSIS — G4730 Sleep apnea, unspecified: Secondary | ICD-10-CM | POA: Diagnosis not present

## 2024-06-20 DIAGNOSIS — R0902 Hypoxemia: Secondary | ICD-10-CM | POA: Diagnosis not present

## 2024-08-07 ENCOUNTER — Other Ambulatory Visit: Payer: Self-pay | Admitting: Internal Medicine

## 2024-08-26 ENCOUNTER — Telehealth: Payer: Self-pay | Admitting: *Deleted

## 2024-08-26 NOTE — Telephone Encounter (Signed)
 Patient never received results of ONO performed in July. I don't see a DME company listed. Can someone help tract this down?

## 2024-08-26 NOTE — Telephone Encounter (Signed)
Duplicate- closing message

## 2024-08-26 NOTE — Telephone Encounter (Signed)
 last sleep study 4/25 Pt had televisit with CY 06/04/24 and per vist AVS patient was told he could d/c cpap therapy. 06/20/24.CY ordered  NONINVASIVE EAR/PULSE OXIMETRY OVERNIGHT MONITOR No results found in chart.  LMTCB for more details of what's needed.

## 2024-08-27 ENCOUNTER — Telehealth: Payer: Self-pay

## 2024-08-27 NOTE — Telephone Encounter (Signed)
 Paper work has been received by [employer] / made copies to go to scan other copy went to Dr. Neysa to review.

## 2024-08-27 NOTE — Telephone Encounter (Signed)
 Copied from CRM [id number]. Topic: Clinical - Lab/Test Results >> Aug 26, 2024  2:55 PM Isabell A wrote: Reason for CRM: Patient is requesting a call back for his sleep study results.   Callback number: [phone] please call around 10 am or leave a message on his voicemail. >> Aug 27, 2024 11:43 AM Leila BROCKS wrote: Patient [phone] is returning the office call on overnight oxygen  level report. Per CAL, see 08/27/24 telephone encounter, and [facility] will contact patient back. Please call back when received.     Spoke w/ PT let him know that we had to reach out to the DME to get the info and as soon as the provider reviews it someone will reach out w/ the Results

## 2024-08-27 NOTE — Telephone Encounter (Signed)
 [facility] now has the ONO results. NFN

## 2024-08-27 NOTE — Telephone Encounter (Signed)
 It was received by [employer] on 6/18 I have sent as message in regards to the results. Will inform once I hear.

## 2024-08-27 NOTE — Telephone Encounter (Signed)
 Called patient.  Reviewed information per Dr. Linder Revere.  Patient verbalized understanding.

## 2024-08-27 NOTE — Telephone Encounter (Signed)
 Overnight oximetry ([employer]-06/20/24) shows only 2 minutes and 15 seconds with low oxygen  during sleep. Ok for now to continue without home oxygen . This can be checked again at a later time.

## 2024-08-27 NOTE — Telephone Encounter (Signed)
 Dr. Neysa, ONO on room air results from 06/20/2024 are on your desk in [facility].  Please review.  Thank you.

## 2024-09-05 ENCOUNTER — Encounter: Payer: Self-pay | Admitting: Internal Medicine

## 2024-11-29 ENCOUNTER — Telehealth (HOSPITAL_COMMUNITY): Payer: Self-pay | Admitting: Pharmacy Technician

## 2024-11-29 ENCOUNTER — Emergency Department (HOSPITAL_COMMUNITY)

## 2024-11-29 ENCOUNTER — Other Ambulatory Visit: Payer: Self-pay

## 2024-11-29 ENCOUNTER — Inpatient Hospital Stay (HOSPITAL_COMMUNITY)
Admission: EM | Admit: 2024-11-29 | Discharge: 2024-12-19 | DRG: 175 | Disposition: E | Attending: Internal Medicine | Admitting: Internal Medicine

## 2024-11-29 ENCOUNTER — Other Ambulatory Visit (HOSPITAL_COMMUNITY): Payer: Self-pay

## 2024-11-29 ENCOUNTER — Inpatient Hospital Stay (HOSPITAL_COMMUNITY)

## 2024-11-29 ENCOUNTER — Encounter (HOSPITAL_COMMUNITY): Payer: Self-pay | Admitting: Internal Medicine

## 2024-11-29 DIAGNOSIS — F17210 Nicotine dependence, cigarettes, uncomplicated: Secondary | ICD-10-CM | POA: Diagnosis present

## 2024-11-29 DIAGNOSIS — E785 Hyperlipidemia, unspecified: Secondary | ICD-10-CM | POA: Diagnosis present

## 2024-11-29 DIAGNOSIS — I2694 Multiple subsegmental pulmonary emboli without acute cor pulmonale: Principal | ICD-10-CM | POA: Diagnosis present

## 2024-11-29 DIAGNOSIS — J189 Pneumonia, unspecified organism: Secondary | ICD-10-CM | POA: Diagnosis present

## 2024-11-29 DIAGNOSIS — R54 Age-related physical debility: Secondary | ICD-10-CM | POA: Diagnosis present

## 2024-11-29 DIAGNOSIS — M87051 Idiopathic aseptic necrosis of right femur: Secondary | ICD-10-CM | POA: Insufficient documentation

## 2024-11-29 DIAGNOSIS — Z8051 Family history of malignant neoplasm of kidney: Secondary | ICD-10-CM

## 2024-11-29 DIAGNOSIS — L89321 Pressure ulcer of left buttock, stage 1: Secondary | ICD-10-CM | POA: Diagnosis present

## 2024-11-29 DIAGNOSIS — Z833 Family history of diabetes mellitus: Secondary | ICD-10-CM

## 2024-11-29 DIAGNOSIS — Z8249 Family history of ischemic heart disease and other diseases of the circulatory system: Secondary | ICD-10-CM

## 2024-11-29 DIAGNOSIS — Z7189 Other specified counseling: Secondary | ICD-10-CM | POA: Diagnosis not present

## 2024-11-29 DIAGNOSIS — I739 Peripheral vascular disease, unspecified: Secondary | ICD-10-CM | POA: Diagnosis present

## 2024-11-29 DIAGNOSIS — Z66 Do not resuscitate: Secondary | ICD-10-CM | POA: Diagnosis not present

## 2024-11-29 DIAGNOSIS — D631 Anemia in chronic kidney disease: Secondary | ICD-10-CM | POA: Diagnosis present

## 2024-11-29 DIAGNOSIS — I10 Essential (primary) hypertension: Secondary | ICD-10-CM | POA: Diagnosis present

## 2024-11-29 DIAGNOSIS — G4733 Obstructive sleep apnea (adult) (pediatric): Secondary | ICD-10-CM | POA: Diagnosis present

## 2024-11-29 DIAGNOSIS — I2510 Atherosclerotic heart disease of native coronary artery without angina pectoris: Secondary | ICD-10-CM | POA: Diagnosis present

## 2024-11-29 DIAGNOSIS — M5030 Other cervical disc degeneration, unspecified cervical region: Secondary | ICD-10-CM | POA: Diagnosis present

## 2024-11-29 DIAGNOSIS — Z8349 Family history of other endocrine, nutritional and metabolic diseases: Secondary | ICD-10-CM

## 2024-11-29 DIAGNOSIS — I7143 Infrarenal abdominal aortic aneurysm, without rupture: Secondary | ICD-10-CM | POA: Diagnosis present

## 2024-11-29 DIAGNOSIS — N401 Enlarged prostate with lower urinary tract symptoms: Secondary | ICD-10-CM | POA: Diagnosis present

## 2024-11-29 DIAGNOSIS — R0602 Shortness of breath: Secondary | ICD-10-CM | POA: Diagnosis not present

## 2024-11-29 DIAGNOSIS — I82413 Acute embolism and thrombosis of femoral vein, bilateral: Secondary | ICD-10-CM | POA: Diagnosis present

## 2024-11-29 DIAGNOSIS — I700 Atherosclerosis of aorta: Secondary | ICD-10-CM | POA: Diagnosis present

## 2024-11-29 DIAGNOSIS — L89311 Pressure ulcer of right buttock, stage 1: Secondary | ICD-10-CM | POA: Diagnosis present

## 2024-11-29 DIAGNOSIS — R1310 Dysphagia, unspecified: Secondary | ICD-10-CM | POA: Diagnosis present

## 2024-11-29 DIAGNOSIS — Z9989 Dependence on other enabling machines and devices: Secondary | ICD-10-CM

## 2024-11-29 DIAGNOSIS — Z711 Person with feared health complaint in whom no diagnosis is made: Secondary | ICD-10-CM | POA: Diagnosis not present

## 2024-11-29 DIAGNOSIS — N1831 Chronic kidney disease, stage 3a: Secondary | ICD-10-CM | POA: Diagnosis present

## 2024-11-29 DIAGNOSIS — E876 Hypokalemia: Secondary | ICD-10-CM | POA: Diagnosis present

## 2024-11-29 DIAGNOSIS — F17200 Nicotine dependence, unspecified, uncomplicated: Secondary | ICD-10-CM | POA: Diagnosis present

## 2024-11-29 DIAGNOSIS — I82403 Acute embolism and thrombosis of unspecified deep veins of lower extremity, bilateral: Secondary | ICD-10-CM | POA: Diagnosis not present

## 2024-11-29 DIAGNOSIS — Z955 Presence of coronary angioplasty implant and graft: Secondary | ICD-10-CM

## 2024-11-29 DIAGNOSIS — I82453 Acute embolism and thrombosis of peroneal vein, bilateral: Secondary | ICD-10-CM | POA: Diagnosis present

## 2024-11-29 DIAGNOSIS — I82443 Acute embolism and thrombosis of tibial vein, bilateral: Secondary | ICD-10-CM | POA: Diagnosis present

## 2024-11-29 DIAGNOSIS — K219 Gastro-esophageal reflux disease without esophagitis: Secondary | ICD-10-CM | POA: Diagnosis present

## 2024-11-29 DIAGNOSIS — J440 Chronic obstructive pulmonary disease with acute lower respiratory infection: Secondary | ICD-10-CM | POA: Diagnosis present

## 2024-11-29 DIAGNOSIS — Z85820 Personal history of malignant melanoma of skin: Secondary | ICD-10-CM

## 2024-11-29 DIAGNOSIS — Z86711 Personal history of pulmonary embolism: Secondary | ICD-10-CM | POA: Diagnosis not present

## 2024-11-29 DIAGNOSIS — E162 Hypoglycemia, unspecified: Secondary | ICD-10-CM | POA: Diagnosis not present

## 2024-11-29 DIAGNOSIS — Z9109 Other allergy status, other than to drugs and biological substances: Secondary | ICD-10-CM

## 2024-11-29 DIAGNOSIS — N179 Acute kidney failure, unspecified: Secondary | ICD-10-CM | POA: Diagnosis present

## 2024-11-29 DIAGNOSIS — Z803 Family history of malignant neoplasm of breast: Secondary | ICD-10-CM

## 2024-11-29 DIAGNOSIS — Z79899 Other long term (current) drug therapy: Secondary | ICD-10-CM | POA: Diagnosis not present

## 2024-11-29 DIAGNOSIS — L89611 Pressure ulcer of right heel, stage 1: Secondary | ICD-10-CM | POA: Diagnosis present

## 2024-11-29 DIAGNOSIS — J9601 Acute respiratory failure with hypoxia: Secondary | ICD-10-CM | POA: Diagnosis present

## 2024-11-29 DIAGNOSIS — Z5982 Transportation insecurity: Secondary | ICD-10-CM

## 2024-11-29 DIAGNOSIS — Z8042 Family history of malignant neoplasm of prostate: Secondary | ICD-10-CM

## 2024-11-29 DIAGNOSIS — Z515 Encounter for palliative care: Secondary | ICD-10-CM | POA: Diagnosis not present

## 2024-11-29 DIAGNOSIS — R7989 Other specified abnormal findings of blood chemistry: Secondary | ICD-10-CM | POA: Diagnosis not present

## 2024-11-29 DIAGNOSIS — I252 Old myocardial infarction: Secondary | ICD-10-CM

## 2024-11-29 DIAGNOSIS — L8990 Pressure ulcer of unspecified site, unspecified stage: Secondary | ICD-10-CM | POA: Diagnosis present

## 2024-11-29 DIAGNOSIS — Z880 Allergy status to penicillin: Secondary | ICD-10-CM

## 2024-11-29 DIAGNOSIS — I2699 Other pulmonary embolism without acute cor pulmonale: Secondary | ICD-10-CM

## 2024-11-29 DIAGNOSIS — E8809 Other disorders of plasma-protein metabolism, not elsewhere classified: Secondary | ICD-10-CM | POA: Diagnosis not present

## 2024-11-29 DIAGNOSIS — K5791 Diverticulosis of intestine, part unspecified, without perforation or abscess with bleeding: Secondary | ICD-10-CM | POA: Insufficient documentation

## 2024-11-29 DIAGNOSIS — Z9049 Acquired absence of other specified parts of digestive tract: Secondary | ICD-10-CM

## 2024-11-29 DIAGNOSIS — Z7982 Long term (current) use of aspirin: Secondary | ICD-10-CM

## 2024-11-29 DIAGNOSIS — Z91013 Allergy to seafood: Secondary | ICD-10-CM

## 2024-11-29 DIAGNOSIS — F419 Anxiety disorder, unspecified: Secondary | ICD-10-CM | POA: Diagnosis not present

## 2024-11-29 DIAGNOSIS — J449 Chronic obstructive pulmonary disease, unspecified: Secondary | ICD-10-CM | POA: Diagnosis present

## 2024-11-29 DIAGNOSIS — R531 Weakness: Secondary | ICD-10-CM | POA: Diagnosis present

## 2024-11-29 DIAGNOSIS — R3915 Urgency of urination: Secondary | ICD-10-CM | POA: Diagnosis present

## 2024-11-29 DIAGNOSIS — Z1152 Encounter for screening for COVID-19: Secondary | ICD-10-CM

## 2024-11-29 DIAGNOSIS — Z96641 Presence of right artificial hip joint: Secondary | ICD-10-CM | POA: Diagnosis present

## 2024-11-29 LAB — CBC WITH DIFFERENTIAL/PLATELET
Abs Immature Granulocytes: 0.12 K/uL — ABNORMAL HIGH (ref 0.00–0.07)
Basophils Absolute: 0.1 K/uL (ref 0.0–0.1)
Basophils Relative: 1 %
Eosinophils Absolute: 0.1 K/uL (ref 0.0–0.5)
Eosinophils Relative: 1 %
HCT: 44.6 % (ref 39.0–52.0)
Hemoglobin: 14.5 g/dL (ref 13.0–17.0)
Immature Granulocytes: 1 %
Lymphocytes Relative: 12 %
Lymphs Abs: 1 K/uL (ref 0.7–4.0)
MCH: 31.6 pg (ref 26.0–34.0)
MCHC: 32.5 g/dL (ref 30.0–36.0)
MCV: 97.2 fL (ref 80.0–100.0)
Monocytes Absolute: 0.8 K/uL (ref 0.1–1.0)
Monocytes Relative: 10 %
Neutro Abs: 6.5 K/uL (ref 1.7–7.7)
Neutrophils Relative %: 75 %
Platelets: 202 K/uL (ref 150–400)
RBC: 4.59 MIL/uL (ref 4.22–5.81)
RDW: 14.5 % (ref 11.5–15.5)
WBC: 8.6 K/uL (ref 4.0–10.5)
nRBC: 0 % (ref 0.0–0.2)

## 2024-11-29 LAB — CBG MONITORING, ED: Glucose-Capillary: 72 mg/dL (ref 70–99)

## 2024-11-29 LAB — GLUCOSE, CAPILLARY
Glucose-Capillary: 81 mg/dL (ref 70–99)
Glucose-Capillary: 62 mg/dL — ABNORMAL LOW (ref 70–99)
Glucose-Capillary: 132 mg/dL — ABNORMAL HIGH (ref 70–99)
Glucose-Capillary: 85 mg/dL (ref 70–99)

## 2024-11-29 LAB — COMPREHENSIVE METABOLIC PANEL WITH GFR
ALT: 7 U/L (ref 0–44)
AST: 30 U/L (ref 15–41)
Albumin: 3.5 g/dL (ref 3.5–5.0)
Alkaline Phosphatase: 118 U/L (ref 38–126)
Anion gap: 15 (ref 5–15)
BUN: 15 mg/dL (ref 8–23)
CO2: 29 mmol/L (ref 22–32)
Calcium: 8.9 mg/dL (ref 8.9–10.3)
Chloride: 101 mmol/L (ref 98–111)
Creatinine, Ser: 1.74 mg/dL — ABNORMAL HIGH (ref 0.61–1.24)
GFR, Estimated: 40 mL/min — ABNORMAL LOW (ref >60)
Glucose, Bld: 72 mg/dL (ref 70–99)
Potassium: 2.9 mmol/L — ABNORMAL LOW (ref 3.5–5.1)
Sodium: 146 mmol/L — ABNORMAL HIGH (ref 135–145)
Total Bilirubin: 0.6 mg/dL (ref 0.0–1.2)
Total Protein: 7.2 g/dL (ref 6.5–8.1)

## 2024-11-29 LAB — ANTITHROMBIN III: AntiThromb III Func: 90 % (ref 75–120)

## 2024-11-29 LAB — URINALYSIS, ROUTINE W REFLEX MICROSCOPIC
Bacteria, UA: NONE SEEN
Bilirubin Urine: NEGATIVE
Glucose, UA: NEGATIVE mg/dL
Hgb urine dipstick: NEGATIVE
Ketones, ur: 5 mg/dL — AB
Leukocytes,Ua: NEGATIVE
Nitrite: NEGATIVE
Protein, ur: >=300 mg/dL — AB
Specific Gravity, Urine: 1.013 (ref 1.005–1.030)
pH: 7 (ref 5.0–8.0)

## 2024-11-29 LAB — RESP PANEL BY RT-PCR (RSV, FLU A&B, COVID)  RVPGX2
Influenza A by PCR: NEGATIVE
Influenza B by PCR: NEGATIVE
Resp Syncytial Virus by PCR: NEGATIVE
SARS Coronavirus 2 by RT PCR: NEGATIVE

## 2024-11-29 LAB — PHOSPHORUS: Phosphorus: 3.4 mg/dL (ref 2.5–4.6)

## 2024-11-29 LAB — PRO BRAIN NATRIURETIC PEPTIDE: Pro Brain Natriuretic Peptide: 9331 pg/mL — ABNORMAL HIGH (ref <300.0)

## 2024-11-29 LAB — MRSA NEXT GEN BY PCR, NASAL: MRSA by PCR Next Gen: NOT DETECTED

## 2024-11-29 LAB — TROPONIN T, HIGH SENSITIVITY
Troponin T High Sensitivity: 121 ng/L (ref 0–19)
Troponin T High Sensitivity: 132 ng/L (ref 0–19)

## 2024-11-29 LAB — HEPARIN LEVEL (UNFRACTIONATED): Heparin Unfractionated: 0.66 [IU]/mL (ref 0.30–0.70)

## 2024-11-29 LAB — MAGNESIUM: Magnesium: 1.8 mg/dL (ref 1.7–2.4)

## 2024-11-29 MED ORDER — LABETALOL HCL 5 MG/ML IV SOLN
20.0000 mg | Freq: Once | INTRAVENOUS | Status: AC
  Administered 2024-11-29: 20 mg via INTRAVENOUS

## 2024-11-29 MED ORDER — POTASSIUM CHLORIDE CRYS ER 20 MEQ PO TBCR
40.0000 meq | EXTENDED_RELEASE_TABLET | Freq: Once | ORAL | Status: DC
  Filled 2024-11-29: qty 2

## 2024-11-29 MED ORDER — ONDANSETRON HCL 4 MG PO TABS: 4.0000 mg | ORAL_TABLET | Freq: Four times a day (QID) | ORAL | Status: DC | PRN

## 2024-11-29 MED ORDER — ORAL CARE MOUTH RINSE: 15.0000 mL | OROMUCOSAL | Status: DC | PRN

## 2024-11-29 MED ORDER — MAGNESIUM SULFATE 2 GM/50ML IV SOLN
2.0000 g | Freq: Once | INTRAVENOUS | Status: AC
  Administered 2024-11-29: 2 g via INTRAVENOUS
  Filled 2024-11-29: qty 50

## 2024-11-29 MED ORDER — IOHEXOL 350 MG/ML SOLN
60.0000 mL | Freq: Once | INTRAVENOUS | Status: AC | PRN
  Administered 2024-11-29: 60 mL via INTRAVENOUS

## 2024-11-29 MED ORDER — HEPARIN (PORCINE) 25000 UT/250ML-% IV SOLN
1250.0000 [IU]/h | INTRAVENOUS | Status: AC
  Administered 2024-11-29 – 2024-11-30 (×2): 1100 [IU]/h via INTRAVENOUS
  Administered 2024-12-01: 1000 [IU]/h via INTRAVENOUS
  Administered 2024-12-02: 12:00:00 1100 [IU]/h via INTRAVENOUS
  Administered 2024-12-03: 13:00:00 1250 [IU]/h via INTRAVENOUS
  Filled 2024-11-29 (×5): qty 250

## 2024-11-29 MED ORDER — AMLODIPINE BESYLATE 5 MG PO TABS
5.0000 mg | ORAL_TABLET | Freq: Every day | ORAL | Status: DC
  Administered 2024-12-01 – 2024-12-03 (×3): 5 mg via ORAL
  Filled 2024-11-29 (×3): qty 1

## 2024-11-29 MED ORDER — CHLORHEXIDINE GLUCONATE CLOTH 2 % EX PADS
6.0000 | MEDICATED_PAD | Freq: Every day | CUTANEOUS | Status: DC
  Administered 2024-11-29 – 2024-12-03 (×5): 6 via TOPICAL

## 2024-11-29 MED ORDER — ACETAMINOPHEN 325 MG PO TABS: 650.0000 mg | ORAL_TABLET | Freq: Four times a day (QID) | ORAL | Status: DC | PRN

## 2024-11-29 MED ORDER — HEPARIN BOLUS VIA INFUSION
4000.0000 [IU] | Freq: Once | INTRAVENOUS | Status: AC
  Administered 2024-11-29: 4000 [IU] via INTRAVENOUS
  Filled 2024-11-29: qty 4000

## 2024-11-29 MED ORDER — IPRATROPIUM-ALBUTEROL 0.5-2.5 (3) MG/3ML IN SOLN
3.0000 mL | Freq: Four times a day (QID) | RESPIRATORY_TRACT | Status: DC
  Administered 2024-11-29 – 2024-11-30 (×3): 3 mL via RESPIRATORY_TRACT
  Filled 2024-11-29 (×3): qty 3

## 2024-11-29 MED ORDER — ONDANSETRON HCL 4 MG/2ML IJ SOLN: 4.0000 mg | Freq: Four times a day (QID) | INTRAMUSCULAR | Status: DC | PRN

## 2024-11-29 MED ORDER — POTASSIUM CHLORIDE 10 MEQ/100ML IV SOLN
10.0000 meq | INTRAVENOUS | Status: AC
  Administered 2024-11-29 (×4): 10 meq via INTRAVENOUS
  Filled 2024-11-29 (×4): qty 100

## 2024-11-29 MED ORDER — HYDROMORPHONE HCL 1 MG/ML IJ SOLN
0.5000 mg | INTRAMUSCULAR | Status: AC | PRN
  Administered 2024-12-01 – 2024-12-03 (×4): 0.5 mg via INTRAVENOUS
  Filled 2024-11-29 (×5): qty 1

## 2024-11-29 MED ORDER — ORAL CARE MOUTH RINSE
15.0000 mL | OROMUCOSAL | Status: DC
  Administered 2024-11-29 – 2024-12-01 (×6): 15 mL via OROMUCOSAL

## 2024-11-29 MED ORDER — DEXTROSE 50 % IV SOLN
25.0000 g | INTRAVENOUS | Status: DC | PRN
  Administered 2024-11-29: 25 g via INTRAVENOUS
  Filled 2024-11-29: qty 50

## 2024-11-29 MED ORDER — HYDRALAZINE HCL 20 MG/ML IJ SOLN
10.0000 mg | INTRAMUSCULAR | Status: DC | PRN
  Administered 2024-11-29 – 2024-12-03 (×13): 10 mg via INTRAVENOUS
  Filled 2024-11-29 (×13): qty 1

## 2024-11-29 MED ORDER — SODIUM CHLORIDE 0.9 % IV BOLUS
500.0000 mL | Freq: Once | INTRAVENOUS | Status: AC
  Administered 2024-11-29: 500 mL via INTRAVENOUS

## 2024-11-29 MED ORDER — ACETAMINOPHEN 650 MG RE SUPP: 650.0000 mg | Freq: Four times a day (QID) | RECTAL | Status: DC | PRN

## 2024-11-29 MED ORDER — LABETALOL HCL 5 MG/ML IV SOLN
INTRAVENOUS | Status: AC
  Filled 2024-11-29: qty 4

## 2024-11-29 NOTE — ED Provider Notes (Signed)
  EMERGENCY DEPARTMENT AT [facility] Provider Note   CSN: [id number] Arrival date & time: 11/29/24  9370     Patient presents with: Weakness   Dean [name]. is a 78 y.o. male.  78 year old male presents ED with complaints of progressively worsening weakness for approximately 2 days.  Patient reports this morning he was unable to get out of bed and called EMS for evaluation.  Patient also endorses a productive cough approximately 2 to 3 days with intermittent shortness of breath without obvious exacerbating factor.  Patient reports he is normally able to get around well at home and is not on home oxygen .  He reports he is supposed to be getting a home health aide in the next couple of weeks.  Patient denies chest pain, dizziness, fever, nausea vomiting diarrhea.  Patient has significant history of AAA, hyperlipidemia, peripheral vascular disease, renal artery stenosis, COPD CKD.      Prior to Admission medications  Medication Sig Start Date End Date Taking? Authorizing Provider  albuterol  (VENTOLIN  HFA) 108 (90 Base) MCG/ACT inhaler INHALE 1 PUFF BY MOUTH INTO THE LUNGS TWICE DAILY AS NEEDED FOR WHEEZING OR SHORTNESS OF BREATH 08/08/24   Neysa Rama D, MD  antiseptic oral rinse (BIOTENE) LIQD 15 mLs by Mouth Rinse route daily as needed for dry mouth.     [provider]  aspirin  EC 81 MG tablet Take 81 mg by mouth daily. Swallow whole.    [provider]  Azelastine -Fluticasone  137-50 MCG/ACT SUSP Place 1-2 sprays into both nostrils at bedtime. 05/10/23   Neysa Rama D, MD  Cholecalciferol (VITAMIN D-3 PO) Take 1 capsule by mouth every other day.    [provider]  diltiazem  (DILT-XR) 120 MG 24 hr capsule Take 1 capsule (120 mg total) by mouth daily. 04/13/23   Hilty, Vinie BROCKS, MD  docusate sodium  (COLACE) 100 MG capsule Take 100 mg by mouth daily.    [provider]  esomeprazole (NEXIUM) 40 MG capsule Take 40 mg by  mouth daily as needed (acid reflux/indigestion.). 02/07/19   [provider]  finasteride  (PROSCAR ) 5 MG tablet Take 1 tablet by mouth at bedtime.    [provider]  furosemide  (LASIX ) 20 MG tablet TAKE 1 TABLET(20 MG) BY MOUTH DAILY AS NEEDED 05/28/24   Hilty, Vinie BROCKS, MD  irbesartan  (AVAPRO ) 150 MG tablet Take 150 mg by mouth every evening. 11/11/19   [provider]  isosorbide  mononitrate (IMDUR ) 60 MG 24 hr tablet Take 60 mg by mouth every evening.    [provider]  loperamide  (IMODIUM  A-D) 2 MG tablet Take 2 mg by mouth as needed.    [provider]  MAGNESIUM -OXIDE 400 (240 Mg) MG tablet Take 400 mg by mouth every other day.    [provider]  Menthol , Topical Analgesic, (BLUE-EMU MAXIMUM STRENGTH EX) Apply 1 application topically 3 (three) times daily as needed (pain.).    [provider]  Polyethyl Glycol-Propyl Glycol (SYSTANE ULTRA OP) Place 1 drop into both eyes daily as needed (Dry eye).    [provider]  potassium chloride  (KLOR-CON  M) 10 MEQ tablet Take 1 tablet on the days Lasix  is taken. 04/13/23   Hilty, Vinie BROCKS, MD  rosuvastatin (CRESTOR) 10 MG tablet Take 10 mg by mouth daily.    [provider]  simethicone  (MYLICON) 125 MG chewable tablet Chew 125 mg by mouth every 6 (six) hours as needed for flatulence. Takes gas x  [provider]  Tiotropium Bromide-Olodaterol (STIOLTO RESPIMAT ) 2.5-2.5 MCG/ACT AERS Inhale 2 puffs into the lungs daily. 06/04/24   Neysa Rama D, MD  traMADol  (ULTRAM ) 50 MG tablet Take 1 tablet by mouth 4 (four) times daily as needed. 03/21/22   [provider]    Allergies: Penicillins, Fish oil, Other, Penicillin g, and Mold extract [trichophyton]    Review of Systems  Respiratory:  Positive for cough and shortness of breath.   Neurological:  Positive for weakness.  All other systems reviewed and are negative.   Updated Vital Signs BP (!)  180/88   Pulse 79   Temp (!) 97.4 F (36.3 C) (Axillary)   Resp 20   Ht 5' 5 (1.651 m)   Wt 67.4 kg   SpO2 95%   BMI 24.73 kg/m   Physical Exam Vitals and nursing note reviewed.  Constitutional:      General: He is not in acute distress.    Appearance: Normal appearance. He is not ill-appearing.  HENT:     Head: Normocephalic and atraumatic.     Nose: Nose normal.  Eyes:     Extraocular Movements: Extraocular movements intact.     Conjunctiva/sclera: Conjunctivae normal.     Pupils: Pupils are equal, round, and reactive to light.  Cardiovascular:     Rate and Rhythm: Normal rate.  Pulmonary:     Effort: Pulmonary effort is normal.     Comments: Active cough on evaluation.  Some decreased air movement in lung fields, exam is limited due to patient lying supine.  Patient is on 2 L of oxygen  via nasal cannula satting at 94% and in no acute respiratory distress.  Patient denies shortness of breath at this time. Abdominal:     General: Abdomen is flat. There is no distension.     Palpations: Abdomen is soft.     Tenderness: There is no abdominal tenderness. There is no guarding.  Musculoskeletal:        General: Normal range of motion.     Cervical back: Normal range of motion.  Skin:    General: Skin is warm.     Capillary Refill: Capillary refill takes less than 2 seconds.     Comments: Patient has some erythema noted to the left lower extremity.  No wounds noted and patient reports no recent injuries.  Patient has strong pulses, good cap refill, full range of motion of his lower extremity.  Neurological:     General: No focal deficit present.     Mental Status: He is alert.  Psychiatric:        Mood and Affect: Mood normal.        Behavior: Behavior normal.     (all labs ordered are listed, but only abnormal results are displayed) Labs Reviewed  COMPREHENSIVE METABOLIC PANEL WITH GFR - Abnormal; Notable for the following components:      Result Value   Sodium 146  (*)    Potassium 2.9 (*)    Creatinine, Ser 1.74 (*)    GFR, Estimated 40 (*)    All other components within normal limits  CBC WITH DIFFERENTIAL/PLATELET - Abnormal; Notable for the following components:   Abs Immature Granulocytes 0.12 (*)    All other components within normal limits  URINALYSIS, ROUTINE W REFLEX MICROSCOPIC - Abnormal; Notable for the following components:   Ketones, ur 5 (*)    Protein, ur >=300 (*)    All other components within normal limits  PRO BRAIN  NATRIURETIC PEPTIDE - Abnormal; Notable for the following components:   Pro Brain Natriuretic Peptide 9,331.0 (*)    All other components within normal limits  GLUCOSE, CAPILLARY - Abnormal; Notable for the following components:   Glucose-Capillary 62 (*)    All other components within normal limits  GLUCOSE, CAPILLARY - Abnormal; Notable for the following components:   Glucose-Capillary 132 (*)    All other components within normal limits  TROPONIN T, HIGH SENSITIVITY - Abnormal; Notable for the following components:   Troponin T High Sensitivity 121 (*)    All other components within normal limits  TROPONIN T, HIGH SENSITIVITY - Abnormal; Notable for the following components:   Troponin T High Sensitivity 132 (*)    All other components within normal limits  RESP PANEL BY RT-PCR (RSV, FLU A&B, COVID)  RVPGX2  MRSA NEXT GEN BY PCR, NASAL  MAGNESIUM   PHOSPHORUS  GLUCOSE, CAPILLARY  GLUCOSE, CAPILLARY  HEPARIN  LEVEL (UNFRACTIONATED)  FACTOR 5 LEIDEN  PROTEIN C, TOTAL  PROTEIN S, TOTAL  HOMOCYSTEINE  PROTHROMBIN GENE MUTATION  ANTITHROMBIN III  LUPUS ANTICOAGULANT PANEL  COMPREHENSIVE METABOLIC PANEL WITH GFR  CBC  CBG MONITORING, ED    EKG: None  Radiology: VAS US  LOWER EXTREMITY VENOUS (DVT) Result Date: 11/29/2024  Lower Venous DVT Study Patient Name:  Denson Niccoli.  Date of Exam:   11/29/2024 Medical Rec #: [id number]              Accession #:    [id number] Date of Birth: 02-26-1946               Patient Gender: M Patient Age:   72 years Exam Location:  [facility] Procedure:      VAS US  LOWER EXTREMITY VENOUS (DVT) Referring Phys: DAVID ORTIZ --------------------------------------------------------------------------------  Indications: Pulmonary embolism.  Comparison Study: Prior negative bilateral LEV done 01/14/21 Performing Technologist: Alberta Lis RVS  Examination Guidelines: A complete evaluation includes B-mode imaging, spectral Doppler, color Doppler, and power Doppler as needed of all accessible portions of each vessel. Bilateral testing is considered an integral part of a complete examination. Limited examinations for reoccurring indications may be performed as noted. The reflux portion of the exam is performed with the patient in reverse Trendelenburg.  +---------+---------------+---------+-----------+----------+--------------+ RIGHT    CompressibilityPhasicitySpontaneityPropertiesThrombus Aging +---------+---------------+---------+-----------+----------+--------------+ CFV      Full           Yes      Yes                                 +---------+---------------+---------+-----------+----------+--------------+ SFJ      Full                                                        +---------+---------------+---------+-----------+----------+--------------+ FV Prox  Full           Yes      No                                  +---------+---------------+---------+-----------+----------+--------------+ FV Mid   Full                                                        +---------+---------------+---------+-----------+----------+--------------+  FV DistalFull           Yes      Yes                                 +---------+---------------+---------+-----------+----------+--------------+ PFV      Full           Yes      Yes                                 +---------+---------------+---------+-----------+----------+--------------+ POP                      Yes      Yes                                 +---------+---------------+---------+-----------+----------+--------------+ PTV      None                                         Acute          +---------+---------------+---------+-----------+----------+--------------+ PERO     None                                         Acute          +---------+---------------+---------+-----------+----------+--------------+   +---------+---------------+---------+-----------+----------+-------------------+ LEFT     CompressibilityPhasicitySpontaneityPropertiesThrombus Aging      +---------+---------------+---------+-----------+----------+-------------------+ CFV      Full           Yes      Yes                                      +---------+---------------+---------+-----------+----------+-------------------+ SFJ      Full                                                             +---------+---------------+---------+-----------+----------+-------------------+ FV Prox  Partial        Yes      No                   acute, poorly                                                             adhered             +---------+---------------+---------+-----------+----------+-------------------+ FV Mid   Partial                                      acute, poorly  adhered             +---------+---------------+---------+-----------+----------+-------------------+ FV DistalFull           Yes      No                                       +---------+---------------+---------+-----------+----------+-------------------+ PFV      Full           Yes      No                                       +---------+---------------+---------+-----------+----------+-------------------+ POP      Full           Yes      No                                        +---------+---------------+---------+-----------+----------+-------------------+ PTV      None                                         Acute               +---------+---------------+---------+-----------+----------+-------------------+ PERO     None                                         Acute               +---------+---------------+---------+-----------+----------+-------------------+ Gastroc  Full                                                             +---------+---------------+---------+-----------+----------+-------------------+ EIV                     Yes      No                   patent by color and                                                       Doppler             +---------+---------------+---------+-----------+----------+-------------------+     Summary: RIGHT: - Findings consistent with acute deep vein thrombosis involving the right posterior tibial veins, right peroneal veins, and right femoral vein.  - A cystic structure is found in the popliteal fossa.  LEFT: - Findings consistent with acute deep vein thrombosis involving the left femoral vein, left posterior tibial veins, and left peroneal veins.  - No cystic structure found in the popliteal fossa.  *See table(s) above for measurements and observations.    Preliminary    CT Angio Chest PE W and/or Wo Contrast  Result Date: 11/29/2024 CLINICAL DATA:  Pulmonary embolism EXAM: CT ANGIOGRAPHY CHEST WITH CONTRAST TECHNIQUE: Multidetector CT imaging of the chest was performed using the standard protocol during bolus administration of intravenous contrast. Multiplanar CT image reconstructions and MIPs were obtained to evaluate the vascular anatomy. RADIATION DOSE REDUCTION: This exam was performed according to the departmental dose-optimization program which includes automated exposure control, adjustment of the mA and/or kV according to patient size and/or use of iterative reconstruction technique.  CONTRAST:  60mL OMNIPAQUE  IOHEXOL  350 MG/ML SOLN COMPARISON:  March 06, 2024 FINDINGS: Cardiovascular: Large pulmonary embolus is noted in left pulmonary artery which extends into upper and lower lobe branches. Smaller pulmonary embolus is noted in lower lobe branches of right pulmonary artery. RV/LV ratio of 0.9 is noted which is within normal limits. Coronary artery calcifications are noted. Aortic calcifications are noted. Mediastinum/Nodes: No enlarged mediastinal, hilar, or axillary lymph nodes. Thyroid gland, trachea, and esophagus demonstrate no significant findings. Lungs/Pleura: Emphysematous disease is noted. No pneumothorax or pleural effusion. Upper Abdomen: No acute abnormality. Musculoskeletal: No chest wall abnormality. No acute or significant osseous findings. Review of the MIP images confirms the above findings. IMPRESSION: 1. Large pulmonary embolus is noted in left pulmonary artery which extends into upper and lower lobe branches. Smaller pulmonary embolus is noted in lower lobe branches of right pulmonary artery. Critical Value/emergent results were called by telephone at the time of interpretation on 11/29/2024 at 11:17 am to provider [facility] , who verbally acknowledged these results. 2. Coronary artery calcifications are noted suggesting coronary artery disease. Aortic Atherosclerosis (ICD10-I70.0) and Emphysema (ICD10-J43.9). Electronically Signed   By: Lynwood Landy Raddle M.D.   On: 11/29/2024 11:17   DG Chest 2 View Result Date: 11/29/2024 CLINICAL DATA:  Shortness of breath. EXAM: CHEST - 2 VIEW COMPARISON:  12/11/2023 FINDINGS: The cardio pericardial silhouette is enlarged. Interstitial coarsening is similar to prior. No dense focal consolidative airspace opacity. No overt airspace pulmonary edema. No pleural effusion. Lungs are hyperexpanded. Telemetry leads overlie the chest. IMPRESSION: Enlargement of the cardiopericardial silhouette with chronic interstitial prominence.  Electronically Signed   By: Camellia Candle M.D.   On: 11/29/2024 08:18     Procedures   Medications Ordered in the ED  potassium chloride  SA (KLOR-CON  M) CR tablet 40 mEq (40 mEq Oral Not Given 11/29/24 1345)  acetaminophen  (TYLENOL ) tablet 650 mg (has no administration in time range)    Or  acetaminophen  (TYLENOL ) suppository 650 mg (has no administration in time range)  ondansetron  (ZOFRAN ) tablet 4 mg (has no administration in time range)    Or  ondansetron  (ZOFRAN ) injection 4 mg (has no administration in time range)  hydrALAZINE  (APRESOLINE ) injection 10 mg (10 mg Intravenous Given 11/29/24 1825)  Chlorhexidine  Gluconate Cloth 2 % PADS 6 each (6 each Topical Given 11/29/24 1320)  heparin  ADULT infusion 100 units/mL (25000 units/250mL) (1,100 Units/hr Intravenous Infusion Verify 11/29/24 2006)  amLODipine (NORVASC) tablet 5 mg (0 mg Oral Hold 11/29/24 1529)  dextrose  50 % solution 25 g (25 g Intravenous Given 11/29/24 1622)  HYDROmorphone  (DILAUDID ) injection 0.5 mg (has no administration in time range)  Oral care mouth rinse (has no administration in time range)  Oral care mouth rinse (has no administration in time range)  ipratropium-albuterol  (DUONEB) 0.5-2.5 (3) MG/3ML nebulizer solution 3 mL (3 mLs Nebulization Given 11/29/24 2032)  sodium chloride  0.9 % bolus 500 mL (0 mLs Intravenous Stopped 11/29/24 0918)  iohexol  (OMNIPAQUE ) 350 MG/ML injection 60 mL (60 mLs Intravenous Contrast  Given 11/29/24 1052)  potassium chloride  10 mEq in 100 mL IVPB (0 mEq Intravenous Stopped 11/29/24 2005)  magnesium  sulfate IVPB 2 g 50 mL (0 g Intravenous Stopped 11/29/24 1749)  heparin  bolus via infusion 4,000 Units (4,000 Units Intravenous Bolus from Bag 11/29/24 1341)  labetalol  (NORMODYNE ) injection 20 mg (20 mg Intravenous Given 11/29/24 1407)  labetalol  (NORMODYNE ) 5 MG/ML injection (  Duplicate 11/29/24 1406)    77 y.o. male presents to the ED with complaints of weakness, cough, shortness  of breath, The differential diagnosis includes but not limited to ACS, pneumonia, UTI, sepsis, PE, pneumothorax, pleural effusion, CHF (Ddx)  On arrival pt is nontoxic, vitals significant for hypertension no other concerning vitals.. Exam significant for reported generalized weakness with associated productive cough.  I ordered medication heparin  for pulmonary embolism  Lab Tests:  I Ordered, reviewed, and interpreted labs, which included: CMP, POC CBG, CBC, UA, troponin, BNP, respiratory panel.   Troponin remarkable for elevation at 121, delta Trope at 132.  BMP remarkable for elevation at 9300, CMP remarkable for hypokalemia at 2.9 creatinine at baseline for patient in the presence of CKD.  All other lab work unremarkable.   Imaging Studies ordered:  I ordered imaging studies which included chest x-ray, which showed enlargement of the cardiopericardial silhouette.  CT angio PE which showed large pulmonary embolus in the left pulmonary artery extending into the upper and lower branches.  Smaller pulmonary embolus in the right pulmonary artery, coronary artery calcifications, aortic arthrosclerosis.  ED Course:   Initial evaluation patient sitting comfortably in ED bed no acute distress nontoxic-appearing.  Patient is endorsing progressive weakness over the last 24 to 48 hours to where has not been able to get out of bed today.  Patient does not meet SIRS criteria at initial evaluation.  Patient denies any current chest pain and does not report any history of MI.  CBC is unremarkable with no leukocytosis.  Patient is afebrile.   Troponin was elevated at 121.  Patient denies chest pain and shortness of breath.  Patient has strong radial pulses bilaterally and no masses or bruit noted in abdomen.  Repeat troponin was 132.  On reevaluation patient sitting comfortably in ED bed and reports that he feels much better than when he first got there but still feels weak.  BNP and CTA PE study will be  ordered for further evaluation.  CT was remarkable for large pulmonary embolus in the left pulmonary artery and smaller pulmonary embolus in the right pulmonary artery.  Patient was started on heparin  drip and consult the hospitalist for admission.  Dr. Celinda was consulted with hospitalist and agreed to admit patient.  Portions of this note were generated with Scientist, clinical (histocompatibility and immunogenetics). Dictation errors may occur despite best attempts at proofreading.   Final diagnoses:  Multiple subsegmental pulmonary emboli without acute cor pulmonale [facility])    ED Discharge Orders     None          Myriam Fonda GORMAN DEVONNA 11/29/24 2106    Dreama Longs, MD 11/30/24 0003

## 2024-11-29 NOTE — Progress Notes (Signed)
 PHARMACY - ANTICOAGULATION CONSULT NOTE  Pharmacy Consult for IV heparin  Indication: pulmonary embolus  Allergies[1]  Patient Measurements: Height: 5' 5 (165.1 cm) Weight: 67.4 kg (148 lb 9.4 oz) IBW/kg (Calculated) : 61.5 HEPARIN  DW (KG): 67.4  Vital Signs: Temp: 97.4 F (36.3 C) (12/12 1630) Temp Source: Axillary (12/12 1630) BP: 180/88 (12/12 1800) Pulse Rate: 79 (12/12 2032)  Labs: Recent Labs    11/29/24 0701 11/29/24 2239  HGB 14.5  --   HCT 44.6  --   PLT 202  --   HEPARINUNFRC  --  0.66  CREATININE 1.74*  --     Estimated Creatinine Clearance: 30.9 mL/min (A) (by C-G formula based on SCr of 1.74 mg/dL (H)).   Medical History: Past Medical History:  Diagnosis Date   AAA (abdominal aortic aneurysm) 2021   followed by dr valerie;   incidental finding infrarenal aaa 3.1 x 2.8 per abd/pelvis ct 02-26-20 epic;   last CT  03-10-2022  3.4cm   Arthritis    hips, back, neck    Benign localized prostatic hyperplasia with lower urinary tract symptoms (LUTS)    urologist--- dr nieves   CAD (coronary artery disease) 1994   cardiologist---  dr mona;   s/p angioplasty and stenting x3 1994;   nuclear stress test 10-15-2020  low risk, no ischemia, nuclear ef 59%   Chronic stable angina 2005   CKD (chronic kidney disease), stage III (HCC)    Complication of anesthesia    awareness during anesthesia;  w/ cholestectomy in 1990s pt stated thinks he stopped breathing   COPD mixed type (HCC)    pulmolonogist--- dr c. young   DDD (degenerative disc disease), cervical    Diverticulosis of colon    Dyspnea    chronic   Edema of both lower extremities    GERD (gastroesophageal reflux disease)    hx of   Hiatal hernia    History of adenomatous polyp of colon    History of kidney stones    History of lower GI bleeding 02/2022   admission in epic;   diverticular bleed , no transfusions   History of melanoma    per pt excision forehead , localized   History of  prediabetes    History of tuberculosis 1966   Hyperlipidemia    Hypertension    Neuromuscular disorder (HCC)    nerve issues with back    OSA on CPAP 2000   followed by dr c. young;   sleep study in epic , severe osa;   wears cpap auto set 5 to 20   PAD (peripheral artery disease)    vascular--- dr eliza;   hx bilateral CIA stenting in 1990s;  left occluded, dr gerlean s/p  right to left fem-fem bypass 09/ 2012,  renal artery stenosis and aaa   Parotid mass    followed by dr jesus  (ENT);  per pt stable   Peripheral vascular disease    PVC's (premature ventricular contractions)    Renal artery stenosis    02-14-2020  renal angiogram left > right renal stenosis   S/P primary angioplasty with coronary stent 1994    Medications:  Medications Prior to Admission  Medication Sig Dispense Refill Last Dose/Taking   albuterol  (VENTOLIN  HFA) 108 (90 Base) MCG/ACT inhaler INHALE 1 PUFF BY MOUTH INTO THE LUNGS TWICE DAILY AS NEEDED FOR WHEEZING OR SHORTNESS OF BREATH 8.5 g 5    antiseptic oral rinse (BIOTENE) LIQD 15 mLs by Mouth Rinse route daily  as needed for dry mouth.       aspirin  EC 81 MG tablet Take 81 mg by mouth daily. Swallow whole.      Azelastine -Fluticasone  137-50 MCG/ACT SUSP Place 1-2 sprays into both nostrils at bedtime. 23 g 3    Cholecalciferol (VITAMIN D-3 PO) Take 1 capsule by mouth every other day.      diltiazem  (DILT-XR) 120 MG 24 hr capsule Take 1 capsule (120 mg total) by mouth daily. 90 capsule 2    docusate sodium  (COLACE) 100 MG capsule Take 100 mg by mouth daily.      esomeprazole (NEXIUM) 40 MG capsule Take 40 mg by mouth daily as needed (acid reflux/indigestion.).      finasteride  (PROSCAR ) 5 MG tablet Take 1 tablet by mouth at bedtime.      furosemide  (LASIX ) 20 MG tablet TAKE 1 TABLET(20 MG) BY MOUTH DAILY AS NEEDED 30 tablet 0    irbesartan  (AVAPRO ) 150 MG tablet Take 150 mg by mouth every evening.      isosorbide  mononitrate (IMDUR ) 60 MG 24 hr tablet Take 60  mg by mouth every evening.      loperamide  (IMODIUM  A-D) 2 MG tablet Take 2 mg by mouth as needed.      MAGNESIUM -OXIDE 400 (240 Mg) MG tablet Take 400 mg by mouth every other day.      Menthol , Topical Analgesic, (BLUE-EMU MAXIMUM STRENGTH EX) Apply 1 application topically 3 (three) times daily as needed (pain.).      Polyethyl Glycol-Propyl Glycol (SYSTANE ULTRA OP) Place 1 drop into both eyes daily as needed (Dry eye).      potassium chloride  (KLOR-CON  M) 10 MEQ tablet Take 1 tablet on the days Lasix  is taken. 30 tablet 2    rosuvastatin (CRESTOR) 10 MG tablet Take 10 mg by mouth daily.      simethicone  (MYLICON) 125 MG chewable tablet Chew 125 mg by mouth every 6 (six) hours as needed for flatulence. Takes gas x      Tiotropium Bromide-Olodaterol (STIOLTO RESPIMAT ) 2.5-2.5 MCG/ACT AERS Inhale 2 puffs into the lungs daily. 4 g 12    traMADol  (ULTRAM ) 50 MG tablet Take 1 tablet by mouth 4 (four) times daily as needed.      Scheduled:   amLODipine  5 mg Oral Daily   Chlorhexidine  Gluconate Cloth  6 each Topical Daily   ipratropium-albuterol   3 mL Nebulization Q6H   mouth rinse  15 mL Mouth Rinse 4 times per day   potassium chloride   40 mEq Oral Once   PRN: acetaminophen  **OR** acetaminophen , dextrose , hydrALAZINE , HYDROmorphone  (DILAUDID ) injection, ondansetron  **OR** ondansetron  (ZOFRAN ) IV, mouth rinse  Assessment: 34 yoM with PMH AAA, CAD s/p PCI in 1994, PAD s/p stenting in 1990s and fem bypass in 2012, renal artery stenosis, CKD3, COPD, Hx LGIB, melanoma, Hx TB, presents 12/12 w/ weakness & dyspnea. CTA chest shows large L > R bilateral PE. Pharmacy consulted to start IV heparin .  Baseline INR, aPTT: not done Prior anticoagulation: none  Significant events:  Today, 11/29/2024: CBC: WNL Last pre-admit SCr from Jan 2024, but consistent with current reading No bleeding or infusion issues per nursing  2239 heparin  level 0.66 therapeutic on 1100 units/hr No bleeding noted   Goal  of Therapy: Heparin  level 0.3-0.7 units/ml Monitor platelets by anticoagulation protocol: Yes  Plan: Continue Heparin  1100 units/hr IV infusion Confirmatory heparin  level in 8 hours Daily CBC, daily heparin  level once stable Monitor for signs of bleeding or thrombosis  Leeroy Mace  RPh 11/29/2024, 11:42 PM      [1]  Allergies Allergen Reactions   Penicillins     Childhood reaction  REACTION: rapid pulse Did it involve swelling of the face/tongue/throat, SOB, or low BP? Yes Did it involve sudden or severe rash/hives, skin peeling, or any reaction on the inside of your mouth or nose? Unknown Did you need to seek medical attention at a hospital or doctor's office? yes When did it last happen?      childhood  If all above answers are NO, may proceed with cephalosporin use.    Fish Oil     Other reaction(s): Heart attack symptoms   Other     Oily seafood salmon-- chest pain   Penicillin G     Other reaction(s): Unknown   Mold Extract [Trichophyton] Rash

## 2024-11-29 NOTE — Progress Notes (Signed)
 VASCULAR LAB    Bilateral lower extremity venous duplex has been performed.  See CV proc for preliminary results.   Kiona Blume, RVT 11/29/2024, 12:48 PM

## 2024-11-29 NOTE — H&P (Signed)
 History and Physical    Patient: Dean [name]. FMW:[id number] DOB: 1946/08/04 DOA: 11/29/2024 DOS: the patient was seen and examined on 11/29/2024 PCP: Shepard Ade, MD  Patient coming from: Home  Chief Complaint:  Chief Complaint  Patient presents with   Weakness   HPI: Dean [name]. is a 78 y.o. male with medical history significant of AAA, osteoarthritis of the hip, DDD of the cervical and lumbar spine, CAD, history of coronary stent placement, stage III CKD, mixed type COPD, diverticulosis, chronic dyspnea, lower extremity edema, GERD/hiatal hernia, colon polyp, nephrolithiasis, history of lower GI bleed, history of melanoma, history of prediabetes, history of tuberculosis, hyperlipidemia, hypertension, OSA on CPAP, PAD, parotid mass PVCs, renal artery stenosis who presented to the emergency department complaints of generalized weakness and dyspnea.  He denied fever, chills, rhinorrhea, sore throat, wheezing or hemoptysis.  No chest pain, palpitations, diaphoresis, PND, orthopnea or pitting edema of the lower extremities.  No abdominal pain, nausea, emesis, diarrhea, constipation, melena or hematochezia.  No flank pain, dysuria, frequency or hematuria.  No polyuria, polydipsia, polyphagia or blurred vision.   Lab work: Urinalysis showed ketones of 5 and protein greater than 300 mg/dL, the rest of the urinalysis was normal.  CBC showed a white count 8.6, hemoglobin 14.5 g/dL and platelets 797.  Coronavirus, influenza and RSV PCR was negative.  Troponin was 121 then 132 ng/L and proBNP 9331.0 pg/mL.  CMP showed 746, potassium 2.9, chloride 101 and CO2 29 mmol/L with a normal anion gap.  Normal calcium .  Glucose 72, BUN 15 and creatinine 1.74 mg/dL.  LFTs were normal.  Imaging: 2 view chest radiograph show enlargement of the cardiopericardial silhouette with chronic interstitial prominence.  CTA chest with a large pulmonary embolus in the left pulmonary artery which extends  into upper and lower lobe branches.  Smaller pulmonary embolus in the lower lobe branches of the right pulmonary artery.  Coronary artery calcifications.  Aortic atherosclerosis.  Emphysema.  ED course: Initial vital signs were temperature 98.3 F, pulse 73, respirations 20, BP 223/192 mmHg and O2 sat 96% on room air.  The patient received 500 mL of normal saline bolus.   Review of Systems: As mentioned in the history of present illness. All other systems reviewed and are negative. Past Medical History:  Diagnosis Date   AAA (abdominal aortic aneurysm) 2021   followed by dr valerie;   incidental finding infrarenal aaa 3.1 x 2.8 per abd/pelvis ct 02-26-20 epic;   last CT  03-10-2022  3.4cm   Arthritis    hips, back, neck    Benign localized prostatic hyperplasia with lower urinary tract symptoms (LUTS)    urologist--- dr nieves   CAD (coronary artery disease) 1994   cardiologist---  dr mona;   s/p angioplasty and stenting x3 1994;   nuclear stress test 10-15-2020  low risk, no ischemia, nuclear ef 59%   Chronic stable angina 2005   CKD (chronic kidney disease), stage III (HCC)    Complication of anesthesia    awareness during anesthesia;  w/ cholestectomy in 1990s pt stated thinks he stopped breathing   COPD mixed type (HCC)    pulmolonogist--- dr c. young   DDD (degenerative disc disease), cervical    Diverticulosis of colon    Dyspnea    chronic   Edema of both lower extremities    GERD (gastroesophageal reflux disease)    hx of   Hiatal hernia    History of adenomatous polyp of colon  History of kidney stones    History of lower GI bleeding 02/2022   admission in epic;   diverticular bleed , no transfusions   History of melanoma    per pt excision forehead , localized   History of prediabetes    History of tuberculosis 1966   Hyperlipidemia    Hypertension    Neuromuscular disorder (HCC)    nerve issues with back    OSA on CPAP 2000   followed by dr c. young;   sleep  study in epic , severe osa;   wears cpap auto set 5 to 20   PAD (peripheral artery disease)    vascular--- dr eliza;   hx bilateral CIA stenting in 1990s;  left occluded, dr gerlean s/p  right to left fem-fem bypass 09/ 2012,  renal artery stenosis and aaa   Parotid mass    followed by dr jesus  (ENT);  per pt stable   Peripheral vascular disease    PVC's (premature ventricular contractions)    Renal artery stenosis    02-14-2020  renal angiogram left > right renal stenosis   S/P primary angioplasty with coronary stent 1994   Past Surgical History:  Procedure Laterality Date   APPENDECTOMY  1974   CATARACT EXTRACTION W/ INTRAOCULAR LENS IMPLANT Bilateral 2019   CHOLECYSTECTOMY, LAPAROSCOPIC     1990s   COLONOSCOPY WITH PROPOFOL   04/27/2022   dr abran   CORONARY ANGIOPLASTY WITH STENT PLACEMENT  1994   CYSTOSCOPY WITH INSERTION OF UROLIFT N/A 04/14/2020   Procedure: CYSTOSCOPY WITH INSERTION OF UROLIFT;  Surgeon: Nieves Cough, MD;  Location: [facility] [place];  Service: Urology;  Laterality: N/A;   CYSTOSCOPY WITH STENT PLACEMENT Right 07/07/2020   Procedure: CYSTOSCOPY WITH STENT CHANGE;  Surgeon: Nieves Cough, MD;  Location: [facility];  Service: Urology;  Laterality: Right;   CYSTOSCOPY WITH URETEROSCOPY AND STENT PLACEMENT Bilateral 01/03/2020   Procedure: CYSTOSCOPY WITH RETROGRADE/ URETEROSCOPY AND RIGHT STENT PLACEMENT BLADDER BIOPSY;  Surgeon: Nieves Cough, MD;  Location: [facility];  Service: Urology;  Laterality: Bilateral;  ONLY NEEDS 30 MIN   CYSTOSCOPY WITH URETEROSCOPY AND STENT PLACEMENT Right 04/14/2020   Procedure: CYSTOSCOPY WITH URETEROSCOPY, RETROGRADE  AND STENT REPLACEMENT;  Surgeon: Nieves Cough, MD;  Location[facility];  Service: Urology;  Laterality: Right;   CYSTOSCOPY/URETEROSCOPY/HOLMIUM LASER/STENT PLACEMENT  05/18/2005   @[facility]  by dr andra OCTAVE BYPASS GRAFT   08/31/2011   @[facility]  by dr gerlean;  right to left   ILIAC ARTERY STENT Bilateral    1990s;   bilateral common iliac artery stenting   LAPAROSCOPY ABDOMEN DIAGNOSTIC  12/03/2008   @[facility]  by dr ebbie;   Lap. lysis adhesions/  open umbilical hernia repair   LUMBAR LAMINECTOMY  09/07/2004   @[facility]  by dr pool   RENAL ANGIOGRAPHY N/A 02/14/2020   Procedure: RENAL ANGIOGRAPHY;  Surgeon: Harvey Carlin BRAVO, MD;  Location: [facility] INVASIVE CV LAB;  Service: Cardiovascular;  Laterality: N/A;  Bilateral   ROBOT ASSISTED PYELOPLASTY Right 11/20/2020   Procedure: XI ROBOTIC RIGHT ASSISTED PYELOPLASTY AND STENT PLACEMENT;  Surgeon: Cam Morene ORN, MD;  Location: [facility];  Service: Urology;  Laterality: Right;   TONSILLECTOMY     child   TOTAL HIP ARTHROPLASTY Right 07/15/2020   Procedure: TOTAL HIP ARTHROPLASTY ANTERIOR APPROACH;  Surgeon: Melodi Lerner, MD;  Location: [facility];  Service: Orthopedics;  Laterality: Right;    UMBILICAL HERNIA REPAIR  2004   Social  History:  reports that he has been smoking cigarettes. He has a 76.5 pack-year smoking history. He has never been exposed to tobacco smoke. He has never used smokeless tobacco. He reports that he does not currently use alcohol . He reports that he does not use drugs.  Allergies[1]  Family History  Problem Relation Age of Onset   Other Father        ETOH   Diabetes Father    Hypertension Father    Kidney cancer Mother    Cancer Mother        renal   Hyperlipidemia Mother    Hypertension Mother    Cancer Sister        breast   Hyperlipidemia Sister    Hypertension Sister    Breast cancer Sister    Hyperlipidemia Brother    Hypertension Brother    Prostate cancer Brother    Colon cancer Neg Hx    Colon polyps Neg Hx    Esophageal cancer Neg Hx    Rectal cancer Neg Hx    Stomach cancer Neg Hx     Prior to Admission medications  Medication Sig Start Date End Date Taking? Authorizing Provider  albuterol  (VENTOLIN  HFA) 108 (90 Base)  MCG/ACT inhaler INHALE 1 PUFF BY MOUTH INTO THE LUNGS TWICE DAILY AS NEEDED FOR WHEEZING OR SHORTNESS OF BREATH 08/08/24   Neysa Rama D, MD  antiseptic oral rinse (BIOTENE) LIQD 15 mLs by Mouth Rinse route daily as needed for dry mouth.     [provider]  aspirin  EC 81 MG tablet Take 81 mg by mouth daily. Swallow whole.    [provider]  Azelastine -Fluticasone  137-50 MCG/ACT SUSP Place 1-2 sprays into both nostrils at bedtime. 05/10/23   Neysa Rama D, MD  Cholecalciferol (VITAMIN D-3 PO) Take 1 capsule by mouth every other day.    [provider]  diltiazem  (DILT-XR) 120 MG 24 hr capsule Take 1 capsule (120 mg total) by mouth daily. 04/13/23   Hilty, Vinie BROCKS, MD  docusate sodium  (COLACE) 100 MG capsule Take 100 mg by mouth daily.    [provider]  esomeprazole (NEXIUM) 40 MG capsule Take 40 mg by mouth daily as needed (acid reflux/indigestion.). 02/07/19   [provider]  finasteride  (PROSCAR ) 5 MG tablet Take 1 tablet by mouth at bedtime.    [provider]  furosemide  (LASIX ) 20 MG tablet TAKE 1 TABLET(20 MG) BY MOUTH DAILY AS NEEDED 05/28/24   Hilty, Vinie BROCKS, MD  irbesartan  (AVAPRO ) 150 MG tablet Take 150 mg by mouth every evening. 11/11/19   [provider]  isosorbide  mononitrate (IMDUR ) 60 MG 24 hr tablet Take 60 mg by mouth every evening.    [provider]  loperamide  (IMODIUM  A-D) 2 MG tablet Take 2 mg by mouth as needed.    [provider]  MAGNESIUM -OXIDE 400 (240 Mg) MG tablet Take 400 mg by mouth every other day.    [provider]  Menthol , Topical Analgesic, (BLUE-EMU MAXIMUM STRENGTH EX) Apply 1 application topically 3 (three) times daily as needed (pain.).    [provider]  Polyethyl Glycol-Propyl Glycol (SYSTANE ULTRA OP) Place 1 drop into both eyes daily as needed (Dry eye).    [provider]  potassium chloride  (KLOR-CON  M) 10 MEQ tablet Take 1 tablet on the  days Lasix  is taken. 04/13/23   Hilty, Vinie BROCKS, MD  rosuvastatin (CRESTOR) 10 MG tablet Take 10 mg by mouth daily.  [provider]  simethicone  (MYLICON) 125 MG chewable tablet Chew 125 mg by mouth every 6 (six) hours as needed for flatulence. Takes gas x    [provider]  Tiotropium Bromide-Olodaterol (STIOLTO RESPIMAT ) 2.5-2.5 MCG/ACT AERS Inhale 2 puffs into the lungs daily. 06/04/24   Neysa Reggy BIRCH, MD  traMADol  (ULTRAM ) 50 MG tablet Take 1 tablet by mouth 4 (four) times daily as needed. 03/21/22   [provider]    Physical Exam: Vitals:   11/29/24 0953 11/29/24 0955 11/29/24 1045 11/29/24 1048  BP:   (!) 182/103   Pulse: 67 68 (!) 47   Resp:   19   Temp:    98 F (36.7 C)  TempSrc:    Oral  SpO2: 99% 97% 99%    Physical Exam Vitals and nursing note reviewed.  Constitutional:      General: He is awake. He is not in acute distress.    Appearance: Normal appearance. He is ill-appearing.  HENT:     Head: Normocephalic.     Nose: No rhinorrhea.  Eyes:     General: No scleral icterus.    Pupils: Pupils are equal, round, and reactive to light.  Neck:     Vascular: No JVD.  Cardiovascular:     Rate and Rhythm: Normal rate and regular rhythm.     Heart sounds: S1 normal and S2 normal.  Pulmonary:     Effort: Pulmonary effort is normal.     Breath sounds: Normal breath sounds. No wheezing, rhonchi or rales.  Abdominal:     General: Bowel sounds are normal. There is no distension.     Palpations: Abdomen is soft.     Tenderness: There is no abdominal tenderness. There is no right CVA tenderness or left CVA tenderness.  Musculoskeletal:     Cervical back: Neck supple.     Right lower leg: No edema.     Left lower leg: No edema.  Skin:    General: Skin is warm and dry.  Neurological:     General: No focal deficit present.     Mental Status: He is alert and oriented to person, place, and time.  Psychiatric:        Mood and Affect: Mood  normal.        Behavior: Behavior normal. Behavior is cooperative.    Data Reviewed:  Results are pending, will review when available. 11/25/2022 echocardiogram report. IMPRESSIONS:   1. Left ventricular ejection fraction, by estimation, is 60 to 65%. Left  ventricular ejection fraction by 3D volume is 62 %. The left ventricle has  normal function. The left ventricle has no regional wall motion  abnormalities. There is mild left  ventricular hypertrophy of the basal-septal segment. Left ventricular  diastolic parameters are consistent with Grade II diastolic dysfunction  (pseudonormalization). Elevated left atrial pressure.   2. Right ventricular systolic function is normal. The right ventricular  size is normal. Tricuspid regurgitation signal is inadequate for assessing  PA pressure.   3. Left atrial size was mildly dilated.   4. The mitral valve is normal in structure. Mild to moderate mitral valve  regurgitation.   5. The aortic valve is tricuspid. There is mild calcification of the  aortic valve. There is mild thickening of the aortic valve. Aortic valve  regurgitation is trivial. Aortic valve sclerosis/calcification is present,  without any evidence of aortic  stenosis.   6. The inferior vena cava is dilated in size with >50% respiratory  variability, suggesting right atrial pressure of 8 mmHg.   Comparison(s): No significant change from prior study. Prior images  reviewed side by side. There are signs of increased left atrial pressure  now.   EKG: Vent. rate 81 BPM  PR interval 152 ms QRS duration 133 ms QT/QTcB 433/503 ms P-R-T axes 75 86 260 Sinus rhythm Paired ventricular premature complexes IVCD, consider atypical RBBB  Consider left ventricular hypertrophy Inferior infarct, age indeterminate Repol abnrm suggests ischemia, diffuse leads Prolonged QT interval  Assessment and Plan: Principal Problem:   Pulmonary embolism (HCC) Associated with:   Elevated  brain natriuretic peptide (BNP) level  And:   Elevated troponin  No heart strain.   RV to LV ratio 0.9. Stepdown/inpatient. Supplemental oxygen  as needed. Continue heparin  infusion. Hydromorphone  as needed for pain. Obtain echocardiogram. Obtain lower extremity Doppler. -Done. Had increased oxygen  requirement. - PCCM consult appreciated.  Active Problems:   TOBACCO USER May use NicoDerm or Nicorette gum.    Obstructive sleep apnea Not on CPAP.    Essential hypertension On amlodipine 10 mg p.o. daily. On Avapro  150 mg p.o. daily Will resume once cleared for oral intake.    Dysphagia Will keep n.p.o. for now. SLP consulted.    Coronary atherosclerosis Has been on aspirin , amlodipine, Imdur  and statin.    COPD mixed type (HCC) Bronchodilators as needed.    GERD Antiacid, H2 blocker or PPI as needed.    PAD (peripheral artery disease) Discontinue aspirin  while on AC. Continue statin if cleared by SLP    Hyperlipidemia   Aortic atherosclerosis On rosuvastatin.    Pressure injury of skin Continue local care and preventive measures.    Advance Care Planning:   Code Status: Full Code   Consults: PCCM service.  Family Communication:   Severity of Illness: The appropriate patient status for this patient is INPATIENT. Inpatient status is judged to be reasonable and necessary in order to provide the required intensity of service to ensure the patient's safety. The patient's presenting symptoms, physical exam findings, and initial radiographic and laboratory data in the context of their chronic comorbidities is felt to place them at high risk for further clinical deterioration. Furthermore, it is not anticipated that the patient will be medically stable for discharge from the hospital within 2 midnights of admission.   * I certify that at the point of admission it is my clinical judgment that the patient will require inpatient hospital care spanning beyond 2 midnights  from the point of admission due to high intensity of service, high risk for further deterioration and high frequency of surveillance required.*  Author: Alm Dorn Castor, MD 11/29/2024 11:44 AM  For on call review [email or web address].   This document was prepared using Dragon voice recognition software and may contain some unintended transcription errors.     [1]  Allergies Allergen Reactions   Penicillins     Childhood reaction  REACTION: rapid pulse Did it involve swelling of the face/tongue/throat, SOB, or low BP? Yes Did it involve sudden or severe rash/hives, skin peeling, or any reaction on the inside of your mouth or nose? Unknown Did you need to seek medical attention at a hospital or doctor's office? yes When did it last happen?      childhood  If all above answers are NO, may proceed with cephalosporin use.    Fish Oil     Other reaction(s): Heart attack symptoms   Other     Oily seafood salmon-- chest pain  Penicillin G     Other reaction(s): Unknown   Mold Extract [Trichophyton] Rash

## 2024-11-29 NOTE — Telephone Encounter (Signed)
 Patient Product/process development scientist completed.    The patient is insured through [place]. Patient has Medicare and is not eligible for a copay card, but may be able to apply for patient assistance or Medicare RX Payment Plan (Patient Must reach out to their plan, if eligible for payment plan), if available.    Ran test claim for Eliquis 5 mg and the current 30 day co-pay is $40.00.  Ran test claim for Xarelto 20 mg and the current 30 day co-pay is $40.00.  This test claim was processed through [facility]- copay amounts may vary at other pharmacies due to pharmacy/plan contracts, or as the patient moves through the different stages of their insurance plan.     Reyes Sharps, CPHT Pharmacy Technician Patient Advocate Specialist Lead [facility] Patient Advocate Team Direct Number: [phone]  Fax: [phone]

## 2024-11-29 NOTE — ED Triage Notes (Signed)
 Pt came in via EMS from home w/ c/o general weakness x 24hr. Unable to get out bed to get to bathroom. Wet cough x several days. Endorses SOB. Elevated BP. Unsure if he is on BP medications.

## 2024-11-29 NOTE — Plan of Care (Signed)
   Problem: Health Behavior/Discharge Planning: Goal: Ability to manage health-related needs will improve Outcome: Progressing   Problem: Clinical Measurements: Goal: Will remain free from infection Outcome: Progressing

## 2024-11-29 NOTE — Consult Note (Signed)
 NAME:  Dean Gearin., MRN:  [id number], DOB:  October 30, 1946, LOS: 0 ADMISSION DATE:  11/29/2024, CONSULTATION DATE:  12/12 REFERRING MD:  Dr. Celinda, CHIEF COMPLAINT:  PE   History of Present Illness:  78 year old male with past medical history as below, which is significant for coronary artery disease, stage III CKD, COPD, OSA on CPAP, renal artery stenosis, AAA, and GI bleed.  Who presented to [facility] emergency department 12/12 with complaints of generalized weakness and dyspnea.  Evaluation in the emergency department included chest x-ray which showed cardiomegaly and chronic interstitial prominence.  CT angiogram of the chest demonstrated large left-sided pulmonary embolism and smaller pulmonary embolism on the right.  RV LV ratio 0.9.  He was admitted to the hospital service on a heparin  infusion.  In the evening hours patient began to have increasing oxygen  requirement and PCCM was asked to evaluate.  Pertinent  Medical History   has a past medical history of AAA (abdominal aortic aneurysm) (2021), Arthritis, Benign localized prostatic hyperplasia with lower urinary tract symptoms (LUTS), CAD (coronary artery disease) (1994), Chronic stable angina (2005), CKD (chronic kidney disease), stage III (HCC), Complication of anesthesia, COPD mixed type (HCC), DDD (degenerative disc disease), cervical, Diverticulosis of colon, Dyspnea, Edema of both lower extremities, GERD (gastroesophageal reflux disease), Hiatal hernia, History of adenomatous polyp of colon, History of kidney stones, History of lower GI bleeding (02/2022), History of melanoma, History of prediabetes, History of tuberculosis (1966), Hyperlipidemia, Hypertension, Neuromuscular disorder (HCC), OSA on CPAP (2000), PAD (peripheral artery disease), Parotid mass, Peripheral vascular disease, PVC's (premature ventricular contractions), Renal artery stenosis, and S/P primary angioplasty with coronary stent (1994).   Significant Hospital  Events: Including procedures, antibiotic start and stop dates in addition to other pertinent events   12/12 admit to [facility] for PE  Interim History / Subjective:    Objective    Blood pressure (!) 180/88, pulse (!) 50, temperature (!) 97.4 F (36.3 C), temperature source Axillary, resp. rate 15, height 5' 5 (1.651 m), weight 67.4 kg, SpO2 90%.        Intake/Output Summary (Last 24 hours) at 11/29/2024 1823 Last data filed at 11/29/2024 1754 Gross per 24 hour  Intake 835.78 ml  Output --  Net 835.78 ml   Filed Weights   11/29/24 1314  Weight: 67.4 kg    Examination: General: Elderly appearing male in NAD HENT: [place]/AT, PERRL, no JVD Lungs: Clear bilateral breath sounds. Some referred upper airway sounds.  Cardiovascular: Regular, intermittent periods of bradycardia. Abdomen: Soft, NT, ND Extremities: No acute deformity Neuro: Alert, oriented, non-focal  Resolved problem list   Assessment and Plan   Pulmonary Embolism: significant clot burden on the left, lesser burden on the right. No RV strain on CT. Pro BNP 9331, Trop 132. No lactic sent. PESI class 5, Very high risk. RV/LV ratio 0.9. POCUS -  - Heparin  infusion - Lower extremity dopplers - Echocardiogram pending - Warrants evaluation by interventional radiology for possible thrombectomy given high risk features and biomarker elevation.  - Supplemental O2 to keep sats > 92% - PCCM will follow  OSA on CPAP - at bedtime CPAP  COPD without exacerbation - Duonebs in place of home Stiolto   Labs   CBC: Recent Labs  Lab 11/29/24 0701  WBC 8.6  NEUTROABS 6.5  HGB 14.5  HCT 44.6  MCV 97.2  PLT 202    Basic Metabolic Panel: Recent Labs  Lab 11/29/24 0701 11/29/24 0925  NA 146*  --  K 2.9*  --   CL 101  --   CO2 29  --   GLUCOSE 72  --   BUN 15  --   CREATININE 1.74*  --   CALCIUM  8.9  --   MG  --  1.8  PHOS  --  3.4   GFR: Estimated Creatinine Clearance: 30.9 mL/min (A) (by C-G formula based  on SCr of 1.74 mg/dL (H)). Recent Labs  Lab 11/29/24 0701  WBC 8.6    Liver Function Tests: Recent Labs  Lab 11/29/24 0701  AST 30  ALT 7  ALKPHOS 118  BILITOT 0.6  PROT 7.2  ALBUMIN 3.5   No results for input(s): LIPASE, AMYLASE in the last 168 hours. No results for input(s): AMMONIA in the last 168 hours.  ABG    Component Value Date/Time   TCO2 26 01/03/2023 0653     Coagulation Profile: No results for input(s): INR, PROTIME in the last 168 hours.  Cardiac Enzymes: No results for input(s): CKTOTAL, CKMB, CKMBINDEX, TROPONINI in the last 168 hours.  HbA1C: Hgb A1c MFr Bld  Date/Time Value Ref Range Status  11/05/2019 10:49 AM 6.6 (H) 4.8 - 5.6 % Final    Comment:    (NOTE) Pre diabetes:          5.7%-6.4% Diabetes:              >6.4% Glycemic control for   <7.0% adults with diabetes     CBG: Recent Labs  Lab 11/29/24 0732 11/29/24 1324 11/29/24 1613 11/29/24 1646  GLUCAP 72 81 62* 132*    Review of Systems:   Bolds are positive  Constitutional: weight loss, gain, night sweats, Fevers, chills, fatigue .  HEENT: headaches, Sore throat, sneezing, nasal congestion, post nasal drip, Difficulty swallowing, Tooth/dental problems, visual complaints visual changes, ear ache CV:  chest pain, radiates:,Orthopnea, PND, swelling in lower extremities, dizziness, palpitations, syncope.  GI  heartburn, indigestion, abdominal pain, nausea, vomiting, diarrhea, change in bowel habits, loss of appetite, bloody stools.  Resp: cough, productive: , hemoptysis, dyspnea, chest pain, pleuritic.  Skin: rash or itching or icterus GU: dysuria, change in color of urine, urgency or frequency. flank pain, hematuria  MS: joint pain or swelling. decreased range of motion  Psych: change in mood or affect. depression or anxiety.  Neuro: difficulty with speech, weakness, numbness, ataxia    Past Medical History:  He,  has a past medical history of AAA (abdominal  aortic aneurysm) (2021), Arthritis, Benign localized prostatic hyperplasia with lower urinary tract symptoms (LUTS), CAD (coronary artery disease) (1994), Chronic stable angina (2005), CKD (chronic kidney disease), stage III (HCC), Complication of anesthesia, COPD mixed type (HCC), DDD (degenerative disc disease), cervical, Diverticulosis of colon, Dyspnea, Edema of both lower extremities, GERD (gastroesophageal reflux disease), Hiatal hernia, History of adenomatous polyp of colon, History of kidney stones, History of lower GI bleeding (02/2022), History of melanoma, History of prediabetes, History of tuberculosis (1966), Hyperlipidemia, Hypertension, Neuromuscular disorder (HCC), OSA on CPAP (2000), PAD (peripheral artery disease), Parotid mass, Peripheral vascular disease, PVC's (premature ventricular contractions), Renal artery stenosis, and S/P primary angioplasty with coronary stent (1994).   Surgical History:   Past Surgical History:  Procedure Laterality Date   APPENDECTOMY  1974   CATARACT EXTRACTION W/ INTRAOCULAR LENS IMPLANT Bilateral 2019   CHOLECYSTECTOMY, LAPAROSCOPIC     1990s   COLONOSCOPY WITH PROPOFOL   04/27/2022   dr abran   CORONARY ANGIOPLASTY WITH STENT PLACEMENT  1994   CYSTOSCOPY WITH  INSERTION OF UROLIFT N/A 04/14/2020   Procedure: CYSTOSCOPY WITH INSERTION OF UROLIFT;  Surgeon: Nieves Cough, MD;  Location: [facility];  Service: Urology;  Laterality: N/A;   CYSTOSCOPY WITH STENT PLACEMENT Right 07/07/2020   Procedure: CYSTOSCOPY WITH STENT CHANGE;  Surgeon: Nieves Cough, MD;  Location[facility];  Service: Urology;  Laterality: Right;   CYSTOSCOPY WITH URETEROSCOPY AND STENT PLACEMENT Bilateral 01/03/2020   Procedure: CYSTOSCOPY WITH RETROGRADE/ URETEROSCOPY AND RIGHT STENT PLACEMENT BLADDER BIOPSY;  Surgeon: Nieves Cough, MD;  Location: [facility];  Service: Urology;  Laterality: Bilateral;  ONLY NEEDS 30 MIN    CYSTOSCOPY WITH URETEROSCOPY AND STENT PLACEMENT Right 04/14/2020   Procedure: CYSTOSCOPY WITH URETEROSCOPY, RETROGRADE  AND STENT REPLACEMENT;  Surgeon: Nieves Cough, MD;  Location[facility];  Service: Urology;  Laterality: Right;   CYSTOSCOPY/URETEROSCOPY/HOLMIUM LASER/STENT PLACEMENT  05/18/2005   @[facility]  by dr andra OCTAVE BYPASS GRAFT  08/31/2011   @[facility]  by dr gerlean;  right to left   ILIAC ARTERY STENT Bilateral    1990s;   bilateral common iliac artery stenting   LAPAROSCOPY ABDOMEN DIAGNOSTIC  12/03/2008   @[facility]  by dr ebbie;   Lap. lysis adhesions/  open umbilical hernia repair   LUMBAR LAMINECTOMY  09/07/2004   @[facility]  by dr pool   RENAL ANGIOGRAPHY N/A 02/14/2020   Procedure: RENAL ANGIOGRAPHY;  Surgeon: Harvey Carlin BRAVO, MD;  Location: [facility] IN[facility]ASIVE [facility];  Service: Cardiovascular;  Laterality: N/A;  Bilateral   ROBOT ASSISTED PYELOPLASTY Right 11/20/2020   Procedure: XI ROBOTIC RIGHT ASSISTED PYELOPLASTY AND STENT PLACEMENT;  Surgeon: Cam Morene ORN, MD;  Location: [facility];  Service: Urology;  Laterality: Right;   TONSILLECTOMY     child   TOTAL HIP ARTHROPLASTY Right 07/15/2020   Procedure: TOTAL HIP ARTHROPLASTY ANTERIOR APPROACH;  Surgeon: Melodi Lerner, MD;  Location: [facility];  Service: Orthopedics;  Laterality: Right;    UMBILICAL HERNIA REPAIR  2004     Social History:   reports that he has been smoking cigarettes. He has a 76.5 pack-year smoking history. He has never been exposed to tobacco smoke. He has never used smokeless tobacco. He reports that he does not currently use alcohol . He reports that he does not use drugs.   Family History:  His family history includes Breast cancer in his sister; Cancer in his mother and sister; Diabetes in his father; Hyperlipidemia in his brother, mother, and sister; Hypertension in his brother, father, mother, and sister; Kidney cancer in his mother; Other in his father; Prostate cancer in  his brother. There is no history of Colon cancer, Colon polyps, Esophageal cancer, Rectal cancer, or Stomach cancer.   Allergies Allergies[1]   Home Medications  Prior to Admission medications  Medication Sig Start Date End Date Taking? Authorizing Provider  albuterol  (VENTOLIN  HFA) 108 (90 Base) MCG/ACT inhaler INHALE 1 PUFF BY MOUTH INTO THE LUNGS TWICE DAILY AS NEEDED FOR WHEEZING OR SHORTNESS OF BREATH 08/08/24   Neysa Rama D, MD  antiseptic oral rinse (BIOTENE) LIQD 15 mLs by Mouth Rinse route daily as needed for dry mouth.     [provider]  aspirin  EC 81 MG tablet Take 81 mg by mouth daily. Swallow whole.    [provider]  Azelastine -Fluticasone  137-50 MCG/ACT SUSP Place 1-2 sprays into both nostrils at bedtime. 05/10/23   Neysa Rama D, MD  Cholecalciferol (VITAMIN D-3 PO) Take 1 capsule by mouth every other day.  [provider]  diltiazem  (DILT-XR) 120 MG 24 hr capsule Take 1 capsule (120 mg total) by mouth daily. 04/13/23   Hilty, Vinie BROCKS, MD  docusate sodium  (COLACE) 100 MG capsule Take 100 mg by mouth daily.    [provider]  esomeprazole (NEXIUM) 40 MG capsule Take 40 mg by mouth daily as needed (acid reflux/indigestion.). 02/07/19   [provider]  finasteride  (PROSCAR ) 5 MG tablet Take 1 tablet by mouth at bedtime.    [provider]  furosemide  (LASIX ) 20 MG tablet TAKE 1 TABLET(20 MG) BY MOUTH DAILY AS NEEDED 05/28/24   Hilty, Vinie BROCKS, MD  irbesartan  (AVAPRO ) 150 MG tablet Take 150 mg by mouth every evening. 11/11/19   [provider]  isosorbide  mononitrate (IMDUR ) 60 MG 24 hr tablet Take 60 mg by mouth every evening.    [provider]  loperamide  (IMODIUM  A-D) 2 MG tablet Take 2 mg by mouth as needed.    [provider]  MAGNESIUM -OXIDE 400 (240 Mg) MG tablet Take 400 mg by mouth every other day.    [provider]  Menthol , Topical Analgesic, (BLUE-EMU MAXIMUM STRENGTH  EX) Apply 1 application topically 3 (three) times daily as needed (pain.).    [provider]  Polyethyl Glycol-Propyl Glycol (SYSTANE ULTRA OP) Place 1 drop into both eyes daily as needed (Dry eye).    [provider]  potassium chloride  (KLOR-CON  M) 10 MEQ tablet Take 1 tablet on the days Lasix  is taken. 04/13/23   Hilty, Vinie BROCKS, MD  rosuvastatin (CRESTOR) 10 MG tablet Take 10 mg by mouth daily.    [provider]  simethicone  (MYLICON) 125 MG chewable tablet Chew 125 mg by mouth every 6 (six) hours as needed for flatulence. Takes gas x    [provider]  Tiotropium Bromide-Olodaterol (STIOLTO RESPIMAT ) 2.5-2.5 MCG/ACT AERS Inhale 2 puffs into the lungs daily. 06/04/24   Neysa Reggy BIRCH, MD  traMADol  (ULTRAM ) 50 MG tablet Take 1 tablet by mouth 4 (four) times daily as needed. 03/21/22   [provider]           Deward Eastern, AGACNP-BC [facility]  See Amion for personal pager PCCM on call pager [phone] until 7pm. Please call Elink 7p-7a. [phone]  11/29/2024 6:42 PM             [1]  Allergies Allergen Reactions   Penicillins     Childhood reaction  REACTION: rapid pulse Did it involve swelling of the face/tongue/throat, SOB, or low BP? Yes Did it involve sudden or severe rash/hives, skin peeling, or any reaction on the inside of your mouth or nose? Unknown Did you need to seek medical attention at a hospital or doctor's office? yes When did it last happen?      childhood  If all above answers are NO, may proceed with cephalosporin use.    Fish Oil     Other reaction(s): Heart attack symptoms   Other     Oily seafood salmon-- chest pain   Penicillin G     Other reaction(s): Unknown   Mold Extract [Trichophyton] Rash

## 2024-11-29 NOTE — Progress Notes (Signed)
 PHARMACY - ANTICOAGULATION CONSULT NOTE  Pharmacy Consult for IV heparin  Indication: pulmonary embolus  Allergies[1]  Patient Measurements: Height: 5' 5 (165.1 cm) Weight: 67.4 kg (148 lb 9.4 oz) IBW/kg (Calculated) : 61.5 HEPARIN  DW (KG): 67.4  Vital Signs: Temp: 98.4 F (36.9 C) (12/12 1314) Temp Source: Oral (12/12 1314) BP: 182/103 (12/12 1045) Pulse Rate: 59 (12/12 1200)  Labs: Recent Labs    11/29/24 0701  HGB 14.5  HCT 44.6  PLT 202  CREATININE 1.74*    Estimated Creatinine Clearance: 30.9 mL/min (A) (by C-G formula based on SCr of 1.74 mg/dL (H)).   Medical History: Past Medical History:  Diagnosis Date   AAA (abdominal aortic aneurysm) 2021   followed by dr valerie;   incidental finding infrarenal aaa 3.1 x 2.8 per abd/pelvis ct 02-26-20 epic;   last CT  03-10-2022  3.4cm   Arthritis    hips, back, neck    Benign localized prostatic hyperplasia with lower urinary tract symptoms (LUTS)    urologist--- dr nieves   CAD (coronary artery disease) 1994   cardiologist---  dr mona;   s/p angioplasty and stenting x3 1994;   nuclear stress test 10-15-2020  low risk, no ischemia, nuclear ef 59%   Chronic stable angina 2005   CKD (chronic kidney disease), stage III (HCC)    Complication of anesthesia    awareness during anesthesia;  w/ cholestectomy in 1990s pt stated thinks he stopped breathing   COPD mixed type (HCC)    pulmolonogist--- dr c. young   DDD (degenerative disc disease), cervical    Diverticulosis of colon    Dyspnea    chronic   Edema of both lower extremities    GERD (gastroesophageal reflux disease)    hx of   Hiatal hernia    History of adenomatous polyp of colon    History of kidney stones    History of lower GI bleeding 02/2022   admission in epic;   diverticular bleed , no transfusions   History of melanoma    per pt excision forehead , localized   History of prediabetes    History of tuberculosis 1966   Hyperlipidemia     Hypertension    Neuromuscular disorder (HCC)    nerve issues with back    OSA on CPAP 2000   followed by dr c. young;   sleep study in epic , severe osa;   wears cpap auto set 5 to 20   PAD (peripheral artery disease)    vascular--- dr eliza;   hx bilateral CIA stenting in 1990s;  left occluded, dr gerlean s/p  right to left fem-fem bypass 09/ 2012,  renal artery stenosis and aaa   Parotid mass    followed by dr jesus  (ENT);  per pt stable   Peripheral vascular disease    PVC's (premature ventricular contractions)    Renal artery stenosis    02-14-2020  renal angiogram left > right renal stenosis   S/P primary angioplasty with coronary stent 1994    Medications:  Medications Prior to Admission  Medication Sig Dispense Refill Last Dose/Taking   albuterol  (VENTOLIN  HFA) 108 (90 Base) MCG/ACT inhaler INHALE 1 PUFF BY MOUTH INTO THE LUNGS TWICE DAILY AS NEEDED FOR WHEEZING OR SHORTNESS OF BREATH 8.5 g 5    antiseptic oral rinse (BIOTENE) LIQD 15 mLs by Mouth Rinse route daily as needed for dry mouth.       aspirin  EC 81 MG tablet Take 81 mg by  mouth daily. Swallow whole.      Azelastine -Fluticasone  137-50 MCG/ACT SUSP Place 1-2 sprays into both nostrils at bedtime. 23 g 3    Cholecalciferol (VITAMIN D-3 PO) Take 1 capsule by mouth every other day.      diltiazem  (DILT-XR) 120 MG 24 hr capsule Take 1 capsule (120 mg total) by mouth daily. 90 capsule 2    docusate sodium  (COLACE) 100 MG capsule Take 100 mg by mouth daily.      esomeprazole (NEXIUM) 40 MG capsule Take 40 mg by mouth daily as needed (acid reflux/indigestion.).      finasteride  (PROSCAR ) 5 MG tablet Take 1 tablet by mouth at bedtime.      furosemide  (LASIX ) 20 MG tablet TAKE 1 TABLET(20 MG) BY MOUTH DAILY AS NEEDED 30 tablet 0    irbesartan  (AVAPRO ) 150 MG tablet Take 150 mg by mouth every evening.      isosorbide  mononitrate (IMDUR ) 60 MG 24 hr tablet Take 60 mg by mouth every evening.      loperamide  (IMODIUM  A-D) 2 MG  tablet Take 2 mg by mouth as needed.      MAGNESIUM -OXIDE 400 (240 Mg) MG tablet Take 400 mg by mouth every other day.      Menthol , Topical Analgesic, (BLUE-EMU MAXIMUM STRENGTH EX) Apply 1 application topically 3 (three) times daily as needed (pain.).      Polyethyl Glycol-Propyl Glycol (SYSTANE ULTRA OP) Place 1 drop into both eyes daily as needed (Dry eye).      potassium chloride  (KLOR-CON  M) 10 MEQ tablet Take 1 tablet on the days Lasix  is taken. 30 tablet 2    rosuvastatin (CRESTOR) 10 MG tablet Take 10 mg by mouth daily.      simethicone  (MYLICON) 125 MG chewable tablet Chew 125 mg by mouth every 6 (six) hours as needed for flatulence. Takes gas x      Tiotropium Bromide-Olodaterol (STIOLTO RESPIMAT ) 2.5-2.5 MCG/ACT AERS Inhale 2 puffs into the lungs daily. 4 g 12    traMADol  (ULTRAM ) 50 MG tablet Take 1 tablet by mouth 4 (four) times daily as needed.      Scheduled:   Chlorhexidine  Gluconate Cloth  6 each Topical Daily   heparin   4,000 Units Intravenous Once   potassium chloride   40 mEq Oral Once   PRN: acetaminophen  **OR** acetaminophen , hydrALAZINE , ondansetron  **OR** ondansetron  (ZOFRAN ) IV, mouth rinse  Assessment: 48 yoM with PMH AAA, CAD s/p PCI in 1994, PAD s/p stenting in 1990s and fem bypass in 2012, renal artery stenosis, CKD3, COPD, Hx LGIB, melanoma, Hx TB, presents 12/12 w/ weakness & dyspnea. CTA chest shows large L > R bilateral PE. Pharmacy consulted to start IV heparin .  Baseline INR, aPTT: not done Prior anticoagulation: none  Significant events:  Today, 11/29/2024: CBC: WNL Last pre-admit SCr from Jan 2024, but consistent with current reading No bleeding or infusion issues per nursing  Goal of Therapy: Heparin  level 0.3-0.7 units/ml Monitor platelets by anticoagulation protocol: Yes  Plan: Heparin  4000 units IV bolus x 1 Heparin  1100 units/hr IV infusion Check heparin  level 8 hrs after start Daily CBC, daily heparin  level once stable Monitor for  signs of bleeding or thrombosis  Bard Jeans, PharmD, BCPS [phone] 11/29/2024, 1:18 PM     [1]  Allergies Allergen Reactions   Penicillins     Childhood reaction  REACTION: rapid pulse Did it involve swelling of the face/tongue/throat, SOB, or low BP? Yes Did it involve sudden or severe rash/hives, skin peeling, or any  reaction on the inside of your mouth or nose? Unknown Did you need to seek medical attention at a hospital or doctor's office? yes When did it last happen?      childhood  If all above answers are NO, may proceed with cephalosporin use.    Fish Oil     Other reaction(s): Heart attack symptoms   Other     Oily seafood salmon-- chest pain   Penicillin G     Other reaction(s): Unknown   Mold Extract [Trichophyton] Rash

## 2024-11-30 ENCOUNTER — Inpatient Hospital Stay (HOSPITAL_COMMUNITY)

## 2024-11-30 DIAGNOSIS — I700 Atherosclerosis of aorta: Secondary | ICD-10-CM | POA: Diagnosis present

## 2024-11-30 DIAGNOSIS — I2699 Other pulmonary embolism without acute cor pulmonale: Secondary | ICD-10-CM | POA: Diagnosis present

## 2024-11-30 DIAGNOSIS — G4733 Obstructive sleep apnea (adult) (pediatric): Secondary | ICD-10-CM | POA: Diagnosis present

## 2024-11-30 DIAGNOSIS — R7989 Other specified abnormal findings of blood chemistry: Secondary | ICD-10-CM | POA: Diagnosis present

## 2024-11-30 DIAGNOSIS — I82403 Acute embolism and thrombosis of unspecified deep veins of lower extremity, bilateral: Secondary | ICD-10-CM | POA: Diagnosis not present

## 2024-11-30 LAB — CBC
HCT: 42.4 % (ref 39.0–52.0)
Hemoglobin: 13.6 g/dL (ref 13.0–17.0)
MCH: 30.8 pg (ref 26.0–34.0)
MCHC: 32.1 g/dL (ref 30.0–36.0)
MCV: 96.1 fL (ref 80.0–100.0)
Platelets: 187 K/uL (ref 150–400)
RBC: 4.41 MIL/uL (ref 4.22–5.81)
RDW: 14.7 % (ref 11.5–15.5)
WBC: 8.9 K/uL (ref 4.0–10.5)
nRBC: 0 % (ref 0.0–0.2)

## 2024-11-30 LAB — COMPREHENSIVE METABOLIC PANEL WITH GFR
ALT: 7 U/L (ref 0–44)
AST: 26 U/L (ref 15–41)
Albumin: 3.3 g/dL — ABNORMAL LOW (ref 3.5–5.0)
Alkaline Phosphatase: 109 U/L (ref 38–126)
Anion gap: 14 (ref 5–15)
BUN: 16 mg/dL (ref 8–23)
CO2: 28 mmol/L (ref 22–32)
Calcium: 8.3 mg/dL — ABNORMAL LOW (ref 8.9–10.3)
Chloride: 101 mmol/L (ref 98–111)
Creatinine, Ser: 1.53 mg/dL — ABNORMAL HIGH (ref 0.61–1.24)
GFR, Estimated: 47 mL/min — ABNORMAL LOW (ref >60)
Glucose, Bld: 69 mg/dL — ABNORMAL LOW (ref 70–99)
Potassium: 2.9 mmol/L — ABNORMAL LOW (ref 3.5–5.1)
Sodium: 143 mmol/L (ref 135–145)
Total Bilirubin: 0.6 mg/dL (ref 0.0–1.2)
Total Protein: 6.8 g/dL (ref 6.5–8.1)

## 2024-11-30 LAB — ECHOCARDIOGRAM COMPLETE
AR max vel: 3.05 cm2
AV Peak grad: 7 mmHg
Ao pk vel: 1.33 m/s
Area-P 1/2: 3.43 cm2
Calc EF: 65.2 %
Height: 65 in
S' Lateral: 2.6 cm
Single Plane A2C EF: 64.2 %
Single Plane A4C EF: 64.6 %
Weight: 2377.44 [oz_av]

## 2024-11-30 LAB — GLUCOSE, CAPILLARY
Glucose-Capillary: 108 mg/dL — ABNORMAL HIGH (ref 70–99)
Glucose-Capillary: 177 mg/dL — ABNORMAL HIGH (ref 70–99)
Glucose-Capillary: 59 mg/dL — ABNORMAL LOW (ref 70–99)
Glucose-Capillary: 78 mg/dL (ref 70–99)
Glucose-Capillary: 81 mg/dL (ref 70–99)
Glucose-Capillary: 82 mg/dL (ref 70–99)
Glucose-Capillary: 85 mg/dL (ref 70–99)
Glucose-Capillary: 98 mg/dL (ref 70–99)

## 2024-11-30 LAB — TROPONIN T, HIGH SENSITIVITY
Troponin T High Sensitivity: 149 ng/L (ref 0–19)
Troponin T High Sensitivity: 146 ng/L (ref 0–19)

## 2024-11-30 LAB — MAGNESIUM: Magnesium: 2.3 mg/dL (ref 1.7–2.4)

## 2024-11-30 LAB — PRO BRAIN NATRIURETIC PEPTIDE: Pro Brain Natriuretic Peptide: 7251 pg/mL — ABNORMAL HIGH (ref <300.0)

## 2024-11-30 LAB — PHOSPHORUS: Phosphorus: 3.5 mg/dL (ref 2.5–4.6)

## 2024-11-30 LAB — HEPARIN LEVEL (UNFRACTIONATED): Heparin Unfractionated: 0.61 [IU]/mL (ref 0.30–0.70)

## 2024-11-30 MED ORDER — LABETALOL HCL 5 MG/ML IV SOLN
10.0000 mg | INTRAVENOUS | Status: DC | PRN
  Administered 2024-12-01 – 2024-12-04 (×8): 10 mg via INTRAVENOUS
  Filled 2024-11-30 (×8): qty 4

## 2024-11-30 MED ORDER — BUDESONIDE 0.25 MG/2ML IN SUSP
0.2500 mg | Freq: Two times a day (BID) | RESPIRATORY_TRACT | Status: DC
  Administered 2024-11-30 – 2024-12-04 (×9): 0.25 mg via RESPIRATORY_TRACT
  Filled 2024-11-30 (×9): qty 2

## 2024-11-30 MED ORDER — KCL IN DEXTROSE-NACL 40-5-0.9 MEQ/L-%-% IV SOLN
INTRAVENOUS | Status: AC
  Filled 2024-11-30: qty 1000

## 2024-11-30 MED ORDER — DEXTROSE-SODIUM CHLORIDE 5-0.9 % IV SOLN: INTRAVENOUS | Status: DC

## 2024-11-30 MED ORDER — FUROSEMIDE 10 MG/ML IJ SOLN
20.0000 mg | Freq: Once | INTRAMUSCULAR | Status: AC
  Administered 2024-11-30: 20 mg via INTRAVENOUS
  Filled 2024-11-30: qty 2

## 2024-11-30 MED ORDER — SODIUM CHLORIDE 3 % IN NEBU
4.0000 mL | INHALATION_SOLUTION | RESPIRATORY_TRACT | Status: DC
  Administered 2024-11-30 – 2024-12-04 (×21): 4 mL via RESPIRATORY_TRACT
  Filled 2024-11-30 (×21): qty 4

## 2024-11-30 MED ORDER — IPRATROPIUM-ALBUTEROL 0.5-2.5 (3) MG/3ML IN SOLN
3.0000 mL | RESPIRATORY_TRACT | Status: DC
  Administered 2024-11-30 – 2024-12-02 (×11): 3 mL via RESPIRATORY_TRACT
  Filled 2024-11-30 (×11): qty 3

## 2024-11-30 MED ORDER — POTASSIUM CHLORIDE 10 MEQ/100ML IV SOLN
10.0000 meq | INTRAVENOUS | Status: AC
  Administered 2024-11-30 (×4): 10 meq via INTRAVENOUS
  Filled 2024-11-30 (×4): qty 100

## 2024-11-30 MED ORDER — ARFORMOTEROL TARTRATE 15 MCG/2ML IN NEBU
15.0000 ug | INHALATION_SOLUTION | Freq: Two times a day (BID) | RESPIRATORY_TRACT | Status: DC
  Administered 2024-11-30 – 2024-12-04 (×9): 15 ug via RESPIRATORY_TRACT
  Filled 2024-11-30 (×9): qty 2

## 2024-11-30 MED ORDER — DEXTROSE 50 % IV SOLN
12.5000 g | INTRAVENOUS | Status: AC
  Administered 2024-11-30: 12.5 g via INTRAVENOUS
  Filled 2024-11-30: qty 50

## 2024-11-30 MED ORDER — REVEFENACIN 175 MCG/3ML IN SOLN
175.0000 ug | Freq: Every day | RESPIRATORY_TRACT | Status: DC
  Administered 2024-11-30 – 2024-12-04 (×5): 175 ug via RESPIRATORY_TRACT
  Filled 2024-11-30 (×5): qty 3

## 2024-11-30 MED ORDER — POTASSIUM CHLORIDE CRYS ER 20 MEQ PO TBCR: 40.0000 meq | EXTENDED_RELEASE_TABLET | Freq: Two times a day (BID) | ORAL | Status: DC

## 2024-11-30 MED ORDER — SODIUM CHLORIDE 3 % IN NEBU
4.0000 mL | INHALATION_SOLUTION | Freq: Two times a day (BID) | RESPIRATORY_TRACT | Status: DC
  Administered 2024-11-30: 4 mL via RESPIRATORY_TRACT
  Filled 2024-11-30: qty 4

## 2024-11-30 MED ORDER — IOHEXOL 350 MG/ML SOLN
75.0000 mL | Freq: Once | INTRAVENOUS | Status: AC | PRN
  Administered 2024-11-30: 60 mL via INTRAVENOUS

## 2024-11-30 NOTE — Hospital Course (Signed)
 Dean PARAS Show Mickey. is a 78 y.o. male with medical history significant of AAA, osteoarthritis of the hip, DDD of the cervical and lumbar spine, CAD, history of coronary stent placement, stage III CKD, mixed type COPD, diverticulosis, chronic dyspnea, lower extremity edema, GERD/hiatal hernia, colon polyp, nephrolithiasis, history of lower GI bleed, history of melanoma, history of prediabetes, history of tuberculosis, hyperlipidemia, hypertension, OSA on CPAP, PAD, parotid mass PVCs, renal artery stenosis who presented to the emergency department complaints of generalized weakness and dyspnea. Found to have Bilateral DVTs.  Placed on anticoagulation and is on extremely high amount of oxygen .  Oxygen  was slowly weaning and he was on 30 L 60% FiO2 but worsened to 40 L 100% of FiO2. Pulm is recommending Abx and further adjustments as below. Respiratory Status very tenuous. Palliative Care consulted and continuing to have GOC discussions.   Assessment and Plan:  Pulmonary embolism (HCC)  Associated with Elevated brain natriuretic peptide (BNP) level And  Elevated troponin and Bilateral LE DVTs: No heart strain.  RV to LV ratio 0.9. Stepdown/inpatient.  -Supplemental oxygen  as needed and was worsening. Had to be put on 12 L HFNC + NRB Continue heparin  infusion but significantly worsened -Hydromorphone  as needed for pain. -Obtained Echocardiogram and showed EF of 60-65% G1DD and Normal RVSF.  -Obtained lower extremity Doppler aand had Acute DVT involving the right  posterior tibial veins, right peroneal veins, and right femoral vein w/ Cystic structure is found in the popliteal fossa and on there was DVT involving the left femoral vein, left posterior tibial veins, and left peroneal veins.  -CTA PE repeated and showed Stable bilateral central pulmonary emboli, left greater than right, extending to the segmental and subsegmental levels of the left lower, left upper, right middle, and right lower lobes. Interval  development of bubbly left lower lobe consolidation, likely representing developing pulmonary infarction. No right heart strain. -Pro-BNP went from 9,331.0 -> 7,251.0 -> 4,026.0 and Troponin T went from 132 -> 146 -PCCM and IR consulted. Received a Dose of IV Lasix  20 mg x1. IR recommends no thrombectomy and recommends A/C alone but recommending notifying IR if has Clinical Sx of Worsening PE burden or Hemodynamic Changes; -Hypercoagulable workup done and PTT lupus anticoagulant was 47.9, normal DRVVT was 87.6, DRVVT mix was 52.6, DRVVT confirm was 1.4, total protein C was 84, total protein S was 97, PTT LMX was 43.6 and PTT lupus  -Heparin  drip at this time being transitioned to Enoxaparin  -Pulm recommends to continue ICS/LAMA/LAMA and hypertonic saline nebs 4 times daily as well as DuoNebs 4 times daily and aggressive PT and wean O2 as tolerated. Today after the pulmonologist reviewed the CT of the chest they feel that the patient has an evolving left lower lobe infiltrate with air bronchograms concerning for pneumonia so they are recommending starting Abx and obtaining a Sputum Cx (Pending) and RVP (Negative). Initiated on IV Vac/ Cefepime  but since MRSA negative Vanc Stopped.  -Pulmonary also ordered LTD ECHO w/ Bubble to r/o Shunt and this was negative, with no evidence  of any interatrial shunt.  -SpO2: 92 % O2 Flow Rate (L/min): 40 L/min FiO2 (%): 100 % -Patient is on Stiolto at home and pulmonary recommends that we may need to transition to Trelegy at discharge -He went from 50 L and 80% FiO2 HHFNC to 30 L and 60% FiO2 HHFNC yesterday but started desaturating today and is now back on 40 L 100% FiO2 HHFNC; CXR showed worsening B/L opacities  -Palliative  care consulted for goals of care discussion and patient is now DNR/DNI with limited interventions and allowing for comfort feeds.  After discussion with the family there is going to be a time-limited trial of current interventions for next 2 to 3  days and consideration of SNF rehabilitation with palliative but he declines further than recommendation will be for residential hospice facility and comfort care -PT/OT recommending SNF but no medically stable   Hypokalemia: K+ improved and is now 4.0. CTM and Replete as Necessary. Repeat CMP in the AM   Hypoglycemia: In the setting of being NPO. Restart cautious IVF with D5 in LR @ 75 mL/hr  Tobacco Abuse: May use NicoDerm or Nicorette gum.  AKI on CKD Stage 3a: Around his Baseline of 1.4-1.7. Recent Labs  Lab 11/29/24 0701 11/30/24 0249 12/01/24 0304 12/02/24 0302 12/03/24 0306  BUN 15 16 18 19 22   CREATININE 1.74* 1.53* 1.85* 1.61* 1.59*  -IVF as above  -Avoid Nephrotoxic Medications, Contrast Dyes, Hypotension and Dehydration to Ensure Adequate Renal Perfusion and will need to Renally Adjust Meds -Continue to Monitor and Trend Renal Function carefully and repeat CMP in the AM   Obstructive sleep apnea: Not on CPAP. PCCM recommending c/w HFNC @ night.    Essential HTN: On Amlodipine  10 mg p.o. daily and On Avapro  150 mg p.o. daily Will resume once cleared for oral intake. CTM BP per Protocol. Last BP reading was 148/123   Dysphagia: He was kept n.p.o. and SLP evaluated and recommended keeping n.p.o. except meds for now; may allow comfort feeds per prior discussion.  SLP to follow-up again and he is on too much oxygen  to do an MBS; SLP still recommending NPO   Coronary atherosclerosis: Has been on Aspirin , Amlodipine , Imdur  and Statin.   COPD mixed type [employer]): Pulmonary recommending Budesonide , Arfomoterol, and Revefenacin  along with DuoNeb QID and Hypertonic Saline Nebs. See above   GERD/GI Prophylaxis: Antiacid, H2 blocker or PPI as needed.   PAD (Peripheral Artery Disease): Discontinued aspirin  while on AC. Continue statin if cleared by SLP    Hyperlipidemia / Aortic Atherosclerosis: On Rosuvastatin but currently on hold.   Normocytic Anemia: Hgb/Hct Trend dropping from  admission (14.5/44.6) and is now 11.7/36.8. ? Dilutional Drop. Check Anemia Panel in the AM. CTM for S/Sx of Bleeding; No overt bleeding noted. Repeat CBC in the AM    Pressure injury of skin: Continue local care and preventive measures.  Wound 11/29/24 1431 Pressure Injury Heel Right Stage 1 -  Intact skin with non-blanchable redness of a localized area usually over a bony prominence. (Active)     Wound 11/29/24 1432 Pressure Injury Buttocks Medial Stage 1 -  Intact skin with non-blanchable redness of a localized area usually over a bony prominence. (Active)   Hypoalbuminemia: Patient's Albumin Level dropping since admission (3.5) and is now 3.0. CTM & Trend & repeat CMP in the AM

## 2024-11-30 NOTE — Progress Notes (Signed)
 PROGRESS NOTE    Dean JINNY [name].  FMW:[id number] DOB: 1946/06/30 DOA: 11/29/2024 PCP: Shepard Ade, MD   Brief Narrative:  Dean JINNY [name]. is a 78 y.o. male with medical history significant of AAA, osteoarthritis of the hip, DDD of the cervical and lumbar spine, CAD, history of coronary stent placement, stage III CKD, mixed type COPD, diverticulosis, chronic dyspnea, lower extremity edema, GERD/hiatal hernia, colon polyp, nephrolithiasis, history of lower GI bleed, history of melanoma, history of prediabetes, history of tuberculosis, hyperlipidemia, hypertension, OSA on CPAP, PAD, parotid mass PVCs, renal artery stenosis who presented to the emergency department complaints of generalized weakness and dyspnea. Found to have Bilateral DVTs  Assessment and Plan:  Pulmonary embolism (HCC)  Associated with Elevated brain natriuretic peptide (BNP) level And  Elevated troponin and Bilateral LE DVTs: No heart strain.  RV to LV ratio 0.9. Stepdown/inpatient.  -Supplemental oxygen  as needed and was worsening. Had to be put on 12 L HFNC + NRB Continue heparin  infusion. Hydromorphone  as needed for pain. Obtained Echocardiograma and showed EF of 60-65% G1DD and Normal RVSF. Obtained lower extremity Doppler aand had Acute DVT involving the right  posterior tibial veins, right peroneal veins, and right femoral vein w/ Cystic structure is found in the popliteal fossa and on there was DVT involving the left femoral vein, left posterior tibial veins, and left peroneal veins. CTA PE repeated and showed Stable bilateral central pulmonary emboli, left greater than right, extending to the segmental and subsegmental levels of the left lower, left upper, right middle, and right lower lobes. Interval development of bubbly left lower lobe consolidation, likely representing developing pulmonary infarction. No right heart strain. -Pro-BNP went from 9,331.0 -> 7,251.0 and Troponin T went from 132 -> 146 -PCCM  and IR consulted. Received a Dose of IV Lasix  20 mg x1. IR recommends no thrombectomy and recommends A/C alone but recommending notifying IR if has Clinical Sx of Worsening PE burden or Hemodynamic Changes  Hypokalemia: K+ Trend:  Recent Labs  Lab 11/29/24 0701 11/30/24 0249  K 2.9* 2.9*  -Replete. CTM and Replete as Necessary. Repeat CMP in the AM   Hypoglycemia: In the setting of being NPO. Start IVF w/ D5 NS + 40 mEQ KCL @ 75 mL/hr x 12 hours  Tobacco Abuse: May use NicoDerm or Nicorette gum.  AKI on CKD Stage 3a:  Recent Labs  Lab 11/29/24 0701 11/30/24 0249  BUN 15 16  CREATININE 1.74* 1.53*  -IVF as above -Avoid Nephrotoxic Medications, Contrast Dyes, Hypotension and Dehydration to Ensure Adequate Renal Perfusion and will need to Renally Adjust Meds -Continue to Monitor and Trend Renal Function carefully and repeat CMP in the AM    Obstructive sleep apnea: Not on CPAP.   Essential hypertension: On amlodipine  10 mg p.o. daily and On Avapro  150 mg p.o. daily Will resume once cleared for oral intake. CTM BP per Protocol. Last BP reading was    Dysphagia: Will keep n.p.o. for now. SLP consulted and recommending to keep NPO except meds   Coronary atherosclerosis: Has been on aspirin , amlodipine , Imdur  and statin.   COPD mixed type [facility]): Pulmonary recommending Budesonide , Arfomoterol, and Revefenacin  along with DuoNeb QID and Hypertonic Saline Nebs.   GERD/GI Prophylaxis: Antiacid, H2 blocker or PPI as needed.   PAD (peripheral artery disease): Discontinue aspirin  while on AC. Continue statin if cleared by SLP    Hyperlipidemia / Aortic atherosclerosis: On rosuvastatin.   Pressure injury of skin: Continue local care and preventive  measures.  Wound 11/29/24 1431 Pressure Injury Heel Right Stage 1 -  Intact skin with non-blanchable redness of a localized area usually over a bony prominence. (Active)     Wound 11/29/24 1432 Pressure Injury Buttocks Medial Stage 1 -  Intact  skin with non-blanchable redness of a localized area usually over a bony prominence. (Active)   Hypoalbuminemia: Patient's Albumin Trend Recent Labs  Lab 11/29/24 0701 11/30/24 0249  ALBUMIN 3.5 3.3*  -Continue to Monitor and Trend and repeat CMP in the AM   DVT prophylaxis: Anticoagulated with Heparin  gtt    Code Status: Full Code Family Communication: No family present @ bedside  Disposition Plan:  Level of care: Stepdown Status is: Inpatient Remains inpatient appropriate because: Needs further clinical improvement   Consultants:  PCCM IR  Procedures:  As delineated as above  Antimicrobials:  Anti-infectives (From admission, onward)    None       Subjective: Seen and examined at bedside and was SOB and a little drowsy. No lightheadedness. Complaining of some Chest discomfort. No Nausea or vomiting.   Objective: Vitals:   11/30/24 1932 11/30/24 1935 11/30/24 1937 11/30/24 1939  BP:      Pulse:      Resp:      Temp:  97.7 F (36.5 C)    TempSrc:  Oral    SpO2: 99% 96% 96% 97%  Weight:      Height:        Intake/Output Summary (Last 24 hours) at 11/30/2024 2024 Last data filed at 11/30/2024 1500 Gross per 24 hour  Intake 582.52 ml  Output 350 ml  Net 232.52 ml   Filed Weights   11/29/24 1314  Weight: 67.4 kg   Examination: Physical Exam:  Constitutional: Chronically ill appearing Caucasian male who is in some respiratory distress Respiratory: Diminished to auscultation bilaterally with coarse breath sounds and has some rhonchi and but no wheezing, rales, or crackles. Iincreased Respiratory Effort and wearing HFNC + NRB Cardiovascular: RRR, no murmurs / rubs / gallops. S1 and S2 auscultated. 1+ LE B/L edema Abdomen: Soft, non-tender, non-distended. Bowel sounds positive.  GU: Deferred. Musculoskeletal: No clubbing / cyanosis of digits/nails. No joint deformity upper and lower extremities.  Skin: No rashes, lesions, ulcers on a limited skin  evaluation. No induration; Warm and dry.  Neurologic: A little somnolent and drowsy Psychiatric: Appears fatigued but calm  Data Reviewed: I have personally reviewed following labs and imaging studies  CBC: Recent Labs  Lab 11/29/24 0701 11/30/24 0249  WBC 8.6 8.9  NEUTROABS 6.5  --   HGB 14.5 13.6  HCT 44.6 42.4  MCV 97.2 96.1  PLT 202 187   Basic Metabolic Panel: Recent Labs  Lab 11/29/24 0701 11/29/24 0925 11/30/24 0249 11/30/24 0810  NA 146*  --  143  --   K 2.9*  --  2.9*  --   CL 101  --  101  --   CO2 29  --  28  --   GLUCOSE 72  --  69*  --   BUN 15  --  16  --   CREATININE 1.74*  --  1.53*  --   CALCIUM  8.9  --  8.3*  --   MG  --  1.8  --  2.3  PHOS  --  3.4  --  3.5   GFR: Estimated Creatinine Clearance: 35.2 mL/min (A) (by C-G formula based on SCr of 1.53 mg/dL (H)). Liver Function Tests: Recent Labs  Lab  11/29/24 0701 11/30/24 0249  AST 30 26  ALT 7 7  ALKPHOS 118 109  BILITOT 0.6 0.6  PROT 7.2 6.8  ALBUMIN 3.5 3.3*   No results for input(s): LIPASE, AMYLASE in the last 168 hours. No results for input(s): AMMONIA in the last 168 hours. Coagulation Profile: No results for input(s): INR, PROTIME in the last 168 hours. Cardiac Enzymes: No results for input(s): CKTOTAL, CKMB, CKMBINDEX, TROPONINI in the last 168 hours. BNP (last 3 results) Recent Labs    11/29/24 0701 11/30/24 0810  PROBNP 9,331.0* 7,251.0*   HbA1C: No results for input(s): HGBA1C in the last 72 hours. CBG: Recent Labs  Lab 11/30/24 0515 11/30/24 0555 11/30/24 1152 11/30/24 1627 11/30/24 1821  GLUCAP 59* 177* 85 81 78   Lipid Profile: No results for input(s): CHOL, HDL, LDLCALC, TRIG, CHOLHDL, LDLDIRECT in the last 72 hours. Thyroid Function Tests: No results for input(s): TSH, T4TOTAL, FREET4, T3FREE, THYROIDAB in the last 72 hours. Anemia Panel: No results for input(s): VITAMINB12, FOLATE, FERRITIN, TIBC,  IRON, RETICCTPCT in the last 72 hours. Sepsis Labs: No results for input(s): PROCALCITON, LATICACIDVEN in the last 168 hours.  Recent Results (from the past 240 hours)  Resp panel by RT-PCR (RSV, Flu A&B, Covid) Anterior Nasal Swab     Status: None   Collection Time: 11/29/24  6:53 AM   Specimen: Anterior Nasal Swab  Result Value Ref Range Status   SARS Coronavirus 2 by RT PCR NEGATIVE NEGATIVE Final    Comment: (NOTE) SARS-CoV-2 target nucleic acids are NOT DETECTED.  The SARS-CoV-2 RNA is generally detectable in upper respiratory specimens during the acute phase of infection. The lowest concentration of SARS-CoV-2 viral copies this assay can detect is 138 copies/mL. A negative result does not preclude SARS-Cov-2 infection and should not be used as the sole basis for treatment or other patient management decisions. A negative result may occur with  improper specimen collection/handling, submission of specimen other than nasopharyngeal swab, presence of viral mutation(s) within the areas targeted by this assay, and inadequate number of viral copies(<138 copies/mL). A negative result must be combined with clinical observations, patient history, and epidemiological information. The expected result is Negative.  Fact Sheet for Patients:  bloggercourse.com  Fact Sheet for Healthcare Providers:  seriousbroker.it  This test is no t yet approved or cleared by the [place]  FDA and  has been authorized for detection and/or diagnosis of SARS-CoV-2 by FDA under an Emergency Use Authorization (EUA). This EUA will remain  in effect (meaning this test can be used) for the duration of the COVID-19 declaration under Section 564(b)(1) of the Act, 21 U.S.C.section 360bbb-3(b)(1), unless the authorization is terminated  or revoked sooner.       Influenza A by PCR NEGATIVE NEGATIVE Final   Influenza B by PCR NEGATIVE NEGATIVE Final     Comment: (NOTE) The Xpert Xpress SARS-CoV-2/FLU/RSV plus assay is intended as an aid in the diagnosis of influenza from Nasopharyngeal swab specimens and should not be used as a sole basis for treatment. Nasal washings and aspirates are unacceptable for Xpert Xpress SARS-CoV-2/FLU/RSV testing.  Fact Sheet for Patients: bloggercourse.com  Fact Sheet for Healthcare Providers: seriousbroker.it  This test is not yet approved or cleared by the [place]  FDA and has been authorized for detection and/or diagnosis of SARS-CoV-2 by FDA under an Emergency Use Authorization (EUA). This EUA will remain in effect (meaning this test can be used) for the duration of the COVID-19 declaration under Section 564(b)(1)  of the Act, 21 U.S.C. section 360bbb-3(b)(1), unless the authorization is terminated or revoked.     Resp Syncytial Virus by PCR NEGATIVE NEGATIVE Final    Comment: (NOTE) Fact Sheet for Patients: bloggercourse.com  Fact Sheet for Healthcare Providers: seriousbroker.it  This test is not yet approved or cleared by the [place]  FDA and has been authorized for detection and/or diagnosis of SARS-CoV-2 by FDA under an Emergency Use Authorization (EUA). This EUA will remain in effect (meaning this test can be used) for the duration of the COVID-19 declaration under Section 564(b)(1) of the Act, 21 U.S.C. section 360bbb-3(b)(1), unless the authorization is terminated or revoked.  Performed at [facility], [address] [place], [place] [address]   MRSA Next Gen by PCR, Nasal     Status: None   Collection Time: 11/29/24  1:27 PM   Specimen: Nasal Mucosa; Nasal Swab  Result Value Ref Range Status   MRSA by PCR Next Gen NOT DETECTED NOT DETECTED Final    Comment: (NOTE) The GeneXpert MRSA Assay (FDA approved for NASAL specimens only), is one component  of a comprehensive MRSA colonization surveillance program. It is not intended to diagnose MRSA infection nor to guide or monitor treatment for MRSA infections. Test performance is not FDA approved in patients less than 90 years old. Performed at [facility], [address] [place], [place] [address]     Radiology Studies: CT Angio Chest Pulmonary Embolism (PE) W or WO Contrast Result Date: 11/30/2024 EXAM: CTA of the Chest with contrast for PE 11/30/2024 01:22:29 PM TECHNIQUE: CTA of the chest was performed after the administration of 60 mL of iohexol  (OMNIPAQUE ) 350 MG/ML injection. Multiplanar reformatted images are provided for review. MIP images are provided for review. Automated exposure control, iterative reconstruction, and/or weight based adjustment of the mA/kV was utilized to reduce the radiation dose to as low as reasonably achievable. COMPARISON: None available. CLINICAL HISTORY: Pulmonary embolism (PE) suspected, high prob. FINDINGS: PULMONARY ARTERIES: Pulmonary arteries are adequately opacified for evaluation. Stable bilateral central pulmonary emboli, left greater than right extending to the segmental and subsegmental levels of the left lower, left upper, right middle, right lower lobes. Main pulmonary artery is normal in caliber. MEDIASTINUM: The heart demonstrates a normal RV to LV ratio. There is 4-vessel coronary artery calcification and severe atherosclerotic plaque. The pericardium demonstrates no acute abnormality. There is no acute abnormality of the thoracic aorta. LYMPH NODES: No mediastinal, hilar or axillary lymphadenopathy. LUNGS AND PLEURA: Interval development of associated bubbly left lower lobe consolidation that likely represents developing pulmonary infarction. No pleural effusion or pneumothorax. UPPER ABDOMEN: Limited images of the upper abdomen demonstrate status post cholecystectomy. Lesions within the left kidney likely represent simple renal  cysts. Simple renal cysts do not require additional follow-up unless clinically indicated due to signs/symptoms. SOFT TISSUES AND BONES: No acute bone or soft tissue abnormality. IMPRESSION: 1. Stable bilateral central pulmonary emboli, left greater than right, extending to the segmental and subsegmental levels of the left lower, left upper, right middle, and right lower lobes. 2. Interval development of bubbly left lower lobe consolidation, likely representing developing pulmonary infarction. 3. No right heart strain. Electronically signed by: Morgane Naveau MD 11/30/2024 01:40 PM EST RP Workstation: [id number]   ECHOCARDIOGRAM COMPLETE Result Date: 11/30/2024    ECHOCARDIOGRAM REPORT   Patient Name:   Dean Misner. Date of Exam: 11/30/2024 Medical Rec #:  [id number]  Height:       65.0 in Accession #:    [id number]            Weight:       148.6 lb Date of Birth:  1946-02-15             BSA:          1.743 m Patient Age:    78 years              BP:           183/65 mmHg Patient Gender: M                     HR:           70 bpm. Exam Location:  Inpatient Procedure: 2D Echo, Cardiac Doppler and Color Doppler (Both Spectral and Color            Flow Doppler were utilized during procedure). Indications:    Pulmonary Embolus                 Elevated Troponin  History:        Patient has prior history of Echocardiogram examinations, most                 recent 11/25/2022. CAD, Prior CABG, COPD and PAD, Arrythmias:PVC,                 Signs/Symptoms:Dyspnea; Risk Factors:Hypertension, Dyslipidemia,                 Sleep Apnea and Current Smoker. Abdominal Aortic Aneurysm.  Sonographer:    Logan Shove RDCS Referring Phys: [id number] DAVID MANUEL ORTIZ IMPRESSIONS  1. Left ventricular ejection fraction, by estimation, is 60 to 65%. The left ventricle has normal function. The left ventricle has no regional wall motion abnormalities. There is moderate concentric left ventricular hypertrophy. Left  ventricular diastolic parameters are consistent with Grade I diastolic dysfunction (impaired relaxation).  2. Right ventricular systolic function is normal. The right ventricular size is normal.  3. Left atrial size was mildly dilated.  4. Right atrial size was mildly dilated.  5. The mitral valve is normal in structure. Trivial mitral valve regurgitation. No evidence of mitral stenosis.  6. The aortic valve is tricuspid. Aortic valve regurgitation is not visualized. Aortic valve sclerosis is present, with no evidence of aortic valve stenosis.  7. The inferior vena cava is normal in size with greater than 50% respiratory variability, suggesting right atrial pressure of 3 mmHg. FINDINGS  Left Ventricle: Left ventricular ejection fraction, by estimation, is 60 to 65%. The left ventricle has normal function. The left ventricle has no regional wall motion abnormalities. The left ventricular internal cavity size was normal in size. There is  moderate concentric left ventricular hypertrophy. Left ventricular diastolic parameters are consistent with Grade I diastolic dysfunction (impaired relaxation). Right Ventricle: The right ventricular size is normal. Right ventricular systolic function is normal. Left Atrium: Left atrial size was mildly dilated. Right Atrium: Right atrial size was mildly dilated. Pericardium: There is no evidence of pericardial effusion. Mitral Valve: The mitral valve is normal in structure. Mild mitral annular calcification. Trivial mitral valve regurgitation. No evidence of mitral valve stenosis. Tricuspid Valve: The tricuspid valve is normal in structure. Tricuspid valve regurgitation is trivial. No evidence of tricuspid stenosis. Aortic Valve: The aortic valve is tricuspid. Aortic valve regurgitation is not visualized. Aortic valve sclerosis is present, with no evidence of aortic valve stenosis.  Aortic valve peak gradient measures 7.0 mmHg. Pulmonic Valve: The pulmonic valve was normal in  structure. Pulmonic valve regurgitation is not visualized. No evidence of pulmonic stenosis. Aorta: The aortic root is normal in size and structure. Venous: The inferior vena cava is normal in size with greater than 50% respiratory variability, suggesting right atrial pressure of 3 mmHg. IAS/Shunts: No atrial level shunt detected by color flow Doppler.  LEFT VENTRICLE PLAX 2D LVIDd:         4.70 cm      Diastology LVIDs:         2.60 cm      LV e' medial:   2.72 cm/s LV PW:         1.30 cm      LV E/e' medial: 24.7 LV IVS:        1.20 cm LVOT diam:     2.10 cm LVOT Area:     3.46 cm  LV Volumes (MOD) LV vol d, MOD A2C: 144.0 ml LV vol d, MOD A4C: 137.0 ml LV vol s, MOD A2C: 51.5 ml LV vol s, MOD A4C: 48.5 ml LV SV MOD A2C:     92.5 ml LV SV MOD A4C:     137.0 ml LV SV MOD BP:      94.3 ml RIGHT VENTRICLE             IVC RV Basal diam:  3.70 cm     IVC diam: 1.90 cm RV S prime:     16.73 cm/s TAPSE (M-mode): 2.8 cm      PULMONARY VEINS                             Systolic Velocity: 2.61 cm/s LEFT ATRIUM             Index        RIGHT ATRIUM           Index LA diam:        2.70 cm 1.55 cm/m   RA Area:     21.80 cm LA Vol (A2C):   88.3 ml 50.65 ml/m  RA Volume:   76.30 ml  43.76 ml/m LA Vol (A4C):   65.2 ml 37.40 ml/m LA Biplane Vol: 78.2 ml 44.85 ml/m  AORTIC VALVE AV Area (Vmax): 3.05 cm AV Vmax:        132.67 cm/s AV Peak Grad:   7.0 mmHg LVOT Vmax:      117.00 cm/s  AORTA Ao Root diam: 3.20 cm Ao Asc diam:  3.70 cm MITRAL VALVE MV Area (PHT): 3.43 cm     SHUNTS MV Decel Time: 221 msec     Systemic Diam: 2.10 cm MV E velocity: 67.10 cm/s MV A velocity: 104.00 cm/s MV E/A ratio:  0.65 Redell Shallow MD Electronically signed by Redell Shallow MD Signature Date/Time: 11/30/2024/11:25:03 AM    Final    VAS US  LOWER EXTREMITY VENOUS (DVT) Result Date: 11/30/2024  Lower Venous DVT Study Patient Name:  Dean Wehner.  Date of Exam:   11/29/2024 Medical Rec #: [id number]              Accession #:     [id number] Date of Birth: 1946-10-05              Patient Gender: M Patient Age:   54 years Exam Location:  [facility] Procedure:      VAS US   LOWER EXTREMITY VENOUS (DVT) Referring Phys: DAVID ORTIZ --------------------------------------------------------------------------------  Indications: Pulmonary embolism.  Comparison Study: Prior negative bilateral LEV done 01/14/21 Performing Technologist: Alberta Lis RVS  Examination Guidelines: A complete evaluation includes B-mode imaging, spectral Doppler, color Doppler, and power Doppler as needed of all accessible portions of each vessel. Bilateral testing is considered an integral part of a complete examination. Limited examinations for reoccurring indications may be performed as noted. The reflux portion of the exam is performed with the patient in reverse Trendelenburg.  +---------+---------------+---------+-----------+----------+--------------+ RIGHT    CompressibilityPhasicitySpontaneityPropertiesThrombus Aging +---------+---------------+---------+-----------+----------+--------------+ CFV      Full           Yes      Yes                                 +---------+---------------+---------+-----------+----------+--------------+ SFJ      Full                                                        +---------+---------------+---------+-----------+----------+--------------+ FV Prox  Full           Yes      No                                  +---------+---------------+---------+-----------+----------+--------------+ FV Mid   Full                                                        +---------+---------------+---------+-----------+----------+--------------+ FV DistalFull           Yes      Yes                                 +---------+---------------+---------+-----------+----------+--------------+ PFV      Full           Yes      Yes                                  +---------+---------------+---------+-----------+----------+--------------+ POP                     Yes      Yes                                 +---------+---------------+---------+-----------+----------+--------------+ PTV      None                                         Acute          +---------+---------------+---------+-----------+----------+--------------+ PERO     None  Acute          +---------+---------------+---------+-----------+----------+--------------+   +---------+---------------+---------+-----------+----------+-------------------+ LEFT     CompressibilityPhasicitySpontaneityPropertiesThrombus Aging      +---------+---------------+---------+-----------+----------+-------------------+ CFV      Full           Yes      Yes                                      +---------+---------------+---------+-----------+----------+-------------------+ SFJ      Full                                                             +---------+---------------+---------+-----------+----------+-------------------+ FV Prox  Partial        Yes      No                   acute, poorly                                                             adhered             +---------+---------------+---------+-----------+----------+-------------------+ FV Mid   Partial                                      acute, poorly                                                             adhered             +---------+---------------+---------+-----------+----------+-------------------+ FV DistalFull           Yes      No                                       +---------+---------------+---------+-----------+----------+-------------------+ PFV      Full           Yes      No                                       +---------+---------------+---------+-----------+----------+-------------------+ POP      Full           Yes       No                                       +---------+---------------+---------+-----------+----------+-------------------+ PTV      None  Acute               +---------+---------------+---------+-----------+----------+-------------------+ PERO     None                                         Acute               +---------+---------------+---------+-----------+----------+-------------------+ Gastroc  Full                                                             +---------+---------------+---------+-----------+----------+-------------------+ EIV                     Yes      No                   patent by color and                                                       Doppler             +---------+---------------+---------+-----------+----------+-------------------+     Summary: RIGHT: - Findings consistent with acute deep vein thrombosis involving the right posterior tibial veins, right peroneal veins, and right femoral vein.  - A cystic structure is found in the popliteal fossa.  LEFT: - Findings consistent with acute deep vein thrombosis involving the left femoral vein, left posterior tibial veins, and left peroneal veins.  - No cystic structure found in the popliteal fossa.  *See table(s) above for measurements and observations. Electronically signed by Gaile New MD on 11/30/2024 at 10:50:28 AM.    Final    DG CHEST PORT 1 VIEW Result Date: 11/30/2024 EXAM: 1 VIEW(S) XRAY OF THE CHEST 11/30/2024 10:02:00 AM COMPARISON: 11/29/2024 CLINICAL HISTORY: 78 year old male with acute pulmonary emboli. Hypoxia FINDINGS: LUNGS AND PLEURA: Emphysema. Increasing left basilar opacity. Possible small left pleural effusion. No pneumothorax. HEART AND MEDIASTINUM: Atherosclerotic calcifications. No acute abnormality of the cardiac and mediastinal silhouettes. BONES AND SOFT TISSUES: No acute osseous abnormality. IMPRESSION: 1. Increasing left  basilar opacity, suspicious for infarction in this clinical setting of acute pulmonary emboli. 2. Possible small left pleural effusion. 3. Emphysema. Electronically signed by: Helayne Hurst MD 11/30/2024 10:47 AM EST RP Workstation: [id number]   CT Angio Chest PE W and/or Wo Contrast Result Date: 11/29/2024 CLINICAL DATA:  Pulmonary embolism EXAM: CT ANGIOGRAPHY CHEST WITH CONTRAST TECHNIQUE: Multidetector CT imaging of the chest was performed using the standard protocol during bolus administration of intravenous contrast. Multiplanar CT image reconstructions and MIPs were obtained to evaluate the vascular anatomy. RADIATION DOSE REDUCTION: This exam was performed according to the departmental dose-optimization program which includes automated exposure control, adjustment of the mA and/or kV according to patient size and/or use of iterative reconstruction technique. CONTRAST:  60mL OMNIPAQUE  IOHEXOL  350 MG/ML SOLN COMPARISON:  March 06, 2024 FINDINGS: Cardiovascular: Large pulmonary embolus is noted in left pulmonary artery which extends into upper and lower lobe branches. Smaller pulmonary embolus is noted in lower lobe branches of right pulmonary artery.  RV/LV ratio of 0.9 is noted which is within normal limits. Coronary artery calcifications are noted. Aortic calcifications are noted. Mediastinum/Nodes: No enlarged mediastinal, hilar, or axillary lymph nodes. Thyroid gland, trachea, and esophagus demonstrate no significant findings. Lungs/Pleura: Emphysematous disease is noted. No pneumothorax or pleural effusion. Upper Abdomen: No acute abnormality. Musculoskeletal: No chest wall abnormality. No acute or significant osseous findings. Review of the MIP images confirms the above findings. IMPRESSION: 1. Large pulmonary embolus is noted in left pulmonary artery which extends into upper and lower lobe branches. Smaller pulmonary embolus is noted in lower lobe branches of right pulmonary artery. Critical  Value/emergent results were called by telephone at the time of interpretation on 11/29/2024 at 11:17 am to provider [facility] , who verbally acknowledged these results. 2. Coronary artery calcifications are noted suggesting coronary artery disease. Aortic Atherosclerosis (ICD10-I70.0) and Emphysema (ICD10-J43.9). Electronically Signed   By: Lynwood Landy Raddle M.D.   On: 11/29/2024 11:17   DG Chest 2 View Result Date: 11/29/2024 CLINICAL DATA:  Shortness of breath. EXAM: CHEST - 2 VIEW COMPARISON:  12/11/2023 FINDINGS: The cardio pericardial silhouette is enlarged. Interstitial coarsening is similar to prior. No dense focal consolidative airspace opacity. No overt airspace pulmonary edema. No pleural effusion. Lungs are hyperexpanded. Telemetry leads overlie the chest. IMPRESSION: Enlargement of the cardiopericardial silhouette with chronic interstitial prominence. Electronically Signed   By: Camellia Candle M.D.   On: 11/29/2024 08:18   Scheduled Meds:  amLODipine   5 mg Oral Daily   arformoterol   15 mcg Nebulization BID   budesonide  (PULMICORT ) nebulizer solution  0.25 mg Nebulization BID   Chlorhexidine  Gluconate Cloth  6 each Topical Daily   ipratropium-albuterol   3 mL Nebulization Q4H   mouth rinse  15 mL Mouth Rinse 4 times per day   potassium chloride   40 mEq Oral Once   revefenacin   175 mcg Nebulization Daily   sodium chloride  HYPERTONIC  4 mL Nebulization Q4H   Continuous Infusions:  dextrose  5 % and 0.9 % NaCl with KCl 40 mEq/L 75 mL/hr at 11/30/24 1855   heparin  1,100 Units/hr (11/30/24 1500)    LOS: 1 day   Alejandro Marker, DO [facility] Available via Epic secure chat 7am-7pm After these hours, please refer to coverage provider listed on [email or web address] 11/30/2024, 8:24 PM

## 2024-11-30 NOTE — Consult Note (Signed)
 Chief Complaint: Patient was seen in consultation today for pulmonary embolism.  Referring Physician(s): Sherrill Cable, DO/Alghanim, Paula, MD  Supervising Physician: Jennefer Rover  Patient Status: [facility] - In-pt  History of Present Illness: Dean [name]. is a 78 y.o. male with a past medical history significant for DDD, GERD, BPH, OSA, COPD, CKD III, renal artery stenosis, CAD s/p stenting, PAD, AAA, HTN, HLD who presented to the ED yesterday with complaints of weakness and dyspnea x 2 days. Initial evaluation in the ED significant for troponin 121, proBNP 9331, K+ 2.9, NA+ 126, creatinine 1.74, (-) covid and flu. He was significantly hypertensive (223/192 mmHg) but otherwise vital signs were unremarkable, saturating at 96% on room air. CTA chest was obtained which showed:  1. Large pulmonary embolus is noted in left pulmonary artery which extends into upper and lower lobe branches. Smaller pulmonary embolus is noted in lower lobe branches of right pulmonary artery. Critical Value/emergent results were called by telephone at the time of interpretation on 11/29/2024 at 11:17 am to provider [facility] , who verbally acknowledged these results. 2. Coronary artery calcifications are noted suggesting coronary artery disease.  He was admitted for further evaluation and started on heparin  gtt. Bilateral lower extremity duplex was performed which showed acute DVT in the right posterior tibial, peroneal and femoral veins as well as acute DVT in the left femoral, posterior tibial and peroneal veins. Echo performed with results pending.   This morning he has had an acute change in respiratory status requiring increasing supplemental oxygen  and repeat CXR shows increasing left basilar opacity suspicious for infarction vs atelectasis. IR has been consulted for possible thrombectomy.   Patient seen in the ICU, he continues to have dyspnea - feels a little better with higher oxygen  and  sitting up in bed. Has a hard time talking due to dyspnea, able to speak 1-2 words at a time and voice is soft. Ongoing productive cough which causes mild chest pain briefly, but overall no chest pain currently. Continues to feel weak.   Past Medical History:  Diagnosis Date   AAA (abdominal aortic aneurysm) 2021   followed by dr valerie;   incidental finding infrarenal aaa 3.1 x 2.8 per abd/pelvis ct 02-26-20 epic;   last CT  03-10-2022  3.4cm   Arthritis    hips, back, neck    Benign localized prostatic hyperplasia with lower urinary tract symptoms (LUTS)    urologist--- dr nieves   CAD (coronary artery disease) 1994   cardiologist---  dr mona;   s/p angioplasty and stenting x3 1994;   nuclear stress test 10-15-2020  low risk, no ischemia, nuclear ef 59%   Chronic stable angina 2005   CKD (chronic kidney disease), stage III (HCC)    Complication of anesthesia    awareness during anesthesia;  w/ cholestectomy in 1990s pt stated thinks he stopped breathing   COPD mixed type (HCC)    pulmolonogist--- dr c. young   DDD (degenerative disc disease), cervical    Diverticulosis of colon    Dyspnea    chronic   Edema of both lower extremities    GERD (gastroesophageal reflux disease)    hx of   Hiatal hernia    History of adenomatous polyp of colon    History of kidney stones    History of lower GI bleeding 02/2022   admission in epic;   diverticular bleed , no transfusions   History of melanoma    per pt excision forehead ,  localized   History of prediabetes    History of tuberculosis 1966   Hyperlipidemia    Hypertension    Neuromuscular disorder (HCC)    nerve issues with back    OSA on CPAP 2000   followed by dr c. young;   sleep study in epic , severe osa;   wears cpap auto set 5 to 20   PAD (peripheral artery disease)    vascular--- dr eliza;   hx bilateral CIA stenting in 1990s;  left occluded, dr gerlean s/p  right to left fem-fem bypass 09/ 2012,  renal artery stenosis  and aaa   Parotid mass    followed by dr jesus  (ENT);  per pt stable   Peripheral vascular disease    PVC's (premature ventricular contractions)    Renal artery stenosis    02-14-2020  renal angiogram left > right renal stenosis   S/P primary angioplasty with coronary stent 1994    Past Surgical History:  Procedure Laterality Date   APPENDECTOMY  1974   CATARACT EXTRACTION W/ INTRAOCULAR LENS IMPLANT Bilateral 2019   CHOLECYSTECTOMY, LAPAROSCOPIC     1990s   COLONOSCOPY WITH PROPOFOL   04/27/2022   dr abran   CORONARY ANGIOPLASTY WITH STENT PLACEMENT  1994   CYSTOSCOPY WITH INSERTION OF UROLIFT N/A 04/14/2020   Procedure: CYSTOSCOPY WITH INSERTION OF UROLIFT;  Surgeon: Nieves Cough, MD;  Location: [facility];  Service: Urology;  Laterality: N/A;   CYSTOSCOPY WITH STENT PLACEMENT Right 07/07/2020   Procedure: CYSTOSCOPY WITH STENT CHANGE;  Surgeon: Nieves Cough, MD;  Location: [facility];  Service: Urology;  Laterality: Right;   CYSTOSCOPY WITH URETEROSCOPY AND STENT PLACEMENT Bilateral 01/03/2020   Procedure: CYSTOSCOPY WITH RETROGRADE/ URETEROSCOPY AND RIGHT STENT PLACEMENT BLADDER BIOPSY;  Surgeon: Nieves Cough, MD;  Location: [facility];  Service: Urology;  Laterality: Bilateral;  ONLY NEEDS 30 MIN   CYSTOSCOPY WITH URETEROSCOPY AND STENT PLACEMENT Right 04/14/2020   Procedure: CYSTOSCOPY WITH URETEROSCOPY, RETROGRADE  AND STENT REPLACEMENT;  Surgeon: Nieves Cough, MD;  Location: [facility];  Service: Urology;  Laterality: Right;   CYSTOSCOPY/URETEROSCOPY/HOLMIUM LASER/STENT PLACEMENT  05/18/2005   @[facility]  by dr andra OCTAVE BYPASS GRAFT  08/31/2011   @[facility]  by dr gerlean;  right to left   ILIAC ARTERY STENT Bilateral    1990s;   bilateral common iliac artery stenting   LAPAROSCOPY ABDOMEN DIAGNOSTIC  12/03/2008   @[facility]  by dr ebbie;   Lap. lysis adhesions/  open umbilical hernia repair    LUMBAR LAMINECTOMY  09/07/2004   @[facility]  by dr pool   RENAL ANGIOGRAPHY N/A 02/14/2020   Procedure: RENAL ANGIOGRAPHY;  Surgeon: Harvey Carlin BRAVO, MD;  Location: [facility];  Service: Cardiovascular;  Laterality: N/A;  Bilateral   ROBOT ASSISTED PYELOPLASTY Right 11/20/2020   Procedure: XI ROBOTIC RIGHT ASSISTED PYELOPLASTY AND STENT PLACEMENT;  Surgeon: Cam Morene ORN, MD;  Location: [facility];  Service: Urology;  Laterality: Right;   TONSILLECTOMY     child   TOTAL HIP ARTHROPLASTY Right 07/15/2020   Procedure: TOTAL HIP ARTHROPLASTY ANTERIOR APPROACH;  Surgeon: Melodi Lerner, MD;  Location: [facility];  Service: Orthopedics;  Laterality: Right;    UMBILICAL HERNIA REPAIR  2004    Allergies: Penicillins, Fish oil, Other, Penicillin g, and Mold extract [trichophyton]  Medications: Prior to Admission medications  Medication Sig Start Date End Date Taking? Authorizing Provider  albuterol  (VENTOLIN  HFA) 108 (90 Base) MCG/ACT inhaler INHALE  1 PUFF BY MOUTH INTO THE LUNGS TWICE DAILY AS NEEDED FOR WHEEZING OR SHORTNESS OF BREATH 08/08/24  Yes Young, Reggy D, MD  antiseptic oral rinse (BIOTENE) LIQD 15 mLs by Mouth Rinse route daily as needed for dry mouth.    Yes [provider]  aspirin  EC 81 MG tablet Take 81 mg by mouth daily. Swallow whole.   Yes [provider]  Azelastine -Fluticasone  137-50 MCG/ACT SUSP Place 1-2 sprays into both nostrils at bedtime. Patient not taking: Reported on 11/30/2024 05/10/23   Neysa Reggy D, MD  Cholecalciferol (VITAMIN D-3 PO) Take 1 capsule by mouth every other day.    [provider]  diltiazem  (DILT-XR) 120 MG 24 hr capsule Take 1 capsule (120 mg total) by mouth daily. Patient not taking: Reported on 11/30/2024 04/13/23   Mona Vinie BROCKS, MD  docusate sodium  (COLACE) 100 MG capsule Take 100 mg by mouth daily.    [provider]  esomeprazole (NEXIUM) 40 MG capsule Take 40 mg by mouth daily as needed  (acid reflux/indigestion.). 02/07/19   [provider]  finasteride  (PROSCAR ) 5 MG tablet Take 1 tablet by mouth at bedtime.    [provider]  furosemide  (LASIX ) 20 MG tablet TAKE 1 TABLET(20 MG) BY MOUTH DAILY AS NEEDED 05/28/24   Hilty, Vinie BROCKS, MD  irbesartan  (AVAPRO ) 150 MG tablet Take 150 mg by mouth every evening. 11/11/19   [provider]  isosorbide  mononitrate (IMDUR ) 60 MG 24 hr tablet Take 60 mg by mouth every evening.    [provider]  loperamide  (IMODIUM  A-D) 2 MG tablet Take 2 mg by mouth as needed.    [provider]  MAGNESIUM -OXIDE 400 (240 Mg) MG tablet Take 400 mg by mouth every other day.    [provider]  Menthol , Topical Analgesic, (BLUE-EMU MAXIMUM STRENGTH EX) Apply 1 application topically 3 (three) times daily as needed (pain.).    [provider]  Polyethyl Glycol-Propyl Glycol (SYSTANE ULTRA OP) Place 1 drop into both eyes daily as needed (Dry eye).    [provider]  potassium chloride  (KLOR-CON  M) 10 MEQ tablet Take 1 tablet on the days Lasix  is taken. 04/13/23   Hilty, Vinie BROCKS, MD  rosuvastatin (CRESTOR) 10 MG tablet Take 10 mg by mouth daily.    [provider]  simethicone  (MYLICON) 125 MG chewable tablet Chew 125 mg by mouth every 6 (six) hours as needed for flatulence. Takes gas x    [provider]  Tiotropium Bromide-Olodaterol (STIOLTO RESPIMAT ) 2.5-2.5 MCG/ACT AERS Inhale 2 puffs into the lungs daily. 06/04/24   Neysa Reggy BIRCH, MD  traMADol  (ULTRAM ) 50 MG tablet Take 1 tablet by mouth 4 (four) times daily as needed. 03/21/22   [provider]     Family History  Problem Relation Age of Onset   Other Father        ETOH   Diabetes Father    Hypertension Father    Kidney cancer Mother    Cancer Mother        renal   Hyperlipidemia Mother    Hypertension Mother    Cancer Sister        breast   Hyperlipidemia Sister    Hypertension Sister    Breast  cancer Sister    Hyperlipidemia Brother    Hypertension Brother    Prostate cancer Brother    Colon cancer Neg Hx    Colon polyps Neg Hx    Esophageal cancer Neg  Hx    Rectal cancer Neg Hx    Stomach cancer Neg Hx     Social History   Socioeconomic History   Marital status: Single    Spouse name: Not on file   Number of children: Not on file   Years of education: Not on file   Highest education level: Not on file  Occupational History   Occupation: retired runner, 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  Tobacco Use   Smoking status: Every Day    Current packs/day: 1.50    Average packs/day: 1.5 packs/day for 51.0 years (76.5 ttl pk-yrs)    Types: Cigarettes    Passive exposure: Never   Smokeless tobacco: Never  Vaping Use   Vaping status: Never Used  Substance and Sexual Activity   Alcohol  use: Not Currently    Alcohol /week: 0.0 standard drinks of alcohol    Drug use: Never   Sexual activity: Not on file  Other Topics Concern   Not on file  Social History Narrative   Travelled to [place] and [place] 2008, [place] 2009   Social Drivers of Health   Tobacco Use: High Risk (11/29/2024)   Patient History    Smoking Tobacco Use: Every Day    Smokeless Tobacco Use: Never    Passive Exposure: Never  Financial Resource Strain: Not on file  Food Insecurity: No Food Insecurity (11/29/2024)   Epic    Worried About Programme Researcher, Broadcasting/film/video in the Last Year: Never true    Ran Out of Food in the Last Year: Never true  Transportation Needs: Unmet Transportation Needs (11/29/2024)   Epic    Lack of Transportation (Medical): Yes    Lack of Transportation (Non-Medical): Yes  Physical Activity: Not on file  Stress: Not on file  Social Connections: Socially Isolated (11/29/2024)   Social Connection and Isolation Panel    Frequency of Communication with Friends and Family: Once a week    Frequency of Social Gatherings with Friends and Family: Once a week    Attends Religious Services: Never     Database Administrator or Organizations: No    Attends Banker Meetings: Never    Marital Status: Divorced  Depression (PHQ2-9): Not on file  Alcohol  Screen: Not on file  Housing: Low Risk (11/29/2024)   Epic    Unable to Pay for Housing in the Last Year: No    Number of Times Moved in the Last Year: 0    Homeless in the Last Year: No  Utilities: Not At Risk (11/29/2024)   Epic    Threatened with loss of utilities: No  Health Literacy: Not on file     Review of Systems: A 12 point ROS discussed and pertinent positives are indicated in the HPI above.  All other systems are negative.  Review of Systems  Constitutional:  Positive for fatigue. Negative for fever.  Respiratory:  Positive for cough and shortness of breath.   Cardiovascular:  Positive for leg swelling. Negative for chest pain.       (+) upper extremity swelling  Gastrointestinal:  Negative for abdominal pain, diarrhea, nausea and vomiting.    Vital Signs: BP (!) 173/60   Pulse (!) 58   Temp 97.9 F (36.6 C) (Oral)   Resp 18   Ht 5' 5 (1.651 m)   Wt 148 lb 9.4 oz (67.4 kg)   SpO2 93%   BMI 24.73 kg/m   Physical Exam Vitals and nursing note reviewed.  HENT:  Head: Normocephalic.  Cardiovascular:     Rate and Rhythm: Normal rate and regular rhythm.  Pulmonary:     Effort: No respiratory distress (no accessory muscle usage/shallow breathing, RR WNL).     Breath sounds: Rhonchi present.     Comments: (+) HHFNC @ 60L/min Diminished breath sounds bilaterally (+) cough productive of yellow sputum during exam Abdominal:     Palpations: Abdomen is soft.  Musculoskeletal:     Right lower leg: Edema present.     Left lower leg: Edema present.     Comments: (+) 1+ non pitting BUE edema  Skin:    General: Skin is warm and dry.     Findings: Bruising (multiple bruises BLE and BUE) present.     Comments: (-) cyanosis of lips  Neurological:     Mental Status: He is oriented to person, place,  and time.  Psychiatric:        Mood and Affect: Mood normal.        Behavior: Behavior normal.        Thought Content: Thought content normal.        Judgment: Judgment normal.      MD Evaluation Airway: Other (comments) Airway comments: HHFNC @ 60L/min Heart: WNL Abdomen: WNL Chest/ Lungs: Other (comments) Chest/ lungs comments: (+) Rhonchi, dyspnea ASA  Classification: 3 Mallampati/Airway Score: Two   Imaging: VAS US  LOWER EXTREMITY VENOUS (DVT) Result Date: 11/30/2024  Lower Venous DVT Study Patient Name:  Dean [name].  Date of Exam:   11/29/2024 Medical Rec #: [id number]              Accession #:    [id number] Date of Birth: 1946-11-15              Patient Gender: M Patient Age:   49 years Exam Location:  [facility] Procedure:      VAS US  LOWER EXTREMITY VENOUS (DVT) Referring Phys: DAVID ORTIZ --------------------------------------------------------------------------------  Indications: Pulmonary embolism.  Comparison Study: Prior negative bilateral LEV done 01/14/21 Performing Technologist: Alberta Lis RVS  Examination Guidelines: A complete evaluation includes B-mode imaging, spectral Doppler, color Doppler, and power Doppler as needed of all accessible portions of each vessel. Bilateral testing is considered an integral part of a complete examination. Limited examinations for reoccurring indications may be performed as noted. The reflux portion of the exam is performed with the patient in reverse Trendelenburg.  +---------+---------------+---------+-----------+----------+--------------+ RIGHT    CompressibilityPhasicitySpontaneityPropertiesThrombus Aging +---------+---------------+---------+-----------+----------+--------------+ CFV      Full           Yes      Yes                                 +---------+---------------+---------+-----------+----------+--------------+ SFJ      Full                                                         +---------+---------------+---------+-----------+----------+--------------+ FV Prox  Full           Yes      No                                  +---------+---------------+---------+-----------+----------+--------------+ FV  Mid   Full                                                        +---------+---------------+---------+-----------+----------+--------------+ FV DistalFull           Yes      Yes                                 +---------+---------------+---------+-----------+----------+--------------+ PFV      Full           Yes      Yes                                 +---------+---------------+---------+-----------+----------+--------------+ POP                     Yes      Yes                                 +---------+---------------+---------+-----------+----------+--------------+ PTV      None                                         Acute          +---------+---------------+---------+-----------+----------+--------------+ PERO     None                                         Acute          +---------+---------------+---------+-----------+----------+--------------+   +---------+---------------+---------+-----------+----------+-------------------+ LEFT     CompressibilityPhasicitySpontaneityPropertiesThrombus Aging      +---------+---------------+---------+-----------+----------+-------------------+ CFV      Full           Yes      Yes                                      +---------+---------------+---------+-----------+----------+-------------------+ SFJ      Full                                                             +---------+---------------+---------+-----------+----------+-------------------+ FV Prox  Partial        Yes      No                   acute, poorly                                                             adhered              +---------+---------------+---------+-----------+----------+-------------------+  FV Mid   Partial                                      acute, poorly                                                             adhered             +---------+---------------+---------+-----------+----------+-------------------+ FV DistalFull           Yes      No                                       +---------+---------------+---------+-----------+----------+-------------------+ PFV      Full           Yes      No                                       +---------+---------------+---------+-----------+----------+-------------------+ POP      Full           Yes      No                                       +---------+---------------+---------+-----------+----------+-------------------+ PTV      None                                         Acute               +---------+---------------+---------+-----------+----------+-------------------+ PERO     None                                         Acute               +---------+---------------+---------+-----------+----------+-------------------+ Gastroc  Full                                                             +---------+---------------+---------+-----------+----------+-------------------+ EIV                     Yes      No                   patent by color and                                                       Doppler             +---------+---------------+---------+-----------+----------+-------------------+  Summary: RIGHT: - Findings consistent with acute deep vein thrombosis involving the right posterior tibial veins, right peroneal veins, and right femoral vein.  - A cystic structure is found in the popliteal fossa.  LEFT: - Findings consistent with acute deep vein thrombosis involving the left femoral vein, left posterior tibial veins, and left peroneal veins.  - No cystic structure found in the  popliteal fossa.  *See table(s) above for measurements and observations. Electronically signed by Gaile New MD on 11/30/2024 at 10:50:28 AM.    Final    DG CHEST PORT 1 VIEW Result Date: 11/30/2024 EXAM: 1 VIEW(S) XRAY OF THE CHEST 11/30/2024 10:02:00 AM COMPARISON: 11/29/2024 CLINICAL HISTORY: 78 year old male with acute pulmonary emboli. Hypoxia FINDINGS: LUNGS AND PLEURA: Emphysema. Increasing left basilar opacity. Possible small left pleural effusion. No pneumothorax. HEART AND MEDIASTINUM: Atherosclerotic calcifications. No acute abnormality of the cardiac and mediastinal silhouettes. BONES AND SOFT TISSUES: No acute osseous abnormality. IMPRESSION: 1. Increasing left basilar opacity, suspicious for infarction in this clinical setting of acute pulmonary emboli. 2. Possible small left pleural effusion. 3. Emphysema. Electronically signed by: Helayne Hurst MD 11/30/2024 10:47 AM EST RP Workstation: [id number]   CT Angio Chest PE W and/or Wo Contrast Result Date: 11/29/2024 CLINICAL DATA:  Pulmonary embolism EXAM: CT ANGIOGRAPHY CHEST WITH CONTRAST TECHNIQUE: Multidetector CT imaging of the chest was performed using the standard protocol during bolus administration of intravenous contrast. Multiplanar CT image reconstructions and MIPs were obtained to evaluate the vascular anatomy. RADIATION DOSE REDUCTION: This exam was performed according to the departmental dose-optimization program which includes automated exposure control, adjustment of the mA and/or kV according to patient size and/or use of iterative reconstruction technique. CONTRAST:  60mL OMNIPAQUE  IOHEXOL  350 MG/ML SOLN COMPARISON:  March 06, 2024 FINDINGS: Cardiovascular: Large pulmonary embolus is noted in left pulmonary artery which extends into upper and lower lobe branches. Smaller pulmonary embolus is noted in lower lobe branches of right pulmonary artery. RV/LV ratio of 0.9 is noted which is within normal limits. Coronary artery  calcifications are noted. Aortic calcifications are noted. Mediastinum/Nodes: No enlarged mediastinal, hilar, or axillary lymph nodes. Thyroid gland, trachea, and esophagus demonstrate no significant findings. Lungs/Pleura: Emphysematous disease is noted. No pneumothorax or pleural effusion. Upper Abdomen: No acute abnormality. Musculoskeletal: No chest wall abnormality. No acute or significant osseous findings. Review of the MIP images confirms the above findings. IMPRESSION: 1. Large pulmonary embolus is noted in left pulmonary artery which extends into upper and lower lobe branches. Smaller pulmonary embolus is noted in lower lobe branches of right pulmonary artery. Critical Value/emergent results were called by telephone at the time of interpretation on 11/29/2024 at 11:17 am to provider [facility] , who verbally acknowledged these results. 2. Coronary artery calcifications are noted suggesting coronary artery disease. Aortic Atherosclerosis (ICD10-I70.0) and Emphysema (ICD10-J43.9). Electronically Signed   By: Lynwood Landy Raddle M.D.   On: 11/29/2024 11:17   DG Chest 2 View Result Date: 11/29/2024 CLINICAL DATA:  Shortness of breath. EXAM: CHEST - 2 VIEW COMPARISON:  12/11/2023 FINDINGS: The cardio pericardial silhouette is enlarged. Interstitial coarsening is similar to prior. No dense focal consolidative airspace opacity. No overt airspace pulmonary edema. No pleural effusion. Lungs are hyperexpanded. Telemetry leads overlie the chest. IMPRESSION: Enlargement of the cardiopericardial silhouette with chronic interstitial prominence. Electronically Signed   By: Camellia Candle M.D.   On: 11/29/2024 08:18    Labs:  CBC: Recent Labs    11/29/24 0701 11/30/24 0249  WBC 8.6  8.9  HGB 14.5 13.6  HCT 44.6 42.4  PLT 202 187    COAGS: No results for input(s): INR, APTT in the last 8760 hours.  BMP: Recent Labs    11/29/24 0701 11/30/24 0249  NA 146* 143  K 2.9* 2.9*  CL 101 101  CO2 29 28   GLUCOSE 72 69*  BUN 15 16  CALCIUM  8.9 8.3*  CREATININE 1.74* 1.53*  GFRNONAA 40* 47*    LIVER FUNCTION TESTS: Recent Labs    11/29/24 0701 11/30/24 0249  BILITOT 0.6 0.6  AST 30 26  ALT 7 7  ALKPHOS 118 109  PROT 7.2 6.8  ALBUMIN 3.5 3.3*    TUMOR MARKERS: No results for input(s): AFPTM, CEA, CA199, CHROMGRNA in the last 8760 hours.  Assessment and Plan:  78 y/o M who presented to the ED on 12/12 with weakness and dyspnea, ultimately found to have large left sided PE with small PE on the right without evidence of right heart strain on CT. He was later also found to have BLE acute DVTs. He was started on heparin  gtt and admitted for further monitoring. This morning he developed worsening dyspnea and significantly increased oxygen  requirement, CXR shows concern for pulmonary infarct vs atelectasis.  IR has been consulted for possible thrombectomy.   Patient seen in the ICU, continues to have dyspnea and productive cough and is requiring HHFNC @ 60L/min with spO2 93-96%. Hypertensive, somewhat bradycardic (58-62 during my exam), RR 18, no use of accessory muscles or nasal flaring.   Echo reviewed which showed normal RV size and function, mildly dilated left and right atria, EF 60-65%.   PESI = 97, intermediate to low risk  Discussed potential indications for IR thrombectomy with patient today as well as risks and alternatives - he is agreeable if necessary but is aware we are still in the process of evaluating him for candidacy. We plan to repeat CTA chest and will make decision regarding need for procedure after this is reviewed. No consent signed yet.  Will discuss CTA results with patient/primary team once completed as well as next steps in PE management.   Thank you for this interesting consult.  I greatly enjoyed meeting Bence Trapp. and look forward to participating in their care.  A copy of this report was sent to the requesting provider on this  date.  Electronically Signed: Clotilda DELENA Hesselbach, PA-C 11/30/2024, 11:08 AM   I spent a total of 55 Mi[place] in face to face in clinical consultation, greater than 50% of which was counseling/coordinating care for pulmonary embolism.

## 2024-11-30 NOTE — Progress Notes (Addendum)
 NAME:  Dean Langston., MRN:  [id number], DOB:  08-26-1946, LOS: 1 ADMISSION DATE:  11/29/2024, CONSULTATION DATE:  12/12 REFERRING MD:  Dr. Celinda, CHIEF COMPLAINT:  PE   History of Present Illness:  78 year old male with past medical history as below, which is significant for coronary artery disease, stage III CKD, COPD, OSA on CPAP, renal artery stenosis, AAA, and GI bleed.  Who presented to [facility] emergency department 12/12 with complaints of generalized weakness and dyspnea.  Evaluation in the emergency department included chest x-ray which showed cardiomegaly and chronic interstitial prominence.  CT angiogram of the chest demonstrated large left-sided pulmonary embolism and smaller pulmonary embolism on the right.  RV LV ratio 0.9.  He was admitted to the hospital service on a heparin  infusion.  In the evening hours patient began to have increasing oxygen  requirement and PCCM was asked to evaluate.  Pertinent  Medical History   has a past medical history of AAA (abdominal aortic aneurysm) (2021), Arthritis, Benign localized prostatic hyperplasia with lower urinary tract symptoms (LUTS), CAD (coronary artery disease) (1994), Chronic stable angina (2005), CKD (chronic kidney disease), stage III (HCC), Complication of anesthesia, COPD mixed type (HCC), DDD (degenerative disc disease), cervical, Diverticulosis of colon, Dyspnea, Edema of both lower extremities, GERD (gastroesophageal reflux disease), Hiatal hernia, History of adenomatous polyp of colon, History of kidney stones, History of lower GI bleeding (02/2022), History of melanoma, History of prediabetes, History of tuberculosis (1966), Hyperlipidemia, Hypertension, Neuromuscular disorder (HCC), OSA on CPAP (2000), PAD (peripheral artery disease), Parotid mass, Peripheral vascular disease, PVC's (premature ventricular contractions), Renal artery stenosis, and S/P primary angioplasty with coronary stent (1994).   Significant Hospital  Events: Including procedures, antibiotic start and stop dates in addition to other pertinent events   12/12 admit to [facility] for high risk PE 12/13 increased hypoxia requiring HFNC  Interim History / Subjective:  Patient complaining of being short of breath, has cough productive of thick sputum. RN also notes thick sputum production. He is anxious about increasing oxygen  requirement.  Objective    Blood pressure (!) 173/60, pulse (!) 58, temperature 97.9 F (36.6 C), temperature source Oral, resp. rate 18, height 5' 5 (1.651 m), weight 67.4 kg, SpO2 93%.    FiO2 (%):  [60 %] 60 %   Intake/Output Summary (Last 24 hours) at 11/30/2024 1106 Last data filed at 11/30/2024 0506 Gross per 24 hour  Intake 659.05 ml  Output --  Net 659.05 ml   Filed Weights   11/29/24 1314  Weight: 67.4 kg   Examination: General: awake, alert, oriented, slightly tachypneic Eyes: PERRL, no scleral icterus ENMT: oropharynx clear, good dentition, no oral lesions, mallampati score IV Skin: warm, intact, no rashes Neck: JVD 10 cm H2O CV: bradycardic, no MRG, nl S1 and S2, 1+pitting edema Resp: diminished breath sounds anteriorly with some scattered rhonchi. Neuro: Awake alert oriented to person place time and situation  Resolved problem list   Assessment and Plan   Acute Hypoxic Respiratory Failure - worsening likely due to evolving pulmonary infarction Pulmonary Embolism: significant clot burden on the left, lesser burden on the right. No RV strain on CT. Pro BNP 9331, Trop 132. No lactic sent. PESI class 5, BOVA 4. Very high risk. RV/LV ratio 0.9. POCUS shows normal RV size and function. Formal echocardiogram pending at this time. CXR this AM shows worsening infiltrates on left lung concerning for pulmonary infarct given distribution of the pulmonary emboli. - Continue heparin  drip - Trend cardiac biomarkers -  FU echo and LE duplex results - Discussed with IR attending, patient has increasing cardiac  biomarkers. Likely to need IR thrombectomy. - Supplemental O2 to keep sats > 92%, currently on HFNC and up titrating. - Lasix  20 mg IVP x 1 - Monitor electrolytes and replete as tolerated - PCCM will follow  OSA on CPAP - CPAP QHS  COPD without exacerbation - ICS/LAMA/LABA with Budesonide /Yupelri /Brovana  - Duonebs QID - Hypertonic BID - Chest PT  Labs   CBC: Recent Labs  Lab 11/29/24 0701 11/30/24 0249  WBC 8.6 8.9  NEUTROABS 6.5  --   HGB 14.5 13.6  HCT 44.6 42.4  MCV 97.2 96.1  PLT 202 187    Basic Metabolic Panel: Recent Labs  Lab 11/29/24 0701 11/29/24 0925 11/30/24 0249 11/30/24 0810  NA 146*  --  143  --   K 2.9*  --  2.9*  --   CL 101  --  101  --   CO2 29  --  28  --   GLUCOSE 72  --  69*  --   BUN 15  --  16  --   CREATININE 1.74*  --  1.53*  --   CALCIUM  8.9  --  8.3*  --   MG  --  1.8  --  2.3  PHOS  --  3.4  --  3.5   GFR: Estimated Creatinine Clearance: 35.2 mL/min (A) (by C-G formula based on SCr of 1.53 mg/dL (H)). Recent Labs  Lab 11/29/24 0701 11/30/24 0249  WBC 8.6 8.9    Liver Function Tests: Recent Labs  Lab 11/29/24 0701 11/30/24 0249  AST 30 26  ALT 7 7  ALKPHOS 118 109  BILITOT 0.6 0.6  PROT 7.2 6.8  ALBUMIN 3.5 3.3*   No results for input(s): LIPASE, AMYLASE in the last 168 hours. No results for input(s): AMMONIA in the last 168 hours.  ABG    Component Value Date/Time   TCO2 26 01/03/2023 0653     Coagulation Profile: No results for input(s): INR, PROTIME in the last 168 hours.  Cardiac Enzymes: No results for input(s): CKTOTAL, CKMB, CKMBINDEX, TROPONINI in the last 168 hours.  HbA1C: Hgb A1c MFr Bld  Date/Time Value Ref Range Status  11/05/2019 10:49 AM 6.6 (H) 4.8 - 5.6 % Final    Comment:    (NOTE) Pre diabetes:          5.7%-6.4% Diabetes:              >6.4% Glycemic control for   <7.0% adults with diabetes     CBG: Recent Labs  Lab 11/29/24 1646 11/29/24 1953  11/30/24 0025 11/30/24 0515 11/30/24 0555  GLUCAP 132* 85 82 59* 177*    Review of Systems:   Not obtained  Past Medical History:  He,  has a past medical history of AAA (abdominal aortic aneurysm) (2021), Arthritis, Benign localized prostatic hyperplasia with lower urinary tract symptoms (LUTS), CAD (coronary artery disease) (1994), Chronic stable angina (2005), CKD (chronic kidney disease), stage III (HCC), Complication of anesthesia, COPD mixed type (HCC), DDD (degenerative disc disease), cervical, Diverticulosis of colon, Dyspnea, Edema of both lower extremities, GERD (gastroesophageal reflux disease), Hiatal hernia, History of adenomatous polyp of colon, History of kidney stones, History of lower GI bleeding (02/2022), History of melanoma, History of prediabetes, History of tuberculosis (1966), Hyperlipidemia, Hypertension, Neuromuscular disorder (HCC), OSA on CPAP (2000), PAD (peripheral artery disease), Parotid mass, Peripheral vascular disease, PVC's (premature ventricular contractions), Renal artery  stenosis, and S/P primary angioplasty with coronary stent (1994).   Surgical History:   Past Surgical History:  Procedure Laterality Date   APPENDECTOMY  1974   CATARACT EXTRACTION W/ INTRAOCULAR LENS IMPLANT Bilateral 2019   CHOLECYSTECTOMY, LAPAROSCOPIC     1990s   COLONOSCOPY WITH PROPOFOL   04/27/2022   dr abran   CORONARY ANGIOPLASTY WITH STENT PLACEMENT  1994   CYSTOSCOPY WITH INSERTION OF UROLIFT N/A 04/14/2020   Procedure: CYSTOSCOPY WITH INSERTION OF UROLIFT;  Surgeon: Nieves Cough, MD;  Location: [facility];  Service: Urology;  Laterality: N/A;   CYSTOSCOPY WITH STENT PLACEMENT Right 07/07/2020   Procedure: CYSTOSCOPY WITH STENT CHANGE;  Surgeon: Nieves Cough, MD;  Location: [facility];  Service: Urology;  Laterality: Right;   CYSTOSCOPY WITH URETEROSCOPY AND STENT PLACEMENT Bilateral 01/03/2020   Procedure: CYSTOSCOPY WITH  RETROGRADE/ URETEROSCOPY AND RIGHT STENT PLACEMENT BLADDER BIOPSY;  Surgeon: Nieves Cough, MD;  Location: [facility];  Service: Urology;  Laterality: Bilateral;  ONLY NEEDS 30 MIN   CYSTOSCOPY WITH URETEROSCOPY AND STENT PLACEMENT Right 04/14/2020   Procedure: CYSTOSCOPY WITH URETEROSCOPY, RETROGRADE  AND STENT REPLACEMENT;  Surgeon: Nieves Cough, MD;  Location: [facility];  Service: Urology;  Laterality: Right;   CYSTOSCOPY/URETEROSCOPY/HOLMIUM LASER/STENT PLACEMENT  05/18/2005   @[facility]  by dr andra OCTAVE BYPASS GRAFT  08/31/2011   @[facility]  by dr gerlean;  right to left   ILIAC ARTERY STENT Bilateral    1990s;   bilateral common iliac artery stenting   LAPAROSCOPY ABDOMEN DIAGNOSTIC  12/03/2008   @[facility]  by dr ebbie;   Lap. lysis adhesions/  open umbilical hernia repair   LUMBAR LAMINECTOMY  09/07/2004   @[facility]  by dr pool   RENAL ANGIOGRAPHY N/A 02/14/2020   Procedure: RENAL ANGIOGRAPHY;  Surgeon: Harvey Carlin BRAVO, MD;  Location: [facility] INVASIVE CV LAB;  Service: Cardiovascular;  Laterality: N/A;  Bilateral   ROBOT ASSISTED PYELOPLASTY Right 11/20/2020   Procedure: XI ROBOTIC RIGHT ASSISTED PYELOPLASTY AND STENT PLACEMENT;  Surgeon: Cam Morene ORN, MD;  Location: [facility];  Service: Urology;  Laterality: Right;   TONSILLECTOMY     child   TOTAL HIP ARTHROPLASTY Right 07/15/2020   Procedure: TOTAL HIP ARTHROPLASTY ANTERIOR APPROACH;  Surgeon: Melodi Lerner, MD;  Location: [facility];  Service: Orthopedics;  Laterality: Right;    UMBILICAL HERNIA REPAIR  2004     Social History:   reports that he has been smoking cigarettes. He has a 76.5 pack-year smoking history. He has never been exposed to tobacco smoke. He has never used smokeless tobacco. He reports that he does not currently use alcohol . He reports that he does not use drugs.   Family History:  His family history includes Breast cancer in his sister; Cancer in his mother and sister;  Diabetes in his father; Hyperlipidemia in his brother, mother, and sister; Hypertension in his brother, father, mother, and sister; Kidney cancer in his mother; Other in his father; Prostate cancer in his brother. There is no history of Colon cancer, Colon polyps, Esophageal cancer, Rectal cancer, or Stomach cancer.   Allergies Allergies[1]   Home Medications  Prior to Admission medications  Medication Sig Start Date End Date Taking? Authorizing Provider  albuterol  (VENTOLIN  HFA) 108 (90 Base) MCG/ACT inhaler INHALE 1 PUFF BY MOUTH INTO THE LUNGS TWICE DAILY AS NEEDED FOR WHEEZING OR SHORTNESS OF BREATH 08/08/24   Neysa Rama D, MD  antiseptic oral rinse (BIOTENE) LIQD 15 mLs by Mouth  Rinse route daily as needed for dry mouth.     [provider]  aspirin  EC 81 MG tablet Take 81 mg by mouth daily. Swallow whole.    [provider]  Azelastine -Fluticasone  137-50 MCG/ACT SUSP Place 1-2 sprays into both nostrils at bedtime. 05/10/23   Neysa Rama D, MD  Cholecalciferol (VITAMIN D-3 PO) Take 1 capsule by mouth every other day.    [provider]  diltiazem  (DILT-XR) 120 MG 24 hr capsule Take 1 capsule (120 mg total) by mouth daily. 04/13/23   Hilty, Vinie BROCKS, MD  docusate sodium  (COLACE) 100 MG capsule Take 100 mg by mouth daily.    [provider]  esomeprazole (NEXIUM) 40 MG capsule Take 40 mg by mouth daily as needed (acid reflux/indigestion.). 02/07/19   [provider]  finasteride  (PROSCAR ) 5 MG tablet Take 1 tablet by mouth at bedtime.    [provider]  furosemide  (LASIX ) 20 MG tablet TAKE 1 TABLET(20 MG) BY MOUTH DAILY AS NEEDED 05/28/24   Hilty, Vinie BROCKS, MD  irbesartan  (AVAPRO ) 150 MG tablet Take 150 mg by mouth every evening. 11/11/19   [provider]  isosorbide  mononitrate (IMDUR ) 60 MG 24 hr tablet Take 60 mg by mouth every evening.    [provider]  loperamide  (IMODIUM  A-D) 2 MG tablet Take 2 mg by mouth as  needed.    [provider]  MAGNESIUM -OXIDE 400 (240 Mg) MG tablet Take 400 mg by mouth every other day.    [provider]  Menthol , Topical Analgesic, (BLUE-EMU MAXIMUM STRENGTH EX) Apply 1 application topically 3 (three) times daily as needed (pain.).    [provider]  Polyethyl Glycol-Propyl Glycol (SYSTANE ULTRA OP) Place 1 drop into both eyes daily as needed (Dry eye).    [provider]  potassium chloride  (KLOR-CON  M) 10 MEQ tablet Take 1 tablet on the days Lasix  is taken. 04/13/23   Hilty, Vinie BROCKS, MD  rosuvastatin (CRESTOR) 10 MG tablet Take 10 mg by mouth daily.    [provider]  simethicone  (MYLICON) 125 MG chewable tablet Chew 125 mg by mouth every 6 (six) hours as needed for flatulence. Takes gas x    [provider]  Tiotropium Bromide-Olodaterol (STIOLTO RESPIMAT ) 2.5-2.5 MCG/ACT AERS Inhale 2 puffs into the lungs daily. 06/04/24   Neysa Rama BIRCH, MD  traMADol  (ULTRAM ) 50 MG tablet Take 1 tablet by mouth 4 (four) times daily as needed. 03/21/22   [provider]     Due to a high probability of clinically significant, life threatening deterioration, the patient required my highest level of preparedness to intervene emergently and I personally spent this critical care time directly and personally managing the patient. This critical care time included obtaining a history; examining the patient; pulse oximetry; ordering and review of studies; arranging urgent treatment with development of a management plan; evaluation of patient's response to treatment; frequent reassessment; and, discussions with other providers.  This critical care time was performed to assess and manage the high probability of imminent, life-threatening deterioration that could result in multi-organ failure. It was exclusive of separately billable procedures and treating other patients and teaching time. Critical Care Time: 30 minutes.  Paula Southerly,  MD [facility]                [1]  Allergies Allergen Reactions   Penicillins     Childhood reaction  REACTION: rapid pulse Did it involve swelling of the  face/tongue/throat, SOB, or low BP? Yes Did it involve sudden or severe rash/hives, skin peeling, or any reaction on the inside of your mouth or nose? Unknown Did you need to seek medical attention at a hospital or doctor's office? yes When did it last happen?      childhood  If all above answers are NO, may proceed with cephalosporin use.    Fish Oil     Other reaction(s): Heart attack symptoms   Other     Oily seafood salmon-- chest pain   Penicillin G     Other reaction(s): Unknown   Mold Extract [Trichophyton] Rash

## 2024-11-30 NOTE — Evaluation (Signed)
 Clinical/Bedside Swallow Evaluation Patient Details  Name: Dean [name]. MRN: [id number] Date of Birth: 29-Apr-1946  Today's Date: 11/30/2024 Time: SLP Start Time (ACUTE ONLY): 9161 SLP Stop Time (ACUTE ONLY): 0848 SLP Time Calculation (min) (ACUTE ONLY): 10 min  Past Medical History:  Past Medical History:  Diagnosis Date   AAA (abdominal aortic aneurysm) 2021   followed by dr valerie;   incidental finding infrarenal aaa 3.1 x 2.8 per abd/pelvis ct 02-26-20 epic;   last CT  03-10-2022  3.4cm   Arthritis    hips, back, neck    Benign localized prostatic hyperplasia with lower urinary tract symptoms (LUTS)    urologist--- dr nieves   CAD (coronary artery disease) 1994   cardiologist---  dr mona;   s/p angioplasty and stenting x3 1994;   nuclear stress test 10-15-2020  low risk, no ischemia, nuclear ef 59%   Chronic stable angina 2005   CKD (chronic kidney disease), stage III (HCC)    Complication of anesthesia    awareness during anesthesia;  w/ cholestectomy in 1990s pt stated thinks he stopped breathing   COPD mixed type (HCC)    pulmolonogist--- dr c. young   DDD (degenerative disc disease), cervical    Diverticulosis of colon    Dyspnea    chronic   Edema of both lower extremities    GERD (gastroesophageal reflux disease)    hx of   Hiatal hernia    History of adenomatous polyp of colon    History of kidney stones    History of lower GI bleeding 02/2022   admission in epic;   diverticular bleed , no transfusions   History of melanoma    per pt excision forehead , localized   History of prediabetes    History of tuberculosis 1966   Hyperlipidemia    Hypertension    Neuromuscular disorder (HCC)    nerve issues with back    OSA on CPAP 2000   followed by dr c. young;   sleep study in epic , severe osa;   wears cpap auto set 5 to 20   PAD (peripheral artery disease)    vascular--- dr eliza;   hx bilateral CIA stenting in 1990s;  left occluded, dr gerlean s/p   right to left fem-fem bypass 09/ 2012,  renal artery stenosis and aaa   Parotid mass    followed by dr jesus  (ENT);  per pt stable   Peripheral vascular disease    PVC's (premature ventricular contractions)    Renal artery stenosis    02-14-2020  renal angiogram left > right renal stenosis   S/P primary angioplasty with coronary stent 1994   Past Surgical History:  Past Surgical History:  Procedure Laterality Date   APPENDECTOMY  1974   CATARACT EXTRACTION W/ INTRAOCULAR LENS IMPLANT Bilateral 2019   CHOLECYSTECTOMY, LAPAROSCOPIC     1990s   COLONOSCOPY WITH PROPOFOL   04/27/2022   dr abran   CORONARY ANGIOPLASTY WITH STENT PLACEMENT  1994   CYSTOSCOPY WITH INSERTION OF UROLIFT N/A 04/14/2020   Procedure: CYSTOSCOPY WITH INSERTION OF UROLIFT;  Surgeon: Nieves Cough, MD;  Location: [facility];  Service: Urology;  Laterality: N/A;   CYSTOSCOPY WITH STENT PLACEMENT Right 07/07/2020   Procedure: CYSTOSCOPY WITH STENT CHANGE;  Surgeon: Nieves Cough, MD;  Location: [facility];  Service: Urology;  Laterality: Right;   CYSTOSCOPY WITH URETEROSCOPY AND STENT PLACEMENT Bilateral 01/03/2020   Procedure: CYSTOSCOPY WITH RETROGRADE/ URETEROSCOPY AND RIGHT  STENT PLACEMENT BLADDER BIOPSY;  Surgeon: Nieves Cough, MD;  Location: [facility];  Service: Urology;  Laterality: Bilateral;  ONLY NEEDS 30 MIN   CYSTOSCOPY WITH URETEROSCOPY AND STENT PLACEMENT Right 04/14/2020   Procedure: CYSTOSCOPY WITH URETEROSCOPY, RETROGRADE  AND STENT REPLACEMENT;  Surgeon: Nieves Cough, MD;  Location: [facility];  Service: Urology;  Laterality: Right;   CYSTOSCOPY/URETEROSCOPY/HOLMIUM LASER/STENT PLACEMENT  05/18/2005   @[facility]  by dr andra OCTAVE BYPASS GRAFT  08/31/2011   @[facility]  by dr gerlean;  right to left   ILIAC ARTERY STENT Bilateral    1990s;   bilateral common iliac artery stenting   LAPAROSCOPY ABDOMEN DIAGNOSTIC   12/03/2008   @[facility]  by dr ebbie;   Lap. lysis adhesions/  open umbilical hernia repair   LUMBAR LAMINECTOMY  09/07/2004   @[facility]  by dr pool   RENAL ANGIOGRAPHY N/A 02/14/2020   Procedure: RENAL ANGIOGRAPHY;  Surgeon: Harvey Carlin BRAVO, MD;  [facility] INVASIVE CV LAB;  Service: Cardiovascular;  Laterality: N/A;  Bilateral   ROBOT ASSISTED PYELOPLASTY Right 11/20/2020   Procedure: XI ROBOTIC RIGHT ASSISTED PYELOPLASTY AND STENT PLACEMENT;  Surgeon: Cam Morene ORN, MD;  Location: [facility];  Service: Urology;  Laterality: Right;   TONSILLECTOMY     child   TOTAL HIP ARTHROPLASTY Right 07/15/2020   Procedure: TOTAL HIP ARTHROPLASTY ANTERIOR APPROACH;  Surgeon: Melodi Lerner, MD;  Location: [facility];  Service: Orthopedics;  Laterality: Right;    UMBILICAL HERNIA REPAIR  2004   HPI:  78 y.o. patient who presented to the hospital on 11/29/24 with generalized weakness and dyspnea. CXR showed Increasing left basilar opacity, suspicious for infarction in this clinical setting of acute pulmonary emboli; CTA chest showed CTA chest with a large pulmonary embolus in the left pulmonary artery which extends into upper and lower lobe branches. He was admitted with PE. Due to concerns for dysphagia, patient made NPO awaiting SLP swallow evaluation. He has been satting in the low 90% on HFNC at 10L. PMH: CAd, CKD, COPD, DDD, dyspnea, GERD, hiatal hernia, OSA on CPAP, HTN, HLD, PAD, parotid mass (followed by ENT).    Assessment / Plan / Recommendation  Clinical Impression  SLP recommending continue NPO status, necessary oral meds crushed or whole in puree, and will proceed with MBS prior to any other PO recommendations. Patient presents with clinical s/s of dysphagia as per this bedside swallow evaluation. At rest, oxygen  saturations in low 90% and RR in low 20's. He exhibited prolonged cough in absence of PO's and this was productive of small amount of liquid, yellowish secretions. SLP assessed patient's  swallow via thin liquids (water ). Delayed cough as well as increased RR (to 30) following initial trial of water  sips. During subsequent trial, he exhibited a wet sounding cough which occured almost immediately after finishing consecutive straw sips of water .  SLP Visit Diagnosis: Dysphagia, unspecified (R13.10)    Aspiration Risk  Moderate aspiration risk    Diet Recommendation NPO;NPO except meds    Medication Administration: Other (Comment) (necessary meds whole or crushed in puree) Compensations: Slow rate;Small sips/bites    Other Recommendations Oral Care Recommendations: Oral care BID;Staff/trained caregiver to provide oral care     Swallow Evaluation Recommendations     Assistance Recommended at Discharge    Functional Status Assessment Patient has had a recent decline in their functional status and demonstrates the ability to make significant improvements in function in a reasonable and predictable amount of time.  Frequency  and Duration min 2x/week  1 week       Prognosis Prognosis for improved oropharyngeal function: Good      Swallow Study   General Date of Onset: 11/29/24 HPI: 78 y.o. patient who presented to the hospital on 11/29/24 with generalized weakness and dyspnea. CXR showed Increasing left basilar opacity, suspicious for infarction in this clinical setting of acute pulmonary emboli; CTA chest showed CTA chest with a large pulmonary embolus in the left pulmonary artery which extends into upper and lower lobe branches. He was admitted with PE. Due to concerns for dysphagia, patient made NPO awaiting SLP swallow evaluation. He has been satting in the low 90% on HFNC at 10L. PMH: CAd, CKD, COPD, DDD, dyspnea, GERD, hiatal hernia, OSA on CPAP, HTN, HLD, PAD, parotid mass (followed by ENT). Type of Study: Bedside Swallow Evaluation Previous Swallow Assessment: none found Diet Prior to this Study: NPO Temperature Spikes Noted: No Respiratory Status: Nasal  cannula History of Recent Intubation: No Behavior/Cognition: Alert;Cooperative;Pleasant mood Oral Cavity Assessment: Dry;Excessive secretions Oral Care Completed by SLP: Yes Oral Cavity - Dentition: Adequate natural dentition Self-Feeding Abilities: Needs set up;Needs assist Patient Positioning: Upright in bed Baseline Vocal Quality: Low vocal intensity Volitional Cough: Strong;Congested Volitional Swallow: Able to elicit    Oral/Motor/Sensory Function Overall Oral Motor/Sensory Function: Within functional limits   Ice Chips     Thin Liquid Thin Liquid: Impaired Presentation: Straw;Self Fed Pharyngeal  Phase Impairments: Cough - Delayed;Cough - Immediate    Nectar Thick Nectar Thick Liquid: Not tested   Honey Thick Honey Thick Liquid: Not tested   Puree Puree: Not tested   Solid     Solid: Not tested      Dean IVAR Blase, MA, CCC-SLP Speech Therapy  11/30/2024,11:36 AM

## 2024-11-30 NOTE — Progress Notes (Signed)
 PHARMACY - ANTICOAGULATION CONSULT NOTE  Pharmacy Consult for IV heparin  Indication: pulmonary embolus  Allergies[1]  Patient Measurements: Height: 5' 5 (165.1 cm) Weight: 67.4 kg (148 lb 9.4 oz) IBW/kg (Calculated) : 61.5 HEPARIN  DW (KG): 67.4  Vital Signs: Temp: 97.9 F (36.6 C) (12/13 0805) Temp Source: Oral (12/13 0805) BP: 173/60 (12/13 0619) Pulse Rate: 66 (12/13 0924)  Labs: Recent Labs    11/29/24 0701 11/29/24 2239 11/30/24 0249 11/30/24 0802  HGB 14.5  --  13.6  --   HCT 44.6  --  42.4  --   PLT 202  --  187  --   HEPARINUNFRC  --  0.66  --  0.61  CREATININE 1.74*  --  1.53*  --     Estimated Creatinine Clearance: 35.2 mL/min (A) (by C-G formula based on SCr of 1.53 mg/dL (H)).   Medical History: Past Medical History:  Diagnosis Date   AAA (abdominal aortic aneurysm) 2021   followed by dr valerie;   incidental finding infrarenal aaa 3.1 x 2.8 per abd/pelvis ct 02-26-20 epic;   last CT  03-10-2022  3.4cm   Arthritis    hips, back, neck    Benign localized prostatic hyperplasia with lower urinary tract symptoms (LUTS)    urologist--- dr nieves   CAD (coronary artery disease) 1994   cardiologist---  dr mona;   s/p angioplasty and stenting x3 1994;   nuclear stress test 10-15-2020  low risk, no ischemia, nuclear ef 59%   Chronic stable angina 2005   CKD (chronic kidney disease), stage III (HCC)    Complication of anesthesia    awareness during anesthesia;  w/ cholestectomy in 1990s pt stated thinks he stopped breathing   COPD mixed type (HCC)    pulmolonogist--- dr c. young   DDD (degenerative disc disease), cervical    Diverticulosis of colon    Dyspnea    chronic   Edema of both lower extremities    GERD (gastroesophageal reflux disease)    hx of   Hiatal hernia    History of adenomatous polyp of colon    History of kidney stones    History of lower GI bleeding 02/2022   admission in epic;   diverticular bleed , no transfusions   History  of melanoma    per pt excision forehead , localized   History of prediabetes    History of tuberculosis 1966   Hyperlipidemia    Hypertension    Neuromuscular disorder (HCC)    nerve issues with back    OSA on CPAP 2000   followed by dr c. young;   sleep study in epic , severe osa;   wears cpap auto set 5 to 20   PAD (peripheral artery disease)    vascular--- dr eliza;   hx bilateral CIA stenting in 1990s;  left occluded, dr gerlean s/p  right to left fem-fem bypass 09/ 2012,  renal artery stenosis and aaa   Parotid mass    followed by dr jesus  (ENT);  per pt stable   Peripheral vascular disease    PVC's (premature ventricular contractions)    Renal artery stenosis    02-14-2020  renal angiogram left > right renal stenosis   S/P primary angioplasty with coronary stent 1994    Medications:  Medications Prior to Admission  Medication Sig Dispense Refill Last Dose/Taking   albuterol  (VENTOLIN  HFA) 108 (90 Base) MCG/ACT inhaler INHALE 1 PUFF BY MOUTH INTO THE LUNGS TWICE DAILY AS NEEDED  FOR WHEEZING OR SHORTNESS OF BREATH 8.5 g 5    antiseptic oral rinse (BIOTENE) LIQD 15 mLs by Mouth Rinse route daily as needed for dry mouth.       aspirin  EC 81 MG tablet Take 81 mg by mouth daily. Swallow whole.      Azelastine -Fluticasone  137-50 MCG/ACT SUSP Place 1-2 sprays into both nostrils at bedtime. 23 g 3    Cholecalciferol (VITAMIN D-3 PO) Take 1 capsule by mouth every other day.      diltiazem  (DILT-XR) 120 MG 24 hr capsule Take 1 capsule (120 mg total) by mouth daily. 90 capsule 2    docusate sodium  (COLACE) 100 MG capsule Take 100 mg by mouth daily.      esomeprazole (NEXIUM) 40 MG capsule Take 40 mg by mouth daily as needed (acid reflux/indigestion.).      finasteride  (PROSCAR ) 5 MG tablet Take 1 tablet by mouth at bedtime.      furosemide  (LASIX ) 20 MG tablet TAKE 1 TABLET(20 MG) BY MOUTH DAILY AS NEEDED 30 tablet 0    irbesartan  (AVAPRO ) 150 MG tablet Take 150 mg by mouth every  evening.      isosorbide  mononitrate (IMDUR ) 60 MG 24 hr tablet Take 60 mg by mouth every evening.      loperamide  (IMODIUM  A-D) 2 MG tablet Take 2 mg by mouth as needed.      MAGNESIUM -OXIDE 400 (240 Mg) MG tablet Take 400 mg by mouth every other day.      Menthol , Topical Analgesic, (BLUE-EMU MAXIMUM STRENGTH EX) Apply 1 application topically 3 (three) times daily as needed (pain.).      Polyethyl Glycol-Propyl Glycol (SYSTANE ULTRA OP) Place 1 drop into both eyes daily as needed (Dry eye).      potassium chloride  (KLOR-CON  M) 10 MEQ tablet Take 1 tablet on the days Lasix  is taken. 30 tablet 2    rosuvastatin (CRESTOR) 10 MG tablet Take 10 mg by mouth daily.      simethicone  (MYLICON) 125 MG chewable tablet Chew 125 mg by mouth every 6 (six) hours as needed for flatulence. Takes gas x      Tiotropium Bromide-Olodaterol (STIOLTO RESPIMAT ) 2.5-2.5 MCG/ACT AERS Inhale 2 puffs into the lungs daily. 4 g 12    traMADol  (ULTRAM ) 50 MG tablet Take 1 tablet by mouth 4 (four) times daily as needed.      Scheduled:   amLODipine   5 mg Oral Daily   arformoterol   15 mcg Nebulization BID   budesonide  (PULMICORT ) nebulizer solution  0.25 mg Nebulization BID   Chlorhexidine  Gluconate Cloth  6 each Topical Daily   ipratropium-albuterol   3 mL Nebulization Q6H   mouth rinse  15 mL Mouth Rinse 4 times per day   potassium chloride   40 mEq Oral Once   revefenacin   175 mcg Nebulization Daily   PRN: acetaminophen  **OR** acetaminophen , dextrose , hydrALAZINE , HYDROmorphone  (DILAUDID ) injection, ondansetron  **OR** ondansetron  (ZOFRAN ) IV, mouth rinse  Assessment: 36 yoM with PMH AAA, CAD s/p PCI in 1994, PAD s/p stenting in 1990s and fem bypass in 2012, renal artery stenosis, CKD3, COPD, Hx LGIB, melanoma, Hx TB, presents 12/12 w/ weakness & dyspnea. CTA chest shows large L > R bilateral PE. Pharmacy consulted to start IV heparin .  Baseline INR, aPTT: not done Prior anticoagulation: none  Significant  events:  Today, 11/30/2024: 08:02 Heparin  level = 0.61, therapeutic on 1100 units/hr CBC: WNL No bleeding or infusion issues per nursing   Goal of Therapy: Heparin  level 0.3-0.7 units/ml  Monitor platelets by anticoagulation protocol: Yes  Plan: Continue Heparin  1100 units/hr IV infusion Daily CBC, daily heparin  level once stable Monitor for signs of bleeding or thrombosis  Thank you for allowing pharmacy to be a part of this patients care.  Eleanor EMERSON Agent, PharmD, BCPS Clinical Pharmacist [place] 11/30/2024 10:01 AM        [1]  Allergies Allergen Reactions   Penicillins     Childhood reaction  REACTION: rapid pulse Did it involve swelling of the face/tongue/throat, SOB, or low BP? Yes Did it involve sudden or severe rash/hives, skin peeling, or any reaction on the inside of your mouth or nose? Unknown Did you need to seek medical attention at a hospital or doctor's office? yes When did it last happen?      childhood  If all above answers are NO, may proceed with cephalosporin use.    Fish Oil     Other reaction(s): Heart attack symptoms   Other     Oily seafood salmon-- chest pain   Penicillin G     Other reaction(s): Unknown   Mold Extract [Trichophyton] Rash

## 2024-11-30 NOTE — Progress Notes (Signed)
 Echocardiogram 2D Echocardiogram has been performed.  Ramandeep Arington N Shyvonne Chastang,RDCS 11/30/2024, 9:27 AM

## 2024-11-30 NOTE — Progress Notes (Signed)
 CTA Chest shows evidence of left lower lobe mucous plugging. Will need aggressive mucociliary cleraance. Pulmonary treatments including hypertonic saline, duonebs, chest PT placed.

## 2024-12-01 DIAGNOSIS — Z515 Encounter for palliative care: Secondary | ICD-10-CM

## 2024-12-01 LAB — LUPUS ANTICOAGULANT PANEL
DRVVT: 87.6 s — ABNORMAL HIGH (ref 0.0–47.0)
PTT Lupus Anticoagulant: 47.9 s — ABNORMAL HIGH (ref 0.0–43.5)

## 2024-12-01 LAB — HEPARIN LEVEL (UNFRACTIONATED)
Heparin Unfractionated: 0.71 [IU]/mL — ABNORMAL HIGH (ref 0.30–0.70)
Heparin Unfractionated: 0.45 [IU]/mL (ref 0.30–0.70)
Heparin Unfractionated: 0.48 [IU]/mL (ref 0.30–0.70)

## 2024-12-01 LAB — COMPREHENSIVE METABOLIC PANEL WITH GFR
ALT: 6 U/L (ref 0–44)
AST: 23 U/L (ref 15–41)
Albumin: 3.2 g/dL — ABNORMAL LOW (ref 3.5–5.0)
Alkaline Phosphatase: 108 U/L (ref 38–126)
Anion gap: 13 (ref 5–15)
BUN: 18 mg/dL (ref 8–23)
CO2: 27 mmol/L (ref 22–32)
Calcium: 8.5 mg/dL — ABNORMAL LOW (ref 8.9–10.3)
Chloride: 103 mmol/L (ref 98–111)
Creatinine, Ser: 1.85 mg/dL — ABNORMAL HIGH (ref 0.61–1.24)
GFR, Estimated: 37 mL/min — ABNORMAL LOW (ref >60)
Glucose, Bld: 111 mg/dL — ABNORMAL HIGH (ref 70–99)
Potassium: 3.5 mmol/L (ref 3.5–5.1)
Sodium: 143 mmol/L (ref 135–145)
Total Bilirubin: 0.6 mg/dL (ref 0.0–1.2)
Total Protein: 6.7 g/dL (ref 6.5–8.1)

## 2024-12-01 LAB — GLUCOSE, CAPILLARY
Glucose-Capillary: 109 mg/dL — ABNORMAL HIGH (ref 70–99)
Glucose-Capillary: 125 mg/dL — ABNORMAL HIGH (ref 70–99)
Glucose-Capillary: 110 mg/dL — ABNORMAL HIGH (ref 70–99)
Glucose-Capillary: 97 mg/dL (ref 70–99)
Glucose-Capillary: 94 mg/dL (ref 70–99)
Glucose-Capillary: 102 mg/dL — ABNORMAL HIGH (ref 70–99)

## 2024-12-01 LAB — DRVVT CONFIRM: dRVVT Confirm: 1.4 ratio — ABNORMAL HIGH (ref 0.8–1.2)

## 2024-12-01 LAB — CBC WITH DIFFERENTIAL/PLATELET
Abs Immature Granulocytes: 0.07 K/uL (ref 0.00–0.07)
Basophils Absolute: 0.1 K/uL (ref 0.0–0.1)
Basophils Relative: 1 %
Eosinophils Absolute: 0.1 K/uL (ref 0.0–0.5)
Eosinophils Relative: 1 %
HCT: 43.5 % (ref 39.0–52.0)
Hemoglobin: 13.7 g/dL (ref 13.0–17.0)
Immature Granulocytes: 1 %
Lymphocytes Relative: 9 %
Lymphs Abs: 0.9 K/uL (ref 0.7–4.0)
MCH: 30.8 pg (ref 26.0–34.0)
MCHC: 31.5 g/dL (ref 30.0–36.0)
MCV: 97.8 fL (ref 80.0–100.0)
Monocytes Absolute: 1 K/uL (ref 0.1–1.0)
Monocytes Relative: 10 %
Neutro Abs: 7.6 K/uL (ref 1.7–7.7)
Neutrophils Relative %: 78 %
Platelets: 212 K/uL (ref 150–400)
RBC: 4.45 MIL/uL (ref 4.22–5.81)
RDW: 15.2 % (ref 11.5–15.5)
WBC: 9.6 K/uL (ref 4.0–10.5)
nRBC: 0 % (ref 0.0–0.2)

## 2024-12-01 LAB — PROTEIN S, TOTAL: Protein S Ag, Total: 97 % (ref 60–150)

## 2024-12-01 LAB — PTT-LA MIX: PTT-LA Mix: 43.6 s — ABNORMAL HIGH (ref 0.0–40.5)

## 2024-12-01 LAB — PHOSPHORUS: Phosphorus: 3.7 mg/dL (ref 2.5–4.6)

## 2024-12-01 LAB — MAGNESIUM: Magnesium: 2.3 mg/dL (ref 1.7–2.4)

## 2024-12-01 LAB — HEXAGONAL PHASE PHOSPHOLIPID: Hexagonal Phase Phospholipid: 3 s (ref 0–11)

## 2024-12-01 LAB — DRVVT MIX: dRVVT Mix: 52.6 s — ABNORMAL HIGH (ref 0.0–40.4)

## 2024-12-01 MED ORDER — POLYETHYLENE GLYCOL 3350 17 G PO PACK
17.0000 g | PACK | Freq: Two times a day (BID) | ORAL | Status: DC
  Filled 2024-12-01: qty 1

## 2024-12-01 MED ORDER — SENNOSIDES-DOCUSATE SODIUM 8.6-50 MG PO TABS
1.0000 | ORAL_TABLET | Freq: Two times a day (BID) | ORAL | Status: DC
  Administered 2024-12-02 – 2024-12-03 (×3): 1 via ORAL
  Filled 2024-12-01 (×4): qty 1

## 2024-12-01 MED ORDER — ORAL CARE MOUTH RINSE
15.0000 mL | OROMUCOSAL | Status: DC
  Administered 2024-12-01 – 2024-12-04 (×12): 15 mL via OROMUCOSAL

## 2024-12-01 MED ORDER — KCL IN DEXTROSE-NACL 40-5-0.9 MEQ/L-%-% IV SOLN
INTRAVENOUS | Status: AC
  Filled 2024-12-01 (×2): qty 1000

## 2024-12-01 MED ORDER — ORAL CARE MOUTH RINSE
15.0000 mL | OROMUCOSAL | Status: DC | PRN
  Administered 2024-12-04: 18:00:00 15 mL via OROMUCOSAL

## 2024-12-01 NOTE — TOC Initial Note (Signed)
 Transition of Care (TOC) - Initial/Assessment Note    Patient Details  Name: Dean [name]. MRN: [id number] Date of Birth: 01/25/1946  Transition of Care [facility]) CM/SW Contact:    Dean ONEIDA Anon, RN Phone Number: 12/01/2024, 1:31 PM  Clinical Narrative:                 Pt is from home. Pt came to the ED via EMS with complaints of generalized weakness, wet cough x several days with SOB. Pt admitted with diagnosis of pulmonary embolism. Pt with need for O2, not on home O2 at baseline. Pt needing continued medical workup, not medically stable for discharge. Pt screened with SDOH needs identified for social connections and transportation. Will add resources to AVS. PT/OT consulted, awaiting any new recommendations. ICM will continue to follow for DC planning needs.      Expected Discharge Plan: Home w Home Health Services Barriers to Discharge: Continued Medical Work up   Patient Goals and CMS Choice Patient states their goals for this hospitalization and ongoing recovery are:: Return home CMS Medicare.gov Compare Post Acute Care list provided to:: Patient Choice offered to / list presented to : [name] in [facility].provided to:: Patient    Expected Discharge Plan and Services In-house Referral: Chaplain Discharge Planning Services: CM Consult Post Acute Care Choice: Home Health Living arrangements for the past 2 months: Single Family Home                 DME Arranged: N/A DME Agency: NA       HH Arranged: NA HH Agency: NA        Prior Living Arrangements/Services Living arrangements for the past 2 months: Single Family Home Lives with:: Self Patient language and need for interpreter reviewed:: Yes Do you feel safe going back to the place where you live?: Yes      Need for Family Participation in Patient Care: Yes (Comment) Care giver support system in place?: Yes (comment) Current home services: DME Criminal  Activity/Legal Involvement Pertinent to Current Situation/Hospitalization: No - Comment as needed  Activities of Daily Living   ADL Screening (condition at time of admission) Independently performs ADLs?: No Does the patient have a NEW difficulty with bathing/dressing/toileting/self-feeding that is expected to last >3 days?: Yes (Initiates electronic notice to provider for possible OT consult) Does the patient have a NEW difficulty with getting in/out of bed, walking, or climbing stairs that is expected to last >3 days?: Yes (Initiates electronic notice to provider for possible PT consult) Does the patient have a NEW difficulty with communication that is expected to last >3 days?: No Is the patient deaf or have difficulty hearing?: No Does the patient have difficulty seeing, even when wearing glasses/contacts?: No Does the patient have difficulty concentrating, remembering, or making decisions?: No  Permission Sought/Granted Permission sought to share information with : Family Supports    Share Information with NAME: Dean [name], Emergency Contact  [phone]           Emotional Assessment Appearance:: Other (Comment Required (UTA) Attitude/Demeanor/Rapport: Unable to Assess Affect (typically observed): Unable to Assess Orientation: : Oriented to Self, Oriented to Place, Oriented to Situation Alcohol  / Substance Use: Not Applicable Psych Involvement: No (comment)  Admission diagnosis:  Pulmonary embolism (HCC) [I26.99] Multiple subsegmental pulmonary emboli without acute cor pulmonale (HCC) [I26.94] Patient Active Problem List   Diagnosis Date Noted   Pulmonary embolism (HCC) 11/29/2024   Avascular necrosis of  bone of right hip (HCC) 11/29/2024   Diverticulosis of intestine, part unspecified, without perforation or abscess with bleeding 11/29/2024   Aortic atherosclerosis 11/29/2024   Pressure injury of skin 11/29/2024   Elevated brain natriuretic peptide (BNP)  level 11/29/2024   Elevated troponin 11/29/2024   Hx of colonic polyps 03/29/2022   Rectal bleeding    Anemia of chronic disease 03/11/2022   CKD (chronic kidney disease), stage III (HCC) 03/11/2022   GI bleed 03/11/2022   Acute GI bleeding 03/10/2022   Excessive sleepiness 06/28/2021   Ureteropelvic junction (UPJ) obstruction, right 11/20/2020   OA (osteoarthritis) of hip 07/15/2020   S/P right THA 07/15/2020   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   Atherosclerosis of native coronary artery of native heart without angina pectoris 01/30/2018   PAOD (peripheral arterial occlusive disease) 01/30/2018   COPD with chronic bronchitis (HCC) 01/30/2018   Parotid mass 03/26/2017   Status post bypass graft of extremity 02/06/2017   Back pain of thoracolumbar region 03/31/2015   SOB (shortness of breath) 12/25/2014   PVC's (premature ventricular contractions) 12/25/2014   Abnormal EKG 12/25/2014   PAD (peripheral artery disease) 12/25/2014   Dyslipidemia 12/25/2014   Current smoker 12/25/2014   Aftercare following surgery of the circulatory system, NEC 12/23/2013   Screening for depression 11/06/2013   Cough 06/24/2013   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   Blood glucose elevated 11/02/2012   Candidal dermatitis 11/02/2011   Adiposity 07/11/2011   Accumulated fluid in larynx 09/24/2010   Atypical chest pain 09/23/2010   Lumbar canal stenosis 08/05/2009   COPD mixed type (HCC) 04/15/2009   TOBACCO USER 03/10/2009   Allergic rhinitis 03/10/2009   GERD 05/20/2008   ABDOMINAL PAIN-EPIGASTRIC 05/20/2008   Hyperlipidemia 04/11/2008   Essential hypertension 04/11/2008   Coronary atherosclerosis 04/11/2008   PROCTITIS 04/11/2008   Obstructive sleep apnea 03/01/2008   Lung nodule 02/29/2008   RENAL CYST, LEFT 06/15/2007   COLONIC POLYPS, ADENOMATOUS 08/11/2005   Diverticulosis of colon with hemorrhage 08/11/2005   Internal hemorrhoids 06/04/2002    PCP:  Dean Ade, MD Pharmacy:   [id number] - [name], [name] - [address] AT [facility] [address] & [facility] [address] [place] [address] Phone: [phone] Fax: [phone]     Social Drivers of Health (SDOH) Social History: SDOH Screenings   Food Insecurity: No Food Insecurity (11/29/2024)  Housing: Low Risk (11/29/2024)  Transportation Needs: Unmet Transportation Needs (11/29/2024)  Utilities: Not At Risk (11/29/2024)  Social Connections: Socially Isolated (11/29/2024)  Tobacco Use: High Risk (11/29/2024)   SDOH Interventions:     Readmission Risk Interventions    12/01/2024    1:27 PM  Readmission Risk Prevention Plan  Transportation Screening Complete  PCP or Specialist Appt within 5-7 Days Complete  Home Care Screening Complete  Medication Review (RN CM) Complete

## 2024-12-01 NOTE — Progress Notes (Signed)
°   12/01/24 1150  Spiritual Encounters  Type of Visit Initial  Care provided to: Family  Referral source Nurse (RN/NT/LPN)  Reason for visit Urgent spiritual support  OnCall Visit Yes   Chaplain responded to a spiritual consult for advanced directive education and conversation.  I reviewed the paperwork with the family members clarifying the different sections as well as the parts that the patient, Mariusz will be involved with. Their intention is to complete the paperwork today and have a Chaplain come by for the next steps tomorrow.   Trish [employer]  [phone]

## 2024-12-01 NOTE — Progress Notes (Signed)
 PHARMACY - ANTICOAGULATION CONSULT NOTE  Pharmacy Consult for IV heparin  Indication: pulmonary embolus  Allergies[1]  Patient Measurements: Height: 5' 5 (165.1 cm) Weight: 67.4 kg (148 lb 9.4 oz) IBW/kg (Calculated) : 61.5 HEPARIN  DW (KG): 67.4  Vital Signs: Temp: 98.7 F (37.1 C) (12/14 2022) Temp Source: Oral (12/14 2022) BP: 163/76 (12/14 2200) Pulse Rate: 76 (12/14 2200)  Labs: Recent Labs    11/29/24 0701 11/29/24 2239 11/30/24 0249 11/30/24 0802 12/01/24 0304 12/01/24 1356 12/01/24 2149  HGB 14.5  --  13.6  --  13.7  --   --   HCT 44.6  --  42.4  --  43.5  --   --   PLT 202  --  187  --  212  --   --   HEPARINUNFRC  --    < >  --    < > 0.71* 0.45 0.48  CREATININE 1.74*  --  1.53*  --  1.85*  --   --    < > = values in this interval not displayed.    Estimated Creatinine Clearance: 29.1 mL/min (A) (by C-G formula based on SCr of 1.85 mg/dL (H)).   Medical History: Past Medical History:  Diagnosis Date   AAA (abdominal aortic aneurysm) 2021   followed by dr valerie;   incidental finding infrarenal aaa 3.1 x 2.8 per abd/pelvis ct 02-26-20 epic;   last CT  03-10-2022  3.4cm   Arthritis    hips, back, neck    Benign localized prostatic hyperplasia with lower urinary tract symptoms (LUTS)    urologist--- dr nieves   CAD (coronary artery disease) 1994   cardiologist---  dr mona;   s/p angioplasty and stenting x3 1994;   nuclear stress test 10-15-2020  low risk, no ischemia, nuclear ef 59%   Chronic stable angina 2005   CKD (chronic kidney disease), stage III (HCC)    Complication of anesthesia    awareness during anesthesia;  w/ cholestectomy in 1990s pt stated thinks he stopped breathing   COPD mixed type (HCC)    pulmolonogist--- dr c. young   DDD (degenerative disc disease), cervical    Diverticulosis of colon    Dyspnea    chronic   Edema of both lower extremities    GERD (gastroesophageal reflux disease)    hx of   Hiatal hernia    History of  adenomatous polyp of colon    History of kidney stones    History of lower GI bleeding 02/2022   admission in epic;   diverticular bleed , no transfusions   History of melanoma    per pt excision forehead , localized   History of prediabetes    History of tuberculosis 1966   Hyperlipidemia    Hypertension    Neuromuscular disorder (HCC)    nerve issues with back    OSA on CPAP 2000   followed by dr c. young;   sleep study in epic , severe osa;   wears cpap auto set 5 to 20   PAD (peripheral artery disease)    vascular--- dr eliza;   hx bilateral CIA stenting in 1990s;  left occluded, dr gerlean s/p  right to left fem-fem bypass 09/ 2012,  renal artery stenosis and aaa   Parotid mass    followed by dr jesus  (ENT);  per pt stable   Peripheral vascular disease    PVC's (premature ventricular contractions)    Renal artery stenosis    02-14-2020  renal angiogram left > right renal stenosis   S/P primary angioplasty with coronary stent 1994    Medications:  Medications Prior to Admission  Medication Sig Dispense Refill Last Dose/Taking   albuterol  (VENTOLIN  HFA) 108 (90 Base) MCG/ACT inhaler INHALE 1 PUFF BY MOUTH INTO THE LUNGS TWICE DAILY AS NEEDED FOR WHEEZING OR SHORTNESS OF BREATH 8.5 g 5 Past Week   antiseptic oral rinse (BIOTENE) LIQD 15 mLs by Mouth Rinse route daily as needed for dry mouth.    Unknown   aspirin  EC 81 MG tablet Take 81 mg by mouth daily. Swallow whole.   Unknown   Azelastine -Fluticasone  137-50 MCG/ACT SUSP Place 1-2 sprays into both nostrils at bedtime. (Patient not taking: Reported on 11/30/2024) 23 g 3 Not Taking   Cholecalciferol (VITAMIN D-3 PO) Take 1 capsule by mouth every other day.      diltiazem  (DILT-XR) 120 MG 24 hr capsule Take 1 capsule (120 mg total) by mouth daily. (Patient not taking: Reported on 11/30/2024) 90 capsule 2 Not Taking   docusate sodium  (COLACE) 100 MG capsule Take 100 mg by mouth daily.      esomeprazole (NEXIUM) 40 MG capsule Take  40 mg by mouth daily as needed (acid reflux/indigestion.).      finasteride  (PROSCAR ) 5 MG tablet Take 1 tablet by mouth at bedtime.      furosemide  (LASIX ) 20 MG tablet TAKE 1 TABLET(20 MG) BY MOUTH DAILY AS NEEDED 30 tablet 0    irbesartan  (AVAPRO ) 150 MG tablet Take 150 mg by mouth every evening.      isosorbide  mononitrate (IMDUR ) 60 MG 24 hr tablet Take 60 mg by mouth every evening.      loperamide  (IMODIUM  A-D) 2 MG tablet Take 2 mg by mouth as needed.      MAGNESIUM -OXIDE 400 (240 Mg) MG tablet Take 400 mg by mouth every other day.      Menthol , Topical Analgesic, (BLUE-EMU MAXIMUM STRENGTH EX) Apply 1 application topically 3 (three) times daily as needed (pain.).      Polyethyl Glycol-Propyl Glycol (SYSTANE ULTRA OP) Place 1 drop into both eyes daily as needed (Dry eye).      potassium chloride  (KLOR-CON  M) 10 MEQ tablet Take 1 tablet on the days Lasix  is taken. 30 tablet 2    rosuvastatin (CRESTOR) 10 MG tablet Take 10 mg by mouth daily.      simethicone  (MYLICON) 125 MG chewable tablet Chew 125 mg by mouth every 6 (six) hours as needed for flatulence. Takes gas x      Tiotropium Bromide-Olodaterol (STIOLTO RESPIMAT ) 2.5-2.5 MCG/ACT AERS Inhale 2 puffs into the lungs daily. 4 g 12    traMADol  (ULTRAM ) 50 MG tablet Take 1 tablet by mouth 4 (four) times daily as needed.      Scheduled:   amLODipine   5 mg Oral Daily   arformoterol   15 mcg Nebulization BID   budesonide  (PULMICORT ) nebulizer solution  0.25 mg Nebulization BID   Chlorhexidine  Gluconate Cloth  6 each Topical Daily   ipratropium-albuterol   3 mL Nebulization Q4H   mouth rinse  15 mL Mouth Rinse 4 times per day   polyethylene glycol  17 g Oral BID   potassium chloride   40 mEq Oral Once   revefenacin   175 mcg Nebulization Daily   senna-docusate  1 tablet Oral BID   sodium chloride  HYPERTONIC  4 mL Nebulization Q4H   PRN: acetaminophen  **OR** acetaminophen , dextrose , hydrALAZINE , HYDROmorphone  (DILAUDID ) injection,  labetalol , ondansetron  **OR** ondansetron  (ZOFRAN ) IV,  mouth rinse  Assessment: 35 yoM with PMH AAA, CAD s/p PCI in 1994, PAD s/p stenting in 1990s and fem bypass in 2012, renal artery stenosis, CKD3, COPD, Hx LGIB, melanoma, Hx TB, presents 12/12 w/ weakness & dyspnea. CTA chest shows large L > R bilateral PE. Pharmacy consulted to start IV heparin .  Baseline INR, aPTT: not done Prior anticoagulation: none  Significant events:  Today, 12/01/2024: 13:56 Heparin  level = 0.45, therapeutic with heparin  infusing at 1000 units/hr CBC WNL No bleeding per nursing  2149 heparin  level 0.48, therapeutic No bleeding noted   Goal of Therapy: Heparin  level 0.3-0.7 units/ml Monitor platelets by anticoagulation protocol: Yes  Plan: Continue IV Heparin  at 1000 units/hr IV infusion Daily CBC, daily heparin  level once stable Monitor for signs of bleeding or thrombosis   Thank you for allowing pharmacy to be a part of this patients care.  Leeroy Mace RPh 12/01/2024, 10:53 PM            [1]  Allergies Allergen Reactions   Penicillins     Childhood reaction  REACTION: rapid pulse Did it involve swelling of the face/tongue/throat, SOB, or low BP? Yes Did it involve sudden or severe rash/hives, skin peeling, or any reaction on the inside of your mouth or nose? Unknown Did you need to seek medical attention at a hospital or doctor's office? yes When did it last happen?      childhood  If all above answers are NO, may proceed with cephalosporin use.    Fish Oil     Other reaction(s): Heart attack symptoms   Other     Oily seafood salmon-- chest pain   Penicillin G     Other reaction(s): Unknown   Mold Extract [Trichophyton] Rash

## 2024-12-01 NOTE — Consult Note (Signed)
 Consultation Note Date: 12/01/2024   Patient Name: Dean [name].  DOB: February 15, 1946  MRN: [id number]  Age / Sex: 78 y.o., male  PCP: Shepard Ade, MD Referring Physician: Sherrill Alejandro Donovan, DO  Reason for Consultation: Establishing goals of care  HPI/Patient Profile: 78 y.o. male  with past medical history of   admitted on 11/29/2024 with  .  78 year old male with past medical history as below, which is significant for coronary artery disease, stage III CKD, COPD, OSA on CPAP, renal artery stenosis, AAA, and GI bleed. Who presented to [facility] emergency department 12/12 with complaints of generalized weakness and dyspnea. Evaluation in the emergency department included chest x-ray which showed cardiomegaly and chronic interstitial prominence. CT angiogram of the chest demonstrated large left-sided pulmonary embolism and smaller pulmonary embolism on the right. RV LV ratio 0.9. He was admitted to the hospital service on a heparin  infusion. PCCM also consulted due to increasing O2 requirement.  PMT for code status and goals of care.   Clinical Assessment and Goals of Care:  Remains on HHFNC, imaging with evolving pulmonary infarct versus mucous plugging. Patient with shortness of breath and thick sputum, patient expressed to nursing staff overnight that he is dying, patient's brother has also arrived from [place], [place].  PMT consult continues.  Chart reviewed, patient seen and examined.  Palliative medicine is specialized medical care for people living with serious illness. It focuses on providing relief from the symptoms and stress of a serious illness. The goal is to improve quality of life for both the patient and the family. Goals of care: Broad aims of medical therapy in relation to the patient's values and preferences. Our aim is to provide medical care aimed at enabling patients to achieve  the goals that matter most to them, given the circumstances of their particular medical situation and their constraints.   Patient is awake alert, resting in bed, friend at bedside, and then his brother also arrived later on. We reviewed the patient's current condition as well as his underlying conditions. Goals wishes and values attempted to be explored. Additionally, code status discussions undertaken - full code versus DNR DNI explained in detail, to the best of my ability, brother tearful at times, offered support, PCCM and SLP recommendations also reviewed.   NEXT OF KIN  Brother and also friend present at bedside.   SUMMARY OF RECOMMENDATIONS    DNR DNI with limited interventions MOST form filled out Allow for comfort feeds Time limited trial of current interventions for the next 2-3 days - if improvement - consider SNF rehab with palliative if decline/further decompensation - then comfort care and residential hospice facility transfer PMT to follow.   Code Status/Advance Care Planning: DNR   Symptom Management:     Palliative Prophylaxis:  Delirium Protocol  Additional Recommendations (Limitations, Scope, Preferences): No Artificial Feeding  Psycho-social/Spiritual:  Desire for further Chaplaincy support:yes Additional Recommendations: Caregiving  Support/Resources  Prognosis:  Unable to determine  Discharge Planning: To Be Determined  Primary Diagnoses: Present on Admission:  Pulmonary embolism (HCC)  Hyperlipidemia  Coronary atherosclerosis  PAD (peripheral artery disease)  Obstructive sleep apnea  COPD mixed type (HCC)  Essential hypertension  GERD  Aortic atherosclerosis  TOBACCO USER  Pressure injury of skin  Elevated brain natriuretic peptide (BNP) level  Elevated troponin   I have reviewed the medical record, interviewed the patient and family, and examined the patient. The following aspects are pertinent.  Past Medical History:  Diagnosis  Date   AAA (abdominal aortic aneurysm) 2021   followed by dr valerie;   incidental finding infrarenal aaa 3.1 x 2.8 per abd/pelvis ct 02-26-20 epic;   last CT  03-10-2022  3.4cm   Arthritis    hips, back, neck    Benign localized prostatic hyperplasia with lower urinary tract symptoms (LUTS)    urologist--- dr nieves   CAD (coronary artery disease) 1994   cardiologist---  dr mona;   s/p angioplasty and stenting x3 1994;   nuclear stress test 10-15-2020  low risk, no ischemia, nuclear ef 59%   Chronic stable angina 2005   CKD (chronic kidney disease), stage III (HCC)    Complication of anesthesia    awareness during anesthesia;  w/ cholestectomy in 1990s pt stated thinks he stopped breathing   COPD mixed type (HCC)    pulmolonogist--- dr c. young   DDD (degenerative disc disease), cervical    Diverticulosis of colon    Dyspnea    chronic   Edema of both lower extremities    GERD (gastroesophageal reflux disease)    hx of   Hiatal hernia    History of adenomatous polyp of colon    History of kidney stones    History of lower GI bleeding 02/2022   admission in epic;   diverticular bleed , no transfusions   History of melanoma    per pt excision forehead , localized   History of prediabetes    History of tuberculosis 1966   Hyperlipidemia    Hypertension    Neuromuscular disorder (HCC)    nerve issues with back    OSA on CPAP 2000   followed by dr c. young;   sleep study in epic , severe osa;   wears cpap auto set 5 to 20   PAD (peripheral artery disease)    vascular--- dr eliza;   hx bilateral CIA stenting in 1990s;  left occluded, dr gerlean s/p  right to left fem-fem bypass 09/ 2012,  renal artery stenosis and aaa   Parotid mass    followed by dr jesus  (ENT);  per pt stable   Peripheral vascular disease    PVC's (premature ventricular contractions)    Renal artery stenosis    02-14-2020  renal angiogram left > right renal stenosis   S/P primary angioplasty with  coronary stent 1994   Social History   Socioeconomic History   Marital status: Single    Spouse name: Not on file   Number of children: Not on file   Years of education: Not on file   Highest education level: Not on file  Occupational History   Occupation: retired runner, 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  Tobacco Use   Smoking status: Every Day    Current packs/day: 1.50    Average packs/day: 1.5 packs/day for 51.0 years (76.5 ttl pk-yrs)    Types: Cigarettes    Passive exposure: Never   Smokeless tobacco: Never  Vaping Use   Vaping status: Never  Used  Substance and Sexual Activity   Alcohol  use: Not Currently    Alcohol /week: 0.0 standard drinks of alcohol    Drug use: Never   Sexual activity: Not on file  Other Topics Concern   Not on file  Social History Narrative   Travelled to [place] and [place] 2008, [place] 2009   Social Drivers of Health   Tobacco Use: High Risk (11/29/2024)   Patient History    Smoking Tobacco Use: Every Day    Smokeless Tobacco Use: Never    Passive Exposure: Never  Financial Resource Strain: Not on file  Food Insecurity: No Food Insecurity (11/29/2024)   Epic    Worried About Programme Researcher, Broadcasting/film/video in the Last Year: Never true    Ran Out of Food in the Last Year: Never true  Transportation Needs: Unmet Transportation Needs (11/29/2024)   Epic    Lack of Transportation (Medical): Yes    Lack of Transportation (Non-Medical): Yes  Physical Activity: Not on file  Stress: Not on file  Social Connections: Socially Isolated (11/29/2024)   Social Connection and Isolation Panel    Frequency of Communication with Friends and Family: Once a week    Frequency of Social Gatherings with Friends and Family: Once a week    Attends Religious Services: Never    Database Administrator or Organizations: No    Attends Banker Meetings: Never    Marital Status: Divorced  Depression (PHQ2-9): Not on file  Alcohol  Screen: Not on file  Housing:  Low Risk (11/29/2024)   Epic    Unable to Pay for Housing in the Last Year: No    Number of Times Moved in the Last Year: 0    Homeless in the Last Year: No  Utilities: Not At Risk (11/29/2024)   Epic    Threatened with loss of utilities: No  Health Literacy: Not on file   Family History  Problem Relation Age of Onset   Other Father        ETOH   Diabetes Father    Hypertension Father    Kidney cancer Mother    Cancer Mother        renal   Hyperlipidemia Mother    Hypertension Mother    Cancer Sister        breast   Hyperlipidemia Sister    Hypertension Sister    Breast cancer Sister    Hyperlipidemia Brother    Hypertension Brother    Prostate cancer Brother    Colon cancer Neg Hx    Colon polyps Neg Hx    Esophageal cancer Neg Hx    Rectal cancer Neg Hx    Stomach cancer Neg Hx    Scheduled Meds:  amLODipine   5 mg Oral Daily   arformoterol   15 mcg Nebulization BID   budesonide  (PULMICORT ) nebulizer solution  0.25 mg Nebulization BID   Chlorhexidine  Gluconate Cloth  6 each Topical Daily   ipratropium-albuterol   3 mL Nebulization Q4H   mouth rinse  15 mL Mouth Rinse 4 times per day   potassium chloride   40 mEq Oral Once   revefenacin   175 mcg Nebulization Daily   sodium chloride  HYPERTONIC  4 mL Nebulization Q4H   Continuous Infusions:  heparin  1,000 Units/hr (12/01/24 1000)   PRN Meds:.acetaminophen  **OR** acetaminophen , dextrose , hydrALAZINE , HYDROmorphone  (DILAUDID ) injection, labetalol , ondansetron  **OR** ondansetron  (ZOFRAN ) IV, mouth rinse Medications Prior to Admission:  Prior to Admission medications  Medication Sig Start Date End Date Taking?  Authorizing Provider  albuterol  (VENTOLIN  HFA) 108 (90 Base) MCG/ACT inhaler INHALE 1 PUFF BY MOUTH INTO THE LUNGS TWICE DAILY AS NEEDED FOR WHEEZING OR SHORTNESS OF BREATH 08/08/24  Yes Young, Clinton D, MD  antiseptic oral rinse (BIOTENE) LIQD 15 mLs by Mouth Rinse route daily as needed for dry mouth.    Yes  [provider]  aspirin  EC 81 MG tablet Take 81 mg by mouth daily. Swallow whole.   Yes [provider]  Azelastine -Fluticasone  137-50 MCG/ACT SUSP Place 1-2 sprays into both nostrils at bedtime. Patient not taking: Reported on 11/30/2024 05/10/23   Neysa Rama D, MD  Cholecalciferol (VITAMIN D-3 PO) Take 1 capsule by mouth every other day.    [provider]  diltiazem  (DILT-XR) 120 MG 24 hr capsule Take 1 capsule (120 mg total) by mouth daily. Patient not taking: Reported on 11/30/2024 04/13/23   Mona Vinie BROCKS, MD  docusate sodium  (COLACE) 100 MG capsule Take 100 mg by mouth daily.    [provider]  esomeprazole (NEXIUM) 40 MG capsule Take 40 mg by mouth daily as needed (acid reflux/indigestion.). 02/07/19   [provider]  finasteride  (PROSCAR ) 5 MG tablet Take 1 tablet by mouth at bedtime.    [provider]  furosemide  (LASIX ) 20 MG tablet TAKE 1 TABLET(20 MG) BY MOUTH DAILY AS NEEDED 05/28/24   Hilty, Vinie BROCKS, MD  irbesartan  (AVAPRO ) 150 MG tablet Take 150 mg by mouth every evening. 11/11/19   [provider]  isosorbide  mononitrate (IMDUR ) 60 MG 24 hr tablet Take 60 mg by mouth every evening.    [provider]  loperamide  (IMODIUM  A-D) 2 MG tablet Take 2 mg by mouth as needed.    [provider]  MAGNESIUM -OXIDE 400 (240 Mg) MG tablet Take 400 mg by mouth every other day.    [provider]  Menthol , Topical Analgesic, (BLUE-EMU MAXIMUM STRENGTH EX) Apply 1 application topically 3 (three) times daily as needed (pain.).    [provider]  Polyethyl Glycol-Propyl Glycol (SYSTANE ULTRA OP) Place 1 drop into both eyes daily as needed (Dry eye).    [provider]  potassium chloride  (KLOR-CON  M) 10 MEQ tablet Take 1 tablet on the days Lasix  is taken. 04/13/23   Hilty, Vinie BROCKS, MD  rosuvastatin (CRESTOR) 10 MG tablet Take 10 mg by mouth daily.    [provider]   simethicone  (MYLICON) 125 MG chewable tablet Chew 125 mg by mouth every 6 (six) hours as needed for flatulence. Takes gas x    [provider]  Tiotropium Bromide-Olodaterol (STIOLTO RESPIMAT ) 2.5-2.5 MCG/ACT AERS Inhale 2 puffs into the lungs daily. 06/04/24   Neysa Rama BIRCH, MD  traMADol  (ULTRAM ) 50 MG tablet Take 1 tablet by mouth 4 (four) times daily as needed. 03/21/22   [provider]   Allergies[1] Review of Systems +shortness of breath Physical Exam Weak appearing gentleman On HHFNC Monitor noted Awake alert oriented to person place time and situation  Trace LE edema.   Vital Signs: BP (!) 167/64   Pulse 80   Temp 98.8 F (37.1 C) (Axillary)   Resp 15   Ht 5' 5 (1.651 m)   Wt 67.4 kg   SpO2 93%   BMI 24.73 kg/m  Pain Scale: 0-10   Pain Score: 0-No pain   SpO2: SpO2: 93 % O2 Device:SpO2: 93 % O2 Flow Rate: .O2 Flow Rate (L/min): 50 L/min  IO: Intake/output summary:  Intake/Output Summary (Last 24  hours) at 12/01/2024 1215 Last data filed at 12/01/2024 1000 Gross per 24 hour  Intake 1851.25 ml  Output 1050 ml  Net 801.25 ml    LBM: Last BM Date : 11/29/24 Baseline Weight: Weight: 67.4 kg Most recent weight: Weight: 67.4 kg     Palliative Assessment/Data:   PPS 40%  Time In:  11 Time Out:  12.15 Time Total:  75 Greater than 50%  of this time was spent counseling and coordinating care related to the above assessment and plan.  Signed by: Lonia Serve, MD   Please contact Palliative Medicine Team phone at [phone] for questions and concerns.  For individual provider: See Amion                 [1]  Allergies Allergen Reactions   Penicillins     Childhood reaction  REACTION: rapid pulse Did it involve swelling of the face/tongue/throat, SOB, or low BP? Yes Did it involve sudden or severe rash/hives, skin peeling, or any reaction on the inside of your mouth or nose? Unknown Did you need to seek medical attention at  a hospital or doctor's office? yes When did it last happen?      childhood  If all above answers are NO, may proceed with cephalosporin use.    Fish Oil     Other reaction(s): Heart attack symptoms   Other     Oily seafood salmon-- chest pain   Penicillin G     Other reaction(s): Unknown   Mold Extract [Trichophyton] Rash

## 2024-12-01 NOTE — Progress Notes (Signed)
 PROGRESS NOTE    Dean [name].  FMW:[id number] DOB: 01-06-1946 DOA: 11/29/2024 PCP: Shepard Ade, MD   Brief Narrative:  Dean [name]. is a 78 y.o. male with medical history significant of AAA, osteoarthritis of the hip, DDD of the cervical and lumbar spine, CAD, history of coronary stent placement, stage III CKD, mixed type COPD, diverticulosis, chronic dyspnea, lower extremity edema, GERD/hiatal hernia, colon polyp, nephrolithiasis, history of lower GI bleed, history of melanoma, history of prediabetes, history of tuberculosis, hyperlipidemia, hypertension, OSA on CPAP, PAD, parotid mass PVCs, renal artery stenosis who presented to the emergency department complaints of generalized weakness and dyspnea. Found to have Bilateral DVTs.  Placed on anticoagulation and is on extremely high amount of oxygen .  Oxygen  is slowly weaning  Assessment and Plan:  Pulmonary embolism (HCC)  Associated with Elevated brain natriuretic peptide (BNP) level And  Elevated troponin and Bilateral LE DVTs: No heart strain.  RV to LV ratio 0.9. Stepdown/inpatient.  -Supplemental oxygen  as needed and was worsening. Had to be put on 12 L HFNC + NRB Continue heparin  infusion. Hydromorphone  as needed for pain. Obtained Echocardiograma and showed EF of 60-65% G1DD and Normal RVSF. Obtained lower extremity Doppler aand had Acute DVT involving the right  posterior tibial veins, right peroneal veins, and right femoral vein w/ Cystic structure is found in the popliteal fossa and on there was DVT involving the left femoral vein, left posterior tibial veins, and left peroneal veins. CTA PE repeated and showed Stable bilateral central pulmonary emboli, left greater than right, extending to the segmental and subsegmental levels of the left lower, left upper, right middle, and right lower lobes. Interval development of bubbly left lower lobe consolidation, likely representing developing pulmonary infarction. No right  heart strain. -Pro-BNP went from 9,331.0 -> 7,251.0 and Troponin T went from 132 -> 146 -PCCM and IR consulted. Received a Dose of IV Lasix  20 mg x1. IR recommends no thrombectomy and recommends A/C alone but recommending notifying IR if has Clinical Sx of Worsening PE burden or Hemodynamic Changes; continuing heparin  drip at this time -Pulm recommends to continue ICS/LAMA/LAMA and hypertonic saline nebs 4 times daily as well as DuoNebs 4 times daily and aggressive PT and wean O2 as tolerated. SpO2: 97 % O2 Flow Rate (L/min): 50 L/min FiO2 (%): 80 % - He is now on 50 L and 80% FiO2 heated high flow nasal cannula - Palliative care consulted for goals of care discussion and patient is now DNR/DNI with limited interventions and allowing for comfort feeds.  After discussion with the family there is going to be a time-limited trial of current interventions for next 2 to 3 days and consideration of SNF rehabilitation with palliative but he declines further than recommendation will be for residential hospice facility and comfort care  Hypokalemia: K+ Trend:  Recent Labs  Lab 11/29/24 0701 11/30/24 0249 12/01/24 0304  K 2.9* 2.9* 3.5  -Replete. CTM and Replete as Necessary. Repeat CMP in the AM   Hypoglycemia: In the setting of being NPO. Start IVF w/ D5 NS + 40 mEQ KCL @ 75 mL/hr x 12 hours and will renew  Tobacco Abuse: May use NicoDerm or Nicorette gum.  AKI on CKD Stage 3a:  Recent Labs  Lab 11/29/24 0701 11/30/24 0249 12/01/24 0304  BUN 15 16 18   CREATININE 1.74* 1.53* 1.85*  -IVF as above -Avoid Nephrotoxic Medications, Contrast Dyes, Hypotension and Dehydration to Ensure Adequate Renal Perfusion and will need to Renally  Adjust Meds -Continue to Monitor and Trend Renal Function carefully and repeat CMP in the AM   Obstructive sleep apnea: Not on CPAP.   Essential hypertension: On amlodipine  10 mg p.o. daily and On Avapro  150 mg p.o. daily Will resume once cleared for oral intake.  CTM BP per Protocol. Last BP reading was 158/61   Dysphagia: He was kept n.p.o. and SLP evaluated and recommended keeping n.p.o. except meds; may allow comfort feeds   Coronary atherosclerosis: Has been on aspirin , amlodipine , Imdur  and statin.   COPD mixed type [facility]): Pulmonary recommending Budesonide , Arfomoterol, and Revefenacin  along with DuoNeb QID and Hypertonic Saline Nebs.   GERD/GI Prophylaxis: Antiacid, H2 blocker or PPI as needed.   PAD (peripheral artery disease): Discontinue aspirin  while on AC. Continue statin if cleared by SLP    Hyperlipidemia / Aortic atherosclerosis: On rosuvastatin.   Pressure injury of skin: Continue local care and preventive measures.  Wound 11/29/24 1431 Pressure Injury Heel Right Stage 1 -  Intact skin with non-blanchable redness of a localized area usually over a bony prominence. (Active)     Wound 11/29/24 1432 Pressure Injury Buttocks Medial Stage 1 -  Intact skin with non-blanchable redness of a localized area usually over a bony prominence. (Active)   Hypoalbuminemia: Patient's Albumin Trend Recent Labs  Lab 11/29/24 0701 11/30/24 0249 12/01/24 0304  ALBUMIN 3.5 3.3* 3.2*  -Continue to Monitor and Trend and repeat CMP in the AM   DVT prophylaxis: Anticoagulated with Heparin  gtt    Code Status: Limited: Do not attempt resuscitation (DNR) -DNR-LIMITED -Do Not Intubate/DNI  Family Communication: No family present @ bedside  Disposition Plan:  Level of care: Stepdown Status is: Inpatient Remains inpatient appropriate because: Needs further clinical improvement   Consultants:  IR Palliative Care PCCM  Procedures:  As delineated as above  Antimicrobials:  Anti-infectives (From admission, onward)    None       Subjective: And examined at bedside and his breathing is doing a little bit better today and his oxygen  is slowly being.  Now only on 50 L 80% FiO2.  No nausea or vomiting.  Denies any lightheadedness or  dizziness.  Objective: Vitals:   12/01/24 1934 12/01/24 1935 12/01/24 1937 12/01/24 2022  BP:      Pulse:      Resp:      Temp:    98.7 F (37.1 C)  TempSrc:    Oral  SpO2: 95% 96% 97%   Weight:      Height:        Intake/Output Summary (Last 24 hours) at 12/01/2024 2023 Last data filed at 12/01/2024 1920 Gross per 24 hour  Intake 1641.05 ml  Output 610 ml  Net 1031.05 ml   Filed Weights   11/29/24 1314  Weight: 67.4 kg   Examination: Physical Exam:  Constitutional: Chronically ill-appearing elderly Caucasian male in no acute distress Respiratory: Diminished to auscultation bilaterally, no wheezing, rales, rhonchi or crackles. Normal respiratory effort and patient is not tachypenic. No accessory muscle use.  Wearing supplemental O2 via HHFNC Cardiovascular: RRR, no murmurs / rubs / gallops. S1 and S2 auscultated. Mild LE edema Abdomen: Soft, non-tender, Non-distended. Bowel sounds positive.  GU: Deferred. Musculoskeletal: No clubbing / cyanosis of digits/nails. No joint deformity upper and lower extremities.  Skin: No rashes, lesions, ulcers on a limited skin evaluation. No induration; Warm and dry.  Neurologic: CN 2-12 grossly intact with no focal deficits. Romberg sign and cerebellar reflexes not assessed.  Psychiatric:  Normal judgment and insight. Alert and oriented x 3. Normal mood and appropriate affect.   Data Reviewed: I have personally reviewed following labs and imaging studies  CBC: Recent Labs  Lab 11/29/24 0701 11/30/24 0249 12/01/24 0304  WBC 8.6 8.9 9.6  NEUTROABS 6.5  --  7.6  HGB 14.5 13.6 13.7  HCT 44.6 42.4 43.5  MCV 97.2 96.1 97.8  PLT 202 187 212   Basic Metabolic Panel: Recent Labs  Lab 11/29/24 0701 11/29/24 0925 11/30/24 0249 11/30/24 0810 12/01/24 0304  NA 146*  --  143  --  143  K 2.9*  --  2.9*  --  3.5  CL 101  --  101  --  103  CO2 29  --  28  --  27  GLUCOSE 72  --  69*  --  111*  BUN 15  --  16  --  18  CREATININE  1.74*  --  1.53*  --  1.85*  CALCIUM  8.9  --  8.3*  --  8.5*  MG  --  1.8  --  2.3 2.3  PHOS  --  3.4  --  3.5 3.7   GFR: Estimated Creatinine Clearance: 29.1 mL/min (A) (by C-G formula based on SCr of 1.85 mg/dL (H)). Liver Function Tests: Recent Labs  Lab 11/29/24 0701 11/30/24 0249 12/01/24 0304  AST 30 26 23   ALT 7 7 6   ALKPHOS 118 109 108  BILITOT 0.6 0.6 0.6  PROT 7.2 6.8 6.7  ALBUMIN 3.5 3.3* 3.2*   No results for input(s): LIPASE, AMYLASE in the last 168 hours. No results for input(s): AMMONIA in the last 168 hours. Coagulation Profile: No results for input(s): INR, PROTIME in the last 168 hours. Cardiac Enzymes: No results for input(s): CKTOTAL, CKMB, CKMBINDEX, TROPONINI in the last 168 hours. BNP (last 3 results) Recent Labs    11/29/24 0701 11/30/24 0810  PROBNP 9,331.0* 7,251.0*   HbA1C: No results for input(s): HGBA1C in the last 72 hours. CBG: Recent Labs  Lab 12/01/24 0309 12/01/24 0727 12/01/24 1119 12/01/24 1558 12/01/24 1946  GLUCAP 109* 125* 110* 97 94   Lipid Profile: No results for input(s): CHOL, HDL, LDLCALC, TRIG, CHOLHDL, LDLDIRECT in the last 72 hours. Thyroid Function Tests: No results for input(s): TSH, T4TOTAL, FREET4, T3FREE, THYROIDAB in the last 72 hours. Anemia Panel: No results for input(s): VITAMINB12, FOLATE, FERRITIN, TIBC, IRON, RETICCTPCT in the last 72 hours. Sepsis Labs: No results for input(s): PROCALCITON, LATICACIDVEN in the last 168 hours.  Recent Results (from the past 240 hours)  Resp panel by RT-PCR (RSV, Flu A&B, Covid) Anterior Nasal Swab     Status: None   Collection Time: 11/29/24  6:53 AM   Specimen: Anterior Nasal Swab  Result Value Ref Range Status   SARS Coronavirus 2 by RT PCR NEGATIVE NEGATIVE Final    Comment: (NOTE) SARS-CoV-2 target nucleic acids are NOT DETECTED.  The SARS-CoV-2 RNA is generally detectable in upper  respiratory specimens during the acute phase of infection. The lowest concentration of SARS-CoV-2 viral copies this assay can detect is 138 copies/mL. A negative result does not preclude SARS-Cov-2 infection and should not be used as the sole basis for treatment or other patient management decisions. A negative result may occur with  improper specimen collection/handling, submission of specimen other than nasopharyngeal swab, presence of viral mutation(s) within the areas targeted by this assay, and inadequate number of viral copies(<138 copies/mL). A negative result must be combined with clinical  observations, patient history, and epidemiological information. The expected result is Negative.  Fact Sheet for Patients:  bloggercourse.com  Fact Sheet for Healthcare Providers:  seriousbroker.it  This test is no t yet approved or cleared by the [place]  FDA and  has been authorized for detection and/or diagnosis of SARS-CoV-2 by FDA under an Emergency Use Authorization (EUA). This EUA will remain  in effect (meaning this test can be used) for the duration of the COVID-19 declaration under Section 564(b)(1) of the Act, 21 U.S.C.section 360bbb-3(b)(1), unless the authorization is terminated  or revoked sooner.       Influenza A by PCR NEGATIVE NEGATIVE Final   Influenza B by PCR NEGATIVE NEGATIVE Final    Comment: (NOTE) The Xpert Xpress SARS-CoV-2/FLU/RSV plus assay is intended as an aid in the diagnosis of influenza from Nasopharyngeal swab specimens and should not be used as a sole basis for treatment. Nasal washings and aspirates are unacceptable for Xpert Xpress SARS-CoV-2/FLU/RSV testing.  Fact Sheet for Patients: bloggercourse.com  Fact Sheet for Healthcare Providers: seriousbroker.it  This test is not yet approved or cleared by the [place]  FDA and has been  authorized for detection and/or diagnosis of SARS-CoV-2 by FDA under an Emergency Use Authorization (EUA). This EUA will remain in effect (meaning this test can be used) for the duration of the COVID-19 declaration under Section 564(b)(1) of the Act, 21 U.S.C. section 360bbb-3(b)(1), unless the authorization is terminated or revoked.     Resp Syncytial Virus by PCR NEGATIVE NEGATIVE Final    Comment: (NOTE) Fact Sheet for Patients: bloggercourse.com  Fact Sheet for Healthcare Providers: seriousbroker.it  This test is not yet approved or cleared by the [place]  FDA and has been authorized for detection and/or diagnosis of SARS-CoV-2 by FDA under an Emergency Use Authorization (EUA). This EUA will remain in effect (meaning this test can be used) for the duration of the COVID-19 declaration under Section 564(b)(1) of the Act, 21 U.S.C. section 360bbb-3(b)(1), unless the authorization is terminated or revoked.  Performed at [facility], [address], [place], [place] [address]   MRSA Next Gen by PCR, Nasal     Status: None   Collection Time: 11/29/24  1:27 PM   Specimen: Nasal Mucosa; Nasal Swab  Result Value Ref Range Status   MRSA by PCR Next Gen NOT DETECTED NOT DETECTED Final    Comment: (NOTE) The GeneXpert MRSA Assay (FDA approved for NASAL specimens only), is one component of a comprehensive MRSA colonization surveillance program. It is not intended to diagnose MRSA infection nor to guide or monitor treatment for MRSA infections. Test performance is not FDA approved in patients less than 64 years old. Performed at [facility], [address], [place], [place] [address]     Radiology Studies: CT Angio Chest Pulmonary Embolism (PE) W or WO Contrast Result Date: 11/30/2024 EXAM: CTA of the Chest with contrast for PE 11/30/2024 01:22:29 PM TECHNIQUE: CTA of the chest was  performed after the administration of 60 mL of iohexol  (OMNIPAQUE ) 350 MG/ML injection. Multiplanar reformatted images are provided for review. MIP images are provided for review. Automated exposure control, iterative reconstruction, and/or weight based adjustment of the mA/kV was utilized to reduce the radiation dose to as low as reasonably achievable. COMPARISON: None available. CLINICAL HISTORY: Pulmonary embolism (PE) suspected, high prob. FINDINGS: PULMONARY ARTERIES: Pulmonary arteries are adequately opacified for evaluation. Stable bilateral central pulmonary emboli, left greater than right extending to the segmental and subsegmental  levels of the left lower, left upper, right middle, right lower lobes. Main pulmonary artery is normal in caliber. MEDIASTINUM: The heart demonstrates a normal RV to LV ratio. There is 4-vessel coronary artery calcification and severe atherosclerotic plaque. The pericardium demonstrates no acute abnormality. There is no acute abnormality of the thoracic aorta. LYMPH NODES: No mediastinal, hilar or axillary lymphadenopathy. LUNGS AND PLEURA: Interval development of associated bubbly left lower lobe consolidation that likely represents developing pulmonary infarction. No pleural effusion or pneumothorax. UPPER ABDOMEN: Limited images of the upper abdomen demonstrate status post cholecystectomy. Lesions within the left kidney likely represent simple renal cysts. Simple renal cysts do not require additional follow-up unless clinically indicated due to signs/symptoms. SOFT TISSUES AND BONES: No acute bone or soft tissue abnormality. IMPRESSION: 1. Stable bilateral central pulmonary emboli, left greater than right, extending to the segmental and subsegmental levels of the left lower, left upper, right middle, and right lower lobes. 2. Interval development of bubbly left lower lobe consolidation, likely representing developing pulmonary infarction. 3. No right heart strain.  Electronically signed by: Morgane Naveau MD 11/30/2024 01:40 PM EST RP Workstation: [id number]   ECHOCARDIOGRAM COMPLETE Result Date: 11/30/2024    ECHOCARDIOGRAM REPORT   Patient Name:   Dean [name]. Date of Exam: 11/30/2024 Medical Rec #:  [id number]             Height:       65.0 in Accession #:    [id number]            Weight:       148.6 lb Date of Birth:  [birth year]             BSA:          1.743 m Patient Age:    78 years              BP:           183/65 mmHg Patient Gender: M                     HR:           70 bpm. Exam Location:  Inpatient Procedure: 2D Echo, Cardiac Doppler and Color Doppler (Both Spectral and Color            Flow Doppler were utilized during procedure). Indications:    Pulmonary Embolus                 Elevated Troponin  History:        Patient has prior history of Echocardiogram examinations, most                 recent 11/25/2022. CAD, Prior CABG, COPD and PAD, Arrythmias:PVC,                 Signs/Symptoms:Dyspnea; Risk Factors:Hypertension, Dyslipidemia,                 Sleep Apnea and Current Smoker. Abdominal Aortic Aneurysm.  Sonographer:    Logan Shove RDCS Referring Phys: [id number] DAVID MANUEL ORTIZ IMPRESSIONS  1. Left ventricular ejection fraction, by estimation, is 60 to 65%. The left ventricle has normal function. The left ventricle has no regional wall motion abnormalities. There is moderate concentric left ventricular hypertrophy. Left ventricular diastolic parameters are consistent with Grade I diastolic dysfunction (impaired relaxation).  2. Right ventricular systolic function is normal. The right ventricular size is normal.  3. Left atrial size was mildly dilated.  4.  Right atrial size was mildly dilated.  5. The mitral valve is normal in structure. Trivial mitral valve regurgitation. No evidence of mitral stenosis.  6. The aortic valve is tricuspid. Aortic valve regurgitation is not visualized. Aortic valve sclerosis is present, with no evidence of aortic  valve stenosis.  7. The inferior vena cava is normal in size with greater than 50% respiratory variability, suggesting right atrial pressure of 3 mmHg. FINDINGS  Left Ventricle: Left ventricular ejection fraction, by estimation, is 60 to 65%. The left ventricle has normal function. The left ventricle has no regional wall motion abnormalities. The left ventricular internal cavity size was normal in size. There is  moderate concentric left ventricular hypertrophy. Left ventricular diastolic parameters are consistent with Grade I diastolic dysfunction (impaired relaxation). Right Ventricle: The right ventricular size is normal. Right ventricular systolic function is normal. Left Atrium: Left atrial size was mildly dilated. Right Atrium: Right atrial size was mildly dilated. Pericardium: There is no evidence of pericardial effusion. Mitral Valve: The mitral valve is normal in structure. Mild mitral annular calcification. Trivial mitral valve regurgitation. No evidence of mitral valve stenosis. Tricuspid Valve: The tricuspid valve is normal in structure. Tricuspid valve regurgitation is trivial. No evidence of tricuspid stenosis. Aortic Valve: The aortic valve is tricuspid. Aortic valve regurgitation is not visualized. Aortic valve sclerosis is present, with no evidence of aortic valve stenosis. Aortic valve peak gradient measures 7.0 mmHg. Pulmonic Valve: The pulmonic valve was normal in structure. Pulmonic valve regurgitation is not visualized. No evidence of pulmonic stenosis. Aorta: The aortic root is normal in size and structure. Venous: The inferior vena cava is normal in size with greater than 50% respiratory variability, suggesting right atrial pressure of 3 mmHg. IAS/Shunts: No atrial level shunt detected by color flow Doppler.  LEFT VENTRICLE PLAX 2D LVIDd:         4.70 cm      Diastology LVIDs:         2.60 cm      LV e' medial:   2.72 cm/s LV PW:         1.30 cm      LV E/e' medial: 24.7 LV IVS:        1.20  cm LVOT diam:     2.10 cm LVOT Area:     3.46 cm  LV Volumes (MOD) LV vol d, MOD A2C: 144.0 ml LV vol d, MOD A4C: 137.0 ml LV vol s, MOD A2C: 51.5 ml LV vol s, MOD A4C: 48.5 ml LV SV MOD A2C:     92.5 ml LV SV MOD A4C:     137.0 ml LV SV MOD BP:      94.3 ml RIGHT VENTRICLE             IVC RV Basal diam:  3.70 cm     IVC diam: 1.90 cm RV S prime:     16.73 cm/s TAPSE (M-mode): 2.8 cm      PULMONARY VEINS                             Systolic Velocity: 2.61 cm/s LEFT ATRIUM             Index        RIGHT ATRIUM           Index LA diam:        2.70 cm 1.55 cm/m   RA Area:  21.80 cm LA Vol (A2C):   88.3 ml 50.65 ml/m  RA Volume:   76.30 ml  43.76 ml/m LA Vol (A4C):   65.2 ml 37.40 ml/m LA Biplane Vol: 78.2 ml 44.85 ml/m  AORTIC VALVE AV Area (Vmax): 3.05 cm AV Vmax:        132.67 cm/s AV Peak Grad:   7.0 mmHg LVOT Vmax:      117.00 cm/s  AORTA Ao Root diam: 3.20 cm Ao Asc diam:  3.70 cm MITRAL VALVE MV Area (PHT): 3.43 cm     SHUNTS MV Decel Time: 221 msec     Systemic Diam: 2.10 cm MV E velocity: 67.10 cm/s MV A velocity: 104.00 cm/s MV E/A ratio:  0.65 Redell Shallow MD Electronically signed by Redell Shallow MD Signature Date/Time: 11/30/2024/11:25:03 AM    Final    DG CHEST PORT 1 VIEW Result Date: 11/30/2024 EXAM: 1 VIEW(S) XRAY OF THE CHEST 11/30/2024 10:02:00 AM COMPARISON: 11/29/2024 CLINICAL HISTORY: 77 year old male with acute pulmonary emboli. Hypoxia FINDINGS: LUNGS AND PLEURA: Emphysema. Increasing left basilar opacity. Possible small left pleural effusion. No pneumothorax. HEART AND MEDIASTINUM: Atherosclerotic calcifications. No acute abnormality of the cardiac and mediastinal silhouettes. BONES AND SOFT TISSUES: No acute osseous abnormality. IMPRESSION: 1. Increasing left basilar opacity, suspicious for infarction in this clinical setting of acute pulmonary emboli. 2. Possible small left pleural effusion. 3. Emphysema. Electronically signed by: Helayne Hurst MD 11/30/2024 10:47 AM EST RP  Workstation: [id number]   Scheduled Meds:  amLODipine   5 mg Oral Daily   arformoterol   15 mcg Nebulization BID   budesonide  (PULMICORT ) nebulizer solution  0.25 mg Nebulization BID   Chlorhexidine  Gluconate Cloth  6 each Topical Daily   ipratropium-albuterol   3 mL Nebulization Q4H   mouth rinse  15 mL Mouth Rinse 4 times per day   polyethylene glycol  17 g Oral BID   potassium chloride   40 mEq Oral Once   revefenacin   175 mcg Nebulization Daily   senna-docusate  1 tablet Oral BID   sodium chloride  HYPERTONIC  4 mL Nebulization Q4H   Continuous Infusions:  dextrose  5 % and 0.9 % NaCl with KCl 40 mEq/L     heparin  1,000 Units/hr (12/01/24 1920)    LOS: 2 days   Alejandro Marker, DO [facility] Available via Epic secure chat 7am-7pm After these hours, please refer to coverage provider listed on [email or web address] 12/01/2024, 8:23 PM

## 2024-12-01 NOTE — Progress Notes (Signed)
 PHARMACY - ANTICOAGULATION CONSULT NOTE  Pharmacy Consult for IV heparin  Indication: pulmonary embolus  Allergies[1]  Patient Measurements: Height: 5' 5 (165.1 cm) Weight: 67.4 kg (148 lb 9.4 oz) IBW/kg (Calculated) : 61.5 HEPARIN  DW (KG): 67.4  Vital Signs: Temp: 98.8 F (37.1 C) (12/14 1118) Temp Source: Axillary (12/14 1118) BP: 129/62 (12/14 1440) Pulse Rate: 62 (12/14 1440)  Labs: Recent Labs    11/29/24 0701 11/29/24 2239 11/30/24 0249 11/30/24 0802 12/01/24 0304 12/01/24 1356  HGB 14.5  --  13.6  --  13.7  --   HCT 44.6  --  42.4  --  43.5  --   PLT 202  --  187  --  212  --   HEPARINUNFRC  --    < >  --  0.61 0.71* 0.45  CREATININE 1.74*  --  1.53*  --  1.85*  --    < > = values in this interval not displayed.    Estimated Creatinine Clearance: 29.1 mL/min (A) (by C-G formula based on SCr of 1.85 mg/dL (H)).   Medical History: Past Medical History:  Diagnosis Date   AAA (abdominal aortic aneurysm) 2021   followed by dr valerie;   incidental finding infrarenal aaa 3.1 x 2.8 per abd/pelvis ct 02-26-20 epic;   last CT  03-10-2022  3.4cm   Arthritis    hips, back, neck    Benign localized prostatic hyperplasia with lower urinary tract symptoms (LUTS)    urologist--- dr nieves   CAD (coronary artery disease) 1994   cardiologist---  dr mona;   s/p angioplasty and stenting x3 1994;   nuclear stress test 10-15-2020  low risk, no ischemia, nuclear ef 59%   Chronic stable angina 2005   CKD (chronic kidney disease), stage III (HCC)    Complication of anesthesia    awareness during anesthesia;  w/ cholestectomy in 1990s pt stated thinks he stopped breathing   COPD mixed type (HCC)    pulmolonogist--- dr c. young   DDD (degenerative disc disease), cervical    Diverticulosis of colon    Dyspnea    chronic   Edema of both lower extremities    GERD (gastroesophageal reflux disease)    hx of   Hiatal hernia    History of adenomatous polyp of colon     History of kidney stones    History of lower GI bleeding 02/2022   admission in epic;   diverticular bleed , no transfusions   History of melanoma    per pt excision forehead , localized   History of prediabetes    History of tuberculosis 1966   Hyperlipidemia    Hypertension    Neuromuscular disorder (HCC)    nerve issues with back    OSA on CPAP 2000   followed by dr c. young;   sleep study in epic , severe osa;   wears cpap auto set 5 to 20   PAD (peripheral artery disease)    vascular--- dr eliza;   hx bilateral CIA stenting in 1990s;  left occluded, dr gerlean s/p  right to left fem-fem bypass 09/ 2012,  renal artery stenosis and aaa   Parotid mass    followed by dr jesus  (ENT);  per pt stable   Peripheral vascular disease    PVC's (premature ventricular contractions)    Renal artery stenosis    02-14-2020  renal angiogram left > right renal stenosis   S/P primary angioplasty with coronary stent 1994  Medications:  Medications Prior to Admission  Medication Sig Dispense Refill Last Dose/Taking   albuterol  (VENTOLIN  HFA) 108 (90 Base) MCG/ACT inhaler INHALE 1 PUFF BY MOUTH INTO THE LUNGS TWICE DAILY AS NEEDED FOR WHEEZING OR SHORTNESS OF BREATH 8.5 g 5 Past Week   antiseptic oral rinse (BIOTENE) LIQD 15 mLs by Mouth Rinse route daily as needed for dry mouth.    Unknown   aspirin  EC 81 MG tablet Take 81 mg by mouth daily. Swallow whole.   Unknown   Azelastine -Fluticasone  137-50 MCG/ACT SUSP Place 1-2 sprays into both nostrils at bedtime. (Patient not taking: Reported on 11/30/2024) 23 g 3 Not Taking   Cholecalciferol (VITAMIN D-3 PO) Take 1 capsule by mouth every other day.      diltiazem  (DILT-XR) 120 MG 24 hr capsule Take 1 capsule (120 mg total) by mouth daily. (Patient not taking: Reported on 11/30/2024) 90 capsule 2 Not Taking   docusate sodium  (COLACE) 100 MG capsule Take 100 mg by mouth daily.      esomeprazole (NEXIUM) 40 MG capsule Take 40 mg by mouth daily as needed  (acid reflux/indigestion.).      finasteride  (PROSCAR ) 5 MG tablet Take 1 tablet by mouth at bedtime.      furosemide  (LASIX ) 20 MG tablet TAKE 1 TABLET(20 MG) BY MOUTH DAILY AS NEEDED 30 tablet 0    irbesartan  (AVAPRO ) 150 MG tablet Take 150 mg by mouth every evening.      isosorbide  mononitrate (IMDUR ) 60 MG 24 hr tablet Take 60 mg by mouth every evening.      loperamide  (IMODIUM  A-D) 2 MG tablet Take 2 mg by mouth as needed.      MAGNESIUM -OXIDE 400 (240 Mg) MG tablet Take 400 mg by mouth every other day.      Menthol , Topical Analgesic, (BLUE-EMU MAXIMUM STRENGTH EX) Apply 1 application topically 3 (three) times daily as needed (pain.).      Polyethyl Glycol-Propyl Glycol (SYSTANE ULTRA OP) Place 1 drop into both eyes daily as needed (Dry eye).      potassium chloride  (KLOR-CON  M) 10 MEQ tablet Take 1 tablet on the days Lasix  is taken. 30 tablet 2    rosuvastatin (CRESTOR) 10 MG tablet Take 10 mg by mouth daily.      simethicone  (MYLICON) 125 MG chewable tablet Chew 125 mg by mouth every 6 (six) hours as needed for flatulence. Takes gas x      Tiotropium Bromide-Olodaterol (STIOLTO RESPIMAT ) 2.5-2.5 MCG/ACT AERS Inhale 2 puffs into the lungs daily. 4 g 12    traMADol  (ULTRAM ) 50 MG tablet Take 1 tablet by mouth 4 (four) times daily as needed.      Scheduled:   amLODipine   5 mg Oral Daily   arformoterol   15 mcg Nebulization BID   budesonide  (PULMICORT ) nebulizer solution  0.25 mg Nebulization BID   Chlorhexidine  Gluconate Cloth  6 each Topical Daily   ipratropium-albuterol   3 mL Nebulization Q4H   mouth rinse  15 mL Mouth Rinse 4 times per day   potassium chloride   40 mEq Oral Once   revefenacin   175 mcg Nebulization Daily   sodium chloride  HYPERTONIC  4 mL Nebulization Q4H   PRN: acetaminophen  **OR** acetaminophen , dextrose , hydrALAZINE , HYDROmorphone  (DILAUDID ) injection, labetalol , ondansetron  **OR** ondansetron  (ZOFRAN ) IV, mouth rinse  Assessment: 28 yoM with PMH AAA, CAD s/p PCI  in 1994, PAD s/p stenting in 1990s and fem bypass in 2012, renal artery stenosis, CKD3, COPD, Hx LGIB, melanoma, Hx TB, presents 12/12  w/ weakness & dyspnea. CTA chest shows large L > R bilateral PE. Pharmacy consulted to start IV heparin .  Baseline INR, aPTT: not done Prior anticoagulation: none  Significant events:  Today, 12/01/2024: 13:56 Heparin  level = 0.45, therapeutic with heparin  infusing at 1000 units/hr CBC WNL No bleeding per nursing   Goal of Therapy: Heparin  level 0.3-0.7 units/ml Monitor platelets by anticoagulation protocol: Yes  Plan: Continue IV Heparin  at 1000 units/hr IV infusion Confirmatory Heparin  level in 8 hours Daily CBC, daily heparin  level once stable Monitor for signs of bleeding or thrombosis   Thank you for allowing pharmacy to be a part of this patients care.  Eleanor EMERSON Agent, PharmD, BCPS Clinical Pharmacist [place] 12/01/2024 2:57 PM           [1]  Allergies Allergen Reactions   Penicillins     Childhood reaction  REACTION: rapid pulse Did it involve swelling of the face/tongue/throat, SOB, or low BP? Yes Did it involve sudden or severe rash/hives, skin peeling, or any reaction on the inside of your mouth or nose? Unknown Did you need to seek medical attention at a hospital or doctor's office? yes When did it last happen?      childhood  If all above answers are NO, may proceed with cephalosporin use.    Fish Oil     Other reaction(s): Heart attack symptoms   Other     Oily seafood salmon-- chest pain   Penicillin G     Other reaction(s): Unknown   Mold Extract [Trichophyton] Rash

## 2024-12-01 NOTE — Progress Notes (Signed)
 PHARMACY - ANTICOAGULATION CONSULT NOTE  Pharmacy Consult for IV heparin  Indication: pulmonary embolus  Allergies[1]  Patient Measurements: Height: 5' 5 (165.1 cm) Weight: 67.4 kg (148 lb 9.4 oz) IBW/kg (Calculated) : 61.5 HEPARIN  DW (KG): 67.4  Vital Signs: Temp: 98.8 F (37.1 C) (12/14 0309) Temp Source: Oral (12/14 0309) BP: 188/70 (12/13 2306) Pulse Rate: 89 (12/14 0252)  Labs: Recent Labs    11/29/24 0701 11/29/24 2239 11/30/24 0249 11/30/24 0802 12/01/24 0304  HGB 14.5  --  13.6  --  13.7  HCT 44.6  --  42.4  --  43.5  PLT 202  --  187  --  212  HEPARINUNFRC  --  0.66  --  0.61 0.71*  CREATININE 1.74*  --  1.53*  --  1.85*    Estimated Creatinine Clearance: 29.1 mL/min (A) (by C-G formula based on SCr of 1.85 mg/dL (H)).   Medical History: Past Medical History:  Diagnosis Date   AAA (abdominal aortic aneurysm) 2021   followed by dr valerie;   incidental finding infrarenal aaa 3.1 x 2.8 per abd/pelvis ct 02-26-20 epic;   last CT  03-10-2022  3.4cm   Arthritis    hips, back, neck    Benign localized prostatic hyperplasia with lower urinary tract symptoms (LUTS)    urologist--- dr nieves   CAD (coronary artery disease) 1994   cardiologist---  dr mona;   s/p angioplasty and stenting x3 1994;   nuclear stress test 10-15-2020  low risk, no ischemia, nuclear ef 59%   Chronic stable angina 2005   CKD (chronic kidney disease), stage III (HCC)    Complication of anesthesia    awareness during anesthesia;  w/ cholestectomy in 1990s pt stated thinks he stopped breathing   COPD mixed type (HCC)    pulmolonogist--- dr c. young   DDD (degenerative disc disease), cervical    Diverticulosis of colon    Dyspnea    chronic   Edema of both lower extremities    GERD (gastroesophageal reflux disease)    hx of   Hiatal hernia    History of adenomatous polyp of colon    History of kidney stones    History of lower GI bleeding 02/2022   admission in epic;    diverticular bleed , no transfusions   History of melanoma    per pt excision forehead , localized   History of prediabetes    History of tuberculosis 1966   Hyperlipidemia    Hypertension    Neuromuscular disorder (HCC)    nerve issues with back    OSA on CPAP 2000   followed by dr c. young;   sleep study in epic , severe osa;   wears cpap auto set 5 to 20   PAD (peripheral artery disease)    vascular--- dr eliza;   hx bilateral CIA stenting in 1990s;  left occluded, dr gerlean s/p  right to left fem-fem bypass 09/ 2012,  renal artery stenosis and aaa   Parotid mass    followed by dr jesus  (ENT);  per pt stable   Peripheral vascular disease    PVC's (premature ventricular contractions)    Renal artery stenosis    02-14-2020  renal angiogram left > right renal stenosis   S/P primary angioplasty with coronary stent 1994    Medications:  Medications Prior to Admission  Medication Sig Dispense Refill Last Dose/Taking   albuterol  (VENTOLIN  HFA) 108 (90 Base) MCG/ACT inhaler INHALE 1 PUFF BY MOUTH  INTO THE LUNGS TWICE DAILY AS NEEDED FOR WHEEZING OR SHORTNESS OF BREATH 8.5 g 5 Past Week   antiseptic oral rinse (BIOTENE) LIQD 15 mLs by Mouth Rinse route daily as needed for dry mouth.    Unknown   aspirin  EC 81 MG tablet Take 81 mg by mouth daily. Swallow whole.   Unknown   Azelastine -Fluticasone  137-50 MCG/ACT SUSP Place 1-2 sprays into both nostrils at bedtime. (Patient not taking: Reported on 11/30/2024) 23 g 3 Not Taking   Cholecalciferol (VITAMIN D-3 PO) Take 1 capsule by mouth every other day.      diltiazem  (DILT-XR) 120 MG 24 hr capsule Take 1 capsule (120 mg total) by mouth daily. (Patient not taking: Reported on 11/30/2024) 90 capsule 2 Not Taking   docusate sodium  (COLACE) 100 MG capsule Take 100 mg by mouth daily.      esomeprazole (NEXIUM) 40 MG capsule Take 40 mg by mouth daily as needed (acid reflux/indigestion.).      finasteride  (PROSCAR ) 5 MG tablet Take 1 tablet by  mouth at bedtime.      furosemide  (LASIX ) 20 MG tablet TAKE 1 TABLET(20 MG) BY MOUTH DAILY AS NEEDED 30 tablet 0    irbesartan  (AVAPRO ) 150 MG tablet Take 150 mg by mouth every evening.      isosorbide  mononitrate (IMDUR ) 60 MG 24 hr tablet Take 60 mg by mouth every evening.      loperamide  (IMODIUM  A-D) 2 MG tablet Take 2 mg by mouth as needed.      MAGNESIUM -OXIDE 400 (240 Mg) MG tablet Take 400 mg by mouth every other day.      Menthol , Topical Analgesic, (BLUE-EMU MAXIMUM STRENGTH EX) Apply 1 application topically 3 (three) times daily as needed (pain.).      Polyethyl Glycol-Propyl Glycol (SYSTANE ULTRA OP) Place 1 drop into both eyes daily as needed (Dry eye).      potassium chloride  (KLOR-CON  M) 10 MEQ tablet Take 1 tablet on the days Lasix  is taken. 30 tablet 2    rosuvastatin (CRESTOR) 10 MG tablet Take 10 mg by mouth daily.      simethicone  (MYLICON) 125 MG chewable tablet Chew 125 mg by mouth every 6 (six) hours as needed for flatulence. Takes gas x      Tiotropium Bromide-Olodaterol (STIOLTO RESPIMAT ) 2.5-2.5 MCG/ACT AERS Inhale 2 puffs into the lungs daily. 4 g 12    traMADol  (ULTRAM ) 50 MG tablet Take 1 tablet by mouth 4 (four) times daily as needed.      Scheduled:   amLODipine   5 mg Oral Daily   arformoterol   15 mcg Nebulization BID   budesonide  (PULMICORT ) nebulizer solution  0.25 mg Nebulization BID   Chlorhexidine  Gluconate Cloth  6 each Topical Daily   ipratropium-albuterol   3 mL Nebulization Q4H   mouth rinse  15 mL Mouth Rinse 4 times per day   potassium chloride   40 mEq Oral Once   revefenacin   175 mcg Nebulization Daily   sodium chloride  HYPERTONIC  4 mL Nebulization Q4H   PRN: acetaminophen  **OR** acetaminophen , dextrose , hydrALAZINE , HYDROmorphone  (DILAUDID ) injection, labetalol , ondansetron  **OR** ondansetron  (ZOFRAN ) IV, mouth rinse  Assessment: 53 yoM with PMH AAA, CAD s/p PCI in 1994, PAD s/p stenting in 1990s and fem bypass in 2012, renal artery stenosis,  CKD3, COPD, Hx LGIB, melanoma, Hx TB, presents 12/12 w/ weakness & dyspnea. CTA chest shows large L > R bilateral PE. Pharmacy consulted to start IV heparin .  Baseline INR, aPTT: not done Prior anticoagulation: none  Significant events:  Today, 12/01/2024: Heparin  level 0.71 slightly supra-therapeutic CBC WNL No bleeding per nursing   Goal of Therapy: Heparin  level 0.3-0.7 units/ml Monitor platelets by anticoagulation protocol: Yes  Plan: Decrease Heparin  to 1000 units/hr IV infusion Heparin  level in 8 hours Daily CBC, daily heparin  level once stable Monitor for signs of bleeding or thrombosis  Thank you for allowing pharmacy to be a part of this patients care.  Leeroy Mace RPh 12/01/2024, 4:47 AM         [1]  Allergies Allergen Reactions   Penicillins     Childhood reaction  REACTION: rapid pulse Did it involve swelling of the face/tongue/throat, SOB, or low BP? Yes Did it involve sudden or severe rash/hives, skin peeling, or any reaction on the inside of your mouth or nose? Unknown Did you need to seek medical attention at a hospital or doctor's office? yes When did it last happen?      childhood  If all above answers are NO, may proceed with cephalosporin use.    Fish Oil     Other reaction(s): Heart attack symptoms   Other     Oily seafood salmon-- chest pain   Penicillin G     Other reaction(s): Unknown   Mold Extract [Trichophyton] Rash

## 2024-12-01 NOTE — Plan of Care (Signed)
°  Problem: Clinical Measurements: Goal: Respiratory complications will improve Outcome: Not Progressing Goal: Cardiovascular complication will be avoided Outcome: Not Progressing   Problem: Activity: Goal: Risk for activity intolerance will decrease Outcome: Not Progressing   Problem: Nutrition: Goal: Adequate nutrition will be maintained Outcome: Not Progressing   Problem: Pain Managment: Goal: General experience of comfort will improve and/or be controlled Outcome: Not Progressing

## 2024-12-01 NOTE — Progress Notes (Signed)
 SLP Cancellation Note  Patient Details Name: Dean [name]. MRN: [id number] DOB: 1946/08/25   Cancelled treatment:       Reason Eval/Treat Not Completed: Medical issues which prohibited therapy. Per critical care MD note 12/13, CTA chest shows evidence of left LL mucous plugging and patient will need aggressive mucociliary clearance. SLP will defer MBS until patient's status has improved.   Norleen IVAR Blase, MA, CCC-SLP Speech Therapy  12/01/2024, 7:56 AM

## 2024-12-01 NOTE — Progress Notes (Signed)
 NAME:  Dean Hopes., MRN:  [id number], DOB:  05-17-1946, LOS: 2 ADMISSION DATE:  11/29/2024, CONSULTATION DATE:  12/01/2024 REFERRING MD:  Dr. Celinda, CHIEF COMPLAINT:  PE   History of Present Illness:  78 year old male with past medical history as below, which is significant for coronary artery disease, stage III CKD, COPD, OSA on CPAP, renal artery stenosis, AAA, and GI bleed.  Who presented to [facility] emergency department 12/12 with complaints of generalized weakness and dyspnea.  Evaluation in the emergency department included chest x-ray which showed cardiomegaly and chronic interstitial prominence.  CT angiogram of the chest demonstrated large left-sided pulmonary embolism and smaller pulmonary embolism on the right.  RV LV ratio 0.9.  He was admitted to the hospital service on a heparin  infusion.  In the evening hours patient began to have increasing oxygen  requirement and PCCM was asked to evaluate.  Pertinent  Medical History   has a past medical history of AAA (abdominal aortic aneurysm) (2021), Arthritis, Benign localized prostatic hyperplasia with lower urinary tract symptoms (LUTS), CAD (coronary artery disease) (1994), Chronic stable angina (2005), CKD (chronic kidney disease), stage III (HCC), Complication of anesthesia, COPD mixed type (HCC), DDD (degenerative disc disease), cervical, Diverticulosis of colon, Dyspnea, Edema of both lower extremities, GERD (gastroesophageal reflux disease), Hiatal hernia, History of adenomatous polyp of colon, History of kidney stones, History of lower GI bleeding (02/2022), History of melanoma, History of prediabetes, History of tuberculosis (1966), Hyperlipidemia, Hypertension, Neuromuscular disorder (HCC), OSA on CPAP (2000), PAD (peripheral artery disease), Parotid mass, Peripheral vascular disease, PVC's (premature ventricular contractions), Renal artery stenosis, and S/P primary angioplasty with coronary stent (1994).   Significant  Hospital Events: Including procedures, antibiotic start and stop dates in addition to other pertinent events   12/12 admit to [facility] for high risk PE 12/13 increased hypoxia requiring HFNC, evolving pulmonary infarct versus mucous plugging  Interim History / Subjective:  Patient notes improving shortness of breath. Still has really thick sputum that is difficult to expectorate. Per nursing, the sputum is so thick it blocks off the suction tube. HFNC has been weaned down to 80%  Objective    Blood pressure (!) 186/74, pulse 82, temperature 98.8 F (37.1 C), temperature source Oral, resp. rate 13, height 5' 5 (1.651 m), weight 67.4 kg, SpO2 100%.    FiO2 (%):  [60 %-100 %] 100 %   Intake/Output Summary (Last 24 hours) at 12/01/2024 0715 Last data filed at 12/01/2024 9340 Gross per 24 hour  Intake 1553.87 ml  Output 700 ml  Net 853.87 ml   Filed Weights   11/29/24 1314  Weight: 67.4 kg   Examination: General: chronically ill appearing, no distress Eyes: PERRL, no scleral icterus Skin: warm, intact, no rashes Neck: JVD at 8 cm H2O CV: distant heart sounds, 1+ pitting edema bilaterally, chronic venous stasis changes Resp: clear anteriorly, rhonchi left lung posteriorly Neuro: Awake alert oriented to person place time and situation  Resolved problem list   Assessment and Plan   Acute Hypoxic Respiratory Failure - seems to be improving overnight. Reviewed repeat CTA chest, there's a concern for evolving pulmonary infarct versus mucous plugging of left lower lobe and atelectasis. Pulmonary Embolism: significant clot burden on the left, lesser burden on the right. No RV strain on CT. Pro BNP 9331, Trop 132. No lactic sent. PESI class 5, BOVA 4. Very high risk. RV/LV ratio 0.9. POCUS shows normal RV size and function. Formal echocardiogram shows normal RV size and function. -  Continue heparin  drip, likely to need to transition to DOAC prior to discharge - Continue ICS/LAMA/LABA -  Continue hypertonic saline nebs QID - Continue duonebs QID - Continue aggressive chest PT - Wean O2 as tolerated - Holding diuretics as patient appears euvolemic on examination  OSA on CPAP - CPAP QHS  COPD without exacerbation - ICS/LAMA/LABA with Budesonide /Yupelri /Brovana  - Duonebs QID - Hypertonic QID - Chest PT  Labs   CBC: Recent Labs  Lab 11/29/24 0701 11/30/24 0249 12/01/24 0304  WBC 8.6 8.9 9.6  NEUTROABS 6.5  --  7.6  HGB 14.5 13.6 13.7  HCT 44.6 42.4 43.5  MCV 97.2 96.1 97.8  PLT 202 187 212    Basic Metabolic Panel: Recent Labs  Lab 11/29/24 0701 11/29/24 0925 11/30/24 0249 11/30/24 0810 12/01/24 0304  NA 146*  --  143  --  143  K 2.9*  --  2.9*  --  3.5  CL 101  --  101  --  103  CO2 29  --  28  --  27  GLUCOSE 72  --  69*  --  111*  BUN 15  --  16  --  18  CREATININE 1.74*  --  1.53*  --  1.85*  CALCIUM  8.9  --  8.3*  --  8.5*  MG  --  1.8  --  2.3 2.3  PHOS  --  3.4  --  3.5 3.7   GFR: Estimated Creatinine Clearance: 29.1 mL/min (A) (by C-G formula based on SCr of 1.85 mg/dL (H)). Recent Labs  Lab 11/29/24 0701 11/30/24 0249 12/01/24 0304  WBC 8.6 8.9 9.6    Liver Function Tests: Recent Labs  Lab 11/29/24 0701 11/30/24 0249 12/01/24 0304  AST 30 26 23   ALT 7 7 6   ALKPHOS 118 109 108  BILITOT 0.6 0.6 0.6  PROT 7.2 6.8 6.7  ALBUMIN 3.5 3.3* 3.2*   No results for input(s): LIPASE, AMYLASE in the last 168 hours. No results for input(s): AMMONIA in the last 168 hours.  ABG    Component Value Date/Time   TCO2 26 01/03/2023 0653     Coagulation Profile: No results for input(s): INR, PROTIME in the last 168 hours.  Cardiac Enzymes: No results for input(s): CKTOTAL, CKMB, CKMBINDEX, TROPONINI in the last 168 hours.  HbA1C: Hgb A1c MFr Bld  Date/Time Value Ref Range Status  11/05/2019 10:49 AM 6.6 (H) 4.8 - 5.6 % Final    Comment:    (NOTE) Pre diabetes:          5.7%-6.4% Diabetes:               >6.4% Glycemic control for   <7.0% adults with diabetes     CBG: Recent Labs  Lab 11/30/24 1627 11/30/24 1821 11/30/24 2035 11/30/24 2314 12/01/24 0309  GLUCAP 81 78 98 108* 109*    Review of Systems:   Not obtained  Past Medical History:  He,  has a past medical history of AAA (abdominal aortic aneurysm) (2021), Arthritis, Benign localized prostatic hyperplasia with lower urinary tract symptoms (LUTS), CAD (coronary artery disease) (1994), Chronic stable angina (2005), CKD (chronic kidney disease), stage III (HCC), Complication of anesthesia, COPD mixed type (HCC), DDD (degenerative disc disease), cervical, Diverticulosis of colon, Dyspnea, Edema of both lower extremities, GERD (gastroesophageal reflux disease), Hiatal hernia, History of adenomatous polyp of colon, History of kidney stones, History of lower GI bleeding (02/2022), History of melanoma, History of prediabetes, History of tuberculosis (1966), Hyperlipidemia,  Hypertension, Neuromuscular disorder (HCC), OSA on CPAP (2000), PAD (peripheral artery disease), Parotid mass, Peripheral vascular disease, PVC's (premature ventricular contractions), Renal artery stenosis, and S/P primary angioplasty with coronary stent (1994).   Surgical History:   Past Surgical History:  Procedure Laterality Date   APPENDECTOMY  1974   CATARACT EXTRACTION W/ INTRAOCULAR LENS IMPLANT Bilateral 2019   CHOLECYSTECTOMY, LAPAROSCOPIC     1990s   COLONOSCOPY WITH PROPOFOL   04/27/2022   dr abran   CORONARY ANGIOPLASTY WITH STENT PLACEMENT  1994   CYSTOSCOPY WITH INSERTION OF UROLIFT N/A 04/14/2020   Procedure: CYSTOSCOPY WITH INSERTION OF UROLIFT;  Surgeon: Nieves Cough, MD;  Location: [facility];  Service: Urology;  Laterality: N/A;   CYSTOSCOPY WITH STENT PLACEMENT Right 07/07/2020   Procedure: CYSTOSCOPY WITH STENT CHANGE;  Surgeon: Nieves Cough, MD;  Location: [facility];  Service: Urology;  Laterality:  Right;   CYSTOSCOPY WITH URETEROSCOPY AND STENT PLACEMENT Bilateral 01/03/2020   Procedure: CYSTOSCOPY WITH RETROGRADE/ URETEROSCOPY AND RIGHT STENT PLACEMENT BLADDER BIOPSY;  Surgeon: Nieves Cough, MD;  Location[facility];  Service: Urology;  Laterality: Bilateral;  ONLY NEEDS 30 MIN   CYSTOSCOPY WITH URETEROSCOPY AND STENT PLACEMENT Right 04/14/2020   Procedure: CYSTOSCOPY WITH URETEROSCOPY, RETROGRADE  AND STENT REPLACEMENT;  Surgeon: Nieves Cough, MD;  Location: [facility];  Service: Urology;  Laterality: Right;   CYSTOSCOPY/URETEROSCOPY/HOLMIUM LASER/STENT PLACEMENT  05/18/2005   @[facility]  by dr andra OCTAVE BYPASS GRAFT  08/31/2011   @[facility]  by dr gerlean;  right to left   ILIAC ARTERY STENT Bilateral    1990s;   bilateral common iliac artery stenting   LAPAROSCOPY ABDOMEN DIAGNOSTIC  12/03/2008   @[facility]  by dr ebbie;   Lap. lysis adhesions/  open umbilical hernia repair   LUMBAR LAMINECTOMY  09/07/2004   @[facility]  by dr pool   RENAL ANGIOGRAPHY N/A 02/14/2020   Procedure: RENAL ANGIOGRAPHY;  Surgeon: Harvey Carlin BRAVO, MD;  Location: [facility] INVASIVE CV LAB;  Service: Cardiovascular;  Laterality: N/A;  Bilateral   ROBOT ASSISTED PYELOPLASTY Right 11/20/2020   Procedure: XI ROBOTIC RIGHT ASSISTED PYELOPLASTY AND STENT PLACEMENT;  Surgeon: Cam Morene ORN, MD;  Location: [facility];  Service: Urology;  Laterality: Right;   TONSILLECTOMY     child   TOTAL HIP ARTHROPLASTY Right 07/15/2020   Procedure: TOTAL HIP ARTHROPLASTY ANTERIOR APPROACH;  Surgeon: Melodi Lerner, MD;  Location: [facility];  Service: Orthopedics;  Laterality: Right;    UMBILICAL HERNIA REPAIR  2004     Social History:   reports that he has been smoking cigarettes. He has a 76.5 pack-year smoking history. He has never been exposed to tobacco smoke. He has never used smokeless tobacco. He reports that he does not currently use alcohol . He reports that he does not use drugs.    Family History:  His family history includes Breast cancer in his sister; Cancer in his mother and sister; Diabetes in his father; Hyperlipidemia in his brother, mother, and sister; Hypertension in his brother, father, mother, and sister; Kidney cancer in his mother; Other in his father; Prostate cancer in his brother. There is no history of Colon cancer, Colon polyps, Esophageal cancer, Rectal cancer, or Stomach cancer.   Allergies Allergies[1]   Home Medications  Prior to Admission medications  Medication Sig Start Date End Date Taking? Authorizing Provider  albuterol  (VENTOLIN  HFA) 108 (90 Base) MCG/ACT inhaler INHALE 1 PUFF BY MOUTH INTO THE LUNGS TWICE DAILY AS NEEDED  FOR WHEEZING OR SHORTNESS OF BREATH 08/08/24   Neysa Rama D, MD  antiseptic oral rinse (BIOTENE) LIQD 15 mLs by Mouth Rinse route daily as needed for dry mouth.     [provider]  aspirin  EC 81 MG tablet Take 81 mg by mouth daily. Swallow whole.    [provider]  Azelastine -Fluticasone  137-50 MCG/ACT SUSP Place 1-2 sprays into both nostrils at bedtime. 05/10/23   Neysa Rama D, MD  Cholecalciferol (VITAMIN D-3 PO) Take 1 capsule by mouth every other day.    [provider]  diltiazem  (DILT-XR) 120 MG 24 hr capsule Take 1 capsule (120 mg total) by mouth daily. 04/13/23   Hilty, Vinie BROCKS, MD  docusate sodium  (COLACE) 100 MG capsule Take 100 mg by mouth daily.    [provider]  esomeprazole (NEXIUM) 40 MG capsule Take 40 mg by mouth daily as needed (acid reflux/indigestion.). 02/07/19   [provider]  finasteride  (PROSCAR ) 5 MG tablet Take 1 tablet by mouth at bedtime.    [provider]  furosemide  (LASIX ) 20 MG tablet TAKE 1 TABLET(20 MG) BY MOUTH DAILY AS NEEDED 05/28/24   Hilty, Vinie BROCKS, MD  irbesartan  (AVAPRO ) 150 MG tablet Take 150 mg by mouth every evening. 11/11/19   [provider]  isosorbide  mononitrate (IMDUR ) 60 MG 24 hr tablet Take 60 mg  by mouth every evening.    [provider]  loperamide  (IMODIUM  A-D) 2 MG tablet Take 2 mg by mouth as needed.    [provider]  MAGNESIUM -OXIDE 400 (240 Mg) MG tablet Take 400 mg by mouth every other day.    [provider]  Menthol , Topical Analgesic, (BLUE-EMU MAXIMUM STRENGTH EX) Apply 1 application topically 3 (three) times daily as needed (pain.).    [provider]  Polyethyl Glycol-Propyl Glycol (SYSTANE ULTRA OP) Place 1 drop into both eyes daily as needed (Dry eye).    [provider]  potassium chloride  (KLOR-CON  M) 10 MEQ tablet Take 1 tablet on the days Lasix  is taken. 04/13/23   Hilty, Vinie BROCKS, MD  rosuvastatin (CRESTOR) 10 MG tablet Take 10 mg by mouth daily.    [provider]  simethicone  (MYLICON) 125 MG chewable tablet Chew 125 mg by mouth every 6 (six) hours as needed for flatulence. Takes gas x    [provider]  Tiotropium Bromide-Olodaterol (STIOLTO RESPIMAT ) 2.5-2.5 MCG/ACT AERS Inhale 2 puffs into the lungs daily. 06/04/24   Neysa Rama D, MD  traMADol  (ULTRAM ) 50 MG tablet Take 1 tablet by mouth 4 (four) times daily as needed. 03/21/22   [provider]     Thank you for this interesting consult. I have spent 35 minutes evaluating patient, reviewing chart, and discussing plan of care with patient, family, and primary medical team. If you have any questions or concerns please reach out to me via secure chat.  Paula Southerly, MD [facility]                [1]  Allergies Allergen Reactions   Penicillins     Childhood reaction  REACTION: rapid pulse Did it involve swelling of the face/tongue/throat, SOB, or low BP? Yes Did it involve sudden or severe rash/hives, skin peeling, or any reaction on the inside of your mouth or nose? Unknown Did you need to seek medical attention at a hospital or doctor's office? yes When did it last happen?      childhood  If  all above answers are NO, may proceed with cephalosporin use.    Fish Oil     Other reaction(s): Heart attack symptoms   Other     Oily seafood salmon-- chest pain   Penicillin G     Other reaction(s): Unknown   Mold Extract [Trichophyton] Rash

## 2024-12-02 DIAGNOSIS — F17210 Nicotine dependence, cigarettes, uncomplicated: Secondary | ICD-10-CM

## 2024-12-02 DIAGNOSIS — I2699 Other pulmonary embolism without acute cor pulmonale: Secondary | ICD-10-CM | POA: Diagnosis not present

## 2024-12-02 DIAGNOSIS — J189 Pneumonia, unspecified organism: Secondary | ICD-10-CM

## 2024-12-02 DIAGNOSIS — I700 Atherosclerosis of aorta: Secondary | ICD-10-CM | POA: Diagnosis not present

## 2024-12-02 DIAGNOSIS — R7989 Other specified abnormal findings of blood chemistry: Secondary | ICD-10-CM | POA: Diagnosis not present

## 2024-12-02 DIAGNOSIS — G4733 Obstructive sleep apnea (adult) (pediatric): Secondary | ICD-10-CM | POA: Diagnosis not present

## 2024-12-02 LAB — EXPECTORATED SPUTUM ASSESSMENT W GRAM STAIN, RFLX TO RESP C

## 2024-12-02 LAB — CBC WITH DIFFERENTIAL/PLATELET
Abs Immature Granulocytes: 0.08 K/uL — ABNORMAL HIGH (ref 0.00–0.07)
Basophils Absolute: 0 K/uL (ref 0.0–0.1)
Basophils Relative: 0 %
Eosinophils Absolute: 0.1 K/uL (ref 0.0–0.5)
Eosinophils Relative: 1 %
HCT: 38.5 % — ABNORMAL LOW (ref 39.0–52.0)
Hemoglobin: 12.3 g/dL — ABNORMAL LOW (ref 13.0–17.0)
Immature Granulocytes: 1 %
Lymphocytes Relative: 7 %
Lymphs Abs: 0.7 K/uL (ref 0.7–4.0)
MCH: 31.3 pg (ref 26.0–34.0)
MCHC: 31.9 g/dL (ref 30.0–36.0)
MCV: 98 fL (ref 80.0–100.0)
Monocytes Absolute: 1.1 K/uL — ABNORMAL HIGH (ref 0.1–1.0)
Monocytes Relative: 10 %
Neutro Abs: 8.4 K/uL — ABNORMAL HIGH (ref 1.7–7.7)
Neutrophils Relative %: 81 %
Platelets: 186 K/uL (ref 150–400)
RBC: 3.93 MIL/uL — ABNORMAL LOW (ref 4.22–5.81)
RDW: 15.3 % (ref 11.5–15.5)
WBC: 10.4 K/uL (ref 4.0–10.5)
nRBC: 0 % (ref 0.0–0.2)

## 2024-12-02 LAB — COMPREHENSIVE METABOLIC PANEL WITH GFR
ALT: 6 U/L (ref 0–44)
AST: 20 U/L (ref 15–41)
Albumin: 2.9 g/dL — ABNORMAL LOW (ref 3.5–5.0)
Alkaline Phosphatase: 91 U/L (ref 38–126)
Anion gap: 9 (ref 5–15)
BUN: 19 mg/dL (ref 8–23)
CO2: 29 mmol/L (ref 22–32)
Calcium: 8.4 mg/dL — ABNORMAL LOW (ref 8.9–10.3)
Chloride: 106 mmol/L (ref 98–111)
Creatinine, Ser: 1.61 mg/dL — ABNORMAL HIGH (ref 0.61–1.24)
GFR, Estimated: 44 mL/min — ABNORMAL LOW (ref >60)
Glucose, Bld: 99 mg/dL (ref 70–99)
Potassium: 3.7 mmol/L (ref 3.5–5.1)
Sodium: 143 mmol/L (ref 135–145)
Total Bilirubin: 0.6 mg/dL (ref 0.0–1.2)
Total Protein: 6.2 g/dL — ABNORMAL LOW (ref 6.5–8.1)

## 2024-12-02 LAB — GLUCOSE, CAPILLARY
Glucose-Capillary: 102 mg/dL — ABNORMAL HIGH (ref 70–99)
Glucose-Capillary: 112 mg/dL — ABNORMAL HIGH (ref 70–99)
Glucose-Capillary: 126 mg/dL — ABNORMAL HIGH (ref 70–99)
Glucose-Capillary: 118 mg/dL — ABNORMAL HIGH (ref 70–99)
Glucose-Capillary: 112 mg/dL — ABNORMAL HIGH (ref 70–99)
Glucose-Capillary: 90 mg/dL (ref 70–99)
Glucose-Capillary: 78 mg/dL (ref 70–99)

## 2024-12-02 LAB — RESPIRATORY PANEL BY PCR

## 2024-12-02 LAB — PHOSPHORUS: Phosphorus: 2.8 mg/dL (ref 2.5–4.6)

## 2024-12-02 LAB — HEPARIN LEVEL (UNFRACTIONATED): Heparin Unfractionated: 0.3 [IU]/mL (ref 0.30–0.70)

## 2024-12-02 LAB — MAGNESIUM: Magnesium: 2.1 mg/dL (ref 1.7–2.4)

## 2024-12-02 LAB — HOMOCYSTEINE: Homocysteine: 25.7 umol/L — ABNORMAL HIGH (ref 0.0–19.2)

## 2024-12-02 LAB — PROTEIN C, TOTAL: Protein C, Total: 84 % (ref 60–150)

## 2024-12-02 MED ORDER — ALBUTEROL SULFATE (2.5 MG/3ML) 0.083% IN NEBU
2.5000 mg | INHALATION_SOLUTION | Freq: Four times a day (QID) | RESPIRATORY_TRACT | Status: DC
  Administered 2024-12-02 – 2024-12-04 (×8): 2.5 mg via RESPIRATORY_TRACT
  Filled 2024-12-02 (×8): qty 3

## 2024-12-02 MED ORDER — VANCOMYCIN HCL 1250 MG/250ML IV SOLN
1250.0000 mg | Freq: Once | INTRAVENOUS | Status: AC
  Administered 2024-12-02: 12:00:00 1250 mg via INTRAVENOUS
  Filled 2024-12-02: qty 250

## 2024-12-02 MED ORDER — SODIUM CHLORIDE 0.9 % IV SOLN
2.0000 g | Freq: Two times a day (BID) | INTRAVENOUS | Status: DC
  Administered 2024-12-02 – 2024-12-03 (×4): 2 g via INTRAVENOUS
  Filled 2024-12-02 (×4): qty 12.5

## 2024-12-02 MED ORDER — VANCOMYCIN HCL 750 MG/150ML IV SOLN
750.0000 mg | INTRAVENOUS | Status: DC
  Filled 2024-12-02: qty 150

## 2024-12-02 NOTE — Progress Notes (Signed)
 Daily Progress Note   Patient Name: Dean [name].       Date: 12/02/2024 DOB: 06/22/1946  Age: 78 y.o. MRN#: [id number] Attending Physician: Sherrill Alejandro Donovan, DO Primary Care Physician: Shepard Ade, MD Admit Date: 11/29/2024  Reason for Consultation/Follow-up: Establishing goals of care  Subjective: Awake alert, brother at bedside, continues on HHFNC, appears weak and with generalized weakness.   Length of Stay: 3  Current Medications: Scheduled Meds:   amLODipine   5 mg Oral Daily   arformoterol   15 mcg Nebulization BID   budesonide  (PULMICORT ) nebulizer solution  0.25 mg Nebulization BID   Chlorhexidine  Gluconate Cloth  6 each Topical Daily   ipratropium-albuterol   3 mL Nebulization Q4H   mouth rinse  15 mL Mouth Rinse 4 times per day   polyethylene glycol  17 g Oral BID   potassium chloride   40 mEq Oral Once   revefenacin   175 mcg Nebulization Daily   senna-docusate  1 tablet Oral BID   sodium chloride  HYPERTONIC  4 mL Nebulization Q4H    Continuous Infusions:  heparin  1,100 Units/hr (12/02/24 0807)    PRN Meds: acetaminophen  **OR** acetaminophen , dextrose , hydrALAZINE , HYDROmorphone  (DILAUDID ) injection, labetalol , ondansetron  **OR** ondansetron  (ZOFRAN ) IV, mouth rinse  Physical Exam         Chronically ill appearing, on HHFNC Monitor noted Not able to speak in full sentences due to sensation of dyspnea.  No edema  Vital Signs: BP (!) 167/59   Pulse 73   Temp 99.9 F (37.7 C) (Axillary)   Resp 17   Ht 5' 5 (1.651 m)   Wt 67.4 kg   SpO2 95%   BMI 24.73 kg/m  SpO2: SpO2: 95 % O2 Device: O2 Device: Heated High Flow Nasal Cannula O2 Flow Rate: O2 Flow Rate (L/min): 45 L/min  Intake/output summary:  Intake/Output Summary (Last 24 hours) at  12/02/2024 0951 Last data filed at 12/02/2024 0800 Gross per 24 hour  Intake 1216.92 ml  Output 535 ml  Net 681.92 ml   LBM: Last BM Date : 11/29/24 Baseline Weight: Weight: 67.4 kg Most recent weight: Weight: 67.4 kg       Palliative Assessment/Data:      Patient Active Problem List   Diagnosis Date Noted   Palliative care by specialist 12/01/2024   Pulmonary embolism (HCC) 11/29/2024  Avascular necrosis of bone of right hip (HCC) 11/29/2024   Diverticulosis of intestine, part unspecified, without perforation or abscess with bleeding 11/29/2024   Aortic atherosclerosis 11/29/2024   Pressure injury of skin 11/29/2024   Elevated brain natriuretic peptide (BNP) level 11/29/2024   Elevated troponin 11/29/2024   Hx of colonic polyps 03/29/2022   Rectal bleeding    Anemia of chronic disease 03/11/2022   CKD (chronic kidney disease), stage III (HCC) 03/11/2022   GI bleed 03/11/2022   Acute GI bleeding 03/10/2022   Excessive sleepiness 06/28/2021   Ureteropelvic junction (UPJ) obstruction, right 11/20/2020   OA (osteoarthritis) of hip 07/15/2020   S/P right THA 07/15/2020   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   Atherosclerosis of native coronary artery of native heart without angina pectoris 01/30/2018   PAOD (peripheral arterial occlusive disease) 01/30/2018   COPD with chronic bronchitis (HCC) 01/30/2018   Parotid mass 03/26/2017   Status post bypass graft of extremity 02/06/2017   Back pain of thoracolumbar region 03/31/2015   SOB (shortness of breath) 12/25/2014   PVC's (premature ventricular contractions) 12/25/2014   Abnormal EKG 12/25/2014   PAD (peripheral artery disease) 12/25/2014   Dyslipidemia 12/25/2014   Current smoker 12/25/2014   Aftercare following surgery of the circulatory system, NEC 12/23/2013   Screening for depression 11/06/2013   Cough 06/24/2013   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   Blood glucose elevated  11/02/2012   Candidal dermatitis 11/02/2011   Adiposity 07/11/2011   Accumulated fluid in larynx 09/24/2010   Atypical chest pain 09/23/2010   Lumbar canal stenosis 08/05/2009   COPD mixed type (HCC) 04/15/2009   TOBACCO USER 03/10/2009   Allergic rhinitis 03/10/2009   GERD 05/20/2008   ABDOMINAL PAIN-EPIGASTRIC 05/20/2008   Hyperlipidemia 04/11/2008   Essential hypertension 04/11/2008   Coronary atherosclerosis 04/11/2008   PROCTITIS 04/11/2008   Obstructive sleep apnea 03/01/2008   Lung nodule 02/29/2008   RENAL CYST, LEFT 06/15/2007   COLONIC POLYPS, ADENOMATOUS 08/11/2005   Diverticulosis of colon with hemorrhage 08/11/2005   Internal hemorrhoids 06/04/2002    Palliative Care Assessment & Plan   Patient Profile:    Assessment: 78 year old male with past medical history as below, which is significant for coronary artery disease, stage III CKD, COPD, OSA on CPAP, renal artery stenosis, AAA, and GI bleed. Who presented to [facility] emergency department 12/12 with complaints of generalized weakness and dyspnea. Evaluation in the emergency department included chest x-ray which showed cardiomegaly and chronic interstitial prominence. CT angiogram of the chest demonstrated large left-sided pulmonary embolism and smaller pulmonary embolism on the right. RV LV ratio 0.9. He was admitted to the hospital service on a heparin  infusion. PCCM also consulted due to increasing O2 requirement.  PMT for code status and goals of care.    Remains on HHFNC, imaging with evolving pulmonary infarct versus mucous plugging. Patient with shortness of breath and thick sputum, patient expressed to nursing staff overnight that he is dying, patient's brother arrived from [place], [place].   Recommendations/Plan: DNR DNI, MOST form also filled out.  Allow for comfort feeds Time limited trial of current interventions for the next 2-3 days - if improvement - consider SNF rehab with palliative if  decline/further decompensation - then comfort care and residential hospice facility transfer    Code Status:    Code Status Orders  (From admission, onward)           Start     Ordered   12/01/24  1214  Do not attempt resuscitation (DNR)- Limited -Do Not Intubate (DNI)  (Code Status)  Continuous       Question Answer Comment  If pulseless and not breathing No CPR or chest compressions.   In Pre-Arrest Conditions (Patient Is Breathing and Has A Pulse) Do not intubate. Provide all appropriate non-invasive medical interventions. Avoid ICU transfer unless indicated or required.   Consent: Discussion documented in EHR or advanced directives reviewed      12/01/24 1215           Code Status History     Date Active Date Inactive Code Status Order ID Comments User Context   11/29/2024 1157 12/01/2024 1215 Full Code [id number]  Celinda Alm Lot, MD ED   03/10/2022 2239 03/12/2022 2016 Full Code [id number]  Franky Redia SAILOR, MD Inpatient   07/15/2020 1345 07/17/2020 1758 Full Code [id number]  Barbra Rosina JONELLE DEVONNA Inpatient   02/14/2020 1501 02/14/2020 2241 Full Code [id number]  Harvey Carlin BRAVO, MD Inpatient       Prognosis:  Guarded   Discharge Planning: To Be Determined  Care plan was discussed with  patient, PCCM MD, brother Dean [name] present at bedside.   Thank you for allowing the Palliative Medicine Team to assist in the care of this patient. High MDM.      Greater than 50%  of this time was spent counseling and coordinating care related to the above assessment and plan.  Lonia Serve, MD  Please contact Palliative Medicine Team phone at [phone] for questions and concerns.

## 2024-12-02 NOTE — Progress Notes (Signed)
 PROGRESS NOTE    Dean [name].  FMW:[id number] DOB: Apr 22, 1946 DOA: 11/29/2024 PCP: Shepard Ade, MD   Brief Narrative:  Dean [name]. is a 78 y.o. male with medical history significant of AAA, osteoarthritis of the hip, DDD of the cervical and lumbar spine, CAD, history of coronary stent placement, stage III CKD, mixed type COPD, diverticulosis, chronic dyspnea, lower extremity edema, GERD/hiatal hernia, colon polyp, nephrolithiasis, history of lower GI bleed, history of melanoma, history of prediabetes, history of tuberculosis, hyperlipidemia, hypertension, OSA on CPAP, PAD, parotid mass PVCs, renal artery stenosis who presented to the emergency department complaints of generalized weakness and dyspnea. Found to have Bilateral DVTs.  Placed on anticoagulation and is on extremely high amount of oxygen .  Oxygen  is slowly weaning and he is now on 30 L 60% FiO2 and now Pulm is recommending starting Abx.   Assessment and Plan:  Pulmonary embolism (HCC)  Associated with Elevated brain natriuretic peptide (BNP) level And  Elevated troponin and Bilateral LE DVTs: No heart strain.  RV to LV ratio 0.9. Stepdown/inpatient.  -Supplemental oxygen  as needed and was worsening. Had to be put on 12 L HFNC + NRB Continue heparin  infusion. Hydromorphone  as needed for pain. Obtained Echocardiograma and showed EF of 60-65% G1DD and Normal RVSF. Obtained lower extremity Doppler aand had Acute DVT involving the right  posterior tibial veins, right peroneal veins, and right femoral vein w/ Cystic structure is found in the popliteal fossa and on there was DVT involving the left femoral vein, left posterior tibial veins, and left peroneal veins. CTA PE repeated and showed Stable bilateral central pulmonary emboli, left greater than right, extending to the segmental and subsegmental levels of the left lower, left upper, right middle, and right lower lobes. Interval development of bubbly left lower lobe  consolidation, likely representing developing pulmonary infarction. No right heart strain. -Pro-BNP went from 9,331.0 -> 7,251.0 and Troponin T went from 132 -> 146 -PCCM and IR consulted. Received a Dose of IV Lasix  20 mg x1. IR recommends no thrombectomy and recommends A/C alone but recommending notifying IR if has Clinical Sx of Worsening PE burden or Hemodynamic Changes; continuing heparin  drip at this time -Pulm recommends to continue ICS/LAMA/LAMA and hypertonic saline nebs 4 times daily as well as DuoNebs 4 times daily and aggressive PT and wean O2 as tolerated. Today after the pulmonologist reviewed the CT of the chest they feel that the patient has an evolving left lower lobe infiltrate with air bronchograms concerning for pneumonia so they are recommending starting Abx and obtaining a Sputum Cx and RVP. SpO2: 91 % O2 Flow Rate (L/min): (S) 30 L/min FiO2 (%): (S) 60 % -Patient is on Stiolto at home and pulmonary recommends that we may need to transition to Trelegy at discharge -He is now gone from 50 L and 80% FiO2 HHFNC yesterday to 30 L and 60% FiO2 HHFNC - Palliative care consulted for goals of care discussion and patient is now DNR/DNI with limited interventions and allowing for comfort feeds.  After discussion with the family there is going to be a time-limited trial of current interventions for next 2 to 3 days and consideration of SNF rehabilitation with palliative but he declines further than recommendation will be for residential hospice facility and comfort care - PT OT recommending SNF  Hypokalemia: K+ Trend:  Recent Labs  Lab 11/29/24 0701 11/30/24 0249 12/01/24 0304 12/02/24 0302  K 2.9* 2.9* 3.5 3.7  -Replete. CTM and Replete as  Necessary. Repeat CMP in the AM   Hypoglycemia: In the setting of being NPO. Start IVF w/ D5 NS + 40 mEQ KCL @ 75 mL/hr x 12 hours and will renew  Tobacco Abuse: May use NicoDerm or Nicorette gum.  AKI on CKD Stage 3a:  Recent Labs  Lab  11/29/24 0701 11/30/24 0249 12/01/24 0304 12/02/24 0302  BUN 15 16 18 19   CREATININE 1.74* 1.53* 1.85* 1.61*  -IVF as above -Avoid Nephrotoxic Medications, Contrast Dyes, Hypotension and Dehydration to Ensure Adequate Renal Perfusion and will need to Renally Adjust Meds -Continue to Monitor and Trend Renal Function carefully and repeat CMP in the AM   Obstructive sleep apnea: Not on CPAP.   Essential hypertension: On amlodipine  10 mg p.o. daily and On Avapro  150 mg p.o. daily Will resume once cleared for oral intake. CTM BP per Protocol. Last BP reading was 158/61   Dysphagia: He was kept n.p.o. and SLP evaluated and recommended keeping n.p.o. except meds for now; may allow comfort feeds per prior discussion.  SLP to follow-up again and he is on too much oxygen  to do an MBS   Coronary atherosclerosis: Has been on aspirin , amlodipine , Imdur  and statin.   COPD mixed type [facility]): Pulmonary recommending Budesonide , Arfomoterol, and Revefenacin  along with DuoNeb QID and Hypertonic Saline Nebs. See above   GERD/GI Prophylaxis: Antiacid, H2 blocker or PPI as needed.   PAD (peripheral artery disease): Discontinue aspirin  while on AC. Continue statin if cleared by SLP    Hyperlipidemia / Aortic Atherosclerosis: On Rosuvastatin but currently on hold.   Normocytic Anemia: Hgb/Hct Trend:  Recent Labs  Lab 11/29/24 0701 11/30/24 0249 12/01/24 0304 12/02/24 0302  HGB 14.5 13.6 13.7 12.3*  HCT 44.6 42.4 43.5 38.5*  MCV 97.2 96.1 97.8 98.0  -? Dilutional Drop. Check Anemia Panel in the AM. CTM for S/Sx of Bleeding; No overt bleeding noted. -Repeat CBC in the AM    Pressure injury of skin: Continue local care and preventive measures.  Wound 11/29/24 1431 Pressure Injury Heel Right Stage 1 -  Intact skin with non-blanchable redness of a localized area usually over a bony prominence. (Active)     Wound 11/29/24 1432 Pressure Injury Buttocks Medial Stage 1 -  Intact skin with non-blanchable  redness of a localized area usually over a bony prominence. (Active)   Hypoalbuminemia: Patient's Albumin Trend Recent Labs  Lab 11/29/24 0701 11/30/24 0249 12/01/24 0304 12/02/24 0302  ALBUMIN 3.5 3.3* 3.2* 2.9*  -Continue to Monitor and Trend and repeat CMP in the AM   DVT prophylaxis: Anticoagulated with heparin  drip    Code Status: Limited: Do not attempt resuscitation (DNR) -DNR-LIMITED -Do Not Intubate/DNI  Family Communication: Discussed with the patient's brother at bedside  Disposition Plan:  Level of care: Stepdown Status is: Inpatient Remains inpatient appropriate because: Needs further clinical improvement   Consultants:  IR Palliative Care PCCM  Procedures:  As delineated as above  Antimicrobials:  Anti-infectives (From admission, onward)    Start     Dose/Rate Route Frequency Ordered Stop   12/03/24 1100  vancomycin  (VANCOREADY) IVPB 750 mg/150 mL        750 mg 150 mL/hr over 60 Minutes Intravenous Every 24 hours 12/02/24 1001     12/02/24 1100  ceFEPIme  (MAXIPIME ) 2 g in sodium chloride  0.9 % 100 mL IVPB        2 g 200 mL/hr over 30 Minutes Intravenous Every 12 hours 12/02/24 0958     12/02/24 1100  vancomycin  (VANCOREADY) IVPB 1250 mg/250 mL        1,250 mg 166.7 mL/hr over 90 Minutes Intravenous  Once 12/02/24 1001 12/02/24 1347        Subjective: Seen and examined at bedside and was feeling rough.  Oxygen  being weaned.  No nausea or vomiting.  Getting ice chips.  Objective: Vitals:   12/02/24 1730 12/02/24 1747 12/02/24 1759 12/02/24 1800  BP: (!) 182/81  (!) 182/81 (!) 164/70  Pulse: 77 75  81  Resp: 17 19  17   Temp:  99 F (37.2 C)    TempSrc:  Oral    SpO2: (!) 87% 90%  91%  Weight:      Height:        Intake/Output Summary (Last 24 hours) at 12/02/2024 1902 Last data filed at 12/02/2024 0800 Gross per 24 hour  Intake 881.57 ml  Output 275 ml  Net 606.57 ml   Filed Weights   11/29/24 1314  Weight: 67.4 kg    Examination: Physical Exam:  Constitutional: Chronically ill-appearing elderly Caucasian male who appears overall comfortable Respiratory: Diminished to auscultation bilaterally, no wheezing, rales, rhonchi or crackles. Normal respiratory effort and patient is not tachypenic. No accessory muscle use.  Unlabored breathing but wearing heated high flow nasal cannula Cardiovascular: Mildly tachycardic, no murmurs / rubs / gallops. S1 and S2 auscultated.  My 1+extremity edema Abdomen: Soft, non-tender, mildly distended. Bowel sounds positive.  GU: Deferred. Musculoskeletal: No clubbing / cyanosis of digits/nails. No joint deformity upper and lower extremities.  Skin: No rashes, lesions, ulcers on limited skin evaluation. No induration; Warm and dry.  Neurologic: CN 2-12 grossly intact with no focal deficits. Romberg sign and cerebellar reflexes not assessed.  Psychiatric: Normal judgment and insight. Alert and oriented x 3. Normal mood and appropriate affect.   Data Reviewed: I have personally reviewed following labs and imaging studies  CBC: Recent Labs  Lab 11/29/24 0701 11/30/24 0249 12/01/24 0304 12/02/24 0302  WBC 8.6 8.9 9.6 10.4  NEUTROABS 6.5  --  7.6 8.4*  HGB 14.5 13.6 13.7 12.3*  HCT 44.6 42.4 43.5 38.5*  MCV 97.2 96.1 97.8 98.0  PLT 202 187 212 186   Basic Metabolic Panel: Recent Labs  Lab 11/29/24 0701 11/29/24 0925 11/30/24 0249 11/30/24 0810 12/01/24 0304 12/02/24 0302  NA 146*  --  143  --  143 143  K 2.9*  --  2.9*  --  3.5 3.7  CL 101  --  101  --  103 106  CO2 29  --  28  --  27 29  GLUCOSE 72  --  69*  --  111* 99  BUN 15  --  16  --  18 19  CREATININE 1.74*  --  1.53*  --  1.85* 1.61*  CALCIUM  8.9  --  8.3*  --  8.5* 8.4*  MG  --  1.8  --  2.3 2.3 2.1  PHOS  --  3.4  --  3.5 3.7 2.8   GFR: Estimated Creatinine Clearance: 33.4 mL/min (A) (by C-G formula based on SCr of 1.61 mg/dL (H)). Liver Function Tests: Recent Labs  Lab 11/29/24 0701  11/30/24 0249 12/01/24 0304 12/02/24 0302  AST 30 26 23 20   ALT 7 7 6 6   ALKPHOS 118 109 108 91  BILITOT 0.6 0.6 0.6 0.6  PROT 7.2 6.8 6.7 6.2*  ALBUMIN 3.5 3.3* 3.2* 2.9*   No results for input(s): LIPASE, AMYLASE in the last 168 hours.  No results for input(s): AMMONIA in the last 168 hours. Coagulation Profile: No results for input(s): INR, PROTIME in the last 168 hours. Cardiac Enzymes: No results for input(s): CKTOTAL, CKMB, CKMBINDEX, TROPONINI in the last 168 hours. BNP (last 3 results) Recent Labs    11/29/24 0701 11/30/24 0810  PROBNP 9,331.0* 7,251.0*   HbA1C: No results for input(s): HGBA1C in the last 72 hours. CBG: Recent Labs  Lab 12/02/24 0320 12/02/24 0833 12/02/24 1222 12/02/24 1326 12/02/24 1746  GLUCAP 102* 112* 126* 118* 112*   Lipid Profile: No results for input(s): CHOL, HDL, LDLCALC, TRIG, CHOLHDL, LDLDIRECT in the last 72 hours. Thyroid Function Tests: No results for input(s): TSH, T4TOTAL, FREET4, T3FREE, THYROIDAB in the last 72 hours. Anemia Panel: No results for input(s): VITAMINB12, FOLATE, FERRITIN, TIBC, IRON, RETICCTPCT in the last 72 hours. Sepsis Labs: No results for input(s): PROCALCITON, LATICACIDVEN in the last 168 hours.  Recent Results (from the past 240 hours)  Resp panel by RT-PCR (RSV, Flu A&B, Covid) Anterior Nasal Swab     Status: None   Collection Time: 11/29/24  6:53 AM   Specimen: Anterior Nasal Swab  Result Value Ref Range Status   SARS Coronavirus 2 by RT PCR NEGATIVE NEGATIVE Final    Comment: (NOTE) SARS-CoV-2 target nucleic acids are NOT DETECTED.  The SARS-CoV-2 RNA is generally detectable in upper respiratory specimens during the acute phase of infection. The lowest concentration of SARS-CoV-2 viral copies this assay can detect is 138 copies/mL. A negative result does not preclude SARS-Cov-2 infection and should not be used as the sole basis for  treatment or other patient management decisions. A negative result may occur with  improper specimen collection/handling, submission of specimen other than nasopharyngeal swab, presence of viral mutation(s) within the areas targeted by this assay, and inadequate number of viral copies(<138 copies/mL). A negative result must be combined with clinical observations, patient history, and epidemiological information. The expected result is Negative.  Fact Sheet for Patients:  bloggercourse.com  Fact Sheet for Healthcare Providers:  seriousbroker.it  This test is no t yet approved or cleared by the [place]  FDA and  has been authorized for detection and/or diagnosis of SARS-CoV-2 by FDA under an Emergency Use Authorization (EUA). This EUA will remain  in effect (meaning this test can be used) for the duration of the COVID-19 declaration under Section 564(b)(1) of the Act, 21 U.S.C.section 360bbb-3(b)(1), unless the authorization is terminated  or revoked sooner.       Influenza A by PCR NEGATIVE NEGATIVE Final   Influenza B by PCR NEGATIVE NEGATIVE Final    Comment: (NOTE) The Xpert Xpress SARS-CoV-2/FLU/RSV plus assay is intended as an aid in the diagnosis of influenza from Nasopharyngeal swab specimens and should not be used as a sole basis for treatment. Nasal washings and aspirates are unacceptable for Xpert Xpress SARS-CoV-2/FLU/RSV testing.  Fact Sheet for Patients: bloggercourse.com  Fact Sheet for Healthcare Providers: seriousbroker.it  This test is not yet approved or cleared by the [place]  FDA and has been authorized for detection and/or diagnosis of SARS-CoV-2 by FDA under an Emergency Use Authorization (EUA). This EUA will remain in effect (meaning this test can be used) for the duration of the COVID-19 declaration under Section 564(b)(1) of the Act, 21  U.S.C. section 360bbb-3(b)(1), unless the authorization is terminated or revoked.     Resp Syncytial Virus by PCR NEGATIVE NEGATIVE Final    Comment: (NOTE) Fact Sheet for Patients: bloggercourse.com  Fact Sheet for Healthcare  Providers: seriousbroker.it  This test is not yet approved or cleared by the [place]  FDA and has been authorized for detection and/or diagnosis of SARS-CoV-2 by FDA under an Emergency Use Authorization (EUA). This EUA will remain in effect (meaning this test can be used) for the duration of the COVID-19 declaration under Section 564(b)(1) of the Act, 21 U.S.C. section 360bbb-3(b)(1), unless the authorization is terminated or revoked.  Performed at [facility], [address], [place], [place] [address]   MRSA Next Gen by PCR, Nasal     Status: None   Collection Time: 11/29/24  1:27 PM   Specimen: Nasal Mucosa; Nasal Swab  Result Value Ref Range Status   MRSA by PCR Next Gen NOT DETECTED NOT DETECTED Final    Comment: (NOTE) The GeneXpert MRSA Assay (FDA approved for NASAL specimens only), is one component of a comprehensive MRSA colonization surveillance program. It is not intended to diagnose MRSA infection nor to guide or monitor treatment for MRSA infections. Test performance is not FDA approved in patients less than 45 years old. Performed at [facility], [address] [place], [place] [address]     Radiology Studies: No results found.  Scheduled Meds:  albuterol   2.5 mg Nebulization QID   amLODipine   5 mg Oral Daily   arformoterol   15 mcg Nebulization BID   budesonide  (PULMICORT ) nebulizer solution  0.25 mg Nebulization BID   Chlorhexidine  Gluconate Cloth  6 each Topical Daily   mouth rinse  15 mL Mouth Rinse 4 times per day   polyethylene glycol  17 g Oral BID   potassium chloride   40 mEq Oral Once   revefenacin   175 mcg Nebulization Daily    senna-docusate  1 tablet Oral BID   sodium chloride  HYPERTONIC  4 mL Nebulization Q4H   Continuous Infusions:  ceFEPime  (MAXIPIME ) IV Stopped (12/02/24 1205)   heparin  1,100 Units/hr (12/02/24 1219)   [START ON 12/03/2024] vancomycin       LOS: 3 days    Alejandro Marker, DO [facility] Available via Epic secure chat 7am-7pm After these hours, please refer to coverage provider listed on [email or web address] 12/02/2024, 7:02 PM

## 2024-12-02 NOTE — Plan of Care (Signed)

## 2024-12-02 NOTE — Progress Notes (Signed)
 Pharmacy Antibiotic Note  Dean [name]. is a 78 y.o. male admitted on 11/29/2024 with pneumonia.  Pharmacy has been consulted for Vanco, Cefepime  dosing.  ID: PNA - Tmax 99.9, WBC 10.4, Scr 1.61  Vanco 12/15>> Cefepime  12/15>>  12/12: MRSA PCR:-Neg  Plan: Cefepime  2g IV q12 hrs Vancomycin  750 mg IV Q 24 hrs. Goal AUC 400-550. Expected AUC: 480 SCr used: 1.61   Height: 5' 5 (165.1 cm) Weight: 67.4 kg (148 lb 9.4 oz) IBW/kg (Calculated) : 61.5  Temp (24hrs), Avg:98.8 F (37.1 C), Min:98 F (36.7 C), Max:99.9 F (37.7 C)  Recent Labs  Lab 11/29/24 0701 11/30/24 0249 12/01/24 0304 12/02/24 0302  WBC 8.6 8.9 9.6 10.4  CREATININE 1.74* 1.53* 1.85* 1.61*    Estimated Creatinine Clearance: 33.4 mL/min (A) (by C-G formula based on SCr of 1.61 mg/dL (H)).    Allergies[1]  Dean [name], PharmD, BCPS Clinical Staff Pharmacist  Dean [name] 12/02/2024 10:01 AM     [1]  Allergies Allergen Reactions   Penicillins     Childhood reaction  REACTION: rapid pulse Did it involve swelling of the face/tongue/throat, SOB, or low BP? Yes Did it involve sudden or severe rash/hives, skin peeling, or any reaction on the inside of your mouth or nose? Unknown Did you need to seek medical attention at a hospital or doctor's office? yes When did it last happen?      childhood  If all above answers are NO, may proceed with cephalosporin use.    Fish Oil     Other reaction(s): Heart attack symptoms   Other     Oily seafood salmon-- chest pain   Penicillin G     Other reaction(s): Unknown   Mold Extract [Trichophyton] Rash

## 2024-12-02 NOTE — Evaluation (Signed)
 Physical Therapy Evaluation Patient Details Name: Dean [name]. MRN: [id number] DOB: 11-Oct-1946 Today's Date: 12/02/2024  History of Present Illness  78 yo M adm 12/12 sob with CT (+) PE, elevated troponin and L DVT. PMH AAA OA of hip, DDD cervical and lumbar spine, CAD with stent placement, CKDIII, COPD, diverticulosis, chronic dyspnea, BLE edema, GIB, GRED, hital hernia, HLD HTN OSA on cpap, PAD parotid mass ,PIVS rental artery stenosis,   Currently on  HHFNC 40L 70% FIO2  Clinical Impression  The patient  presents with significant weakness, requiring 2 persons for  mobility to sitting. Did not attempt transfers due to weakness and patient on HHF [place]. SPO2 did remain greater   955, BP 182/56. HR 70-80. Patient placed in bed chair position and progressed to mobilizing  to sitting onto bed edge with 2 person assisting.   Patient resided alone with limited mobility per brother.  Patient will benefit from continued inpatient follow up therapy, <3 hours/day       If plan is discharge home, recommend the following: Two people to help with walking and/or transfers;Two people to help with bathing/dressing/bathroom   Can travel by private vehicle        Equipment Recommendations None recommended by PT  Recommendations for Other Services       Functional Status Assessment Patient has had a recent decline in their functional status and demonstrates the ability to make significant improvements in function in a reasonable and predictable amount of time.     Precautions / Restrictions Precautions Precautions: Fall Precaution/Restrictions Comments: watch O2, ice chips only Restrictions Weight Bearing Restrictions Per Provider Order: No      Mobility  Bed Mobility Overal bed mobility: Needs Assistance Bed Mobility: Supine to Sit, Sit to Supine     Supine to sit: Mod assist, +2 for physical assistance, +2 for safety/equipment, HOB elevated, Used rails Sit to supine: Mod assist, +2  for physical assistance, +2 for safety/equipment, HOB elevated, Used rails   General bed mobility comments: bed elevated from bed chair position to  sitting onto  EOB then back to bed chair position with cues, bed pad and +59mod/max.    Transfers                   General transfer comment: NT    Ambulation/Gait                  Stairs            Wheelchair Mobility     Tilt Bed    Modified Rankin (Stroke Patients Only)       Balance   Sitting-balance support: Bilateral upper extremity supported, Feet unsupported   Sitting balance - Comments: HOB on L side for initial support then progressed to sitting, not challenged                                     Pertinent Vitals/Pain Pain Assessment Faces Pain Scale: No hurt    Home Living Family/patient expects to be discharged to:: Private residence Living Arrangements: Alone Available Help at Discharge: Family Type of Home: House Home Access: Stairs to enter Entrance Stairs-Rails: None Entrance Stairs-Number of Steps: 1+1   Home Layout: Bed/bath upstairs;Two level Home Equipment: Agricultural Consultant (2 wheels);Cane - single point;Grab bars - tub/shower;Other (comment) (stair lift) Additional Comments: brother bedside and reports patinet has declined in his level of  mobility and self care in past year, only walking ~ 100 ft a day, uses [employer] for meals    Prior Function Prior Level of Function : Independent/Modified Independent             Mobility Comments: see above as per brother ADLs Comments: had not been showering, has life alert, brother lives in [place], [place]     Extremity/Trunk Assessment   Upper Extremity Assessment Upper Extremity Assessment: Generalized weakness;Right hand dominant;RUE deficits/detail;LUE deficits/detail RUE Coordination: decreased fine motor LUE Coordination: decreased fine motor    Lower Extremity Assessment Lower Extremity Assessment:  Generalized weakness    Cervical / Trunk Assessment Cervical / Trunk Assessment: Other exceptions Cervical / Trunk Exceptions: sits with trunk flexed, and propped  Communication   Communication Communication: No apparent difficulties    Cognition Arousal: Alert Behavior During Therapy: WFL for tasks assessed/performed   PT - Cognitive impairments: Difficult to assess, Orientation   Orientation impairments: Time, Situation                     Following commands: Impaired Following commands impaired: Follows one step commands with increased time     Cueing Cueing Techniques: Verbal cues, Tactile cues     General Comments General comments (skin integrity, edema, etc.): sacral and heel pads covering skin breakdown, nails long, on HHF O2 40L/70% with sats 94% EOB, guarded tolerance this session due to high O2 need    Exercises     Assessment/Plan    PT Assessment Patient needs continued PT services  PT Problem List Decreased strength;Decreased cognition;Decreased activity tolerance;Decreased knowledge of use of DME;Decreased balance;Decreased mobility;Cardiopulmonary status limiting activity       PT Treatment Interventions DME instruction;Functional mobility training;Therapeutic activities;Therapeutic exercise;Patient/family education    PT Goals (Current goals can be found in the Care Plan section)  Acute Rehab PT Goals PT Goal Formulation: Patient unable to participate in goal setting Time For Goal Achievement: 12/16/24 Potential to Achieve Goals: Poor    Frequency Min 2X/week     Co-evaluation   Reason for Co-Treatment: Complexity of the patient's impairments (multi-system involvement);For patient/therapist safety PT goals addressed during session: Mobility/safety with mobility OT goals addressed during session: ADL's and self-care       AM-PAC PT 6 Clicks Mobility  Outcome Measure Help needed turning from your back to your side while in a flat bed  without using bedrails?: A Lot Help needed moving from lying on your back to sitting on the side of a flat bed without using bedrails?: A Lot Help needed moving to and from a bed to a chair (including a wheelchair)?: Total Help needed standing up from a chair using your arms (e.g., wheelchair or bedside chair)?: Total Help needed to walk in hospital room?: Total Help needed climbing 3-5 steps with a railing? : Total 6 Click Score: 8    End of Session Equipment Utilized During Treatment: Oxygen  Activity Tolerance: Patient limited by fatigue Patient left: in bed;with call bell/phone within reach;with bed alarm set;with family/visitor present Nurse Communication: Mobility status PT Visit Diagnosis: Unsteadiness on feet (R26.81);Muscle weakness (generalized) (M62.81)    Time: 1030-1103 PT Time Calculation (min) (ACUTE ONLY): 33 min   Charges:   PT Evaluation $PT Eval Low Complexity: 1 Low   PT General Charges $$ ACUTE PT VISIT: 1 Visit       Darice Potters PT Acute Rehabilitation Services Office [phone]  Darice Potters PT Acute Rehabilitation Services Office [phone]   [place], Darice  Elizabeth 12/02/2024, 1:57 PM

## 2024-12-02 NOTE — Progress Notes (Addendum)
 NAME:  Jensen Kilburg., MRN:  [id number], DOB:  Oct 14, 1946, LOS: 3 ADMISSION DATE:  11/29/2024, CONSULTATION DATE:  12/02/2024 REFERRING MD:  Dr. Celinda, CHIEF COMPLAINT:  PE   History of Present Illness:  78 year old male with past medical history as below, which is significant for coronary artery disease, stage III CKD, COPD, OSA on CPAP, renal artery stenosis, AAA, and GI bleed.  Who presented to [facility] emergency department 12/12 with complaints of generalized weakness and dyspnea.  Evaluation in the emergency department included chest x-ray which showed cardiomegaly and chronic interstitial prominence.  CT angiogram of the chest demonstrated large left-sided pulmonary embolism and smaller pulmonary embolism on the right.  RV LV ratio 0.9.  He was admitted to the hospital service on a heparin  infusion.  In the evening hours patient began to have increasing oxygen  requirement and PCCM was asked to evaluate.  Pertinent  Medical History   has a past medical history of AAA (abdominal aortic aneurysm) (2021), Arthritis, Benign localized prostatic hyperplasia with lower urinary tract symptoms (LUTS), CAD (coronary artery disease) (1994), Chronic stable angina (2005), CKD (chronic kidney disease), stage III (HCC), Complication of anesthesia, COPD mixed type (HCC), DDD (degenerative disc disease), cervical, Diverticulosis of colon, Dyspnea, Edema of both lower extremities, GERD (gastroesophageal reflux disease), Hiatal hernia, History of adenomatous polyp of colon, History of kidney stones, History of lower GI bleeding (02/2022), History of melanoma, History of prediabetes, History of tuberculosis (1966), Hyperlipidemia, Hypertension, Neuromuscular disorder (HCC), OSA on CPAP (2000), PAD (peripheral artery disease), Parotid mass, Peripheral vascular disease, PVC's (premature ventricular contractions), Renal artery stenosis, and S/P primary angioplasty with coronary stent (1994).   Significant  Hospital Events: Including procedures, antibiotic start and stop dates in addition to other pertinent events   12/12 admit to [facility] for high risk PE 12/13 increased hypoxia requiring HFNC, evolving left lower lobe infiltrate 12/15 on high flow with cough and phlegm yellow.  Starting HAP coverage antibiotics.  Interim History / Subjective:  Slow weaning of high flow nasal cannula.  Yellow secretions.  Low-grade fever.  Starting antibiotics.  Objective    Blood pressure (!) 167/59, pulse 73, temperature 99.9 F (37.7 C), temperature source Axillary, resp. rate 17, height 5' 5 (1.651 m), weight 67.4 kg, SpO2 95%.    FiO2 (%):  [75 %-100 %] 75 %   Intake/Output Summary (Last 24 hours) at 12/02/2024 1024 Last data filed at 12/02/2024 0800 Gross per 24 hour  Intake 1141.56 ml  Output 535 ml  Net 606.56 ml   Filed Weights   11/29/24 1314  Weight: 67.4 kg   Examination: General: Elderly male appearing weak. Lungs: clear to auscultation bilaterally.  No rhonchi are noticed which were noticed before. Heart: regular rate rhythm, no murmur appreciated.  Abdomen: non tender, non distended. Normal BS.  Neuro: All oriented x 3.  Moving all 4 extremities but significantly weak.   Resolved problem list   Assessment and Plan   Acute Hypoxic Respiratory Failure  Pulmonary Embolism:  LLL PNA: CT chest reviewed by me with distal left main PA blood clot traversing in the left upper and lower lobe along with right sided segmental and subsegmental blood clot.  There is evolving left lower lobe infiltrate with air bronchograms concerning for pneumonia though this could be secondary to atelectasis versus early infarct. Spirometry 2018: COPD with FEV1 38% predicted.  Significant clot burden on the left, lesser burden on the right. No RV strain on CT. Pro BNP 9331, Trop  132. No lactic sent. PESI class 5, BOVA 4. Very high risk. RV/LV ratio 0.9. POCUS shows normal RV size and function. Formal  echocardiogram shows normal RV size and function. - Continue heparin  drip, likely to need to transition to DOAC prior to discharge - Continue ICS/LAMA/LABA.  On Stiolto at home.  May need to transition to Trelegy on discharge. - Continue hypertonic saline nebs QID - Continue albuterol  4 times daily. - Continue aggressive chest PT - Wean O2 as tolerated.  Actively weaning O2 from 80% to 75%. - Holding diuretics as patient appears euvolemic on examination - Starting Vanco and cefepime .  Sputum culture and RVP.   OSA on CPAP - CPAP QHS  COPD without exacerbation - ICS/LAMA/LABA with Budesonide /Yupelri /Brovana  - See above.  Pulmonary will follow.  Labs   CBC: Recent Labs  Lab 11/29/24 0701 11/30/24 0249 12/01/24 0304 12/02/24 0302  WBC 8.6 8.9 9.6 10.4  NEUTROABS 6.5  --  7.6 8.4*  HGB 14.5 13.6 13.7 12.3*  HCT 44.6 42.4 43.5 38.5*  MCV 97.2 96.1 97.8 98.0  PLT 202 187 212 186    Basic Metabolic Panel: Recent Labs  Lab 11/29/24 0701 11/29/24 0925 11/30/24 0249 11/30/24 0810 12/01/24 0304 12/02/24 0302  NA 146*  --  143  --  143 143  K 2.9*  --  2.9*  --  3.5 3.7  CL 101  --  101  --  103 106  CO2 29  --  28  --  27 29  GLUCOSE 72  --  69*  --  111* 99  BUN 15  --  16  --  18 19  CREATININE 1.74*  --  1.53*  --  1.85* 1.61*  CALCIUM  8.9  --  8.3*  --  8.5* 8.4*  MG  --  1.8  --  2.3 2.3 2.1  PHOS  --  3.4  --  3.5 3.7 2.8   GFR: Estimated Creatinine Clearance: 33.4 mL/min (A) (by C-G formula based on SCr of 1.61 mg/dL (H)). Recent Labs  Lab 11/29/24 0701 11/30/24 0249 12/01/24 0304 12/02/24 0302  WBC 8.6 8.9 9.6 10.4    Liver Function Tests: Recent Labs  Lab 11/29/24 0701 11/30/24 0249 12/01/24 0304 12/02/24 0302  AST 30 26 23 20   ALT 7 7 6 6   ALKPHOS 118 109 108 91  BILITOT 0.6 0.6 0.6 0.6  PROT 7.2 6.8 6.7 6.2*  ALBUMIN 3.5 3.3* 3.2* 2.9*   No results for input(s): LIPASE, AMYLASE in the last 168 hours. No results for input(s):  AMMONIA in the last 168 hours.  ABG    Component Value Date/Time   TCO2 26 01/03/2023 0653     Coagulation Profile: No results for input(s): INR, PROTIME in the last 168 hours.  Cardiac Enzymes: No results for input(s): CKTOTAL, CKMB, CKMBINDEX, TROPONINI in the last 168 hours.  HbA1C: Hgb A1c MFr Bld  Date/Time Value Ref Range Status  11/05/2019 10:49 AM 6.6 (H) 4.8 - 5.6 % Final    Comment:    (NOTE) Pre diabetes:          5.7%-6.4% Diabetes:              >6.4% Glycemic control for   <7.0% adults with diabetes     CBG: Recent Labs  Lab 12/01/24 1558 12/01/24 1946 12/01/24 2300 12/02/24 0320 12/02/24 0833  GLUCAP 97 94 102* 102* 112*    Review of Systems:   Not obtained  Past Medical History:  He,  has a past medical history of AAA (abdominal aortic aneurysm) (2021), Arthritis, Benign localized prostatic hyperplasia with lower urinary tract symptoms (LUTS), CAD (coronary artery disease) (1994), Chronic stable angina (2005), CKD (chronic kidney disease), stage III (HCC), Complication of anesthesia, COPD mixed type (HCC), DDD (degenerative disc disease), cervical, Diverticulosis of colon, Dyspnea, Edema of both lower extremities, GERD (gastroesophageal reflux disease), Hiatal hernia, History of adenomatous polyp of colon, History of kidney stones, History of lower GI bleeding (02/2022), History of melanoma, History of prediabetes, History of tuberculosis (1966), Hyperlipidemia, Hypertension, Neuromuscular disorder (HCC), OSA on CPAP (2000), PAD (peripheral artery disease), Parotid mass, Peripheral vascular disease, PVC's (premature ventricular contractions), Renal artery stenosis, and S/P primary angioplasty with coronary stent (1994).   Surgical History:   Past Surgical History:  Procedure Laterality Date   APPENDECTOMY  1974   CATARACT EXTRACTION W/ INTRAOCULAR LENS IMPLANT Bilateral 2019   CHOLECYSTECTOMY, LAPAROSCOPIC     1990s   COLONOSCOPY WITH  PROPOFOL   04/27/2022   dr abran   CORONARY ANGIOPLASTY WITH STENT PLACEMENT  1994   CYSTOSCOPY WITH INSERTION OF UROLIFT N/A 04/14/2020   Procedure: CYSTOSCOPY WITH INSERTION OF UROLIFT;  Surgeon: Nieves Cough, MD;  Location: [facility];  Service: Urology;  Laterality: N/A;   CYSTOSCOPY WITH STENT PLACEMENT Right 07/07/2020   Procedure: CYSTOSCOPY WITH STENT CHANGE;  Surgeon: Nieves Cough, MD;  Location: [facility];  Service: Urology;  Laterality: Right;   CYSTOSCOPY WITH URETEROSCOPY AND STENT PLACEMENT Bilateral 01/03/2020   Procedure: CYSTOSCOPY WITH RETROGRADE/ URETEROSCOPY AND RIGHT STENT PLACEMENT BLADDER BIOPSY;  Surgeon: Nieves Cough, MD;  Location: [facility];  Service: Urology;  Laterality: Bilateral;  ONLY NEEDS 30 MIN   CYSTOSCOPY WITH URETEROSCOPY AND STENT PLACEMENT Right 04/14/2020   Procedure: CYSTOSCOPY WITH URETEROSCOPY, RETROGRADE  AND STENT REPLACEMENT;  Surgeon: Nieves Cough, MD;  Location: [facility];  Service: Urology;  Laterality: Right;   CYSTOSCOPY/URETEROSCOPY/HOLMIUM LASER/STENT PLACEMENT  05/18/2005   @[facility]  by dr andra OCTAVE BYPASS GRAFT  08/31/2011   @[facility]  by dr gerlean;  right to left   ILIAC ARTERY STENT Bilateral    1990s;   bilateral common iliac artery stenting   LAPAROSCOPY ABDOMEN DIAGNOSTIC  12/03/2008   @[facility]  by dr ebbie;   Lap. lysis adhesions/  open umbilical hernia repair   LUMBAR LAMINECTOMY  09/07/2004   @[facility]  by dr pool   RENAL ANGIOGRAPHY N/A 02/14/2020   Procedure: RENAL ANGIOGRAPHY;  Surgeon: Harvey Carlin BRAVO, MD;  Location: [facility] INVASIVE CV LAB;  Service: Cardiovascular;  Laterality: N/A;  Bilateral   ROBOT ASSISTED PYELOPLASTY Right 11/20/2020   Procedure: XI ROBOTIC RIGHT ASSISTED PYELOPLASTY AND STENT PLACEMENT;  Surgeon: Cam Morene ORN, MD;  Location: [facility];  Service: Urology;  Laterality: Right;   TONSILLECTOMY     child   TOTAL HIP  ARTHROPLASTY Right 07/15/2020   Procedure: TOTAL HIP ARTHROPLASTY ANTERIOR APPROACH;  Surgeon: Melodi Lerner, MD;  Location: [facility];  Service: Orthopedics;  Laterality: Right;    UMBILICAL HERNIA REPAIR  2004     Social History:   reports that he has been smoking cigarettes. He has a 76.5 pack-year smoking history. He has never been exposed to tobacco smoke. He has never used smokeless tobacco. He reports that he does not currently use alcohol . He reports that he does not use drugs.   Family History:  His family history includes Breast cancer in his sister; Cancer in his mother and  sister; Diabetes in his father; Hyperlipidemia in his brother, mother, and sister; Hypertension in his brother, father, mother, and sister; Kidney cancer in his mother; Other in his father; Prostate cancer in his brother. There is no history of Colon cancer, Colon polyps, Esophageal cancer, Rectal cancer, or Stomach cancer.   Allergies Allergies[1]   Home Medications  Prior to Admission medications  Medication Sig Start Date End Date Taking? Authorizing Provider  albuterol  (VENTOLIN  HFA) 108 (90 Base) MCG/ACT inhaler INHALE 1 PUFF BY MOUTH INTO THE LUNGS TWICE DAILY AS NEEDED FOR WHEEZING OR SHORTNESS OF BREATH 08/08/24   Neysa Rama D, MD  antiseptic oral rinse (BIOTENE) LIQD 15 mLs by Mouth Rinse route daily as needed for dry mouth.     [provider]  aspirin  EC 81 MG tablet Take 81 mg by mouth daily. Swallow whole.    [provider]  Azelastine -Fluticasone  137-50 MCG/ACT SUSP Place 1-2 sprays into both nostrils at bedtime. 05/10/23   Neysa Rama D, MD  Cholecalciferol (VITAMIN D-3 PO) Take 1 capsule by mouth every other day.    [provider]  diltiazem  (DILT-XR) 120 MG 24 hr capsule Take 1 capsule (120 mg total) by mouth daily. 04/13/23   Hilty, Vinie BROCKS, MD  docusate sodium  (COLACE) 100 MG capsule Take 100 mg by mouth daily.    [provider]  esomeprazole  (NEXIUM) 40 MG capsule Take 40 mg by mouth daily as needed (acid reflux/indigestion.). 02/07/19   [provider]  finasteride  (PROSCAR ) 5 MG tablet Take 1 tablet by mouth at bedtime.    [provider]  furosemide  (LASIX ) 20 MG tablet TAKE 1 TABLET(20 MG) BY MOUTH DAILY AS NEEDED 05/28/24   Hilty, Vinie BROCKS, MD  irbesartan  (AVAPRO ) 150 MG tablet Take 150 mg by mouth every evening. 11/11/19   [provider]  isosorbide  mononitrate (IMDUR ) 60 MG 24 hr tablet Take 60 mg by mouth every evening.    [provider]  loperamide  (IMODIUM  A-D) 2 MG tablet Take 2 mg by mouth as needed.    [provider]  MAGNESIUM -OXIDE 400 (240 Mg) MG tablet Take 400 mg by mouth every other day.    [provider]  Menthol , Topical Analgesic, (BLUE-EMU MAXIMUM STRENGTH EX) Apply 1 application topically 3 (three) times daily as needed (pain.).    [provider]  Polyethyl Glycol-Propyl Glycol (SYSTANE ULTRA OP) Place 1 drop into both eyes daily as needed (Dry eye).    [provider]  potassium chloride  (KLOR-CON  M) 10 MEQ tablet Take 1 tablet on the days Lasix  is taken. 04/13/23   Hilty, Vinie BROCKS, MD  rosuvastatin (CRESTOR) 10 MG tablet Take 10 mg by mouth daily.    [provider]  simethicone  (MYLICON) 125 MG chewable tablet Chew 125 mg by mouth every 6 (six) hours as needed for flatulence. Takes gas x    [provider]  Tiotropium Bromide-Olodaterol (STIOLTO RESPIMAT ) 2.5-2.5 MCG/ACT AERS Inhale 2 puffs into the lungs daily. 06/04/24   Neysa Rama BIRCH, MD  traMADol  (ULTRAM ) 50 MG tablet Take 1 tablet by mouth 4 (four) times daily as needed. 03/21/22   [provider]     CRITICAL CARE Performed by: Sammi BIRCH Fredericks.     Total critical care time: 31 minutes   Critical care time was exclusive of separately billable procedures and treating other patients.   Critical care was necessary to treat or prevent imminent or  life-threatening deterioration.   Critical care  was time spent personally by me on the following activities: development of treatment plan with patient and/or surrogate as well as nursing, discussions with consultants, evaluation of patient's response to treatment, examination of patient, obtaining history from patient or surrogate, ordering and performing treatments and interventions, ordering and review of laboratory studies, ordering and review of radiographic studies, pulse oximetry, re-evaluation of patient's condition and participation in multidisciplinary rounds.  Sammi JONETTA Fredericks, MD Pulmonary, Critical Care and Sleep Attending.  Pager: [phone]  12/02/2024, 10:32 AM             [1]  Allergies Allergen Reactions   Penicillins     Childhood reaction  REACTION: rapid pulse Did it involve swelling of the face/tongue/throat, SOB, or low BP? Yes Did it involve sudden or severe rash/hives, skin peeling, or any reaction on the inside of your mouth or nose? Unknown Did you need to seek medical attention at a hospital or doctor's office? yes When did it last happen?      childhood  If all above answers are NO, may proceed with cephalosporin use.    Fish Oil     Other reaction(s): Heart attack symptoms   Other     Oily seafood salmon-- chest pain   Penicillin G     Other reaction(s): Unknown   Mold Extract [Trichophyton] Rash

## 2024-12-02 NOTE — Evaluation (Signed)
 Occupational Therapy Evaluation Patient Details Name: Dean [name]. MRN: [id number] DOB: March 19, 1946 Today's Date: 12/02/2024   History of Present Illness   78 yo M adm 12/12 sob with CT (+) PE, elevated troponin and L DVT. PMH AAA OA of hip, DDD cervical and lumbar spine, CAD with stent placement, CKDIII, COPD, diverticulosis, chronic dyspnea, BLE edema, GIB, GRED, hital hernia, HLD HTN OSA on cpap, PAD parotid mass PIVS rental artery stenosis,     HHFNC 40 70% FIO2     Clinical Impressions PTA, patient lives alone at home and was independent for BADL's and household mobility with a stair lift. Brother bedside who reports patient has had a decline in his activity level and had been ordering [employer] for meals but not moving well. Currently, patient presents with deficits outlined below (see OT Problem List for details) most significantly poor activity tolerance currently on supplemental HHF O2, decreased muscle strength and coordination, sitting balance and cognition limiting BADL's and functional mobility. Tolerated ICU room bed in chair position, EOB then back to chair position only this session. Patient will benefit from continued inpatient follow up therapy, <3 hours/day. Patient requires continued Acute care hospital level OT services to progress safety and functional performance and allow for discharge.       If plan is discharge home, recommend the following:   Two people to help with walking and/or transfers;Two people to help with bathing/dressing/bathroom;Assistance with cooking/housework;Assistance with feeding;Direct supervision/assist for medications management;Direct supervision/assist for financial management;Assist for transportation;Help with stairs or ramp for entrance;Supervision due to cognitive status     Functional Status Assessment   Patient has had a recent decline in their functional status and demonstrates the ability to make significant improvements in  function in a reasonable and predictable amount of time.     Equipment Recommendations   BSC/3in1;Tub/shower bench;Wheelchair (measurements OT);Wheelchair cushion (measurements OT);Hospital bed;Hoyer lift      Precautions/Restrictions   Precautions Precautions: Fall Precaution/Restrictions Comments: watch O2, ice chips only Restrictions Weight Bearing Restrictions Per Provider Order: No     Mobility Bed Mobility Overal bed mobility: Needs Assistance Bed Mobility: Supine to Sit, Sit to Supine     Supine to sit: Mod assist, +2 for physical assistance, +2 for safety/equipment, HOB elevated, Used rails Sit to supine: Mod assist, +2 for physical assistance, +2 for safety/equipment, HOB elevated, Used rails   General bed mobility comments: bed elevated from chair to EOB back to chair with cues, bed pad and +2    Transfers Overall transfer level:  (moved to chair position then EOB this session)                        Balance Overall balance assessment: Needs assistance Sitting-balance support: Bilateral upper extremity supported, Feet unsupported Sitting balance-Leahy Scale: Fair Sitting balance - Comments: HOB on L side for initial support then progressed to sitting, not challenged                                   ADL either performed or assessed with clinical judgement   ADL Overall ADL's : Needs assistance/impaired Eating/Feeding: NPO;Moderate assistance;Bed level Eating/Feeding Details (indicate cue type and reason): ice chips only, spoon to mouth with R hand Grooming: Wash/dry hands;Wash/dry face;Moderate assistance;Bed level Grooming Details (indicate cue type and reason): chair position Upper Body Bathing: Total assistance;Bed level;Maximal assistance   Lower Body Bathing: Total  assistance;Bed level   Upper Body Dressing : Total assistance;Bed level   Lower Body Dressing: Total assistance;Bed level       Toileting- Clothing  Manipulation and Hygiene: Total assistance;Bed level       Functional mobility during ADLs:  (tolerated chair position of ICU bed) General ADL Comments: poor activity tolerance     Vision Baseline Vision/History: 1 Wears glasses;0 No visual deficits Ability to See in Adequate Light: 0 Adequate Patient Visual Report: No change from baseline Vision Assessment?: Wears glasses for reading;No apparent visual deficits            Pertinent Vitals/Pain Pain Assessment Pain Assessment: Faces Faces Pain Scale: No hurt     Extremity/Trunk Assessment Upper Extremity Assessment Upper Extremity Assessment: Generalized weakness;Right hand dominant;RUE deficits/detail;LUE deficits/detail RUE Coordination: decreased fine motor LUE Coordination: decreased fine motor   Lower Extremity Assessment Lower Extremity Assessment: Defer to PT evaluation   Cervical / Trunk Assessment Cervical / Trunk Assessment: Normal   Communication Communication Communication: No apparent difficulties   Cognition Arousal: Alert Behavior During Therapy: WFL for tasks assessed/performed Cognition: Cognition impaired   Orientation impairments: Time, Situation Awareness: Intellectual awareness impaired, Online awareness impaired Memory impairment (select all impairments): Short-term memory, Non-declarative long-term memory Attention impairment (select first level of impairment): Sustained attention Executive functioning impairment (select all impairments): Initiation, Problem solving OT - Cognition Comments: unable to state year and details of plof, 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, impaired STM, no impulsivity, decreased overall selective attention                 Following commands: Impaired Following commands impaired: Follows one step commands with increased time     Cueing  General Comments   Cueing Techniques: Verbal cues;Tactile cues  sacral and heel pads covering skin  breakdown, nails long, on HHF O2 40L/70% with sats 94% EOB, guarded tolerance this session due to high O2 need           Home Living Family/patient expects to be discharged to:: Private residence Living Arrangements: Alone Available Help at Discharge: Family Type of Home: House Home Access: Stairs to enter Secretary/administrator of Steps: 1+1 Entrance Stairs-Rails: None Home Layout: Bed/bath upstairs;Two level     Bathroom Shower/Tub: Producer, Television/film/video: Handicapped height Bathroom Accessibility: Yes   Home Equipment: Agricultural Consultant (2 wheels);Cane - single point;Grab bars - tub/shower;Other (comment) (stair lift)   Additional Comments: brother bedside and reports patinet has declined in his level of mobility and self care in past year, only walking ~ 100 ft a day, uses [employer] for meals      Prior Functioning/Environment Prior Level of Function : Independent/Modified Independent             Mobility Comments: see above as per brother ADLs Comments: had not been showering, has life alert, brother lives in [place], [place]    [place] Problem List: Decreased strength;Decreased range of motion;Decreased activity tolerance;Impaired balance (sitting and/or standing);Decreased coordination;Decreased cognition;Decreased safety awareness;Decreased knowledge of use of DME or AE;Decreased knowledge of precautions;Cardiopulmonary status limiting activity;Impaired UE functional use   OT Treatment/Interventions: Self-care/ADL training;Therapeutic exercise;Neuromuscular education;Energy conservation;DME and/or AE instruction;Therapeutic activities;Cognitive remediation/compensation;Patient/family education;Balance training      OT Goals(Current goals can be found in the care plan section)   Acute Rehab OT Goals Patient Stated Goal: family- see what he can do OT Goal Formulation: With patient/family Time For Goal Achievement: 12/16/24 Potential to Achieve Goals: Fair ADL  Goals Pt  Will Perform Eating: with min assist;sitting Pt Will Perform Grooming: with min assist;sitting Pt Will Perform Upper Body Bathing: with min assist;sitting Pt Will Perform Upper Body Dressing: with min assist;sitting Pt Will Transfer to Toilet: with +2 assist;stand pivot transfer;bedside commode;with mod assist Pt/caregiver will Perform Home Exercise Program: Increased strength;Both right and left upper extremity;With written HEP provided;With minimal assist   OT Frequency:  Min 1X/week    Co-evaluation   Reason for Co-Treatment: Complexity of the patient's impairments (multi-system involvement);For patient/therapist safety PT goals addressed during session: Mobility/safety with mobility OT goals addressed during session: ADL's and self-care      AM-PAC OT 6 Clicks Daily Activity     Outcome Measure Help from another person eating meals?: Total (except ice chips) Help from another person taking care of personal grooming?: A Lot Help from another person toileting, which includes using toliet, bedpan, or urinal?: Total Help from another person bathing (including washing, rinsing, drying)?: Total Help from another person to put on and taking off regular upper body clothing?: Total Help from another person to put on and taking off regular lower body clothing?: Total 6 Click Score: 7   End of Session Equipment Utilized During Treatment: Oxygen  Nurse Communication: Mobility status  Activity Tolerance: Patient limited by fatigue Patient left: in bed;with call bell/phone within reach;with bed alarm set;Other (comment) (chair position of ICU bed)  OT Visit Diagnosis: Other abnormalities of gait and mobility (R26.89);Unsteadiness on feet (R26.81);Muscle weakness (generalized) (M62.81);Feeding difficulties (R63.3);Cognitive communication deficit (R41.841)                Time: 8970-8897 OT Time Calculation (min): 33 min Charges:  OT General Charges $OT Visit: 1 Visit OT  Evaluation $OT Eval Low Complexity: 1 Low  Lillieanna Tuohy OT/L Acute Rehabilitation Department  [phone]  12/02/2024, 1:32 PM

## 2024-12-02 NOTE — Progress Notes (Signed)
 PHARMACY - ANTICOAGULATION CONSULT NOTE  Pharmacy Consult for Heparin  Indication: BL DVTs, new bilat (L>R) PE  Allergies[1]  Patient Measurements: Height: 5' 5 (165.1 cm) Weight: 67.4 kg (148 lb 9.4 oz) IBW/kg (Calculated) : 61.5 HEPARIN  DW (KG): 67.4  Vital Signs: Temp: 98.6 F (37 C) (12/15 0356) Temp Source: Oral (12/15 0356) BP: 150/65 (12/15 0613) Pulse Rate: 71 (12/15 0613)  Labs: Recent Labs    11/30/24 0249 11/30/24 0802 12/01/24 0304 12/01/24 1356 12/01/24 2149 12/02/24 0302  HGB 13.6  --  13.7  --   --  12.3*  HCT 42.4  --  43.5  --   --  38.5*  PLT 187  --  212  --   --  186  HEPARINUNFRC  --    < > 0.71* 0.45 0.48 0.30  CREATININE 1.53*  --  1.85*  --   --  1.61*   < > = values in this interval not displayed.    Estimated Creatinine Clearance: 33.4 mL/min (A) (by C-G formula based on SCr of 1.61 mg/dL (H)).   Assessment: AC/Heme: BL DVTs, new bilat (L>R) PE; no RHS - Hep level 0.3 (declining), Hgb 12.3 down slightly. Plts 186 stable  Goal of Therapy:  Heparin  level 0.3-0.7 units/ml Monitor platelets by anticoagulation protocol: Yes   Plan:  Increase IV heparin  to 1100 units/hr with declining heparin  level Daily heparin  level and CBC   Makhi Muzquiz Karoline Marina, PharmD, BCPS Clinical Staff Pharmacist Marina, Kailany Dinunzio Stillinger 12/02/2024,7:37 AM      [1]  Allergies Allergen Reactions   Penicillins     Childhood reaction  REACTION: rapid pulse Did it involve swelling of the face/tongue/throat, SOB, or low BP? Yes Did it involve sudden or severe rash/hives, skin peeling, or any reaction on the inside of your mouth or nose? Unknown Did you need to seek medical attention at a hospital or doctor's office? yes When did it last happen?      childhood  If all above answers are NO, may proceed with cephalosporin use.    Fish Oil     Other reaction(s): Heart attack symptoms   Other     Oily seafood salmon-- chest pain    Penicillin G     Other reaction(s): Unknown   Mold Extract [Trichophyton] Rash

## 2024-12-03 ENCOUNTER — Inpatient Hospital Stay (HOSPITAL_COMMUNITY)

## 2024-12-03 DIAGNOSIS — K219 Gastro-esophageal reflux disease without esophagitis: Secondary | ICD-10-CM

## 2024-12-03 DIAGNOSIS — R7989 Other specified abnormal findings of blood chemistry: Secondary | ICD-10-CM | POA: Diagnosis not present

## 2024-12-03 DIAGNOSIS — I82403 Acute embolism and thrombosis of unspecified deep veins of lower extremity, bilateral: Secondary | ICD-10-CM | POA: Diagnosis not present

## 2024-12-03 DIAGNOSIS — Z7189 Other specified counseling: Secondary | ICD-10-CM

## 2024-12-03 DIAGNOSIS — I2694 Multiple subsegmental pulmonary emboli without acute cor pulmonale: Principal | ICD-10-CM

## 2024-12-03 DIAGNOSIS — I2699 Other pulmonary embolism without acute cor pulmonale: Secondary | ICD-10-CM | POA: Diagnosis not present

## 2024-12-03 DIAGNOSIS — I700 Atherosclerosis of aorta: Secondary | ICD-10-CM | POA: Diagnosis not present

## 2024-12-03 LAB — GLUCOSE, CAPILLARY
Glucose-Capillary: 124 mg/dL — ABNORMAL HIGH (ref 70–99)
Glucose-Capillary: 71 mg/dL (ref 70–99)
Glucose-Capillary: 80 mg/dL (ref 70–99)
Glucose-Capillary: 84 mg/dL (ref 70–99)
Glucose-Capillary: 87 mg/dL (ref 70–99)
Glucose-Capillary: 91 mg/dL (ref 70–99)

## 2024-12-03 LAB — COMPREHENSIVE METABOLIC PANEL WITH GFR
ALT: 8 U/L (ref 0–44)
AST: 22 U/L (ref 15–41)
Albumin: 3 g/dL — ABNORMAL LOW (ref 3.5–5.0)
Alkaline Phosphatase: 94 U/L (ref 38–126)
Anion gap: 10 (ref 5–15)
BUN: 22 mg/dL (ref 8–23)
CO2: 26 mmol/L (ref 22–32)
Calcium: 8.5 mg/dL — ABNORMAL LOW (ref 8.9–10.3)
Chloride: 106 mmol/L (ref 98–111)
Creatinine, Ser: 1.59 mg/dL — ABNORMAL HIGH (ref 0.61–1.24)
GFR, Estimated: 44 mL/min — ABNORMAL LOW (ref >60)
Glucose, Bld: 71 mg/dL (ref 70–99)
Potassium: 4 mmol/L (ref 3.5–5.1)
Sodium: 143 mmol/L (ref 135–145)
Total Bilirubin: 0.5 mg/dL (ref 0.0–1.2)
Total Protein: 6.2 g/dL — ABNORMAL LOW (ref 6.5–8.1)

## 2024-12-03 LAB — PRO BRAIN NATRIURETIC PEPTIDE: Pro Brain Natriuretic Peptide: 4026 pg/mL — ABNORMAL HIGH (ref <300.0)

## 2024-12-03 LAB — HEPARIN LEVEL (UNFRACTIONATED)
Heparin Unfractionated: 0.29 [IU]/mL — ABNORMAL LOW (ref 0.30–0.70)
Heparin Unfractionated: 0.57 [IU]/mL (ref 0.30–0.70)

## 2024-12-03 LAB — CBC WITH DIFFERENTIAL/PLATELET
Abs Immature Granulocytes: 0.11 K/uL — ABNORMAL HIGH (ref 0.00–0.07)
Basophils Absolute: 0.1 K/uL (ref 0.0–0.1)
Basophils Relative: 1 %
Eosinophils Absolute: 0.1 K/uL (ref 0.0–0.5)
Eosinophils Relative: 1 %
HCT: 36.8 % — ABNORMAL LOW (ref 39.0–52.0)
Hemoglobin: 11.7 g/dL — ABNORMAL LOW (ref 13.0–17.0)
Immature Granulocytes: 1 %
Lymphocytes Relative: 9 %
Lymphs Abs: 0.8 K/uL (ref 0.7–4.0)
MCH: 31.5 pg (ref 26.0–34.0)
MCHC: 31.8 g/dL (ref 30.0–36.0)
MCV: 99.2 fL (ref 80.0–100.0)
Monocytes Absolute: 0.8 K/uL (ref 0.1–1.0)
Monocytes Relative: 9 %
Neutro Abs: 7.1 K/uL (ref 1.7–7.7)
Neutrophils Relative %: 79 %
Platelets: 165 K/uL (ref 150–400)
RBC: 3.71 MIL/uL — ABNORMAL LOW (ref 4.22–5.81)
RDW: 15.6 % — ABNORMAL HIGH (ref 11.5–15.5)
WBC: 9 K/uL (ref 4.0–10.5)
nRBC: 0 % (ref 0.0–0.2)

## 2024-12-03 LAB — MAGNESIUM: Magnesium: 2.1 mg/dL (ref 1.7–2.4)

## 2024-12-03 LAB — PHOSPHORUS: Phosphorus: 3.1 mg/dL (ref 2.5–4.6)

## 2024-12-03 MED ORDER — DEXTROSE IN LACTATED RINGERS 5 % IV SOLN: INTRAVENOUS | Status: DC

## 2024-12-03 MED ORDER — ENOXAPARIN SODIUM 80 MG/0.8ML IJ SOSY
70.0000 mg | PREFILLED_SYRINGE | Freq: Two times a day (BID) | INTRAMUSCULAR | Status: DC
  Administered 2024-12-03 – 2024-12-04 (×2): 70 mg via SUBCUTANEOUS
  Filled 2024-12-03 (×2): qty 0.8

## 2024-12-03 MED ORDER — HYDROMORPHONE HCL 1 MG/ML IJ SOLN
0.5000 mg | INTRAMUSCULAR | Status: DC | PRN
  Administered 2024-12-03 – 2024-12-04 (×3): 0.5 mg via INTRAVENOUS
  Filled 2024-12-03 (×3): qty 1

## 2024-12-03 NOTE — Progress Notes (Signed)
 PHARMACY - ANTICOAGULATION CONSULT NOTE  Pharmacy Consult for IV heparin  Indication: pulmonary embolus  Allergies[1]  Patient Measurements: Height: 5' 5 (165.1 cm) Weight: 67.4 kg (148 lb 9.4 oz) IBW/kg (Calculated) : 61.5 HEPARIN  DW (KG): 67.4  Vital Signs: Temp: 98.1 F (36.7 C) (12/16 1200) Temp Source: Oral (12/16 1200) BP: 137/63 (12/16 1200) Pulse Rate: 63 (12/16 1200)  Labs: Recent Labs    12/01/24 0304 12/01/24 1356 12/02/24 0302 12/03/24 0306 12/03/24 1246  HGB 13.7  --  12.3* 11.7*  --   HCT 43.5  --  38.5* 36.8*  --   PLT 212  --  186 165  --   HEPARINUNFRC 0.71*   < > 0.30 0.29* 0.57  CREATININE 1.85*  --  1.61* 1.59*  --    < > = values in this interval not displayed.    Estimated Creatinine Clearance: 33.8 mL/min (A) (by C-G formula based on SCr of 1.59 mg/dL (H)).   Medical History: Past Medical History:  Diagnosis Date   AAA (abdominal aortic aneurysm) 2021   followed by dr valerie;   incidental finding infrarenal aaa 3.1 x 2.8 per abd/pelvis ct 02-26-20 epic;   last CT  03-10-2022  3.4cm   Arthritis    hips, back, neck    Benign localized prostatic hyperplasia with lower urinary tract symptoms (LUTS)    urologist--- dr nieves   CAD (coronary artery disease) 1994   cardiologist---  dr mona;   s/p angioplasty and stenting x3 1994;   nuclear stress test 10-15-2020  low risk, no ischemia, nuclear ef 59%   Chronic stable angina 2005   CKD (chronic kidney disease), stage III (HCC)    Complication of anesthesia    awareness during anesthesia;  w/ cholestectomy in 1990s pt stated thinks he stopped breathing   COPD mixed type (HCC)    pulmolonogist--- dr c. young   DDD (degenerative disc disease), cervical    Diverticulosis of colon    Dyspnea    chronic   Edema of both lower extremities    GERD (gastroesophageal reflux disease)    hx of   Hiatal hernia    History of adenomatous polyp of colon    History of kidney stones    History of  lower GI bleeding 02/2022   admission in epic;   diverticular bleed , no transfusions   History of melanoma    per pt excision forehead , localized   History of prediabetes    History of tuberculosis 1966   Hyperlipidemia    Hypertension    Neuromuscular disorder (HCC)    nerve issues with back    OSA on CPAP 2000   followed by dr c. young;   sleep study in epic , severe osa;   wears cpap auto set 5 to 20   PAD (peripheral artery disease)    vascular--- dr eliza;   hx bilateral CIA stenting in 1990s;  left occluded, dr gerlean s/p  right to left fem-fem bypass 09/ 2012,  renal artery stenosis and aaa   Parotid mass    followed by dr jesus  (ENT);  per pt stable   Peripheral vascular disease    PVC's (premature ventricular contractions)    Renal artery stenosis    02-14-2020  renal angiogram left > right renal stenosis   S/P primary angioplasty with coronary stent 1994    Medications:  Medications Prior to Admission  Medication Sig Dispense Refill Last Dose/Taking   albuterol  (VENTOLIN  HFA)  108 (90 Base) MCG/ACT inhaler INHALE 1 PUFF BY MOUTH INTO THE LUNGS TWICE DAILY AS NEEDED FOR WHEEZING OR SHORTNESS OF BREATH 8.5 g 5 Past Week   antiseptic oral rinse (BIOTENE) LIQD 15 mLs by Mouth Rinse route daily as needed for dry mouth.    Unknown   aspirin  EC 81 MG tablet Take 81 mg by mouth daily. Swallow whole.   Unknown   Azelastine -Fluticasone  137-50 MCG/ACT SUSP Place 1-2 sprays into both nostrils at bedtime. (Patient not taking: Reported on 11/30/2024) 23 g 3 Not Taking   Cholecalciferol (VITAMIN D-3 PO) Take 1 capsule by mouth every other day.      diltiazem  (DILT-XR) 120 MG 24 hr capsule Take 1 capsule (120 mg total) by mouth daily. (Patient not taking: Reported on 11/30/2024) 90 capsule 2 Not Taking   docusate sodium  (COLACE) 100 MG capsule Take 100 mg by mouth daily.      esomeprazole (NEXIUM) 40 MG capsule Take 40 mg by mouth daily as needed (acid reflux/indigestion.).       finasteride  (PROSCAR ) 5 MG tablet Take 1 tablet by mouth at bedtime.      furosemide  (LASIX ) 20 MG tablet TAKE 1 TABLET(20 MG) BY MOUTH DAILY AS NEEDED 30 tablet 0    irbesartan  (AVAPRO ) 150 MG tablet Take 150 mg by mouth every evening.      isosorbide  mononitrate (IMDUR ) 60 MG 24 hr tablet Take 60 mg by mouth every evening.      loperamide  (IMODIUM  A-D) 2 MG tablet Take 2 mg by mouth as needed.      MAGNESIUM -OXIDE 400 (240 Mg) MG tablet Take 400 mg by mouth every other day.      Menthol , Topical Analgesic, (BLUE-EMU MAXIMUM STRENGTH EX) Apply 1 application topically 3 (three) times daily as needed (pain.).      Polyethyl Glycol-Propyl Glycol (SYSTANE ULTRA OP) Place 1 drop into both eyes daily as needed (Dry eye).      potassium chloride  (KLOR-CON  M) 10 MEQ tablet Take 1 tablet on the days Lasix  is taken. 30 tablet 2    rosuvastatin (CRESTOR) 10 MG tablet Take 10 mg by mouth daily.      simethicone  (MYLICON) 125 MG chewable tablet Chew 125 mg by mouth every 6 (six) hours as needed for flatulence. Takes gas x      Tiotropium Bromide-Olodaterol (STIOLTO RESPIMAT ) 2.5-2.5 MCG/ACT AERS Inhale 2 puffs into the lungs daily. 4 g 12    traMADol  (ULTRAM ) 50 MG tablet Take 1 tablet by mouth 4 (four) times daily as needed.      Scheduled:   albuterol   2.5 mg Nebulization QID   amLODipine   5 mg Oral Daily   arformoterol   15 mcg Nebulization BID   budesonide  (PULMICORT ) nebulizer solution  0.25 mg Nebulization BID   Chlorhexidine  Gluconate Cloth  6 each Topical Daily   mouth rinse  15 mL Mouth Rinse 4 times per day   polyethylene glycol  17 g Oral BID   potassium chloride   40 mEq Oral Once   revefenacin   175 mcg Nebulization Daily   senna-docusate  1 tablet Oral BID   sodium chloride  HYPERTONIC  4 mL Nebulization Q4H   PRN: acetaminophen  **OR** acetaminophen , dextrose , hydrALAZINE , labetalol , ondansetron  **OR** ondansetron  (ZOFRAN ) IV, mouth rinse  Assessment: 66 yoM with PMH AAA, CAD s/p PCI in  1994, PAD s/p stenting in 1990s and fem bypass in 2012, renal artery stenosis, CKD3, COPD, Hx LGIB, melanoma, Hx TB, presents 12/12 w/ weakness & dyspnea.  CTA chest shows large L > R bilateral PE. Pharmacy consulted to start IV heparin . Prior anticoagulation: none  Significant events:  Today, 12/03/2024: Heparin  level 0.57, therapeutic after increase to heparin  1250 units/hr CBC- Hg 11.7, now slightly low; pltc WNL No bleeding or infusion related concerns reported by nursing  Goal of Therapy: Heparin  level 0.3-0.7 units/ml Monitor platelets by anticoagulation protocol: Yes  Plan: Continue IV Heparin  1250 units/hr IV infusion Daily CBC, daily heparin  level once stable Monitor for signs of bleeding or thrombosis   Thank you for allowing pharmacy to be a part of this patients care.  Wanda Hasting PharmD, BCPS [facility] main pharmacy [phone] 12/03/2024 2:25 PM               [1]  Allergies Allergen Reactions   Penicillins     Childhood reaction  REACTION: rapid pulse Did it involve swelling of the face/tongue/throat, SOB, or low BP? Yes Did it involve sudden or severe rash/hives, skin peeling, or any reaction on the inside of your mouth or nose? Unknown Did you need to seek medical attention at a hospital or doctor's office? yes When did it last happen?      childhood  If all above answers are NO, may proceed with cephalosporin use.    Fish Oil     Other reaction(s): Heart attack symptoms   Other     Oily seafood salmon-- chest pain   Penicillin G     Other reaction(s): Unknown   Mold Extract [Trichophyton] Rash

## 2024-12-03 NOTE — Progress Notes (Signed)
 Daily Progress Note   Patient Name: Dean [name].       Date: 12/03/2024 DOB: May 28, 1946  Age: 78 y.o. MRN#: [id number] Attending Physician: Sherrill Alejandro Donovan, DO Primary Care Physician: Shepard Ade, MD Admit Date: 11/29/2024 Length of Stay: 4 days  Reason for Consultation/Follow-up: Establishing goals of care  Subjective:   Reviewed EMR including recent documentation from hospitalist, PCCM, and SLP.  Reviewed recent vital signs noting patient remains on HHFNC though this has decreased in O2 flow rate down to 30 L/min with FiO2 72%.  Patient was noted by PCCM to have worsening bibasilar opacities on chest x-ray and so patient was started on antibiotics for concern of pneumonia.  Personally reviewed chest x-ray from 12/03/2024 noting increased bibasilar airspace opacities left greater than right. Discussed care with bedside RN for medical updates.  Presented to bedside to meet with patient.  Patient's brother, Dean [name], also present at bedside during conversation.  Introduced myself as a member of the palliative medicine team and my role in patient's medical journey.  Patient laying comfortably in bed.  Inquired about patient's symptom burden at this time.  Patient feels that his shortness of breath has been improving with medical management.  Discussed updates regarding medical care including the fact that patient has been started on antibiotics for concerns of pneumonia.  Spent time answering questions as able regarding this. With permission, also able to discuss care planning moving forward.  Discussed current medical pathway patient is receiving including aggressive medical interventions.  Patient is DNR/DNI though receiving aggressive medical interventions up to that point.  Also discussed alternative pathway of focusing on patient's comfort if he feels the medical interventions are not supporting his quality of life.  At this time, patient feels that he wants to continue with current  medical interventions feeling that his symptom burden is improving.  Patient willing to undergo further workup for swallowing evaluation.  Did discuss if patient having difficulties with swallowing noted from study, would need to readdress goals moving forward.  Also discussed that patient could deteriorate at any time though hopefully will continue to improve, though may take time with his underlying lung disease.  Patient acknowledged this.   Did inquire about decision makers if patient was unable to speak for himself.  Patient noted his brother and sister would be his decision makers regarding medical care.  Acknowledged this.  Spent time providing emotional support via active listening.  Noted palliative medicine team continue to follow along with patient's medical journey.  Also spoke with patient's brother outside of room to answer questions regarding care.  Discussed at this time continuing appropriate medical interventions though acknowledging patient could deteriorate at any time. Patient's brother asked this provider to call patient's sister, Dean [name].  Able to call Dean and introduced myself as a member of the palliative medicine team.  Dean is a audiological scientist.  Able to discuss conversation with her brother today and at this time continuing appropriate medical interventions.  Did acknowledge patient's medical status remains tenuous and patient could very quickly decompensate.  Dean acknowledges this as well.  Pat asking for resources regarding cremation even to just plan for the future.  Acknowledged and noted would reach out to [facility] and chaplain regarding this.  Spent time answering questions as able.  Noted palliative medicine team to continue to follow along with patient's medical journey.  Objective:   Vital Signs:  BP 136/60 (BP Location: Left Arm)   Pulse (!) 59  Temp 98.9 F (37.2 C) (Oral)   Resp 17   Ht 5' 5 (1.651 m)   Wt 67.4 kg   SpO2 91%   BMI 24.73 kg/m   Physical  Exam: General: NAD, alert, chronically ill-appearing, frail Cardiovascular: RRR Respiratory: on HHFNC 30 L/min at FiO2 72%, not in respiratory distress Abdomen: not distended Neuro: A&Ox4, following commands easily Psych: appropriately answers all questions  Assessment & Plan:   Assessment: Patient is a 78 year old male with a past medical history of AAA, osteoarthritis of the hip, DDD of the cervical and lumbar spine, CAD s/p PCI, stage III CKD, diverticulosis, mixed type COPD with chronic dyspnea, GERD/hiatal hernia, hypertension, hyperlipidemia, obstructive sleep apnea, PAD, parotid mass, PVCs, and renal artery stenosis who was admitted on 11/29/2024 for management of generalized weakness and dyspnea.  Patient was found to have bilateral central pulmonary emboli, left greater than right, extending to the segmental and subsegmental levels of the left lower, left upper, right middle, and right lower lobes.  Patient also receiving workup for concerns related to dysphagia.  Palliative medicine team consulted to assist with complex medical decision making.  Recommendations/Plan: # Complex medical decision making/goals of care:  - Discussed care with patient, patient's brother, and patient's sister as detailed above in HPI.  At this time patient's oxygen  requirements slowly decreasing.  Patient wanting to continue with aggressive medical interventions at this time.  Does remain DNR/DNI.  Patient and family acknowledge rapid deterioration could occur.  Continuing to evaluate patient's medical situation daily.  Patient acknowledges he can always inform medical team if he feels medical interventions are no longer supporting his quality of life and they can be discontinued at that time.  Patient stating if he was unable to make medical decisions for himself, his brother Dean [name] and sister Dean would make medical decisions on his behalf.  Continuing appropriate medical interventions at this time.  Palliative  medicine team continuing to follow along with patient's medical journey.  -  Code Status: Limited: Do not attempt resuscitation (DNR) -DNR-LIMITED -Do Not Intubate/DNI   # Psychosocial Support:  - Brother, sister  # Discharge Planning: To Be Determined  Discussed with: Patient, patient's brother and sister, PCCM, RN, hospitalist, SLP  Thank you for allowing the palliative care team to participate in the care [name].SABRA Tinnie Radar, DO Palliative Care Provider PMT # [phone]  If patient remains symptomatic despite maximum doses, please call PMT at [phone] between 0700 and 1900. Outside of these hours, please call attending, as PMT does not have night coverage.  Personally spent 50 minutes in patient care including extensive chart review (labs, imaging, progress/consult notes, vital signs), medically appropraite exam, discussed with treatment team, education to patient, family, and staff, documenting clinical information, medication review and management, coordination of care, and available advanced directive documents.

## 2024-12-03 NOTE — Progress Notes (Addendum)
 Chaplains received a referral to provide support to Dean [name] and his family.  Dean [name] was somewhat drowsy during our visit, but stated that he was feeling okay.  His brother, Dean [name], was at bedside and concurred that Dean [name] was doing fine for right now.  I spoke with Dean [name]'s sister, Dean [name], about some resources to help them with logistics.  She is also a orthoptist and is planning to come down from [place]  to see her brother.    His brother asked questions about HCPOA.  Given that he is unmarried and has no children, we discussed that his siblings would be his next of kin.  Given his level of alertness, I shared that it would be difficult to find a moment when he is alert/oriented enough to sign a legal document.  Dean [name] (brother) stated that they are all in agreement and so do not feel that it is necessary to complete paperwork.    We will continue to check in with them as we are able, but please also page as support needs arise.  [address] B[facility] Pager, [phone]

## 2024-12-03 NOTE — Progress Notes (Signed)
 PHARMACY - ANTICOAGULATION CONSULT NOTE  Pharmacy Consult for IV heparin  Indication: pulmonary embolus  Allergies[1]  Patient Measurements: Height: 5' 5 (165.1 cm) Weight: 67.4 kg (148 lb 9.4 oz) IBW/kg (Calculated) : 61.5 HEPARIN  DW (KG): 67.4  Vital Signs: Temp: 98.9 F (37.2 C) (12/16 0427) Temp Source: Oral (12/16 0427) BP: 136/60 (12/16 0400) Pulse Rate: 59 (12/16 0400)  Labs: Recent Labs    12/01/24 0304 12/01/24 1356 12/01/24 2149 12/02/24 0302 12/03/24 0306  HGB 13.7  --   --  12.3* 11.7*  HCT 43.5  --   --  38.5* 36.8*  PLT 212  --   --  186 165  HEPARINUNFRC 0.71*   < > 0.48 0.30 0.29*  CREATININE 1.85*  --   --  1.61* 1.59*   < > = values in this interval not displayed.    Estimated Creatinine Clearance: 33.8 mL/min (A) (by C-G formula based on SCr of 1.59 mg/dL (H)).   Medical History: Past Medical History:  Diagnosis Date   AAA (abdominal aortic aneurysm) 2021   followed by dr valerie;   incidental finding infrarenal aaa 3.1 x 2.8 per abd/pelvis ct 02-26-20 epic;   last CT  03-10-2022  3.4cm   Arthritis    hips, back, neck    Benign localized prostatic hyperplasia with lower urinary tract symptoms (LUTS)    urologist--- dr nieves   CAD (coronary artery disease) 1994   cardiologist---  dr mona;   s/p angioplasty and stenting x3 1994;   nuclear stress test 10-15-2020  low risk, no ischemia, nuclear ef 59%   Chronic stable angina 2005   CKD (chronic kidney disease), stage III (HCC)    Complication of anesthesia    awareness during anesthesia;  w/ cholestectomy in 1990s pt stated thinks he stopped breathing   COPD mixed type (HCC)    pulmolonogist--- dr c. young   DDD (degenerative disc disease), cervical    Diverticulosis of colon    Dyspnea    chronic   Edema of both lower extremities    GERD (gastroesophageal reflux disease)    hx of   Hiatal hernia    History of adenomatous polyp of colon    History of kidney stones    History of  lower GI bleeding 02/2022   admission in epic;   diverticular bleed , no transfusions   History of melanoma    per pt excision forehead , localized   History of prediabetes    History of tuberculosis 1966   Hyperlipidemia    Hypertension    Neuromuscular disorder (HCC)    nerve issues with back    OSA on CPAP 2000   followed by dr c. young;   sleep study in epic , severe osa;   wears cpap auto set 5 to 20   PAD (peripheral artery disease)    vascular--- dr eliza;   hx bilateral CIA stenting in 1990s;  left occluded, dr gerlean s/p  right to left fem-fem bypass 09/ 2012,  renal artery stenosis and aaa   Parotid mass    followed by dr jesus  (ENT);  per pt stable   Peripheral vascular disease    PVC's (premature ventricular contractions)    Renal artery stenosis    02-14-2020  renal angiogram left > right renal stenosis   S/P primary angioplasty with coronary stent 1994    Medications:  Medications Prior to Admission  Medication Sig Dispense Refill Last Dose/Taking   albuterol  (VENTOLIN  HFA)  108 (90 Base) MCG/ACT inhaler INHALE 1 PUFF BY MOUTH INTO THE LUNGS TWICE DAILY AS NEEDED FOR WHEEZING OR SHORTNESS OF BREATH 8.5 g 5 Past Week   antiseptic oral rinse (BIOTENE) LIQD 15 mLs by Mouth Rinse route daily as needed for dry mouth.    Unknown   aspirin  EC 81 MG tablet Take 81 mg by mouth daily. Swallow whole.   Unknown   Azelastine -Fluticasone  137-50 MCG/ACT SUSP Place 1-2 sprays into both nostrils at bedtime. (Patient not taking: Reported on 11/30/2024) 23 g 3 Not Taking   Cholecalciferol (VITAMIN D-3 PO) Take 1 capsule by mouth every other day.      diltiazem  (DILT-XR) 120 MG 24 hr capsule Take 1 capsule (120 mg total) by mouth daily. (Patient not taking: Reported on 11/30/2024) 90 capsule 2 Not Taking   docusate sodium  (COLACE) 100 MG capsule Take 100 mg by mouth daily.      esomeprazole (NEXIUM) 40 MG capsule Take 40 mg by mouth daily as needed (acid reflux/indigestion.).       finasteride  (PROSCAR ) 5 MG tablet Take 1 tablet by mouth at bedtime.      furosemide  (LASIX ) 20 MG tablet TAKE 1 TABLET(20 MG) BY MOUTH DAILY AS NEEDED 30 tablet 0    irbesartan  (AVAPRO ) 150 MG tablet Take 150 mg by mouth every evening.      isosorbide  mononitrate (IMDUR ) 60 MG 24 hr tablet Take 60 mg by mouth every evening.      loperamide  (IMODIUM  A-D) 2 MG tablet Take 2 mg by mouth as needed.      MAGNESIUM -OXIDE 400 (240 Mg) MG tablet Take 400 mg by mouth every other day.      Menthol , Topical Analgesic, (BLUE-EMU MAXIMUM STRENGTH EX) Apply 1 application topically 3 (three) times daily as needed (pain.).      Polyethyl Glycol-Propyl Glycol (SYSTANE ULTRA OP) Place 1 drop into both eyes daily as needed (Dry eye).      potassium chloride  (KLOR-CON  M) 10 MEQ tablet Take 1 tablet on the days Lasix  is taken. 30 tablet 2    rosuvastatin (CRESTOR) 10 MG tablet Take 10 mg by mouth daily.      simethicone  (MYLICON) 125 MG chewable tablet Chew 125 mg by mouth every 6 (six) hours as needed for flatulence. Takes gas x      Tiotropium Bromide-Olodaterol (STIOLTO RESPIMAT ) 2.5-2.5 MCG/ACT AERS Inhale 2 puffs into the lungs daily. 4 g 12    traMADol  (ULTRAM ) 50 MG tablet Take 1 tablet by mouth 4 (four) times daily as needed.      Scheduled:   albuterol   2.5 mg Nebulization QID   amLODipine   5 mg Oral Daily   arformoterol   15 mcg Nebulization BID   budesonide  (PULMICORT ) nebulizer solution  0.25 mg Nebulization BID   Chlorhexidine  Gluconate Cloth  6 each Topical Daily   mouth rinse  15 mL Mouth Rinse 4 times per day   polyethylene glycol  17 g Oral BID   potassium chloride   40 mEq Oral Once   revefenacin   175 mcg Nebulization Daily   senna-docusate  1 tablet Oral BID   sodium chloride  HYPERTONIC  4 mL Nebulization Q4H   PRN: acetaminophen  **OR** acetaminophen , dextrose , hydrALAZINE , HYDROmorphone  (DILAUDID ) injection, labetalol , ondansetron  **OR** ondansetron  (ZOFRAN ) IV, mouth  rinse  Assessment: 5 yoM with PMH AAA, CAD s/p PCI in 1994, PAD s/p stenting in 1990s and fem bypass in 2012, renal artery stenosis, CKD3, COPD, Hx LGIB, melanoma, Hx TB, presents 12/12 w/  weakness & dyspnea. CTA chest shows large L > R bilateral PE. Pharmacy consulted to start IV heparin . Prior anticoagulation: none  Significant events:  Today, 12/03/2024: Heparin  level = 0.29, now sub-therapeutic with heparin  infusing at 1100 units/hr CBC- Hg 11.7, now slightly low; pltc WNL No bleeding or infusion related concerns reported by nursing  Goal of Therapy: Heparin  level 0.3-0.7 units/ml Monitor platelets by anticoagulation protocol: Yes  Plan: Increase IV Heparin  to 1250 units/hr IV infusion Recheck heparin  level in 8h Daily CBC, daily heparin  level once stable Monitor for signs of bleeding or thrombosis   Thank you for allowing pharmacy to be a part of this patients care.  Rosaline Millet, PharmD, BCPS 12/03/2024, 4:35 AM             [1]  Allergies Allergen Reactions   Penicillins     Childhood reaction  REACTION: rapid pulse Did it involve swelling of the face/tongue/throat, SOB, or low BP? Yes Did it involve sudden or severe rash/hives, skin peeling, or any reaction on the inside of your mouth or nose? Unknown Did you need to seek medical attention at a hospital or doctor's office? yes When did it last happen?      childhood  If all above answers are NO, may proceed with cephalosporin use.    Fish Oil     Other reaction(s): Heart attack symptoms   Other     Oily seafood salmon-- chest pain   Penicillin G     Other reaction(s): Unknown   Mold Extract [Trichophyton] Rash

## 2024-12-03 NOTE — Progress Notes (Addendum)
 NAME:  Dean Ewing., MRN:  [id number], DOB:  1946/02/20, LOS: 4 ADMISSION DATE:  11/29/2024, CONSULTATION DATE:  12/03/2024 REFERRING MD:  Dr. Celinda, CHIEF COMPLAINT:  PE   History of Present Illness:  78 year old male with past medical history as below, which is significant for coronary artery disease, stage III CKD, COPD, OSA on CPAP, renal artery stenosis, AAA, and GI bleed.  Who presented to [facility] emergency department 12/12 with complaints of generalized weakness and dyspnea.  Evaluation in the emergency department included chest x-ray which showed cardiomegaly and chronic interstitial prominence.  CT angiogram of the chest demonstrated large left-sided pulmonary embolism and smaller pulmonary embolism on the right.  RV LV ratio 0.9.  He was admitted to the hospital service on a heparin  infusion.  In the evening hours patient began to have increasing oxygen  requirement and PCCM was asked to evaluate.  Pertinent  Medical History   has a past medical history of AAA (abdominal aortic aneurysm) (2021), Arthritis, Benign localized prostatic hyperplasia with lower urinary tract symptoms (LUTS), CAD (coronary artery disease) (1994), Chronic stable angina (2005), CKD (chronic kidney disease), stage III (HCC), Complication of anesthesia, COPD mixed type (HCC), DDD (degenerative disc disease), cervical, Diverticulosis of colon, Dyspnea, Edema of both lower extremities, GERD (gastroesophageal reflux disease), Hiatal hernia, History of adenomatous polyp of colon, History of kidney stones, History of lower GI bleeding (02/2022), History of melanoma, History of prediabetes, History of tuberculosis (1966), Hyperlipidemia, Hypertension, Neuromuscular disorder (HCC), OSA on CPAP (2000), PAD (peripheral artery disease), Parotid mass, Peripheral vascular disease, PVC's (premature ventricular contractions), Renal artery stenosis, and S/P primary angioplasty with coronary stent (1994).   Significant  Hospital Events: Including procedures, antibiotic start and stop dates in addition to other pertinent events   12/12 admit to [facility] for high risk PE 12/13 increased hypoxia requiring HFNC, evolving left lower lobe infiltrate 12/15 on high flow with cough and phlegm yellow.  Starting HAP coverage antibiotics.  Interim History / Subjective:  Severe hypoxemia on high flow nasal cannula.  No other significant overnight events but unable to wean off O2.  Objective    Blood pressure 136/60, pulse 74, temperature 98.6 F (37 C), temperature source Oral, resp. rate 17, height 5' 5 (1.651 m), weight 67.4 kg, SpO2 (!) 85%.    FiO2 (%):  [60 %-75 %] 73 %   Intake/Output Summary (Last 24 hours) at 12/03/2024 0925 Last data filed at 12/03/2024 0600 Gross per 24 hour  Intake 855.98 ml  Output 550 ml  Net 305.98 ml   Filed Weights   11/29/24 1314  Weight: 67.4 kg   Examination: General: Elderly male appearing weak. Lungs: Clear to auscultation. Heart: regular rate rhythm, no murmur appreciated.  Difficult to evaluate JVD. Abdomen: non tender, non distended. Normal BS.  Neuro: All oriented x 3.  Moving all 4 extremities but significantly weak.   Resolved problem list   Assessment and Plan   Acute Hypoxic Respiratory Failure  Pulmonary Embolism:  LLL PNA: CT chest reviewed by me with distal left main PA blood clot traversing in the left upper and lower lobe along with right sided segmental and subsegmental blood clot.  There is evolving left lower lobe infiltrate with air bronchograms concerning for pneumonia though this could be secondary to atelectasis versus early infarct. CXR>worsening bibasilar opacities.  Spirometry 2018: COPD with FEV1 38% predicted.  Significant clot burden on the left, lesser burden on the right. No RV strain on CT. Pro BNP 9331,  Trop 132. No lactic sent. PESI class 5, BOVA 4. Very high risk. RV/LV ratio 0.9. POCUS shows normal RV size and function. Formal  echocardiogram shows normal RV size and function. - Continue heparin  drip. Can transition to lovenox  from [facility] perspective.  - Continue ICS/LAMA/LABA.  On Stiolto at home.  May need to transition to Trelegy on discharge. - Continue hypertonic saline nebs QID - Continue albuterol  4 times daily. - Continue aggressive chest PT - Wean O2 as tolerated.  - Holding diuretics as patient appears euvolemic on examination.  Recheck BNP.  If trending up will resume home diuretic. - Starting Vanco and cefepime > DC Vanco MRSA negative.  RVP negative sputum culture pending.  - Limited echo with bubble study to rule out shunt causing hypoxemia.  Severe OSA on CPAP>repeat PSG w/o OSA.  - cont w HFNC at night.   COPD without exacerbation - ICS/LAMA/LABA with Budesonide /Yupelri /Brovana  - See above.  Pulmonary will follow.  Labs   CBC: Recent Labs  Lab 11/29/24 0701 11/30/24 0249 12/01/24 0304 12/02/24 0302 12/03/24 0306  WBC 8.6 8.9 9.6 10.4 9.0  NEUTROABS 6.5  --  7.6 8.4* 7.1  HGB 14.5 13.6 13.7 12.3* 11.7*  HCT 44.6 42.4 43.5 38.5* 36.8*  MCV 97.2 96.1 97.8 98.0 99.2  PLT 202 187 212 186 165    Basic Metabolic Panel: Recent Labs  Lab 11/29/24 0701 11/29/24 0925 11/30/24 0249 11/30/24 0810 12/01/24 0304 12/02/24 0302 12/03/24 0306  NA 146*  --  143  --  143 143 143  K 2.9*  --  2.9*  --  3.5 3.7 4.0  CL 101  --  101  --  103 106 106  CO2 29  --  28  --  27 29 26   GLUCOSE 72  --  69*  --  111* 99 71  BUN 15  --  16  --  18 19 22   CREATININE 1.74*  --  1.53*  --  1.85* 1.61* 1.59*  CALCIUM  8.9  --  8.3*  --  8.5* 8.4* 8.5*  MG  --  1.8  --  2.3 2.3 2.1 2.1  PHOS  --  3.4  --  3.5 3.7 2.8 3.1   GFR: Estimated Creatinine Clearance: 33.8 mL/min (A) (by C-G formula based on SCr of 1.59 mg/dL (H)). Recent Labs  Lab 11/30/24 0249 12/01/24 0304 12/02/24 0302 12/03/24 0306  WBC 8.9 9.6 10.4 9.0    Liver Function Tests: Recent Labs  Lab 11/29/24 0701 11/30/24 0249  12/01/24 0304 12/02/24 0302 12/03/24 0306  AST 30 26 23 20 22   ALT 7 7 6 6 8   ALKPHOS 118 109 108 91 94  BILITOT 0.6 0.6 0.6 0.6 0.5  PROT 7.2 6.8 6.7 6.2* 6.2*  ALBUMIN 3.5 3.3* 3.2* 2.9* 3.0*   No results for input(s): LIPASE, AMYLASE in the last 168 hours. No results for input(s): AMMONIA in the last 168 hours.  ABG    Component Value Date/Time   TCO2 26 01/03/2023 0653     Coagulation Profile: No results for input(s): INR, PROTIME in the last 168 hours.  Cardiac Enzymes: No results for input(s): CKTOTAL, CKMB, CKMBINDEX, TROPONINI in the last 168 hours.  HbA1C: Hgb A1c MFr Bld  Date/Time Value Ref Range Status  11/05/2019 10:49 AM 6.6 (H) 4.8 - 5.6 % Final    Comment:    (NOTE) Pre diabetes:          5.7%-6.4% Diabetes:              >  6.4% Glycemic control for   <7.0% adults with diabetes     CBG: Recent Labs  Lab 12/02/24 1746 12/02/24 1954 12/02/24 2318 12/03/24 0413 12/03/24 0743  GLUCAP 112* 90 78 124* 84    Review of Systems:   Not obtained  Past Medical History:  He,  has a past medical history of AAA (abdominal aortic aneurysm) (2021), Arthritis, Benign localized prostatic hyperplasia with lower urinary tract symptoms (LUTS), CAD (coronary artery disease) (1994), Chronic stable angina (2005), CKD (chronic kidney disease), stage III (HCC), Complication of anesthesia, COPD mixed type (HCC), DDD (degenerative disc disease), cervical, Diverticulosis of colon, Dyspnea, Edema of both lower extremities, GERD (gastroesophageal reflux disease), Hiatal hernia, History of adenomatous polyp of colon, History of kidney stones, History of lower GI bleeding (02/2022), History of melanoma, History of prediabetes, History of tuberculosis (1966), Hyperlipidemia, Hypertension, Neuromuscular disorder (HCC), OSA on CPAP (2000), PAD (peripheral artery disease), Parotid mass, Peripheral vascular disease, PVC's (premature ventricular contractions), Renal  artery stenosis, and S/P primary angioplasty with coronary stent (1994).   Surgical History:   Past Surgical History:  Procedure Laterality Date   APPENDECTOMY  1974   CATARACT EXTRACTION W/ INTRAOCULAR LENS IMPLANT Bilateral 2019   CHOLECYSTECTOMY, LAPAROSCOPIC     1990s   COLONOSCOPY WITH PROPOFOL   04/27/2022   dr abran   CORONARY ANGIOPLASTY WITH STENT PLACEMENT  1994   CYSTOSCOPY WITH INSERTION OF UROLIFT N/A 04/14/2020   Procedure: CYSTOSCOPY WITH INSERTION OF UROLIFT;  Surgeon: Nieves Cough, MD;  Location: [facility];  Service: Urology;  Laterality: N/A;   CYSTOSCOPY WITH STENT PLACEMENT Right 07/07/2020   Procedure: CYSTOSCOPY WITH STENT CHANGE;  Surgeon: Nieves Cough, MD;  Location: [facility];  Service: Urology;  Laterality: Right;   CYSTOSCOPY WITH URETEROSCOPY AND STENT PLACEMENT Bilateral 01/03/2020   Procedure: CYSTOSCOPY WITH RETROGRADE/ URETEROSCOPY AND RIGHT STENT PLACEMENT BLADDER BIOPSY;  Surgeon: Nieves Cough, MD;  Location: [facility];  Service: Urology;  Laterality: Bilateral;  ONLY NEEDS 30 MIN   CYSTOSCOPY WITH URETEROSCOPY AND STENT PLACEMENT Right 04/14/2020   Procedure: CYSTOSCOPY WITH URETEROSCOPY, RETROGRADE  AND STENT REPLACEMENT;  Surgeon: Nieves Cough, MD;  Location: [facility];  Service: Urology;  Laterality: Right;   CYSTOSCOPY/URETEROSCOPY/HOLMIUM LASER/STENT PLACEMENT  05/18/2005   @[facility]  by dr andra OCTAVE BYPASS GRAFT  08/31/2011   @[facility]  by dr gerlean;  right to left   ILIAC ARTERY STENT Bilateral    1990s;   bilateral common iliac artery stenting   LAPAROSCOPY ABDOMEN DIAGNOSTIC  12/03/2008   @[facility]  by dr ebbie;   Lap. lysis adhesions/  open umbilical hernia repair   LUMBAR LAMINECTOMY  09/07/2004   @[facility]  by dr pool   RENAL ANGIOGRAPHY N/A 02/14/2020   Procedure: RENAL ANGIOGRAPHY;  Surgeon: Harvey Carlin BRAVO, MD;  Location: [facility];  Service:  Cardiovascular;  Laterality: N/A;  Bilateral   ROBOT ASSISTED PYELOPLASTY Right 11/20/2020   Procedure: XI ROBOTIC RIGHT ASSISTED PYELOPLASTY AND STENT PLACEMENT;  Surgeon: Cam Morene ORN, MD;  Location: [facility];  Service: Urology;  Laterality: Right;   TONSILLECTOMY     child   TOTAL HIP ARTHROPLASTY Right 07/15/2020   Procedure: TOTAL HIP ARTHROPLASTY ANTERIOR APPROACH;  Surgeon: Melodi Lerner, MD;  Location: [facility];  Service: Orthopedics;  Laterality: Right;    UMBILICAL HERNIA REPAIR  2004     Social History:   reports that he has been smoking cigarettes. He has a 76.5 pack-year smoking  history. He has never been exposed to tobacco smoke. He has never used smokeless tobacco. He reports that he does not currently use alcohol . He reports that he does not use drugs.   Family History:  His family history includes Breast cancer in his sister; Cancer in his mother and sister; Diabetes in his father; Hyperlipidemia in his brother, mother, and sister; Hypertension in his brother, father, mother, and sister; Kidney cancer in his mother; Other in his father; Prostate cancer in his brother. There is no history of Colon cancer, Colon polyps, Esophageal cancer, Rectal cancer, or Stomach cancer.   Allergies Allergies[1]   Home Medications  Prior to Admission medications  Medication Sig Start Date End Date Taking? Authorizing Provider  albuterol  (VENTOLIN  HFA) 108 (90 Base) MCG/ACT inhaler INHALE 1 PUFF BY MOUTH INTO THE LUNGS TWICE DAILY AS NEEDED FOR WHEEZING OR SHORTNESS OF BREATH 08/08/24   Neysa Rama D, MD  antiseptic oral rinse (BIOTENE) LIQD 15 mLs by Mouth Rinse route daily as needed for dry mouth.     [provider]  aspirin  EC 81 MG tablet Take 81 mg by mouth daily. Swallow whole.    [provider]  Azelastine -Fluticasone  137-50 MCG/ACT SUSP Place 1-2 sprays into both nostrils at bedtime. 05/10/23   Neysa Rama D, MD  Cholecalciferol (VITAMIN D-3 PO) Take  1 capsule by mouth every other day.    [provider]  diltiazem  (DILT-XR) 120 MG 24 hr capsule Take 1 capsule (120 mg total) by mouth daily. 04/13/23   Hilty, Vinie BROCKS, MD  docusate sodium  (COLACE) 100 MG capsule Take 100 mg by mouth daily.    [provider]  esomeprazole (NEXIUM) 40 MG capsule Take 40 mg by mouth daily as needed (acid reflux/indigestion.). 02/07/19   [provider]  finasteride  (PROSCAR ) 5 MG tablet Take 1 tablet by mouth at bedtime.    [provider]  furosemide  (LASIX ) 20 MG tablet TAKE 1 TABLET(20 MG) BY MOUTH DAILY AS NEEDED 05/28/24   Hilty, Vinie BROCKS, MD  irbesartan  (AVAPRO ) 150 MG tablet Take 150 mg by mouth every evening. 11/11/19   [provider]  isosorbide  mononitrate (IMDUR ) 60 MG 24 hr tablet Take 60 mg by mouth every evening.    [provider]  loperamide  (IMODIUM  A-D) 2 MG tablet Take 2 mg by mouth as needed.    [provider]  MAGNESIUM -OXIDE 400 (240 Mg) MG tablet Take 400 mg by mouth every other day.    [provider]  Menthol , Topical Analgesic, (BLUE-EMU MAXIMUM STRENGTH EX) Apply 1 application topically 3 (three) times daily as needed (pain.).    [provider]  Polyethyl Glycol-Propyl Glycol (SYSTANE ULTRA OP) Place 1 drop into both eyes daily as needed (Dry eye).    [provider]  potassium chloride  (KLOR-CON  M) 10 MEQ tablet Take 1 tablet on the days Lasix  is taken. 04/13/23   Hilty, Vinie BROCKS, MD  rosuvastatin (CRESTOR) 10 MG tablet Take 10 mg by mouth daily.    [provider]  simethicone  (MYLICON) 125 MG chewable tablet Chew 125 mg by mouth every 6 (six) hours as needed for flatulence. Takes gas x    [provider]  Tiotropium Bromide-Olodaterol (STIOLTO RESPIMAT ) 2.5-2.5 MCG/ACT AERS Inhale 2 puffs into the lungs daily. 06/04/24   Neysa Rama D, MD  traMADol  (ULTRAM ) 50 MG tablet Take 1 tablet by mouth 4 (four) times daily as needed.  03/21/22   [provider]  CRITICAL CARE Performed by: Sammi JONETTA Fredericks.     Total critical care time: 32 minutes   Critical care time was exclusive of separately billable procedures and treating other patients.   Critical care was necessary to treat or prevent imminent or life-threatening deterioration.   Critical care was time spent personally by me on the following activities: development of treatment plan with patient and/or surrogate as well as nursing, discussions with consultants, evaluation of patient's response to treatment, examination of patient, obtaining history from patient or surrogate, ordering and performing treatments and interventions, ordering and review of laboratory studies, ordering and review of radiographic studies, pulse oximetry, re-evaluation of patient's condition and participation in multidisciplinary rounds.  Sammi JONETTA Fredericks, MD Pulmonary, Critical Care and Sleep Attending.   12/03/2024, 9:38 AM              [1]  Allergies Allergen Reactions   Penicillins     Childhood reaction  REACTION: rapid pulse Did it involve swelling of the face/tongue/throat, SOB, or low BP? Yes Did it involve sudden or severe rash/hives, skin peeling, or any reaction on the inside of your mouth or nose? Unknown Did you need to seek medical attention at a hospital or doctor's office? yes When did it last happen?      childhood  If all above answers are NO, may proceed with cephalosporin use.    Fish Oil     Other reaction(s): Heart attack symptoms   Other     Oily seafood salmon-- chest pain   Penicillin G     Other reaction(s): Unknown   Mold Extract [Trichophyton] Rash

## 2024-12-03 NOTE — Progress Notes (Signed)
 Speech Language Pathology Treatment: Dysphagia  Patient Details Name: Dean [name]. MRN: [id number] DOB: 10-12-1946 Today's Date: 12/03/2024 Time: 1000-1018 SLP Time Calculation (min) (ACUTE ONLY): 18 min  Assessment / Plan / Recommendation Clinical Impression  SLP will plan for FEES this week if it aligns with POC.  Dean [name] Dean [name] seen by SLP for skilled treatment focused on dysphagia goals. His brother was present in the room as well. RN was present and giving patient oral meds in applesauce as well as an ice chip. She reported that patient continues with tan colored secretions. Oxygen  saturations remained in range of 87-89% and he is on 30L oxygen  via HHFNC. Per RN, with exertion or if he removes his [place], he desats into the 70% range and takes several minutes to improve to baseline. With limited amount of PO's taken, he exhibited multiple swallows and delayed cough response. Unable to differentiate cough from potential aspiration or from secretion management. SLP discussed FEES swallow test option to patient and his brother. Patient verbally agreed.    HPI HPI: 78 y.o. patient who presented to the hospital on 11/29/24 with generalized weakness and dyspnea. CXR showed Increasing left basilar opacity, suspicious for infarction in this clinical setting of acute pulmonary emboli; CTA chest showed CTA chest with a large pulmonary embolus in the left pulmonary artery which extends into upper and lower lobe branches. He was admitted with PE. Due to concerns for dysphagia, patient made NPO awaiting SLP swallow evaluation. He has been satting in the low 90% on HFNC at 10L. PMH: CAd, CKD, COPD, DDD, dyspnea, GERD, hiatal hernia, OSA on CPAP, HTN, HLD, PAD, parotid mass (followed by ENT).      SLP Plan  Continue with current plan of care        Swallow Evaluation Recommendations         Recommendations  Diet recommendations: NPO Medication Administration: Crushed with  puree Supervision: Full supervision/cueing for compensatory strategies Compensations: Slow rate;Small sips/bites                  Oral care BID;Staff/trained caregiver to provide oral care   Intermittent Supervision/Assistance Dysphagia, unspecified (R13.10)     Continue with current plan of care     Dean IVAR Blase, MA, CCC-SLP Speech Therapy   12/03/2024, 11:09 AM

## 2024-12-03 NOTE — Progress Notes (Signed)
 Patient having episodes of desaturation , increased FIO2 to 96%, 35 LPM SPO2 is currently 91%.

## 2024-12-03 NOTE — Plan of Care (Signed)

## 2024-12-03 NOTE — Progress Notes (Signed)
 PROGRESS NOTE    Dean [name].  FMW:[id number] DOB: [birth year] DOA: 11/29/2024 PCP: Shepard Ade, MD   Brief Narrative:  Dean [name]. is a 78 y.o. male with medical history significant of AAA, osteoarthritis of the hip, DDD of the cervical and lumbar spine, CAD, history of coronary stent placement, stage III CKD, mixed type COPD, diverticulosis, chronic dyspnea, lower extremity edema, GERD/hiatal hernia, colon polyp, nephrolithiasis, history of lower GI bleed, history of melanoma, history of prediabetes, history of tuberculosis, hyperlipidemia, hypertension, OSA on CPAP, PAD, parotid mass PVCs, renal artery stenosis who presented to the emergency department complaints of generalized weakness and dyspnea. Found to have Bilateral DVTs.  Placed on anticoagulation and is on extremely high amount of oxygen .  Oxygen  was slowly weaning and he was on 30 L 60% FiO2 but worsened to 40 L 100% of FiO2. Pulm is recommending Abx and further adjustments as below. Respiratory Status very tenuous. Palliative Care consulted and continuing to have GOC discussions.   Assessment and Plan:  Pulmonary embolism (HCC)  Associated with Elevated brain natriuretic peptide (BNP) level And  Elevated troponin and Bilateral LE DVTs: No heart strain.  RV to LV ratio 0.9. Stepdown/inpatient.  -Supplemental oxygen  as needed and was worsening. Had to be put on 12 L HFNC + NRB Continue heparin  infusion but significantly worsened -Hydromorphone  as needed for pain. -Obtained Echocardiogram and showed EF of 60-65% G1DD and Normal RVSF.  -Obtained lower extremity Doppler aand had Acute DVT involving the right  posterior tibial veins, right peroneal veins, and right femoral vein w/ Cystic structure is found in the popliteal fossa and on there was DVT involving the left femoral vein, left posterior tibial veins, and left peroneal veins.  -CTA PE repeated and showed Stable bilateral central pulmonary emboli, left  greater than right, extending to the segmental and subsegmental levels of the left lower, left upper, right middle, and right lower lobes. Interval development of bubbly left lower lobe consolidation, likely representing developing pulmonary infarction. No right heart strain. -Pro-BNP went from 9,331.0 -> 7,251.0 -> 4,026.0 and Troponin T went from 132 -> 146 -PCCM and IR consulted. Received a Dose of IV Lasix  20 mg x1. IR recommends no thrombectomy and recommends A/C alone but recommending notifying IR if has Clinical Sx of Worsening PE burden or Hemodynamic Changes; -Hypercoagulable workup done and PTT lupus anticoagulant was 47.9, normal DRVVT was 87.6, DRVVT mix was 52.6, DRVVT confirm was 1.4, total protein C was 84, total protein S was 97, PTT LMX was 43.6 and PTT lupus  -Heparin  drip at this time being transitioned to Enoxaparin  -Pulm recommends to continue ICS/LAMA/LAMA and hypertonic saline nebs 4 times daily as well as DuoNebs 4 times daily and aggressive PT and wean O2 as tolerated. Today after the pulmonologist reviewed the CT of the chest they feel that the patient has an evolving left lower lobe infiltrate with air bronchograms concerning for pneumonia so they are recommending starting Abx and obtaining a Sputum Cx (Pending) and RVP (Negative). Initiated on IV Vac/ Cefepime  but since MRSA negative Vanc Stopped.  -Pulmonary also ordered LTD ECHO w/ Bubble to r/o Shunt and this was negative, with no evidence  of any interatrial shunt.  -SpO2: 92 % O2 Flow Rate (L/min): 40 L/min FiO2 (%): 100 % -Patient is on Stiolto at home and pulmonary recommends that we may need to transition to Trelegy at discharge -He went from 50 L and 80% FiO2 HHFNC to 30 L and 60%  FiO2 HHFNC yesterday but started desaturating today and is now back on 40 L 100% FiO2 HHFNC; CXR showed worsening B/L opacities  -Palliative care consulted for goals of care discussion and patient is now DNR/DNI with limited interventions  and allowing for comfort feeds.  After discussion with the family there is going to be a time-limited trial of current interventions for next 2 to 3 days and consideration of SNF rehabilitation with palliative but he declines further than recommendation will be for residential hospice facility and comfort care -PT/OT recommending SNF but no medically stable   Hypokalemia: K+ improved and is now 4.0. CTM and Replete as Necessary. Repeat CMP in the AM   Hypoglycemia: In the setting of being NPO. Restart cautious IVF with D5 in LR @ 75 mL/hr  Tobacco Abuse: May use NicoDerm or Nicorette gum.  AKI on CKD Stage 3a: Around his Baseline of 1.4-1.7. Recent Labs  Lab 11/29/24 0701 11/30/24 0249 12/01/24 0304 12/02/24 0302 12/03/24 0306  BUN 15 16 18 19 22   CREATININE 1.74* 1.53* 1.85* 1.61* 1.59*  -IVF as above  -Avoid Nephrotoxic Medications, Contrast Dyes, Hypotension and Dehydration to Ensure Adequate Renal Perfusion and will need to Renally Adjust Meds -Continue to Monitor and Trend Renal Function carefully and repeat CMP in the AM   Obstructive sleep apnea: Not on CPAP. PCCM recommending c/w HFNC @ night.    Essential HTN: On Amlodipine  10 mg p.o. daily and On Avapro  150 mg p.o. daily Will resume once cleared for oral intake. CTM BP per Protocol. Last BP reading was 148/123   Dysphagia: He was kept n.p.o. and SLP evaluated and recommended keeping n.p.o. except meds for now; may allow comfort feeds per prior discussion.  SLP to follow-up again and he is on too much oxygen  to do an MBS; SLP still recommending NPO   Coronary atherosclerosis: Has been on Aspirin , Amlodipine , Imdur  and Statin.   COPD mixed type [facility]): Pulmonary recommending Budesonide , Arfomoterol, and Revefenacin  along with DuoNeb QID and Hypertonic Saline Nebs. See above   GERD/GI Prophylaxis: Antiacid, H2 blocker or PPI as needed.   PAD (Peripheral Artery Disease): Discontinued aspirin  while on AC. Continue statin if  cleared by SLP    Hyperlipidemia / Aortic Atherosclerosis: On Rosuvastatin but currently on hold.   Normocytic Anemia: Hgb/Hct Trend dropping from admission (14.5/44.6) and is now 11.7/36.8. ? Dilutional Drop. Check Anemia Panel in the AM. CTM for S/Sx of Bleeding; No overt bleeding noted. Repeat CBC in the AM    Pressure injury of skin: Continue local care and preventive measures.  Wound 11/29/24 1431 Pressure Injury Heel Right Stage 1 -  Intact skin with non-blanchable redness of a localized area usually over a bony prominence. (Active)     Wound 11/29/24 1432 Pressure Injury Buttocks Medial Stage 1 -  Intact skin with non-blanchable redness of a localized area usually over a bony prominence. (Active)   Hypoalbuminemia: Patient's Albumin Level dropping since admission (3.5) and is now 3.0. CTM & Trend & repeat CMP in the AM   DVT prophylaxis: Heparin  gtt being transitioned to Enoxaparin     Code Status: Limited: Do not attempt resuscitation (DNR) -DNR-LIMITED -Do Not Intubate/DNI  Family Communication: D/w Brother @ bedside   Disposition Plan:  Level of care: Stepdown Status is: Inpatient Remains inpatient appropriate because: Needs further clinical improvement and clearance by the specialists    Consultants:  Pulmonary/PCCM IR Palliative Care Medicine   Procedures:  As delineated as above   Antimicrobials:  Anti-infectives (From admission, onward)    Start     Dose/Rate Route Frequency Ordered Stop   12/03/24 1100  vancomycin  (VANCOREADY) IVPB 750 mg/150 mL  Status:  Discontinued        750 mg 150 mL/hr over 60 Minutes Intravenous Every 24 hours 12/02/24 1001 12/03/24 0932   12/02/24 1100  ceFEPIme  (MAXIPIME ) 2 g in sodium chloride  0.9 % 100 mL IVPB        2 g 200 mL/hr over 30 Minutes Intravenous Every 12 hours 12/02/24 0958     12/02/24 1100  vancomycin  (VANCOREADY) IVPB 1250 mg/250 mL        1,250 mg 166.7 mL/hr over 90 Minutes Intravenous  Once 12/02/24 1001 12/02/24  1347       Subjective: Seen and examined at bedside and thinks she is doing okay but does not really feel very much improved.  No nausea or vomiting.  Still feels a little short of breath.  Denied any lightheadedness.  Feels very weak.  No other concerns or complaints this time.  Objective: Vitals:   12/03/24 1921 12/03/24 1928 12/03/24 1940 12/03/24 2000  BP:    (!) 148/123  Pulse: 76 74  86  Resp: 15 20  18   Temp:   97.7 F (36.5 C)   TempSrc:   Axillary   SpO2: (!) 82% 92%  (!) 84%  Weight:      Height:        Intake/Output Summary (Last 24 hours) at 12/03/2024 2150 Last data filed at 12/03/2024 1800 Gross per 24 hour  Intake 1024.77 ml  Output 525 ml  Net 499.77 ml   Filed Weights   11/29/24 1314  Weight: 67.4 kg   Examination: Physical Exam:  Constitutional: Elderly chronically ill-appearing Caucasian male who appears uncomfortable and in mild respiratory distress  Respiratory: Diminished to auscultation bilaterally with some coarse breath sounds and has some rhonchi and crackles no real appreciable wheezing.  Has an increased respiratory effort and is wearing heated high flow nasal cannula  Cardiovascular: Slightly tachycardic, no murmurs / rubs / gallops. S1 and S2 auscultated.  1+ extremity edema Abdomen: Soft, non-tender, non-distended. Bowel sounds positive.  GU: Deferred. Musculoskeletal: No clubbing / cyanosis of digits/nails. No joint deformity upper and lower extremities.  Skin: No rashes, lesions, ulcers on a limited skin evaluation. No induration; Warm and dry.  Neurologic: CN 2-12 grossly intact with no focal deficits. Romberg sign and cerebellar reflexes not assessed.  Psychiatric: Normal judgment and insight. Alert and oriented x 3. Normal mood and appropriate affect.   Data Reviewed: I have personally reviewed following labs and imaging studies  CBC: Recent Labs  Lab 11/29/24 0701 11/30/24 0249 12/01/24 0304 12/02/24 0302 12/03/24 0306  WBC  8.6 8.9 9.6 10.4 9.0  NEUTROABS 6.5  --  7.6 8.4* 7.1  HGB 14.5 13.6 13.7 12.3* 11.7*  HCT 44.6 42.4 43.5 38.5* 36.8*  MCV 97.2 96.1 97.8 98.0 99.2  PLT 202 187 212 186 165   Basic Metabolic Panel: Recent Labs  Lab 11/29/24 0701 11/29/24 0925 11/30/24 0249 11/30/24 0810 12/01/24 0304 12/02/24 0302 12/03/24 0306  NA 146*  --  143  --  143 143 143  K 2.9*  --  2.9*  --  3.5 3.7 4.0  CL 101  --  101  --  103 106 106  CO2 29  --  28  --  27 29 26   GLUCOSE 72  --  69*  --  111* 99  71  BUN 15  --  16  --  18 19 22   CREATININE 1.74*  --  1.53*  --  1.85* 1.61* 1.59*  CALCIUM  8.9  --  8.3*  --  8.5* 8.4* 8.5*  MG  --  1.8  --  2.3 2.3 2.1 2.1  PHOS  --  3.4  --  3.5 3.7 2.8 3.1   GFR: Estimated Creatinine Clearance: 33.8 mL/min (A) (by C-G formula based on SCr of 1.59 mg/dL (H)). Liver Function Tests: Recent Labs  Lab 11/29/24 0701 11/30/24 0249 12/01/24 0304 12/02/24 0302 12/03/24 0306  AST 30 26 23 20 22   ALT 7 7 6 6 8   ALKPHOS 118 109 108 91 94  BILITOT 0.6 0.6 0.6 0.6 0.5  PROT 7.2 6.8 6.7 6.2* 6.2*  ALBUMIN 3.5 3.3* 3.2* 2.9* 3.0*   No results for input(s): LIPASE, AMYLASE in the last 168 hours. No results for input(s): AMMONIA in the last 168 hours. Coagulation Profile: No results for input(s): INR, PROTIME in the last 168 hours. Cardiac Enzymes: No results for input(s): CKTOTAL, CKMB, CKMBINDEX, TROPONINI in the last 168 hours. BNP (last 3 results) Recent Labs    11/29/24 0701 11/30/24 0810 12/03/24 0306  PROBNP 9,331.0* 7,251.0* 4,026.0*   HbA1C: No results for input(s): HGBA1C in the last 72 hours. CBG: Recent Labs  Lab 12/03/24 0413 12/03/24 0743 12/03/24 1145 12/03/24 1518 12/03/24 1937  GLUCAP 124* 84 80 71 87   Lipid Profile: No results for input(s): CHOL, HDL, LDLCALC, TRIG, CHOLHDL, LDLDIRECT in the last 72 hours. Thyroid Function Tests: No results for input(s): TSH, T4TOTAL, FREET4, T3FREE,  THYROIDAB in the last 72 hours. Anemia Panel: No results for input(s): VITAMINB12, FOLATE, FERRITIN, TIBC, IRON, RETICCTPCT in the last 72 hours. Sepsis Labs: No results for input(s): PROCALCITON, LATICACIDVEN in the last 168 hours.  Recent Results (from the past 240 hours)  Resp panel by RT-PCR (RSV, Flu A&B, Covid) Anterior Nasal Swab     Status: None   Collection Time: 11/29/24  6:53 AM   Specimen: Anterior Nasal Swab  Result Value Ref Range Status   SARS Coronavirus 2 by RT PCR NEGATIVE NEGATIVE Final    Comment: (NOTE) SARS-CoV-2 target nucleic acids are NOT DETECTED.  The SARS-CoV-2 RNA is generally detectable in upper respiratory specimens during the acute phase of infection. The lowest concentration of SARS-CoV-2 viral copies this assay can detect is 138 copies/mL. A negative result does not preclude SARS-Cov-2 infection and should not be used as the sole basis for treatment or other patient management decisions. A negative result may occur with  improper specimen collection/handling, submission of specimen other than nasopharyngeal swab, presence of viral mutation(s) within the areas targeted by this assay, and inadequate number of viral copies(<138 copies/mL). A negative result must be combined with clinical observations, patient history, and epidemiological information. The expected result is Negative.  Fact Sheet for Patients:  bloggercourse.com  Fact Sheet for Healthcare Providers:  seriousbroker.it  This test is no t yet approved or cleared by the [place]  FDA and  has been authorized for detection and/or diagnosis of SARS-CoV-2 by FDA under an Emergency Use Authorization (EUA). This EUA will remain  in effect (meaning this test can be used) for the duration of the COVID-19 declaration under Section 564(b)(1) of the Act, 21 U.S.C.section 360bbb-3(b)(1), unless the authorization is terminated   or revoked sooner.       Influenza A by PCR NEGATIVE NEGATIVE Final   Influenza B  by PCR NEGATIVE NEGATIVE Final    Comment: (NOTE) The Xpert Xpress SARS-CoV-2/FLU/RSV plus assay is intended as an aid in the diagnosis of influenza from Nasopharyngeal swab specimens and should not be used as a sole basis for treatment. Nasal washings and aspirates are unacceptable for Xpert Xpress SARS-CoV-2/FLU/RSV testing.  Fact Sheet for Patients: bloggercourse.com  Fact Sheet for Healthcare Providers: seriousbroker.it  This test is not yet approved or cleared by the [place]  FDA and has been authorized for detection and/or diagnosis of SARS-CoV-2 by FDA under an Emergency Use Authorization (EUA). This EUA will remain in effect (meaning this test can be used) for the duration of the COVID-19 declaration under Section 564(b)(1) of the Act, 21 U.S.C. section 360bbb-3(b)(1), unless the authorization is terminated or revoked.     Resp Syncytial Virus by PCR NEGATIVE NEGATIVE Final    Comment: (NOTE) Fact Sheet for Patients: bloggercourse.com  Fact Sheet for Healthcare Providers: seriousbroker.it  This test is not yet approved or cleared by the [place]  FDA and has been authorized for detection and/or diagnosis of SARS-CoV-2 by FDA under an Emergency Use Authorization (EUA). This EUA will remain in effect (meaning this test can be used) for the duration of the COVID-19 declaration under Section 564(b)(1) of the Act, 21 U.S.C. section 360bbb-3(b)(1), unless the authorization is terminated or revoked.  Performed at [facility], [address] [place], [place] [address]   MRSA Next Gen by PCR, Nasal     Status: None   Collection Time: 11/29/24  1:27 PM   Specimen: Nasal Mucosa; Nasal Swab  Result Value Ref Range Status   MRSA by PCR Next Gen NOT DETECTED NOT  DETECTED Final    Comment: (NOTE) The GeneXpert MRSA Assay (FDA approved for NASAL specimens only), is one component of a comprehensive MRSA colonization surveillance program. It is not intended to diagnose MRSA infection nor to guide or monitor treatment for MRSA infections. Test performance is not FDA approved in patients less than 37 years old. Performed at [facility], [address] [place], [place] [address]   Respiratory (~20 pathogens) panel by PCR     Status: None   Collection Time: 12/02/24  2:11 PM   Specimen: Nasopharyngeal Swab; Respiratory  Result Value Ref Range Status   Adenovirus NOT DETECTED NOT DETECTED Final   Coronavirus 229E NOT DETECTED NOT DETECTED Final    Comment: (NOTE) The Coronavirus on the Respiratory Panel, DOES NOT test for the novel  Coronavirus (2019 nCoV)    Coronavirus HKU1 NOT DETECTED NOT DETECTED Final   Coronavirus NL63 NOT DETECTED NOT DETECTED Final   Coronavirus OC43 NOT DETECTED NOT DETECTED Final   Metapneumovirus NOT DETECTED NOT DETECTED Final   Rhinovirus / Enterovirus NOT DETECTED NOT DETECTED Final   Influenza A NOT DETECTED NOT DETECTED Final   Influenza B NOT DETECTED NOT DETECTED Final   Parainfluenza Virus 1 NOT DETECTED NOT DETECTED Final   Parainfluenza Virus 2 NOT DETECTED NOT DETECTED Final   Parainfluenza Virus 3 NOT DETECTED NOT DETECTED Final   Parainfluenza Virus 4 NOT DETECTED NOT DETECTED Final   Respiratory Syncytial Virus NOT DETECTED NOT DETECTED Final   Bordetella pertussis NOT DETECTED NOT DETECTED Final   Bordetella Parapertussis NOT DETECTED NOT DETECTED Final   Chlamydophila pneumoniae NOT DETECTED NOT DETECTED Final   Mycoplasma pneumoniae NOT DETECTED NOT DETECTED Final    Comment: Performed at [facility], [address] [place], [place] [address]  Expectorated Sputum Assessment w Gram Stain, Rflx to Resp Cult     Status: None   Collection Time: 12/02/24  4:33 PM   Specimen:  Expectorated Sputum  Result Value Ref Range Status   Specimen Description EXPECTORATED SPUTUM  Final   Special Requests NONE  Final   Sputum evaluation   Final    THIS SPECIMEN IS ACCEPTABLE FOR SPUTUM CULTURE Performed at [facility], [address], [place], [place] [address]    Report Status 12/02/2024 FINAL  Final  Culture, Respiratory w Gram Stain     Status: None (Preliminary result)   Collection Time: 12/02/24  4:33 PM  Result Value Ref Range Status   Specimen Description   Final    EXPECTORATED SPUTUM Performed at [facility], [address] [place], [place] [address]    Special Requests   Final    NONE Reflexed from [id number] Performed at [facility], [address], [place], [place] [address]    Gram Stain   Final    NO WBC SEEN NO ORGANISMS SEEN Performed at [facility], [address] [place], [place] [address]    Culture PENDING  Incomplete   Report Status PENDING  Incomplete    Radiology Studies: ECHOCARDIOGRAM LIMITED BUBBLE STUDY Result Date: 12/03/2024    ECHOCARDIOGRAM LIMITED REPORT   Patient Name:   Toby Breithaupt. Date of Exam: 12/03/2024 Medical Rec #:  [id number]             Height:       65.0 in Accession #:    [id number]            Weight:       148.6 lb Date of Birth:  18-Feb-1946             BSA:          1.743 m Patient Age:    78 years              BP:           137/52 mmHg Patient Gender: M                     HR:           65 bpm. Exam Location:  Inpatient Procedure: Limited Echo and Saline Contrast Bubble Study (Both Spectral and            Color Flow Doppler were utilized during procedure). Indications:    Hypoxemia  History:        Patient has prior history of Echocardiogram examinations, most                 recent 11/30/2024.  Sonographer:    Tinnie Gosling RDCS Referring Phys: [id number] RAHUL PAWAR IMPRESSIONS  1. Agitated saline contrast bubble study was negative, with no evidence of  any interatrial shunt. FINDINGS  Left Ventricle: IAS/Shunts: Agitated saline contrast was given intravenously to evaluate for intracardiac shunting. Agitated saline contrast bubble study was negative, with no evidence of any interatrial shunt. RIGHT VENTRICLE RV S prime:     9.57 cm/s TAPSE (M-mode): 1.6 cm Jerel Balding MD Electronically signed by Jerel Balding MD Signature Date/Time: 12/03/2024/5:21:09 PM    Final    DG CHEST PORT 1 VIEW Result Date: 12/03/2024 EXAM: 1 VIEW(S) XRAY OF THE CHEST 12/03/2024 05:30:00 AM COMPARISON: 11/30/2024 CLINICAL HISTORY: SOB (shortness of breath) FINDINGS: LUNGS AND PLEURA: Small left pleural effusion.  Increased bibasilar airspace opacities, left greater than right. New left perihilar opacity. No pneumothorax. HEART AND MEDIASTINUM: Atherosclerotic calcifications. No acute abnormality of the cardiac and mediastinal silhouettes. BONES AND SOFT TISSUES: No acute osseous abnormality. IMPRESSION: 1. Increased bibasilar airspace opacities, left greater than right, and new left perihilar opacity. 2. Small left pleural effusion. Electronically signed by: Waddell Calk MD 12/03/2024 07:22 AM EST RP Workstation: [id number]   Scheduled Meds:  albuterol   2.5 mg Nebulization QID   amLODipine   5 mg Oral Daily   arformoterol   15 mcg Nebulization BID   budesonide  (PULMICORT ) nebulizer solution  0.25 mg Nebulization BID   Chlorhexidine  Gluconate Cloth  6 each Topical Daily   enoxaparin  (LOVENOX ) injection  70 mg Subcutaneous Q12H   mouth rinse  15 mL Mouth Rinse 4 times per day   polyethylene glycol  17 g Oral BID   potassium chloride   40 mEq Oral Once   revefenacin   175 mcg Nebulization Daily   senna-docusate  1 tablet Oral BID   sodium chloride  HYPERTONIC  4 mL Nebulization Q4H   Continuous Infusions:  ceFEPime  (MAXIPIME ) IV Stopped (12/03/24 1053)   dextrose  5% lactated ringers  75 mL/hr at 12/03/24 1800    LOS: 4 days   Alejandro Marker, DO [facility] Available via Epic secure chat 7am-7pm After these hours, please refer to coverage provider listed on [email or web address] 12/03/2024, 9:50 PM

## 2024-12-03 NOTE — TOC Progression Note (Signed)
 Transition of Care (TOC) - Progression Note    Patient Details  Name: Dean [name]. MRN: [id number] Date of Birth: [birth year]  [facility]) CM/SW Contact  Jon ONEIDA Anon, RN Phone Number: 12/03/2024, 10:18 AM  Clinical Narrative:    ICM acknowledges PT/OT rec for SNF placement at discharge. Pt not medically ready for DC, as pt is requiring an increased O2 liter flow. ICM will continue to follow for medical readiness and initiate SNF workup at that time.      Expected Discharge Plan: Home w Home Health Services Barriers to Discharge: Continued Medical Work up               Expected Discharge Plan and Services In-house Referral: Chaplain Discharge Planning Services: CM Consult Post Acute Care Choice: Home Health Living arrangements for the past 2 months: Single Family Home                 DME Arranged: N/A DME Agency: NA       HH Arranged: NA HH Agency: NA         Social Drivers of Health (SDOH) Interventions SDOH Screenings   Food Insecurity: No Food Insecurity (11/29/2024)  Housing: Low Risk (11/29/2024)  Transportation Needs: Unmet Transportation Needs (11/29/2024)  Utilities: Not At Risk (11/29/2024)  Social Connections: Socially Isolated (11/29/2024)  Tobacco Use: High Risk (11/29/2024)    Readmission Risk Interventions    12/01/2024    1:27 PM  Readmission Risk Prevention Plan  Transportation Screening Complete  PCP or Specialist Appt within 5-7 Days Complete  Home Care Screening Complete  Medication Review (RN CM) Complete

## 2024-12-04 DIAGNOSIS — Z515 Encounter for palliative care: Secondary | ICD-10-CM | POA: Diagnosis not present

## 2024-12-04 DIAGNOSIS — Z711 Person with feared health complaint in whom no diagnosis is made: Secondary | ICD-10-CM

## 2024-12-04 DIAGNOSIS — Z7189 Other specified counseling: Secondary | ICD-10-CM

## 2024-12-04 DIAGNOSIS — J9601 Acute respiratory failure with hypoxia: Secondary | ICD-10-CM

## 2024-12-04 DIAGNOSIS — G4733 Obstructive sleep apnea (adult) (pediatric): Secondary | ICD-10-CM | POA: Diagnosis not present

## 2024-12-04 DIAGNOSIS — I2694 Multiple subsegmental pulmonary emboli without acute cor pulmonale: Secondary | ICD-10-CM | POA: Diagnosis not present

## 2024-12-04 DIAGNOSIS — F17200 Nicotine dependence, unspecified, uncomplicated: Secondary | ICD-10-CM | POA: Diagnosis not present

## 2024-12-04 DIAGNOSIS — R0602 Shortness of breath: Secondary | ICD-10-CM

## 2024-12-04 DIAGNOSIS — Z66 Do not resuscitate: Secondary | ICD-10-CM

## 2024-12-04 DIAGNOSIS — Z79899 Other long term (current) drug therapy: Secondary | ICD-10-CM

## 2024-12-04 LAB — CBC
HCT: 38 % — ABNORMAL LOW (ref 39.0–52.0)
Hemoglobin: 12.1 g/dL — ABNORMAL LOW (ref 13.0–17.0)
MCH: 31.8 pg (ref 26.0–34.0)
MCHC: 31.8 g/dL (ref 30.0–36.0)
MCV: 99.7 fL (ref 80.0–100.0)
Platelets: 187 K/uL (ref 150–400)
RBC: 3.81 MIL/uL — ABNORMAL LOW (ref 4.22–5.81)
RDW: 15.5 % (ref 11.5–15.5)
WBC: 8 K/uL (ref 4.0–10.5)
nRBC: 0 % (ref 0.0–0.2)

## 2024-12-04 LAB — GLUCOSE, CAPILLARY
Glucose-Capillary: 90 mg/dL (ref 70–99)
Glucose-Capillary: 82 mg/dL (ref 70–99)

## 2024-12-04 MED ORDER — HYDROMORPHONE HCL-NACL 50-0.9 MG/50ML-% IV SOLN
0.5000 mg/h | INTRAVENOUS | Status: DC
  Administered 2024-12-04: 16:00:00 6 mg/h via INTRAVENOUS
  Administered 2024-12-04: 11:00:00 1 mg/h via INTRAVENOUS
  Filled 2024-12-04 (×2): qty 50

## 2024-12-04 MED ORDER — SODIUM CHLORIDE 3 % IN NEBU: 4.0000 mL | INHALATION_SOLUTION | Freq: Three times a day (TID) | RESPIRATORY_TRACT | Status: DC

## 2024-12-04 MED ORDER — BIOTENE DRY MOUTH MT LIQD: 15.0000 mL | OROMUCOSAL | Status: DC | PRN

## 2024-12-04 MED ORDER — FUROSEMIDE 10 MG/ML IJ SOLN
40.0000 mg | Freq: Once | INTRAMUSCULAR | Status: AC
  Administered 2024-12-04: 09:00:00 40 mg via INTRAVENOUS
  Filled 2024-12-04: qty 4

## 2024-12-04 MED ORDER — ALBUTEROL SULFATE (2.5 MG/3ML) 0.083% IN NEBU: 2.5000 mg | INHALATION_SOLUTION | Freq: Three times a day (TID) | RESPIRATORY_TRACT | Status: DC

## 2024-12-04 MED ORDER — GLYCOPYRROLATE 0.2 MG/ML IJ SOLN
0.2000 mg | INTRAMUSCULAR | Status: DC | PRN
  Administered 2024-12-04 (×2): 0.2 mg via INTRAVENOUS
  Filled 2024-12-04 (×2): qty 1

## 2024-12-04 MED ORDER — HALOPERIDOL LACTATE 5 MG/ML IJ SOLN: 1.0000 mg | INTRAMUSCULAR | Status: DC | PRN

## 2024-12-04 MED ORDER — POLYVINYL ALCOHOL 1.4 % OP SOLN: 1.0000 [drp] | Freq: Four times a day (QID) | OPHTHALMIC | Status: DC | PRN

## 2024-12-04 MED ORDER — METHYLPREDNISOLONE SODIUM SUCC 40 MG IJ SOLR
40.0000 mg | Freq: Two times a day (BID) | INTRAMUSCULAR | Status: DC
  Administered 2024-12-04: 09:00:00 40 mg via INTRAVENOUS
  Filled 2024-12-04: qty 1

## 2024-12-04 MED ORDER — LORAZEPAM 2 MG/ML IJ SOLN
1.0000 mg | INTRAMUSCULAR | Status: DC | PRN
  Administered 2024-12-04 (×2): 1 mg via INTRAVENOUS
  Filled 2024-12-04 (×2): qty 1

## 2024-12-04 MED ORDER — HYDROMORPHONE BOLUS VIA INFUSION
0.5000 mg | INTRAVENOUS | Status: DC | PRN
  Administered 2024-12-04: 12:00:00 1 mg via INTRAVENOUS
  Administered 2024-12-04: 13:00:00 1.5 mg via INTRAVENOUS
  Administered 2024-12-04: 12:00:00 0.5 mg via INTRAVENOUS
  Administered 2024-12-04 (×3): 2 mg via INTRAVENOUS

## 2024-12-04 MED ORDER — FUROSEMIDE 10 MG/ML IJ SOLN: 40.0000 mg | Freq: Once | INTRAMUSCULAR | Status: DC

## 2024-12-04 MED ORDER — HYDROMORPHONE BOLUS VIA INFUSION
2.0000 mg | INTRAVENOUS | Status: DC | PRN
  Administered 2024-12-04: 19:00:00 3.5 mg via INTRAVENOUS
  Administered 2024-12-04 (×2): 3 mg via INTRAVENOUS

## 2024-12-04 MED ORDER — BISACODYL 10 MG RE SUPP: 10.0000 mg | Freq: Every day | RECTAL | Status: DC | PRN

## 2024-12-05 LAB — PROTHROMBIN GENE MUTATION

## 2024-12-05 LAB — FACTOR 5 LEIDEN

## 2024-12-07 LAB — CULTURE, RESPIRATORY W GRAM STAIN: Gram Stain: NONE SEEN

## 2024-12-19 NOTE — Discharge Summary (Signed)
 " Physician Discharge Summary   Patient: Dean [name]. MRN: [id number] DOB: 1946-08-09  Admit date:     11/29/2024  Discharge date: 12-17-2024  Discharge Physician: True Atlas   PCP: Shepard Ade, MD     Discharge Diagnoses: Principal Problem:   Pulmonary embolism [facility]) Active Problems:   Hyperlipidemia   TOBACCO USER   Obstructive sleep apnea   Essential hypertension   Coronary atherosclerosis   COPD mixed type (HCC)   GERD   PAD (peripheral artery disease)   Aortic atherosclerosis   Pressure injury of skin   Elevated brain natriuretic peptide (BNP) level   Elevated troponin   Palliative care by specialist   Counseling and coordination of care   Goals of care, counseling/discussion   DNR (do not resuscitate)   Concern about end of life   High risk medication use   Shortness of breath   Medication management   ACP (advance care planning)  Resolved Problems:   * No resolved hospital problems. *   Brief hospital course: This is a 79 year old male with COPD, CAD, chronic kidney disease, history of melanoma history of TB, OSA, PAD who presents to the hospital for generalized weakness and dyspnea.  He was found to have bilateral deep PEs.  During his hospital stay, his hypoxia worsened to a point where he required 60% FiO2.  Repeat CT scan suggested a pulmonary infarct.  Repeat chest x-ray revealed pulmonary infiltrates.  He was started on IV antibiotics and IV steroids however has not improved. Respiratory status progressively declined as did his mental status. I have spoken extensively with his sister today who states that the patient's baseline medical condition was quite poor.  She stated that despite the fact that he was living alone, he was very poorly mobile, spent most of the day watching TV in his chair and continued to smoke.  She felt that he had been getting progressively weaker at home and that his quality of life was poor.  As he had continued to decline  in respiratory status during the hospital stay, palliative care was consulted on 12/01/2024.  Today the patient's sister and brother decided to transition him to comfort care based on poor prognosis with previoulsy poor quality of life. The patient expired on December 17, 2024 at 2046 PM.      Discharge Exam: Filed Weights   11/29/24 1314  Weight: 67.4 kg     The results of significant diagnostics from this hospitalization (including imaging, microbiology, ancillary and laboratory) are listed below for reference.   Imaging Studies: ECHOCARDIOGRAM LIMITED BUBBLE STUDY Result Date: 12/03/2024    ECHOCARDIOGRAM LIMITED REPORT   Patient Name:   Dean [name]. Date of Exam: 12/03/2024 Medical Rec #:  [id number]             Height:       65.0 in Accession #:    [id number]            Weight:       148.6 lb Date of Birth:  09-28-1946             BSA:          1.743 m Patient Age:    78 years              BP:           137/52 mmHg Patient Gender: M  HR:           65 bpm. Exam Location:  Inpatient Procedure: Limited Echo and Saline Contrast Bubble Study (Both Spectral and            Color Flow Doppler were utilized during procedure). Indications:    Hypoxemia  History:        Patient has prior history of Echocardiogram examinations, most                 recent 11/30/2024.  Sonographer:    Tinnie Gosling RDCS Referring Phys: [id number] RAHUL PAWAR IMPRESSIONS  1. Agitated saline contrast bubble study was negative, with no evidence of any interatrial shunt. FINDINGS  Left Ventricle: IAS/Shunts: Agitated saline contrast was given intravenously to evaluate for intracardiac shunting. Agitated saline contrast bubble study was negative, with no evidence of any interatrial shunt. RIGHT VENTRICLE RV S prime:     9.57 cm/s TAPSE (M-mode): 1.6 cm Jerel Balding MD Electronically signed by Jerel Balding MD Signature Date/Time: 12/03/2024/5:21:09 PM    Final    DG CHEST PORT 1 VIEW Result Date:  12/03/2024 EXAM: 1 VIEW(S) XRAY OF THE CHEST 12/03/2024 05:30:00 AM COMPARISON: 11/30/2024 CLINICAL HISTORY: SOB (shortness of breath) FINDINGS: LUNGS AND PLEURA: Small left pleural effusion. Increased bibasilar airspace opacities, left greater than right. New left perihilar opacity. No pneumothorax. HEART AND MEDIASTINUM: Atherosclerotic calcifications. No acute abnormality of the cardiac and mediastinal silhouettes. BONES AND SOFT TISSUES: No acute osseous abnormality. IMPRESSION: 1. Increased bibasilar airspace opacities, left greater than right, and new left perihilar opacity. 2. Small left pleural effusion. Electronically signed by: Waddell Calk MD 12/03/2024 07:22 AM EST RP Workstation: [id number]   CT Angio Chest Pulmonary Embolism (PE) W or WO Contrast Result Date: 11/30/2024 EXAM: CTA of the Chest with contrast for PE 11/30/2024 01:22:29 PM TECHNIQUE: CTA of the chest was performed after the administration of 60 mL of iohexol  (OMNIPAQUE ) 350 MG/ML injection. Multiplanar reformatted images are provided for review. MIP images are provided for review. Automated exposure control, iterative reconstruction, and/or weight based adjustment of the mA/kV was utilized to reduce the radiation dose to as low as reasonably achievable. COMPARISON: None available. CLINICAL HISTORY: Pulmonary embolism (PE) suspected, high prob. FINDINGS: PULMONARY ARTERIES: Pulmonary arteries are adequately opacified for evaluation. Stable bilateral central pulmonary emboli, left greater than right extending to the segmental and subsegmental levels of the left lower, left upper, right middle, right lower lobes. Main pulmonary artery is normal in caliber. MEDIASTINUM: The heart demonstrates a normal RV to LV ratio. There is 4-vessel coronary artery calcification and severe atherosclerotic plaque. The pericardium demonstrates no acute abnormality. There is no acute abnormality of the thoracic aorta. LYMPH NODES: No mediastinal, hilar or  axillary lymphadenopathy. LUNGS AND PLEURA: Interval development of associated bubbly left lower lobe consolidation that likely represents developing pulmonary infarction. No pleural effusion or pneumothorax. UPPER ABDOMEN: Limited images of the upper abdomen demonstrate status post cholecystectomy. Lesions within the left kidney likely represent simple renal cysts. Simple renal cysts do not require additional follow-up unless clinically indicated due to signs/symptoms. SOFT TISSUES AND BONES: No acute bone or soft tissue abnormality. IMPRESSION: 1. Stable bilateral central pulmonary emboli, left greater than right, extending to the segmental and subsegmental levels of the left lower, left upper, right middle, and right lower lobes. 2. Interval development of bubbly left lower lobe consolidation, likely representing developing pulmonary infarction. 3. No right heart strain. Electronically signed by: Morgane Naveau MD 11/30/2024 01:40 PM EST RP Workstation: [id number]  ECHOCARDIOGRAM COMPLETE Result Date: 11/30/2024    ECHOCARDIOGRAM REPORT   Patient Name:   Dean [name]. Date of Exam: 11/30/2024 Medical Rec #:  [id number]             Height:       65.0 in Accession #:    [id number]            Weight:       148.6 lb Date of Birth:  08/22/1946             BSA:          1.743 m Patient Age:    78 years              BP:           183/65 mmHg Patient Gender: M                     HR:           70 bpm. Exam Location:  Inpatient Procedure: 2D Echo, Cardiac Doppler and Color Doppler (Both Spectral and Color            Flow Doppler were utilized during procedure). Indications:    Pulmonary Embolus                 Elevated Troponin  History:        Patient has prior history of Echocardiogram examinations, most                 recent 11/25/2022. CAD, Prior CABG, COPD and PAD, Arrythmias:PVC,                 Signs/Symptoms:Dyspnea; Risk Factors:Hypertension, Dyslipidemia,                 Sleep Apnea and Current  Smoker. Abdominal Aortic Aneurysm.  Sonographer:    Logan Shove RDCS Referring Phys: [id number] DAVID MANUEL ORTIZ IMPRESSIONS  1. Left ventricular ejection fraction, by estimation, is 60 to 65%. The left ventricle has normal function. The left ventricle has no regional wall motion abnormalities. There is moderate concentric left ventricular hypertrophy. Left ventricular diastolic parameters are consistent with Grade I diastolic dysfunction (impaired relaxation).  2. Right ventricular systolic function is normal. The right ventricular size is normal.  3. Left atrial size was mildly dilated.  4. Right atrial size was mildly dilated.  5. The mitral valve is normal in structure. Trivial mitral valve regurgitation. No evidence of mitral stenosis.  6. The aortic valve is tricuspid. Aortic valve regurgitation is not visualized. Aortic valve sclerosis is present, with no evidence of aortic valve stenosis.  7. The inferior vena cava is normal in size with greater than 50% respiratory variability, suggesting right atrial pressure of 3 mmHg. FINDINGS  Left Ventricle: Left ventricular ejection fraction, by estimation, is 60 to 65%. The left ventricle has normal function. The left ventricle has no regional wall motion abnormalities. The left ventricular internal cavity size was normal in size. There is  moderate concentric left ventricular hypertrophy. Left ventricular diastolic parameters are consistent with Grade I diastolic dysfunction (impaired relaxation). Right Ventricle: The right ventricular size is normal. Right ventricular systolic function is normal. Left Atrium: Left atrial size was mildly dilated. Right Atrium: Right atrial size was mildly dilated. Pericardium: There is no evidence of pericardial effusion. Mitral Valve: The mitral valve is normal in structure. Mild mitral annular calcification. Trivial mitral valve regurgitation. No evidence of mitral valve stenosis. Tricuspid Valve: The  tricuspid valve is normal in  structure. Tricuspid valve regurgitation is trivial. No evidence of tricuspid stenosis. Aortic Valve: The aortic valve is tricuspid. Aortic valve regurgitation is not visualized. Aortic valve sclerosis is present, with no evidence of aortic valve stenosis. Aortic valve peak gradient measures 7.0 mmHg. Pulmonic Valve: The pulmonic valve was normal in structure. Pulmonic valve regurgitation is not visualized. No evidence of pulmonic stenosis. Aorta: The aortic root is normal in size and structure. Venous: The inferior vena cava is normal in size with greater than 50% respiratory variability, suggesting right atrial pressure of 3 mmHg. IAS/Shunts: No atrial level shunt detected by color flow Doppler.  LEFT VENTRICLE PLAX 2D LVIDd:         4.70 cm      Diastology LVIDs:         2.60 cm      LV e' medial:   2.72 cm/s LV PW:         1.30 cm      LV E/e' medial: 24.7 LV IVS:        1.20 cm LVOT diam:     2.10 cm LVOT Area:     3.46 cm  LV Volumes (MOD) LV vol d, MOD A2C: 144.0 ml LV vol d, MOD A4C: 137.0 ml LV vol s, MOD A2C: 51.5 ml LV vol s, MOD A4C: 48.5 ml LV SV MOD A2C:     92.5 ml LV SV MOD A4C:     137.0 ml LV SV MOD BP:      94.3 ml RIGHT VENTRICLE             IVC RV Basal diam:  3.70 cm     IVC diam: 1.90 cm RV S prime:     16.73 cm/s TAPSE (M-mode): 2.8 cm      PULMONARY VEINS                             Systolic Velocity: 2.61 cm/s LEFT ATRIUM             Index        RIGHT ATRIUM           Index LA diam:        2.70 cm 1.55 cm/m   RA Area:     21.80 cm LA Vol (A2C):   88.3 ml 50.65 ml/m  RA Volume:   76.30 ml  43.76 ml/m LA Vol (A4C):   65.2 ml 37.40 ml/m LA Biplane Vol: 78.2 ml 44.85 ml/m  AORTIC VALVE AV Area (Vmax): 3.05 cm AV Vmax:        132.67 cm/s AV Peak Grad:   7.0 mmHg LVOT Vmax:      117.00 cm/s  AORTA Ao Root diam: 3.20 cm Ao Asc diam:  3.70 cm MITRAL VALVE MV Area (PHT): 3.43 cm     SHUNTS MV Decel Time: 221 msec     Systemic Diam: 2.10 cm MV E velocity: 67.10 cm/s MV A velocity: 104.00  cm/s MV E/A ratio:  0.65 Redell Shallow MD Electronically signed by Redell Shallow MD Signature Date/Time: 11/30/2024/11:25:03 AM    Final    VAS US  LOWER EXTREMITY VENOUS (DVT) Result Date: 11/30/2024  Lower Venous DVT Study Patient Name:  Dean [name].  Date of Exam:   11/29/2024 Medical Rec #: [id number]              Accession #:  [id number] Date of Birth: 01/04/1946              Patient Gender: M Patient Age:   66 years Exam Location:  [facility] Procedure:      VAS US  LOWER EXTREMITY VENOUS (DVT) Referring Phys: DAVID ORTIZ --------------------------------------------------------------------------------  Indications: Pulmonary embolism.  Comparison Study: Prior negative bilateral LEV done 01/14/21 Performing Technologist: Alberta Lis RVS  Examination Guidelines: A complete evaluation includes B-mode imaging, spectral Doppler, color Doppler, and power Doppler as needed of all accessible portions of each vessel. Bilateral testing is considered an integral part of a complete examination. Limited examinations for reoccurring indications may be performed as noted. The reflux portion of the exam is performed with the patient in reverse Trendelenburg.  +---------+---------------+---------+-----------+----------+--------------+ RIGHT    CompressibilityPhasicitySpontaneityPropertiesThrombus Aging +---------+---------------+---------+-----------+----------+--------------+ CFV      Full           Yes      Yes                                 +---------+---------------+---------+-----------+----------+--------------+ SFJ      Full                                                        +---------+---------------+---------+-----------+----------+--------------+ FV Prox  Full           Yes      No                                  +---------+---------------+---------+-----------+----------+--------------+ FV Mid   Full                                                         +---------+---------------+---------+-----------+----------+--------------+ FV DistalFull           Yes      Yes                                 +---------+---------------+---------+-----------+----------+--------------+ PFV      Full           Yes      Yes                                 +---------+---------------+---------+-----------+----------+--------------+ POP                     Yes      Yes                                 +---------+---------------+---------+-----------+----------+--------------+ PTV      None                                         Acute          +---------+---------------+---------+-----------+----------+--------------+  PERO     None                                         Acute          +---------+---------------+---------+-----------+----------+--------------+   +---------+---------------+---------+-----------+----------+-------------------+ LEFT     CompressibilityPhasicitySpontaneityPropertiesThrombus Aging      +---------+---------------+---------+-----------+----------+-------------------+ CFV      Full           Yes      Yes                                      +---------+---------------+---------+-----------+----------+-------------------+ SFJ      Full                                                             +---------+---------------+---------+-----------+----------+-------------------+ FV Prox  Partial        Yes      No                   acute, poorly                                                             adhered             +---------+---------------+---------+-----------+----------+-------------------+ FV Mid   Partial                                      acute, poorly                                                             adhered             +---------+---------------+---------+-----------+----------+-------------------+ FV DistalFull           Yes      No                                        +---------+---------------+---------+-----------+----------+-------------------+ PFV      Full           Yes      No                                       +---------+---------------+---------+-----------+----------+-------------------+ POP      Full           Yes      No                                       +---------+---------------+---------+-----------+----------+-------------------+  PTV      None                                         Acute               +---------+---------------+---------+-----------+----------+-------------------+ PERO     None                                         Acute               +---------+---------------+---------+-----------+----------+-------------------+ Gastroc  Full                                                             +---------+---------------+---------+-----------+----------+-------------------+ EIV                     Yes      No                   patent by color and                                                       Doppler             +---------+---------------+---------+-----------+----------+-------------------+     Summary: RIGHT: - Findings consistent with acute deep vein thrombosis involving the right posterior tibial veins, right peroneal veins, and right femoral vein.  - A cystic structure is found in the popliteal fossa.  LEFT: - Findings consistent with acute deep vein thrombosis involving the left femoral vein, left posterior tibial veins, and left peroneal veins.  - No cystic structure found in the popliteal fossa.  *See table(s) above for measurements and observations. Electronically signed by Gaile New MD on 11/30/2024 at 10:50:28 AM.    Final    DG CHEST PORT 1 VIEW Result Date: 11/30/2024 EXAM: 1 VIEW(S) XRAY OF THE CHEST 11/30/2024 10:02:00 AM COMPARISON: 11/29/2024 CLINICAL HISTORY: 79 year old male with acute pulmonary emboli. Hypoxia FINDINGS: LUNGS AND PLEURA:  Emphysema. Increasing left basilar opacity. Possible small left pleural effusion. No pneumothorax. HEART AND MEDIASTINUM: Atherosclerotic calcifications. No acute abnormality of the cardiac and mediastinal silhouettes. BONES AND SOFT TISSUES: No acute osseous abnormality. IMPRESSION: 1. Increasing left basilar opacity, suspicious for infarction in this clinical setting of acute pulmonary emboli. 2. Possible small left pleural effusion. 3. Emphysema. Electronically signed by: Helayne Hurst MD 11/30/2024 10:47 AM EST RP Workstation: [id number]   CT Angio Chest PE W and/or Wo Contrast Result Date: 11/29/2024 CLINICAL DATA:  Pulmonary embolism EXAM: CT ANGIOGRAPHY CHEST WITH CONTRAST TECHNIQUE: Multidetector CT imaging of the chest was performed using the standard protocol during bolus administration of intravenous contrast. Multiplanar CT image reconstructions and MIPs were obtained to evaluate the vascular anatomy. RADIATION DOSE REDUCTION: This exam was performed according to the departmental dose-optimization program which includes automated exposure control, adjustment of the mA and/or kV according to patient size and/or use of iterative  reconstruction technique. CONTRAST:  60mL OMNIPAQUE  IOHEXOL  350 MG/ML SOLN COMPARISON:  March 06, 2024 FINDINGS: Cardiovascular: Large pulmonary embolus is noted in left pulmonary artery which extends into upper and lower lobe branches. Smaller pulmonary embolus is noted in lower lobe branches of right pulmonary artery. RV/LV ratio of 0.9 is noted which is within normal limits. Coronary artery calcifications are noted. Aortic calcifications are noted. Mediastinum/Nodes: No enlarged mediastinal, hilar, or axillary lymph nodes. Thyroid gland, trachea, and esophagus demonstrate no significant findings. Lungs/Pleura: Emphysematous disease is noted. No pneumothorax or pleural effusion. Upper Abdomen: No acute abnormality. Musculoskeletal: No chest wall abnormality. No acute or  significant osseous findings. Review of the MIP images confirms the above findings. IMPRESSION: 1. Large pulmonary embolus is noted in left pulmonary artery which extends into upper and lower lobe branches. Smaller pulmonary embolus is noted in lower lobe branches of right pulmonary artery. Critical Value/emergent results were called by telephone at the time of interpretation on 11/29/2024 at 11:17 am to provider [facility] , who verbally acknowledged these results. 2. Coronary artery calcifications are noted suggesting coronary artery disease. Aortic Atherosclerosis (ICD10-I70.0) and Emphysema (ICD10-J43.9). Electronically Signed   By: Lynwood Landy Raddle M.D.   On: 11/29/2024 11:17   DG Chest 2 View Result Date: 11/29/2024 CLINICAL DATA:  Shortness of breath. EXAM: CHEST - 2 VIEW COMPARISON:  12/11/2023 FINDINGS: The cardio pericardial silhouette is enlarged. Interstitial coarsening is similar to prior. No dense focal consolidative airspace opacity. No overt airspace pulmonary edema. No pleural effusion. Lungs are hyperexpanded. Telemetry leads overlie the chest. IMPRESSION: Enlargement of the cardiopericardial silhouette with chronic interstitial prominence. Electronically Signed   By: Camellia Candle M.D.   On: 11/29/2024 08:18    Microbiology: Results for orders placed or performed during the hospital encounter of 11/29/24  Resp panel by RT-PCR (RSV, Flu A&B, Covid) Anterior Nasal Swab     Status: None   Collection Time: 11/29/24  6:53 AM   Specimen: Anterior Nasal Swab  Result Value Ref Range Status   SARS Coronavirus 2 by RT PCR NEGATIVE NEGATIVE Final    Comment: (NOTE) SARS-CoV-2 target nucleic acids are NOT DETECTED.  The SARS-CoV-2 RNA is generally detectable in upper respiratory specimens during the acute phase of infection. The lowest concentration of SARS-CoV-2 viral copies this assay can detect is 138 copies/mL. A negative result does not preclude SARS-Cov-2 infection and should not  be used as the sole basis for treatment or other patient management decisions. A negative result may occur with  improper specimen collection/handling, submission of specimen other than nasopharyngeal swab, presence of viral mutation(s) within the areas targeted by this assay, and inadequate number of viral copies(<138 copies/mL). A negative result must be combined with clinical observations, patient history, and epidemiological information. The expected result is Negative.  Fact Sheet for Patients:  bloggercourse.com  Fact Sheet for Healthcare Providers:  seriousbroker.it  This test is no t yet approved or cleared by the [place]  FDA and  has been authorized for detection and/or diagnosis of SARS-CoV-2 by FDA under an Emergency Use Authorization (EUA). This EUA will remain  in effect (meaning this test can be used) for the duration of the COVID-19 declaration under Section 564(b)(1) of the Act, 21 U.S.C.section 360bbb-3(b)(1), unless the authorization is terminated  or revoked sooner.       Influenza A by PCR NEGATIVE NEGATIVE Final   Influenza B by PCR NEGATIVE NEGATIVE Final    Comment: (NOTE) The Xpert Xpress SARS-CoV-2/FLU/RSV plus assay  is intended as an aid in the diagnosis of influenza from Nasopharyngeal swab specimens and should not be used as a sole basis for treatment. Nasal washings and aspirates are unacceptable for Xpert Xpress SARS-CoV-2/FLU/RSV testing.  Fact Sheet for Patients: bloggercourse.com  Fact Sheet for Healthcare Providers: seriousbroker.it  This test is not yet approved or cleared by the [place]  FDA and has been authorized for detection and/or diagnosis of SARS-CoV-2 by FDA under an Emergency Use Authorization (EUA). This EUA will remain in effect (meaning this test can be used) for the duration of the COVID-19 declaration under Section  564(b)(1) of the Act, 21 U.S.C. section 360bbb-3(b)(1), unless the authorization is terminated or revoked.     Resp Syncytial Virus by PCR NEGATIVE NEGATIVE Final    Comment: (NOTE) Fact Sheet for Patients: bloggercourse.com  Fact Sheet for Healthcare Providers: seriousbroker.it  This test is not yet approved or cleared by the [place]  FDA and has been authorized for detection and/or diagnosis of SARS-CoV-2 by FDA under an Emergency Use Authorization (EUA). This EUA will remain in effect (meaning this test can be used) for the duration of the COVID-19 declaration under Section 564(b)(1) of the Act, 21 U.S.C. section 360bbb-3(b)(1), unless the authorization is terminated or revoked.  Performed at [facility], [address], [place], [place] [address]   MRSA Next Gen by PCR, Nasal     Status: None   Collection Time: 11/29/24  1:27 PM   Specimen: Nasal Mucosa; Nasal Swab  Result Value Ref Range Status   MRSA by PCR Next Gen NOT DETECTED NOT DETECTED Final    Comment: (NOTE) The GeneXpert MRSA Assay (FDA approved for NASAL specimens only), is one component of a comprehensive MRSA colonization surveillance program. It is not intended to diagnose MRSA infection nor to guide or monitor treatment for MRSA infections. Test performance is not FDA approved in patients less than 67 years old. Performed at [facility], [address], [place], [place] [address]   Respiratory (~20 pathogens) panel by PCR     Status: None   Collection Time: 12/02/24  2:11 PM   Specimen: Nasopharyngeal Swab; Respiratory  Result Value Ref Range Status   Adenovirus NOT DETECTED NOT DETECTED Final   Coronavirus 229E NOT DETECTED NOT DETECTED Final    Comment: (NOTE) The Coronavirus on the Respiratory Panel, DOES NOT test for the novel  Coronavirus (2019 nCoV)    Coronavirus HKU1 NOT DETECTED NOT DETECTED Final    Coronavirus NL63 NOT DETECTED NOT DETECTED Final   Coronavirus OC43 NOT DETECTED NOT DETECTED Final   Metapneumovirus NOT DETECTED NOT DETECTED Final   Rhinovirus / Enterovirus NOT DETECTED NOT DETECTED Final   Influenza A NOT DETECTED NOT DETECTED Final   Influenza B NOT DETECTED NOT DETECTED Final   Parainfluenza Virus 1 NOT DETECTED NOT DETECTED Final   Parainfluenza Virus 2 NOT DETECTED NOT DETECTED Final   Parainfluenza Virus 3 NOT DETECTED NOT DETECTED Final   Parainfluenza Virus 4 NOT DETECTED NOT DETECTED Final   Respiratory Syncytial Virus NOT DETECTED NOT DETECTED Final   Bordetella pertussis NOT DETECTED NOT DETECTED Final   Bordetella Parapertussis NOT DETECTED NOT DETECTED Final   Chlamydophila pneumoniae NOT DETECTED NOT DETECTED Final   Mycoplasma pneumoniae NOT DETECTED NOT DETECTED Final    Comment: Performed at [facility], [address] [place], [place] [address]  Expectorated Sputum Assessment w Gram Stain, Rflx to Resp Cult     Status:  None   Collection Time: 12/02/24  4:33 PM   Specimen: Expectorated Sputum  Result Value Ref Range Status   Specimen Description EXPECTORATED SPUTUM  Final   Special Requests NONE  Final   Sputum evaluation   Final    THIS SPECIMEN IS ACCEPTABLE FOR SPUTUM CULTURE Performed at [facility], [address] [place], [place] [address]    Report Status 12/02/2024 FINAL  Final  Culture, Respiratory w Gram Stain     Status: None (Preliminary result)   Collection Time: 12/02/24  4:33 PM  Result Value Ref Range Status   Specimen Description   Final    EXPECTORATED SPUTUM Performed at [facility], [address], [place], [place] [address]    Special Requests   Final    NONE Reflexed from [id number] Performed at [facility], [address] [place], [place] [address]    Gram Stain NO WBC SEEN NO ORGANISMS SEEN   Final   Culture   Final    FEW PROVIDENCIA  RETTGERI FEW SERRATIA MARCESCENS SUSCEPTIBILITIES TO FOLLOW Performed at [facility], [address] [place], [place] [address]    Report Status PENDING  Incomplete   Organism ID, Bacteria PROVIDENCIA RETTGERI  Final      Susceptibility   Providencia rettgeri - MIC*    AMPICILLIN >=32 RESISTANT Resistant     CEFEPIME  2 SENSITIVE Sensitive     ERTAPENEM <=0.12 SENSITIVE Sensitive     CEFTRIAXONE <=0.25 SENSITIVE Sensitive     CIPROFLOXACIN  <=0.06 SENSITIVE Sensitive     GENTAMICIN  <=1 SENSITIVE Sensitive     MEROPENEM 1 SENSITIVE Sensitive     TRIMETH /SULFA  <=20 SENSITIVE Sensitive     AMPICILLIN/SULBACTAM >=32 RESISTANT Resistant     PIP/TAZO Value in next row Resistant      >=128 RESISTANTThis is a modified FDA-approved test that has been validated and its performance characteristics determined by the reporting laboratory.  This laboratory is certified under the Clinical Laboratory Improvement Amendments CLIA as qualified to perform high complexity clinical laboratory testing.    * FEW PROVIDENCIA RETTGERI    Labs: CBC: Recent Labs  Lab 11/30/24 0249 12/01/24 0304 12/02/24 0302 12/03/24 0306 12-09-24 0302  WBC 8.9 9.6 10.4 9.0 8.0  NEUTROABS  --  7.6 8.4* 7.1  --   HGB 13.6 13.7 12.3* 11.7* 12.1*  HCT 42.4 43.5 38.5* 36.8* 38.0*  MCV 96.1 97.8 98.0 99.2 99.7  PLT 187 212 186 165 187   Basic Metabolic Panel: Recent Labs  Lab 11/30/24 0249 11/30/24 0810 12/01/24 0304 12/02/24 0302 12/03/24 0306  NA 143  --  143 143 143  K 2.9*  --  3.5 3.7 4.0  CL 101  --  103 106 106  CO2 28  --  27 29 26   GLUCOSE 69*  --  111* 99 71  BUN 16  --  18 19 22   CREATININE 1.53*  --  1.85* 1.61* 1.59*  CALCIUM  8.3*  --  8.5* 8.4* 8.5*  MG  --  2.3 2.3 2.1 2.1  PHOS  --  3.5 3.7 2.8 3.1   Liver Function Tests: Recent Labs  Lab 11/30/24 0249 12/01/24 0304 12/02/24 0302 12/03/24 0306  AST 26 23 20 22   ALT 7 6 6 8   ALKPHOS 109 108 91 94  BILITOT 0.6 0.6 0.6 0.5  PROT  6.8 6.7 6.2* 6.2*  ALBUMIN 3.3* 3.2* 2.9* 3.0*   CBG: Recent Labs  Lab 12/03/24  1518 12/03/24 1937 12/03/24 2351 01/03/25 0353 2025/01/03 0750  GLUCAP 71 87 91 90 82    Discharge time spent: less than 30 minutes.  Signed: Cherylanne Ardelean, MD [facility] 12/06/2024 "

## 2024-12-19 NOTE — Progress Notes (Signed)
° ° °  Patient Name: Dean [name].           DOB: [birth year]  MRN: [id number]       OVERNIGHT EVENT    Notified by RN that patient has expired at 2046.  2 RN verified. Patient was comfort care.   Family is at bedside.     Alejo Beamer, DNP, ACNPC- AG [facility] [place]

## 2024-12-19 NOTE — Progress Notes (Signed)
 [facility] Progress Note  Patient: Dean [name].     FMW:[id number]  DOA: 11/29/2024   PCP: Shepard Ade, MD       Brief hospital course: This is a 79 year old male with COPD, CAD, chronic kidney disease, history of melanoma history of TB, OSA, PAD who presents to the hospital for generalized weakness and dyspnea.  He was found to have bilateral deep PEs.  During his hospital stay, his hypoxia worsened to a point where he required 60% FiO2.  Repeat CT scan suggested a pulmonary infarct.  Repeat chest x-ray revealed pulmonary infiltrates.  He was started on IV antibiotics and IV steroids however has not improved.  Subjective:  Very somnolent.  Difficult to comprehend.  Assessment and Plan: Principal Problem:   Pulmonary embolism (HCC) Active Problems:   Hyperlipidemia   TOBACCO USER   Obstructive sleep apnea   Essential hypertension   Coronary atherosclerosis   COPD mixed type (HCC)   GERD   PAD (peripheral artery disease)   Aortic atherosclerosis   Pressure injury of skin   Elevated brain natriuretic peptide (BNP) level   Elevated troponin   Palliative care by specialist   Counseling and coordination of care   Goals of care, counseling/discussion  I have spoken extensively with his sister today who states that the patient's baseline medical condition was quite poor.  She stated that despite the fact that he was living alone, he was very poorly mobile, spent most of the day watching TV in his chair and continued to smoke.  She felt that he had been getting progressively weaker at home and that his quality of life was poor.  As he has continued to decline in respiratory status during the hospital stay, palliative care was consulted on 12/01/2024.  Today the patient's sister and brother have decided to transition him to comfort care based on poor prognosis with poor underlying quality of life.  Antibiotics have been discontinued and oxygen  will be weaned.  We  expect that he will expire while in the hospital.      Code Status: Do not attempt resuscitation (DNR) - Comfort care Total time on patient care: 35 minutes  Objective:   Vitals:   12-26-24 1100 12/26/2024 1113 26-Dec-2024 1200 12/26/24 1218  BP:      Pulse: 60 (!) 54 60 80  Resp: 13 16 11 19   Temp:      TempSrc:      SpO2: (!) 86% (!) 88% (!) 68% (!) 78%  Weight:      Height:       Filed Weights   11/29/24 1314  Weight: 67.4 kg   Exam: Somnolent, bilateral rhonchi on auscultation   CBC: Recent Labs  Lab 11/29/24 0701 11/30/24 0249 12/01/24 0304 12/02/24 0302 12/03/24 0306 12-26-2024 0302  WBC 8.6 8.9 9.6 10.4 9.0 8.0  NEUTROABS 6.5  --  7.6 8.4* 7.1  --   HGB 14.5 13.6 13.7 12.3* 11.7* 12.1*  HCT 44.6 42.4 43.5 38.5* 36.8* 38.0*  MCV 97.2 96.1 97.8 98.0 99.2 99.7  PLT 202 187 212 186 165 187   Basic Metabolic Panel: Recent Labs  Lab 11/29/24 0701 11/29/24 0925 11/30/24 0249 11/30/24 0810 12/01/24 0304 12/02/24 0302 12/03/24 0306  NA 146*  --  143  --  143 143 143  K 2.9*  --  2.9*  --  3.5 3.7 4.0  CL 101  --  101  --  103 106 106  CO2 29  --  28  --  27 29 26   GLUCOSE 72  --  69*  --  111* 99 71  BUN 15  --  16  --  18 19 22   CREATININE 1.74*  --  1.53*  --  1.85* 1.61* 1.59*  CALCIUM  8.9  --  8.3*  --  8.5* 8.4* 8.5*  MG  --  1.8  --  2.3 2.3 2.1 2.1  PHOS  --  3.4  --  3.5 3.7 2.8 3.1     Scheduled Meds:  mouth rinse  15 mL Mouth Rinse 4 times per day    Imaging and lab data personally reviewed   Author: [employer]  December 20, 2024 1:43 PM  To contact [facility]>   Check the care team in [facility] and look for the attending/consulting [facility] provider listed  Log into [email or web address] and use [place]'s universal password   Go to> [facility]  and find provider  If you still have difficulty reaching the provider, please page the [facility] (Director on Call) for the Hospitalists listed on amion

## 2024-12-19 NOTE — Progress Notes (Signed)
 SPIRITUAL CARE AND COUNSELING CONSULT NOTE   VISIT SUMMARY Visited Dean [name], sister Dean [name], and brother Dean [name] per family's request for chaplain presence as sister had been planning to return to [place]  for another commitment. (As she watches Dean [name]'s decline, she may be deciding to stay at his bedside instead.) Provided pastoral presence and supportive, reflective conversation. Brought handmade quilt as a tangible sign of comfort and support.   SPIRITUAL ENCOUNTER                                                                                                                                                                      Type of Visit: Follow up Care provided to:: Pt and family (sister Dean [name] (who is a audiological scientist) and brother Dean [name]) Psychologist, [employer] partners present during encounter: Nurse Referral source: Family Reason for visit: End-of-life OnCall Visit: No  SPIRITUAL FRAMEWORK  Presenting Themes: Impactful experiences and emotions, Courage hope and growth, Values and beliefs Values/beliefs: Sister Dean [name] is very familiar with end-of-life care and sees this time as sacred, a transition similar to birth Community/Connection: Family Patient Stress Factors: Other (Comment) (transitioning to end-of-life) Family Stress Factors: Major life changes, Other (Comment) (anticipatory grief as they prepare for Dean [name]'s passing)   GOALS   Clinical Care Goals: Provide spiritual/emotional support and pastoral presence as family gathers and reflects   INTERVENTIONS   Spiritual Care Interventions Made: Established relationship of care and support, Compassionate presence, Reflective listening, Normalization of emotions, Explored values/beliefs/practices/strengths   INTERVENTION OUTCOMES   Outcomes: Connection to values and goals of care, Awareness of support  SPIRITUAL CARE PLAN   Spiritual Care Issues Still Outstanding: Referring to oncoming chaplain for further support Recommendations for  Clinical Staff: Brother Dean [name] is less familiar with dying process Follow up plan : Family aware of 24/7 chaplain availability; referred to [facility] chaplain for further support    Chaplain Olam Filiberto Lemming, [facility] 24/7 pager [phone] Voicemail [phone]

## 2024-12-19 NOTE — Progress Notes (Signed)
 I met with Johnson's family at bedside.  They were grateful that he is comfortable and are surrounding him with love.  I answered questions about next steps after he passes and provided emotional and spiritual support through listening and being present with the family.

## 2024-12-19 NOTE — Progress Notes (Signed)
 Daily Progress Note   Patient Name: Dean [name].       Date: 12-06-2024 DOB: [birth year]  Age: 79 y.o. MRN#: [id number] Attending Physician: Rizwan, Saima, MD Primary Care Physician: Shepard Ade, MD Admit Date: 11/29/2024 Length of Stay: 5 days  Reason for Consultation/Follow-up: Establishing goals of care  Subjective:   Reviewed EMR including recent documentation from hospitalist and PCCM provider.  Unfortunately despite continued medical management, patient's medical status has deteriorated.  Patient with increasing oxygen  requirements up on HHFNC with FiO2 flow rate of 60 L/min and FiO2 80%.  SLP evaluation for FEES study canceled in the setting of worsening respiratory and mental status.  Discussed care with bedside RN for medical updates.  ------------------------------------------------------------------------------------------------------------- Advance Care Planning Conversation  Pertinent diagnosis: Mixed type COPD, acute hypoxic respiratory failure, pulmonary embolism, left lower lobe pneumonia, severe OSA, tobacco use, electrolyte abnormalities  The patient and/or family consented to a voluntary Advance Care Planning Conversation in person. Individuals present for the conversation: This provider discussed care with patient and patient's NOK at bedside including his brother and sister.  Summary of the conversation:  Presented to bedside to see patient.  Patient's brother and sister both present at bedside.  Again introduced myself as a member of the palliative medicine team.  Patient laying in bed with noted increased work of breathing.  Was able to interact with patient though he was very lethargic and quickly falling back asleep.  Spent time discussing that despite appropriate medical interventions, patient's medical status has continued to deteriorate.  Patient acknowledges this and noted feeling worsening shortness of breath.  Discussed with this occurring, would now  consider it being the time to transition to comfort focused care to focus on patient's symptom management at end-of-life.  Spent time explaining what this would entail such as on infusion of IV medications to help with shortness of breath and medications for agitation.  Discussed that once patient was comfortable enough, would remove HHFNC and allow patient to die naturally and peacefully.  Patient and family acknowledging this and agreeing with transition to comfort focused care at this time.  Spent time answering questions as able.  Provided emotional support via active listening.  Outcome of the conversations and/or documents completed:  Transition to full comfort focused care at this time.  Plan to remove HHFNC once patient is comfortable.  I spent 30 minutes providing separately identifiable ACP services with the patient and/or surrogate decision maker in a voluntary, in-person conversation discussing the patient's wishes and goals as detailed in the above note.  Tinnie Radar, DO Palliative Medicine Provider  -------------------------------------------------------------------------------------------------------------  Discussed care with hospitalist, bedside RN, chaplain, and PCCM provider after visit to coordinate transition to full comfort focused care at this time.  Objective:   Vital Signs:  BP (!) 173/83   Pulse 84   Temp (!) 97.4 F (36.3 C) (Axillary)   Resp 18   Ht 5' 5 (1.651 m)   Wt 67.4 kg   SpO2 95%   BMI 24.73 kg/m   Physical Exam: General: Awake though very fatigued, chronically ill-appearing, frail Cardiovascular: RRR Respiratory: on HHFNC 60 L/min at FiO2 80 %, increased work of breathing noted Abdomen: not distended Neuro: Awake though very fatigued  Assessment & Plan:   Assessment: Patient is a 79 year old male with a past medical history of AAA, osteoarthritis of the hip, DDD of the cervical and lumbar spine, CAD s/p PCI, stage III CKD, diverticulosis,  mixed type COPD with chronic dyspnea, GERD/hiatal  hernia, hypertension, hyperlipidemia, obstructive sleep apnea, PAD, parotid mass, PVCs, and renal artery stenosis who was admitted on 11/29/2024 for management of generalized weakness and dyspnea.  Patient was found to have bilateral central pulmonary emboli, left greater than right, extending to the segmental and subsegmental levels of the left lower, left upper, right middle, and right lower lobes.  Patient also receiving workup for concerns related to dysphagia.  Palliative medicine team consulted to assist with complex medical decision making.  Recommendations/Plan: # Complex medical decision making/goals of care:  - Discussed care with patient, patient's brother, and patient's sister as detailed above in HPI.  Despite appropriate medical interventions, patient's medical status has continued to deteriorate.  Discussed with patient and family at bedside and patient and family agreeing with transition to comfort focused care at this time.  Continuing symptom management at end-of-life with anticipated in-hospital death.  Palliative medicine team continuing to follow along with patient's medical journey.  -At this time we will discontinue interventions that are no longer focused on comfort such as IV fluids, imaging, or lab work.  Will instead focus on symptom management of pain, dyspnea, and agitation in the setting of end-of-life care.    Code Status: Do not attempt resuscitation (DNR) - Comfort care  # Symptom management Patient is receiving these palliative interventions for symptom management with an intent to improve quality of life.     -Pain/Dyspnea, acute in the setting of end-of-life care                               -Start IV hydromorphone  infusion with titratable dosing from 0.5-10mg /hr. start bolus dosing from infusion at 0.5-2 milligram every 15 minute as needed. Continue to adjust based on patient's symptom burden.                  -Anxiety/agitation, in the setting of end-of-life care                               -Start IV Ativan  1 mg every 4 hours as needed. Continue to adjust based on patient's symptom burden.                                 -Start IV Haldol  1-2 mg every 4 hours as needed. Continue to adjust based on patient's symptom burden.                   -Secretions, in the setting of end-of-life care                               -Start IV glycopyrrolate  0.2 mg every 4 hours as needed.  # Psychosocial Support:  - Brother, sister  # Discharge Planning: Anticipated Hospital Death  Discussed with: Patient, patient's brother and sister, PCCM, RN, hospitalist, SLP, chaplain  Thank you for allowing the palliative care team to participate in the care [name].SABRA Tinnie Radar, DO Palliative Care Provider PMT # [phone]  If patient remains symptomatic despite maximum doses, please call PMT at [phone] between 0700 and 1900. Outside of these hours, please call attending, as PMT does not have night coverage.  Billing based on MDM: High  Problems Addressed: One or more chronic illnesses with severe exacerbation,  progression, or side effects of treatment.  Risks: Parenteral controlled substances

## 2024-12-19 NOTE — Progress Notes (Signed)
 Nurse stated that she would let me know when the Pt was comfortable enough to take him off HHFNC.

## 2024-12-19 NOTE — Progress Notes (Signed)
 OT Discharge Note  Patient Details Name: Dean [name]. MRN: [id number] DOB: [birth year]   Cancelled Treatment:    Reason Eval/Treat Not Completed: Other (comment) Patient now comfort care, OT will respectfully sign off at this time. It was a pleasure being a part of Mr. Coakley' treatment team.    Ronal Gift E. Blaize Epple, OTR/L Acute Rehabilitation Services [phone]   Ronal Gift Salt December 27, 2024, 10:45 AM

## 2024-12-19 NOTE — Progress Notes (Signed)
 Physical Therapy Discharge Patient Details Name: Dean [name]. MRN: [id number] DOB: 05-31-1946 Today's Date: 2024-12-07 Time:  -     Patient discharged from PT services secondary to medical decline - Patient now on Comfort care. Please see latest therapy progress note for current level of functioning and progress toward goals.    Progress and discharge plan discussed with patient and/or caregiver: NA  GP    Darice Potters PT Acute Rehabilitation Services Office [phone]   Potters Darice Norris 12/07/24, 10:49 AM

## 2024-12-19 NOTE — Progress Notes (Signed)
 NAME:  Mackey Varricchio., MRN:  [id number], DOB:  03-09-1946, LOS: 5 ADMISSION DATE:  11/29/2024, CONSULTATION DATE:  December 11, 2024 REFERRING MD:  Dr. Celinda, CHIEF COMPLAINT:  PE   History of Present Illness:  79 year old male with past medical history as below, which is significant for coronary artery disease, stage III CKD, COPD, OSA on CPAP, renal artery stenosis, AAA, and GI bleed.  Who presented to [facility] emergency department 12/12 with complaints of generalized weakness and dyspnea.  Evaluation in the emergency department included chest x-ray which showed cardiomegaly and chronic interstitial prominence.  CT angiogram of the chest demonstrated large left-sided pulmonary embolism and smaller pulmonary embolism on the right.  RV LV ratio 0.9.  He was admitted to the hospital service on a heparin  infusion.  In the evening hours patient began to have increasing oxygen  requirement and PCCM was asked to evaluate.  Pertinent  Medical History   has a past medical history of AAA (abdominal aortic aneurysm) (2021), Arthritis, Benign localized prostatic hyperplasia with lower urinary tract symptoms (LUTS), CAD (coronary artery disease) (1994), Chronic stable angina (2005), CKD (chronic kidney disease), stage III (HCC), Complication of anesthesia, COPD mixed type (HCC), DDD (degenerative disc disease), cervical, Diverticulosis of colon, Dyspnea, Edema of both lower extremities, GERD (gastroesophageal reflux disease), Hiatal hernia, History of adenomatous polyp of colon, History of kidney stones, History of lower GI bleeding (02/2022), History of melanoma, History of prediabetes, History of tuberculosis (1966), Hyperlipidemia, Hypertension, Neuromuscular disorder (HCC), OSA on CPAP (2000), PAD (peripheral artery disease), Parotid mass, Peripheral vascular disease, PVC's (premature ventricular contractions), Renal artery stenosis, and S/P primary angioplasty with coronary stent (1994).   Significant  Hospital Events: Including procedures, antibiotic start and stop dates in addition to other pertinent events   12/12 admit to [facility] for high risk PE 12/13 increased hypoxia requiring HFNC, evolving left lower lobe infiltrate 12/15 on high flow with cough and phlegm yellow.  Starting HAP coverage antibiotics. 2024/12/11 worsening oxygenation now on 90% FiO2.   Interim History / Subjective:  Worsening oxygen  now on 90% FiO2.  Very weak with poor prognosis.  However she is alert and oriented and says that he is not coughing as much.  Objective    Blood pressure (!) 127/46, pulse 70, temperature (!) 97.4 F (36.3 C), temperature source Axillary, resp. rate 15, height 5' 5 (1.651 m), weight 67.4 kg, SpO2 (!) 88%.    FiO2 (%):  [60 %-100 %] 80 %   Intake/Output Summary (Last 24 hours) at 12-11-2024 0913 Last data filed at 11-Dec-2024 0700 Gross per 24 hour  Intake 1441.07 ml  Output 1175 ml  Net 266.07 ml   Filed Weights   11/29/24 1314  Weight: 67.4 kg   Examination: General: Elderly male appearing weak. Lungs: Prolonged expiratory breath sounds on posterior lung field auscultation on the left. Heart: regular rate rhythm, no murmur appreciated.  Difficult to evaluate JVD. Abdomen: non tender, non distended. Normal BS.  Neuro: All oriented x 3.  Moving all 4 extremities but significantly weak. No significant peripheral pedal edema.   Resolved problem list   Assessment and Plan   Acute Hypoxic Respiratory Failure  Pulmonary Embolism:  LLL PNA: CT chest reviewed by me with distal left main PA blood clot traversing in the left upper and lower lobe along with right sided segmental and subsegmental blood clot.  There is evolving left lower lobe infiltrate with air bronchograms concerning for pneumonia though this could be secondary to atelectasis versus  early infarct. CXR>worsening bibasilar opacities.  Spirometry 2018: COPD with FEV1 38% predicted.  Significant clot burden on the left,  lesser burden on the right. No RV strain on CT. Pro BNP 9331, Trop 132. No lactic sent. PESI class 5, BOVA 4. Very high risk. RV/LV ratio 0.9. Pro BNP improving without diuretic.  POCUS shows normal RV size and function. Formal echocardiogram shows normal RV size and function. -Heparin  drip> now Lovenox . - Continue ICS/LAMA/LABA.  On Stiolto at home.  May need to transition to Trelegy on discharge. - Albuterol + hypertonic saline every 8 hours scheduled along with chest PT every 8 hours. - Wean O2 as tolerated.  -With worsening hypoxemia though clinically does not appear fluid overloaded will start patient on Lasix . - Starting Vanco and cefepime > DC Vanco MRSA negative.  RVP negative sputum culture pending.  -No shunt seen on bubble study.  Severe OSA on CPAP>repeat PSG w/o OSA.  - cont w HFNC at night.   COPD without exacerbation - ICS/LAMA/LABA with Budesonide /Yupelri /Brovana  -Will initiate steroids today with worsening hypoxemia though seems less likely to be COPD exacerbation  Pulmonary will follow.  Palliative care involved.  Patient has poor prognosis and high risk for decompensation.  Family including sister and brother Herb is aware  Labs   CBC: Recent Labs  Lab 11/29/24 0701 11/30/24 0249 12/01/24 0304 12/02/24 0302 12/03/24 0306 December 11, 2024 0302  WBC 8.6 8.9 9.6 10.4 9.0 8.0  NEUTROABS 6.5  --  7.6 8.4* 7.1  --   HGB 14.5 13.6 13.7 12.3* 11.7* 12.1*  HCT 44.6 42.4 43.5 38.5* 36.8* 38.0*  MCV 97.2 96.1 97.8 98.0 99.2 99.7  PLT 202 187 212 186 165 187    Basic Metabolic Panel: Recent Labs  Lab 11/29/24 0701 11/29/24 0925 11/30/24 0249 11/30/24 0810 12/01/24 0304 12/02/24 0302 12/03/24 0306  NA 146*  --  143  --  143 143 143  K 2.9*  --  2.9*  --  3.5 3.7 4.0  CL 101  --  101  --  103 106 106  CO2 29  --  28  --  27 29 26   GLUCOSE 72  --  69*  --  111* 99 71  BUN 15  --  16  --  18 19 22   CREATININE 1.74*  --  1.53*  --  1.85* 1.61* 1.59*  CALCIUM  8.9  --   8.3*  --  8.5* 8.4* 8.5*  MG  --  1.8  --  2.3 2.3 2.1 2.1  PHOS  --  3.4  --  3.5 3.7 2.8 3.1   GFR: Estimated Creatinine Clearance: 33.8 mL/min (A) (by C-G formula based on SCr of 1.59 mg/dL (H)). Recent Labs  Lab 12/01/24 0304 12/02/24 0302 12/03/24 0306 2024-12-11 0302  WBC 9.6 10.4 9.0 8.0    Liver Function Tests: Recent Labs  Lab 11/29/24 0701 11/30/24 0249 12/01/24 0304 12/02/24 0302 12/03/24 0306  AST 30 26 23 20 22   ALT 7 7 6 6 8   ALKPHOS 118 109 108 91 94  BILITOT 0.6 0.6 0.6 0.6 0.5  PROT 7.2 6.8 6.7 6.2* 6.2*  ALBUMIN 3.5 3.3* 3.2* 2.9* 3.0*   No results for input(s): LIPASE, AMYLASE in the last 168 hours. No results for input(s): AMMONIA in the last 168 hours.  ABG    Component Value Date/Time   TCO2 26 01/03/2023 0653     Coagulation Profile: No results for input(s): INR, PROTIME in the last 168 hours.  Cardiac Enzymes:  No results for input(s): CKTOTAL, CKMB, CKMBINDEX, TROPONINI in the last 168 hours.  HbA1C: Hgb A1c MFr Bld  Date/Time Value Ref Range Status  11/05/2019 10:49 AM 6.6 (H) 4.8 - 5.6 % Final    Comment:    (NOTE) Pre diabetes:          5.7%-6.4% Diabetes:              >6.4% Glycemic control for   <7.0% adults with diabetes     CBG: Recent Labs  Lab 12/03/24 1518 12/03/24 1937 12/03/24 2351 Dec 09, 2024 0353 12/09/24 0750  GLUCAP 71 87 91 90 82    Review of Systems:   Not obtained  Past Medical History:  He,  has a past medical history of AAA (abdominal aortic aneurysm) (2021), Arthritis, Benign localized prostatic hyperplasia with lower urinary tract symptoms (LUTS), CAD (coronary artery disease) (1994), Chronic stable angina (2005), CKD (chronic kidney disease), stage III (HCC), Complication of anesthesia, COPD mixed type (HCC), DDD (degenerative disc disease), cervical, Diverticulosis of colon, Dyspnea, Edema of both lower extremities, GERD (gastroesophageal reflux disease), Hiatal hernia, History of  adenomatous polyp of colon, History of kidney stones, History of lower GI bleeding (02/2022), History of melanoma, History of prediabetes, History of tuberculosis (1966), Hyperlipidemia, Hypertension, Neuromuscular disorder (HCC), OSA on CPAP (2000), PAD (peripheral artery disease), Parotid mass, Peripheral vascular disease, PVC's (premature ventricular contractions), Renal artery stenosis, and S/P primary angioplasty with coronary stent (1994).   Surgical History:   Past Surgical History:  Procedure Laterality Date   APPENDECTOMY  1974   CATARACT EXTRACTION W/ INTRAOCULAR LENS IMPLANT Bilateral 2019   CHOLECYSTECTOMY, LAPAROSCOPIC     1990s   COLONOSCOPY WITH PROPOFOL   04/27/2022   dr abran   CORONARY ANGIOPLASTY WITH STENT PLACEMENT  1994   CYSTOSCOPY WITH INSERTION OF UROLIFT N/A 04/14/2020   Procedure: CYSTOSCOPY WITH INSERTION OF UROLIFT;  Surgeon: Nieves Cough, MD;  [facility];  Service: Urology;  Laterality: N/A;   CYSTOSCOPY WITH STENT PLACEMENT Right 07/07/2020   Procedure: CYSTOSCOPY WITH STENT CHANGE;  Surgeon: Nieves Cough, MD;  Location[facility];  Service: Urology;  Laterality: Right;   CYSTOSCOPY WITH URETEROSCOPY AND STENT PLACEMENT Bilateral 01/03/2020   Procedure: CYSTOSCOPY WITH RETROGRADE/ URETEROSCOPY AND RIGHT STENT PLACEMENT BLADDER BIOPSY;  Surgeon: Nieves Cough, MD;  Location: [facility];  Service: Urology;  Laterality: Bilateral;  ONLY NEEDS 30 MIN   CYSTOSCOPY WITH URETEROSCOPY AND STENT PLACEMENT Right 04/14/2020   Procedure: CYSTOSCOPY WITH URETEROSCOPY, RETROGRADE  AND STENT REPLACEMENT;  Surgeon: Nieves Cough, MD;  Location: [facility];  Service: Urology;  Laterality: Right;   CYSTOSCOPY/URETEROSCOPY/HOLMIUM LASER/STENT PLACEMENT  05/18/2005   @[facility]  by dr andra OCTAVE BYPASS GRAFT  08/31/2011   @[facility]  by dr gerlean;  right to left   ILIAC ARTERY STENT  Bilateral    1990s;   bilateral common iliac artery stenting   LAPAROSCOPY ABDOMEN DIAGNOSTIC  12/03/2008   @[facility]  by dr ebbie;   Lap. lysis adhesions/  open umbilical hernia repair   LUMBAR LAMINECTOMY  09/07/2004   @[facility]  by dr pool   RENAL ANGIOGRAPHY N/A 02/14/2020   Procedure: RENAL ANGIOGRAPHY;  Surgeon: Harvey Carlin BRAVO, MD;  Location: [facility] INVASIVE CV LAB;  Service: Cardiovascular;  Laterality: N/A;  Bilateral   ROBOT ASSISTED PYELOPLASTY Right 11/20/2020   Procedure: XI ROBOTIC RIGHT ASSISTED PYELOPLASTY AND STENT PLACEMENT;  Surgeon: Cam Morene ORN, MD;  Location: [facility];  Service: Urology;  Laterality: Right;   TONSILLECTOMY     child   TOTAL HIP ARTHROPLASTY Right 07/15/2020   Procedure: TOTAL HIP ARTHROPLASTY ANTERIOR APPROACH;  Surgeon: Melodi Lerner, MD;  Location: [facility];  Service: Orthopedics;  Laterality: Right;    UMBILICAL HERNIA REPAIR  2004     Social History:   reports that he has been smoking cigarettes. He has a 76.5 pack-year smoking history. He has never been exposed to tobacco smoke. He has never used smokeless tobacco. He reports that he does not currently use alcohol . He reports that he does not use drugs.   Family History:  His family history includes Breast cancer in his sister; Cancer in his mother and sister; Diabetes in his father; Hyperlipidemia in his brother, mother, and sister; Hypertension in his brother, father, mother, and sister; Kidney cancer in his mother; Other in his father; Prostate cancer in his brother. There is no history of Colon cancer, Colon polyps, Esophageal cancer, Rectal cancer, or Stomach cancer.   Allergies Allergies[1]   Home Medications  Prior to Admission medications  Medication Sig Start Date End Date Taking? Authorizing Provider  albuterol  (VENTOLIN  HFA) 108 (90 Base) MCG/ACT inhaler INHALE 1 PUFF BY MOUTH INTO THE LUNGS TWICE DAILY AS NEEDED FOR WHEEZING OR SHORTNESS OF BREATH 08/08/24   Neysa Rama D, MD   antiseptic oral rinse (BIOTENE) LIQD 15 mLs by Mouth Rinse route daily as needed for dry mouth.     [provider]  aspirin  EC 81 MG tablet Take 81 mg by mouth daily. Swallow whole.    [provider]  Azelastine -Fluticasone  137-50 MCG/ACT SUSP Place 1-2 sprays into both nostrils at bedtime. 05/10/23   Neysa Rama D, MD  Cholecalciferol (VITAMIN D-3 PO) Take 1 capsule by mouth every other day.    [provider]  diltiazem  (DILT-XR) 120 MG 24 hr capsule Take 1 capsule (120 mg total) by mouth daily. 04/13/23   Hilty, Vinie BROCKS, MD  docusate sodium  (COLACE) 100 MG capsule Take 100 mg by mouth daily.    [provider]  esomeprazole (NEXIUM) 40 MG capsule Take 40 mg by mouth daily as needed (acid reflux/indigestion.). 02/07/19   [provider]  finasteride  (PROSCAR ) 5 MG tablet Take 1 tablet by mouth at bedtime.    [provider]  furosemide  (LASIX ) 20 MG tablet TAKE 1 TABLET(20 MG) BY MOUTH DAILY AS NEEDED 05/28/24   Hilty, Vinie BROCKS, MD  irbesartan  (AVAPRO ) 150 MG tablet Take 150 mg by mouth every evening. 11/11/19   [provider]  isosorbide  mononitrate (IMDUR ) 60 MG 24 hr tablet Take 60 mg by mouth every evening.    [provider]  loperamide  (IMODIUM  A-D) 2 MG tablet Take 2 mg by mouth as needed.    [provider]  MAGNESIUM -OXIDE 400 (240 Mg) MG tablet Take 400 mg by mouth every other day.    [provider]  Menthol , Topical Analgesic, (BLUE-EMU MAXIMUM STRENGTH EX) Apply 1 application topically 3 (three) times daily as needed (pain.).    [provider]  Polyethyl Glycol-Propyl Glycol (SYSTANE ULTRA OP) Place 1 drop into both eyes daily as needed (Dry eye).    [provider]  potassium chloride  (KLOR-CON  M) 10 MEQ tablet Take 1 tablet on the days Lasix  is taken. 04/13/23   Hilty, Vinie BROCKS, MD  rosuvastatin (CRESTOR) 10 MG tablet Take 10 mg by mouth daily.    [provider]  simethicone  (MYLICON) 125 MG chewable tablet  Chew 125 mg by mouth every 6 (six) hours as needed for flatulence. Takes gas x    [provider]  Tiotropium Bromide-Olodaterol (STIOLTO RESPIMAT ) 2.5-2.5 MCG/ACT AERS Inhale 2 puffs into the lungs daily. 06/04/24   Neysa Reggy BIRCH, MD  traMADol  (ULTRAM ) 50 MG tablet Take 1 tablet by mouth 4 (four) times daily as needed. 03/21/22   [provider]     CRITICAL CARE Performed by: Sammi BIRCH Fredericks.     Total critical care time: 31 minutes   Critical care time was exclusive of separately billable procedures and treating other patients.   Critical care was necessary to treat or prevent imminent or life-threatening deterioration.   Critical care was time spent personally by me on the following activities: development of treatment plan with patient and/or surrogate as well as nursing, discussions with consultants, evaluation of patient's response to treatment, examination of patient, obtaining history from patient or surrogate, ordering and performing treatments and interventions, ordering and review of laboratory studies, ordering and review of radiographic studies, pulse oximetry, re-evaluation of patient's condition and participation in multidisciplinary rounds.  Sammi BIRCH Fredericks, MD Pulmonary, Critical Care and Sleep Attending.   2024-12-26, 9:13 AM               [1]  Allergies Allergen Reactions   Penicillins     Childhood reaction  REACTION: rapid pulse Did it involve swelling of the face/tongue/throat, SOB, or low BP? Yes Did it involve sudden or severe rash/hives, skin peeling, or any reaction on the inside of your mouth or nose? Unknown Did you need to seek medical attention at a hospital or doctor's office? yes When did it last happen?      childhood  If all above answers are NO, may proceed with cephalosporin use.    Fish Oil     Other reaction(s): Heart attack symptoms   Other     Oily seafood  salmon-- chest pain   Penicillin G     Other reaction(s): Unknown   Mold Extract [Trichophyton] Rash

## 2024-12-19 NOTE — Progress Notes (Signed)
 SLP Cancellation Note  Patient Details Name: Dean [name]. MRN: [id number] DOB: [birth year]   Cancelled treatment:       Reason Eval/Treat Not Completed: Medical issues which prohibited therapy. Patient's oxygen  requirement has increased to 60L with FiO2 of 80%. Planned FEES swallow test to be cancelled. SLP will continue to follow, pending GOC decisions.    Norleen IVAR Blase, MA, CCC-SLP Speech Therapy  12-07-2024, 9:10 AM

## 2024-12-19 DEATH — deceased
